# Patient Record
Sex: Female | Born: 1945 | Race: White | Hispanic: No | Smoking: Never smoker
Health system: Southern US, Community
[De-identification: ages and names within clinical notes are randomized; demographics above are authoritative.]

## PROBLEM LIST (undated history)

## (undated) DIAGNOSIS — E785 Hyperlipidemia, unspecified: Secondary | ICD-10-CM

## (undated) DIAGNOSIS — K219 Gastro-esophageal reflux disease without esophagitis: Secondary | ICD-10-CM

## (undated) DIAGNOSIS — I10 Essential (primary) hypertension: Secondary | ICD-10-CM

## (undated) DIAGNOSIS — M810 Age-related osteoporosis without current pathological fracture: Secondary | ICD-10-CM

## (undated) HISTORY — DX: Hyperlipidemia, unspecified: E78.5

## (undated) HISTORY — DX: Gastro-esophageal reflux disease without esophagitis: K21.9

## (undated) HISTORY — DX: Age-related osteoporosis without current pathological fracture: M81.0

---

## 2008-08-15 ENCOUNTER — Encounter: Admission: RE | Admit: 2008-08-15 | Discharge: 2008-10-18 | Payer: Self-pay | Admitting: Orthopedic Surgery

## 2011-09-02 ENCOUNTER — Encounter (HOSPITAL_COMMUNITY): Payer: Self-pay

## 2011-09-02 ENCOUNTER — Emergency Department (HOSPITAL_COMMUNITY): Payer: Medicare Other

## 2011-09-02 ENCOUNTER — Inpatient Hospital Stay (HOSPITAL_COMMUNITY)
Admission: EM | Admit: 2011-09-02 | Discharge: 2011-09-05 | DRG: 470 | Disposition: A | Discharge status: Home Health | Payer: Medicare Other | Attending: Orthopedic Surgery | Admitting: Orthopedic Surgery

## 2011-09-02 ENCOUNTER — Other Ambulatory Visit: Payer: Self-pay

## 2011-09-02 DIAGNOSIS — Y9367 Activity, basketball: Secondary | ICD-10-CM

## 2011-09-02 DIAGNOSIS — I10 Essential (primary) hypertension: Secondary | ICD-10-CM | POA: Diagnosis present

## 2011-09-02 DIAGNOSIS — S72002A Fracture of unspecified part of neck of left femur, initial encounter for closed fracture: Secondary | ICD-10-CM

## 2011-09-02 DIAGNOSIS — Z96649 Presence of unspecified artificial hip joint: Secondary | ICD-10-CM

## 2011-09-02 DIAGNOSIS — W010XXA Fall on same level from slipping, tripping and stumbling without subsequent striking against object, initial encounter: Secondary | ICD-10-CM | POA: Diagnosis present

## 2011-09-02 DIAGNOSIS — D62 Acute posthemorrhagic anemia: Secondary | ICD-10-CM | POA: Diagnosis not present

## 2011-09-02 DIAGNOSIS — E119 Type 2 diabetes mellitus without complications: Secondary | ICD-10-CM | POA: Diagnosis present

## 2011-09-02 DIAGNOSIS — S72009A Fracture of unspecified part of neck of unspecified femur, initial encounter for closed fracture: Principal | ICD-10-CM | POA: Diagnosis present

## 2011-09-02 HISTORY — DX: Essential (primary) hypertension: I10

## 2011-09-02 LAB — BASIC METABOLIC PANEL
BUN: 10 mg/dL (ref 6–23)
CO2: 29 mEq/L (ref 19–32)
Calcium: 9.5 mg/dL (ref 8.4–10.5)
Chloride: 104 mEq/L (ref 96–112)
Creatinine, Ser: 0.58 mg/dL (ref 0.50–1.10)
GFR calc Af Amer: >90 mL/min (ref >90)
GFR calc non Af Amer: >90 mL/min (ref >90)
Glucose, Bld: 161 mg/dL — ABNORMAL HIGH (ref 70–99)
Potassium: 3.2 mEq/L — ABNORMAL LOW (ref 3.5–5.1)
Sodium: 142 mEq/L (ref 135–145)

## 2011-09-02 LAB — CBC
HCT: 36.3 % (ref 36.0–46.0)
Hemoglobin: 12.6 g/dL (ref 12.0–15.0)
MCH: 32.1 pg (ref 26.0–34.0)
MCHC: 34.7 g/dL (ref 30.0–36.0)
MCV: 92.6 fL (ref 78.0–100.0)
Platelets: 147 10*3/uL — ABNORMAL LOW (ref 150–400)
RBC: 3.92 MIL/uL (ref 3.87–5.11)
RDW: 13 % (ref 11.5–15.5)
WBC: 11.9 10*3/uL — ABNORMAL HIGH (ref 4.0–10.5)

## 2011-09-02 MED ORDER — MORPHINE SULFATE 4 MG/ML IJ SOLN
4.0000 mg | Freq: Once | INTRAMUSCULAR | Status: AC
  Administered 2011-09-02: 4 mg via INTRAVENOUS
  Filled 2011-09-02: qty 1

## 2011-09-02 MED ORDER — ONDANSETRON HCL 4 MG/2ML IJ SOLN
4.0000 mg | Freq: Once | INTRAMUSCULAR | Status: AC
  Administered 2011-09-02: 4 mg via INTRAVENOUS
  Filled 2011-09-02: qty 2

## 2011-09-02 NOTE — ED Notes (Signed)
States she was playing basketball with her grandson

## 2011-09-03 ENCOUNTER — Inpatient Hospital Stay (HOSPITAL_COMMUNITY): Payer: Medicare Other

## 2011-09-03 ENCOUNTER — Encounter (HOSPITAL_COMMUNITY): Payer: Self-pay | Admitting: Anesthesiology

## 2011-09-03 ENCOUNTER — Other Ambulatory Visit: Payer: Self-pay

## 2011-09-03 ENCOUNTER — Encounter (HOSPITAL_COMMUNITY)
Admission: EM | Disposition: A | Discharge status: Home Health | Payer: Self-pay | Source: Home / Self Care | Attending: Orthopedic Surgery

## 2011-09-03 ENCOUNTER — Inpatient Hospital Stay (HOSPITAL_COMMUNITY): Payer: Medicare Other | Admitting: Anesthesiology

## 2011-09-03 ENCOUNTER — Encounter (HOSPITAL_COMMUNITY): Payer: Self-pay | Admitting: *Deleted

## 2011-09-03 DIAGNOSIS — Z96649 Presence of unspecified artificial hip joint: Secondary | ICD-10-CM

## 2011-09-03 HISTORY — PX: TOTAL HIP ARTHROPLASTY: SHX124

## 2011-09-03 LAB — GLUCOSE, CAPILLARY
Glucose-Capillary: 215 mg/dL — ABNORMAL HIGH (ref 70–99)
Glucose-Capillary: 256 mg/dL — ABNORMAL HIGH (ref 70–99)
Glucose-Capillary: 412 mg/dL — ABNORMAL HIGH (ref 70–99)

## 2011-09-03 LAB — URINALYSIS, ROUTINE W REFLEX MICROSCOPIC
Glucose, UA: >1000 mg/dL — AB
Leukocytes, UA: NEGATIVE
Protein, ur: NEGATIVE mg/dL
Specific Gravity, Urine: 1.036 — ABNORMAL HIGH (ref 1.005–1.030)
Urobilinogen, UA: 0.2 mg/dL (ref 0.0–1.0)

## 2011-09-03 LAB — COMPREHENSIVE METABOLIC PANEL
ALT: 22 U/L (ref 0–35)
Albumin: 3.6 g/dL (ref 3.5–5.2)
Calcium: 9.5 mg/dL (ref 8.4–10.5)
GFR calc Af Amer: >90 mL/min (ref >90)
Glucose, Bld: 259 mg/dL — ABNORMAL HIGH (ref 70–99)
Potassium: 4.4 mEq/L (ref 3.5–5.1)
Sodium: 140 mEq/L (ref 135–145)
Total Protein: 6.4 g/dL (ref 6.0–8.3)

## 2011-09-03 LAB — SURGICAL PCR SCREEN: MRSA, PCR: NEGATIVE

## 2011-09-03 LAB — ABO/RH
ABO/RH(D): O POS
ABO/RH(D): O POS

## 2011-09-03 LAB — TYPE AND SCREEN
ABO/RH(D): O POS
Antibody Screen: NEGATIVE

## 2011-09-03 LAB — CBC
Hemoglobin: 12.6 g/dL (ref 12.0–15.0)
MCH: 33.2 pg (ref 26.0–34.0)
MCHC: 35.4 g/dL (ref 30.0–36.0)
Platelets: 137 10*3/uL — ABNORMAL LOW (ref 150–400)
RDW: 13 % (ref 11.5–15.5)

## 2011-09-03 SURGERY — ARTHROPLASTY, HIP, TOTAL,POSTERIOR APPROACH
Anesthesia: General | Site: Hip | Laterality: Left | Wound class: Clean

## 2011-09-03 MED ORDER — ACETAMINOPHEN 650 MG RE SUPP: 650.0000 mg | Freq: Four times a day (QID) | RECTAL | Status: DC | PRN

## 2011-09-03 MED ORDER — INSULIN ASPART 100 UNIT/ML ~~LOC~~ SOLN
10.0000 [IU] | Freq: Once | SUBCUTANEOUS | Status: AC
  Administered 2011-09-03: 5 [IU] via SUBCUTANEOUS
  Filled 2011-09-03: qty 3

## 2011-09-03 MED ORDER — METOCLOPRAMIDE HCL 10 MG PO TABS: 5.0000 mg | ORAL_TABLET | Freq: Three times a day (TID) | ORAL | Status: DC | PRN

## 2011-09-03 MED ORDER — METHOCARBAMOL 500 MG PO TABS
500.0000 mg | ORAL_TABLET | Freq: Four times a day (QID) | ORAL | Status: DC | PRN
  Administered 2011-09-04: 500 mg via ORAL
  Filled 2011-09-03: qty 1

## 2011-09-03 MED ORDER — MIDAZOLAM HCL 5 MG/5ML IJ SOLN
INTRAMUSCULAR | Status: DC | PRN
  Administered 2011-09-03: 2 mg via INTRAVENOUS

## 2011-09-03 MED ORDER — FENTANYL CITRATE 0.05 MG/ML IJ SOLN
INTRAMUSCULAR | Status: DC | PRN
  Administered 2011-09-03 (×5): 50 ug via INTRAVENOUS

## 2011-09-03 MED ORDER — METHOCARBAMOL 100 MG/ML IJ SOLN
500.0000 mg | Freq: Four times a day (QID) | INTRAVENOUS | Status: DC | PRN
  Filled 2011-09-03: qty 5

## 2011-09-03 MED ORDER — ACETAMINOPHEN 325 MG PO TABS
650.0000 mg | ORAL_TABLET | Freq: Four times a day (QID) | ORAL | Status: DC | PRN
  Filled 2011-09-03: qty 2

## 2011-09-03 MED ORDER — HETASTARCH-ELECTROLYTES 6 % IV SOLN
INTRAVENOUS | Status: DC | PRN
  Administered 2011-09-03: 15:00:00 via INTRAVENOUS

## 2011-09-03 MED ORDER — SODIUM CHLORIDE 0.9 % IV SOLN
100.0000 mL/h | INTRAVENOUS | Status: DC
  Administered 2011-09-03 – 2011-09-04 (×2): 100 mL/h via INTRAVENOUS
  Filled 2011-09-03 (×9): qty 1000

## 2011-09-03 MED ORDER — LORAZEPAM 0.5 MG PO TABS
0.5000 mg | ORAL_TABLET | Freq: Two times a day (BID) | ORAL | Status: DC
Start: 2011-09-03 — End: 2011-09-05
  Administered 2011-09-03 – 2011-09-04 (×2): 0.5 mg via ORAL
  Filled 2011-09-03 (×2): qty 1

## 2011-09-03 MED ORDER — MENTHOL 3 MG MT LOZG
1.0000 | LOZENGE | OROMUCOSAL | Status: DC | PRN
  Filled 2011-09-03: qty 9

## 2011-09-03 MED ORDER — METOPROLOL TARTRATE 50 MG PO TABS
50.0000 mg | ORAL_TABLET | Freq: Every morning | ORAL | Status: DC
  Administered 2011-09-04 – 2011-09-05 (×2): 50 mg via ORAL
  Filled 2011-09-03 (×3): qty 1

## 2011-09-03 MED ORDER — HYDROMORPHONE HCL PF 1 MG/ML IJ SOLN
INTRAMUSCULAR | Status: AC
  Filled 2011-09-03: qty 1

## 2011-09-03 MED ORDER — CEFAZOLIN SODIUM 1-5 GM-% IV SOLN
1.0000 g | INTRAVENOUS | Status: AC
  Administered 2011-09-03: 1 g via INTRAVENOUS
  Filled 2011-09-03: qty 50

## 2011-09-03 MED ORDER — INSULIN ASPART 100 UNIT/ML ~~LOC~~ SOLN
0.0000 [IU] | Freq: Three times a day (TID) | SUBCUTANEOUS | Status: DC
  Administered 2011-09-03: 9 [IU] via SUBCUTANEOUS
  Administered 2011-09-03: 5 [IU] via SUBCUTANEOUS
  Filled 2011-09-03: qty 3

## 2011-09-03 MED ORDER — FLEET ENEMA 7-19 GM/118ML RE ENEM: 1.0000 | ENEMA | Freq: Once | RECTAL | Status: DC | PRN

## 2011-09-03 MED ORDER — POLYETHYLENE GLYCOL 3350 17 G PO PACK
17.0000 g | PACK | Freq: Two times a day (BID) | ORAL | Status: DC
  Filled 2011-09-03 (×2): qty 1

## 2011-09-03 MED ORDER — HYDROCODONE-ACETAMINOPHEN 5-325 MG PO TABS
1.0000 | ORAL_TABLET | ORAL | Status: DC | PRN
  Administered 2011-09-03: 2 via ORAL
  Filled 2011-09-03: qty 2

## 2011-09-03 MED ORDER — BISACODYL 5 MG PO TBEC: 5.0000 mg | DELAYED_RELEASE_TABLET | Freq: Every day | ORAL | Status: DC | PRN

## 2011-09-03 MED ORDER — ONDANSETRON HCL 4 MG PO TABS: 4.0000 mg | ORAL_TABLET | Freq: Four times a day (QID) | ORAL | Status: DC | PRN

## 2011-09-03 MED ORDER — METHOCARBAMOL 100 MG/ML IJ SOLN: 500.0000 mg | Freq: Four times a day (QID) | INTRAVENOUS | Status: DC | PRN

## 2011-09-03 MED ORDER — SODIUM CHLORIDE 0.9 % IV SOLN
INTRAVENOUS | Status: DC
  Administered 2011-09-03: 08:00:00 via INTRAVENOUS
  Filled 2011-09-03 (×3): qty 1000

## 2011-09-03 MED ORDER — POLYETHYLENE GLYCOL 3350 17 G PO PACK
17.0000 g | PACK | Freq: Two times a day (BID) | ORAL | Status: DC
  Administered 2011-09-03 – 2011-09-05 (×3): 17 g via ORAL
  Filled 2011-09-03 (×7): qty 1

## 2011-09-03 MED ORDER — BENAZEPRIL HCL 20 MG PO TABS
20.0000 mg | ORAL_TABLET | Freq: Every day | ORAL | Status: DC
  Administered 2011-09-03 – 2011-09-05 (×3): 20 mg via ORAL
  Filled 2011-09-03 (×4): qty 1

## 2011-09-03 MED ORDER — HYDROMORPHONE HCL PF 1 MG/ML IJ SOLN: 0.2500 mg | INTRAMUSCULAR | Status: DC | PRN

## 2011-09-03 MED ORDER — MUPIROCIN 2 % EX OINT
TOPICAL_OINTMENT | Freq: Two times a day (BID) | CUTANEOUS | Status: DC
  Administered 2011-09-03 – 2011-09-04 (×3): via NASAL
  Filled 2011-09-03: qty 22

## 2011-09-03 MED ORDER — ZOLPIDEM TARTRATE 5 MG PO TABS: 5.0000 mg | ORAL_TABLET | Freq: Every evening | ORAL | Status: DC | PRN

## 2011-09-03 MED ORDER — ACETAMINOPHEN 10 MG/ML IV SOLN
INTRAVENOUS | Status: DC | PRN
  Administered 2011-09-03: 1000 mg via INTRAVENOUS

## 2011-09-03 MED ORDER — GLYCOPYRROLATE 0.2 MG/ML IJ SOLN
INTRAMUSCULAR | Status: DC | PRN
  Administered 2011-09-03: .5 mg via INTRAVENOUS

## 2011-09-03 MED ORDER — NEOSTIGMINE METHYLSULFATE 1 MG/ML IJ SOLN
INTRAMUSCULAR | Status: DC | PRN
  Administered 2011-09-03: 4 mg via INTRAVENOUS

## 2011-09-03 MED ORDER — HYDROCODONE-ACETAMINOPHEN 5-325 MG PO TABS
1.0000 | ORAL_TABLET | ORAL | Status: DC
  Administered 2011-09-03: 1 via ORAL
  Administered 2011-09-04: 2 via ORAL
  Administered 2011-09-04 (×3): 1 via ORAL
  Administered 2011-09-05 (×3): 2 via ORAL
  Filled 2011-09-03: qty 2
  Filled 2011-09-03 (×2): qty 1
  Filled 2011-09-03 (×3): qty 2
  Filled 2011-09-03 (×2): qty 1

## 2011-09-03 MED ORDER — ROCURONIUM BROMIDE 100 MG/10ML IV SOLN
INTRAVENOUS | Status: DC | PRN
  Administered 2011-09-03: 40 mg via INTRAVENOUS

## 2011-09-03 MED ORDER — ONDANSETRON HCL 4 MG/2ML IJ SOLN
INTRAMUSCULAR | Status: DC | PRN
  Administered 2011-09-03: 4 mg via INTRAVENOUS

## 2011-09-03 MED ORDER — HYDROCHLOROTHIAZIDE 25 MG PO TABS
25.0000 mg | ORAL_TABLET | Freq: Every day | ORAL | Status: DC
  Administered 2011-09-03 – 2011-09-05 (×3): 25 mg via ORAL
  Filled 2011-09-03 (×4): qty 1

## 2011-09-03 MED ORDER — INSULIN ASPART 100 UNIT/ML ~~LOC~~ SOLN: 0.0000 [IU] | Freq: Three times a day (TID) | SUBCUTANEOUS | Status: DC

## 2011-09-03 MED ORDER — LACTATED RINGERS IV SOLN
INTRAVENOUS | Status: DC | PRN
  Administered 2011-09-03 (×2): via INTRAVENOUS

## 2011-09-03 MED ORDER — DOCUSATE SODIUM 100 MG PO CAPS
100.0000 mg | ORAL_CAPSULE | Freq: Two times a day (BID) | ORAL | Status: DC
  Administered 2011-09-03 – 2011-09-05 (×4): 100 mg via ORAL
  Filled 2011-09-03 (×7): qty 1

## 2011-09-03 MED ORDER — DOCUSATE SODIUM 100 MG PO CAPS
100.0000 mg | ORAL_CAPSULE | Freq: Two times a day (BID) | ORAL | Status: DC
  Filled 2011-09-03 (×2): qty 1

## 2011-09-03 MED ORDER — PHENOL 1.4 % MT LIQD
1.0000 | OROMUCOSAL | Status: DC | PRN
  Filled 2011-09-03: qty 177

## 2011-09-03 MED ORDER — HYDROMORPHONE HCL PF 1 MG/ML IJ SOLN: 0.5000 mg | INTRAMUSCULAR | Status: DC | PRN

## 2011-09-03 MED ORDER — FERROUS SULFATE 325 (65 FE) MG PO TABS
325.0000 mg | ORAL_TABLET | Freq: Three times a day (TID) | ORAL | Status: DC
  Administered 2011-09-03 – 2011-09-05 (×6): 325 mg via ORAL
  Filled 2011-09-03 (×10): qty 1

## 2011-09-03 MED ORDER — INSULIN ASPART 100 UNIT/ML ~~LOC~~ SOLN
SUBCUTANEOUS | Status: AC
  Administered 2011-09-03: 5 [IU] via SUBCUTANEOUS
  Filled 2011-09-03: qty 1

## 2011-09-03 MED ORDER — LABETALOL HCL 5 MG/ML IV SOLN
INTRAVENOUS | Status: DC | PRN
  Administered 2011-09-03: 5 mg via INTRAVENOUS

## 2011-09-03 MED ORDER — INSULIN NPH (HUMAN) (ISOPHANE) 100 UNIT/ML ~~LOC~~ SUSP
20.0000 [IU] | Freq: Three times a day (TID) | SUBCUTANEOUS | Status: DC
  Administered 2011-09-04 – 2011-09-05 (×5): 20 [IU] via SUBCUTANEOUS
  Filled 2011-09-03: qty 10

## 2011-09-03 MED ORDER — HYDROCODONE-ACETAMINOPHEN 5-325 MG PO TABS
1.0000 | ORAL_TABLET | ORAL | Status: DC
  Filled 2011-09-03: qty 1

## 2011-09-03 MED ORDER — HYDROMORPHONE HCL 1 MG/ML IJ SOLN: 0.5000 mg | INTRAMUSCULAR | Status: DC | PRN

## 2011-09-03 MED ORDER — CEFAZOLIN SODIUM 1-5 GM-% IV SOLN
1.0000 g | Freq: Four times a day (QID) | INTRAVENOUS | Status: AC
  Administered 2011-09-03 – 2011-09-04 (×3): 1 g via INTRAVENOUS
  Filled 2011-09-03 (×3): qty 50

## 2011-09-03 MED ORDER — PANTOPRAZOLE SODIUM 40 MG PO TBEC
80.0000 mg | DELAYED_RELEASE_TABLET | Freq: Every day | ORAL | Status: DC
  Administered 2011-09-03 – 2011-09-05 (×3): 80 mg via ORAL
  Filled 2011-09-03 (×4): qty 2

## 2011-09-03 MED ORDER — WHITE PETROLATUM GEL
Status: AC
  Filled 2011-09-03: qty 5

## 2011-09-03 MED ORDER — METHOCARBAMOL 500 MG PO TABS: 500.0000 mg | ORAL_TABLET | Freq: Four times a day (QID) | ORAL | Status: DC | PRN

## 2011-09-03 MED ORDER — LIDOCAINE HCL (CARDIAC) 20 MG/ML IV SOLN
INTRAVENOUS | Status: DC | PRN
  Administered 2011-09-03: 100 mg via INTRAVENOUS

## 2011-09-03 MED ORDER — ALUM & MAG HYDROXIDE-SIMETH 200-200-20 MG/5ML PO SUSP: 30.0000 mL | ORAL | Status: DC | PRN

## 2011-09-03 MED ORDER — PROMETHAZINE HCL 25 MG/ML IJ SOLN
12.5000 mg | Freq: Four times a day (QID) | INTRAMUSCULAR | Status: DC | PRN
  Administered 2011-09-03: 12.5 mg via INTRAVENOUS
  Filled 2011-09-03 (×3): qty 1

## 2011-09-03 MED ORDER — FLEET ENEMA 7-19 GM/118ML RE ENEM: 1.0000 | ENEMA | Freq: Once | RECTAL | Status: AC | PRN

## 2011-09-03 MED ORDER — PROPOFOL 10 MG/ML IV EMUL
INTRAVENOUS | Status: DC | PRN
  Administered 2011-09-03: 150 mg via INTRAVENOUS

## 2011-09-03 MED ORDER — ENOXAPARIN SODIUM 40 MG/0.4ML ~~LOC~~ SOLN
40.0000 mg | SUBCUTANEOUS | Status: DC
  Administered 2011-09-04 – 2011-09-05 (×2): 40 mg via SUBCUTANEOUS
  Filled 2011-09-03 (×3): qty 0.4

## 2011-09-03 MED ORDER — METOCLOPRAMIDE HCL 5 MG/ML IJ SOLN: 5.0000 mg | Freq: Three times a day (TID) | INTRAMUSCULAR | Status: DC | PRN

## 2011-09-03 MED ORDER — ONDANSETRON HCL 4 MG/2ML IJ SOLN: 4.0000 mg | Freq: Four times a day (QID) | INTRAMUSCULAR | Status: DC | PRN

## 2011-09-03 MED ORDER — DIPHENHYDRAMINE HCL 25 MG PO CAPS: 25.0000 mg | ORAL_CAPSULE | Freq: Four times a day (QID) | ORAL | Status: DC | PRN

## 2011-09-03 SURGICAL SUPPLY — 44 items
ADH SKN CLS APL DERMABOND .7 (GAUZE/BANDAGES/DRESSINGS) ×1
BAG SPEC THK2 15X12 ZIP CLS (MISCELLANEOUS) ×1
BAG ZIPLOCK 12X15 (MISCELLANEOUS) ×2 IMPLANT
BLADE SAW SGTL 18X1.27X75 (BLADE) ×2 IMPLANT
CLOTH BEACON ORANGE TIMEOUT ST (SAFETY) ×2 IMPLANT
DERMABOND ADVANCED (GAUZE/BANDAGES/DRESSINGS) ×1
DERMABOND ADVANCED .7 DNX12 (GAUZE/BANDAGES/DRESSINGS) ×1 IMPLANT
DRAPE INCISE IOBAN 85X60 (DRAPES) ×2 IMPLANT
DRAPE ORTHO SPLIT 77X108 STRL (DRAPES) ×4
DRAPE POUCH INSTRU U-SHP 10X18 (DRAPES) ×2 IMPLANT
DRAPE SURG 17X11 SM STRL (DRAPES) ×2 IMPLANT
DRAPE SURG ORHT 6 SPLT 77X108 (DRAPES) ×2 IMPLANT
DRAPE U-SHAPE 47X51 STRL (DRAPES) ×2 IMPLANT
DRSG AQUACEL AG ADV 3.5X10 (GAUZE/BANDAGES/DRESSINGS) ×2 IMPLANT
DRSG TEGADERM 4X4.75 (GAUZE/BANDAGES/DRESSINGS) ×2 IMPLANT
DURAPREP 26ML APPLICATOR (WOUND CARE) ×2 IMPLANT
ELECT BLADE TIP CTD 4 INCH (ELECTRODE) ×2 IMPLANT
ELECT REM PT RETURN 9FT ADLT (ELECTROSURGICAL) ×2
ELECTRODE REM PT RTRN 9FT ADLT (ELECTROSURGICAL) ×1 IMPLANT
EVACUATOR 1/8 PVC DRAIN (DRAIN) ×2 IMPLANT
FACESHIELD LNG OPTICON STERILE (SAFETY) ×8 IMPLANT
GAUZE SPONGE 2X2 8PLY STRL LF (GAUZE/BANDAGES/DRESSINGS) ×1 IMPLANT
GLOVE BIOGEL PI IND STRL 7.5 (GLOVE) ×1 IMPLANT
GLOVE BIOGEL PI IND STRL 8 (GLOVE) ×1 IMPLANT
GLOVE BIOGEL PI INDICATOR 7.5 (GLOVE) ×1
GLOVE BIOGEL PI INDICATOR 8 (GLOVE) ×1
GLOVE ECLIPSE 8.0 STRL XLNG CF (GLOVE) ×2 IMPLANT
GLOVE ORTHO TXT STRL SZ7.5 (GLOVE) ×4 IMPLANT
GOWN BRE IMP PREV XXLGXLNG (GOWN DISPOSABLE) ×2 IMPLANT
GOWN STRL NON-REIN LRG LVL3 (GOWN DISPOSABLE) ×2 IMPLANT
KIT BASIN OR (CUSTOM PROCEDURE TRAY) ×2 IMPLANT
MANIFOLD NEPTUNE II (INSTRUMENTS) ×2 IMPLANT
NS IRRIG 1000ML POUR BTL (IV SOLUTION) ×2 IMPLANT
PACK TOTAL JOINT (CUSTOM PROCEDURE TRAY) ×2 IMPLANT
POSITIONER SURGICAL ARM (MISCELLANEOUS) ×2 IMPLANT
SPONGE GAUZE 2X2 STER 10/PKG (GAUZE/BANDAGES/DRESSINGS) ×1
SUCTION FRAZIER TIP 10 FR DISP (SUCTIONS) ×2 IMPLANT
SUT MNCRL AB 4-0 PS2 18 (SUTURE) ×2 IMPLANT
SUT VIC AB 1 CT1 36 (SUTURE) ×6 IMPLANT
SUT VIC AB 2-0 CT1 27 (SUTURE) ×4
SUT VIC AB 2-0 CT1 TAPERPNT 27 (SUTURE) ×2 IMPLANT
TOWEL OR 17X26 10 PK STRL BLUE (TOWEL DISPOSABLE) ×4 IMPLANT
TRAY FOLEY CATH 14FRSI W/METER (CATHETERS) ×2 IMPLANT
WATER STERILE IRR 1500ML POUR (IV SOLUTION) ×2 IMPLANT

## 2011-09-03 NOTE — Progress Notes (Signed)
Inpatient Diabetes Program Recommendations  AACE/ADA: New Consensus Statement on Inpatient Glycemic Control (2009)  Target Ranges:  Prepandial:   less than 140 mg/dL      Peak postprandial:   less than 180 mg/dL (1-2 hours)      Critically ill patients:  140 - 180 mg/dL  Results for Susan Petty, Susan Petty (MRN [id number]) as of 09/03/2011 11:21  Ref. Range 09/03/2011 00:50 09/03/2011 08:58  Glucose-Capillary Latest Range: 70-99 mg/dL 846 (H) 962 (H)    Inpatient Diabetes Program Recommendations Insulin - Basal: Lantus 15 units  Correction (SSI): change to q 4 while NPO  Note: Patient takes intermediate acting NPH TID at home.  Needs basal insulin.  Will continue to follow during this admission. Thank you  Piedad Climes RN,BSN,CDE Inpatient Diabetes Coordinator

## 2011-09-03 NOTE — Anesthesia Preprocedure Evaluation (Signed)
Anesthesia Evaluation  Patient identified by MRN, date of birth, ID band Patient awake    Reviewed: Allergy & Precautions, H&P , NPO status , Patient's Chart, lab work & pertinent test results  Airway Mallampati: II TM Distance: >3 FB Neck ROM: Full    Dental No notable dental hx.    Pulmonary neg pulmonary ROS,  clear to auscultation  Pulmonary exam normal       Cardiovascular hypertension, Pt. on home beta blockers and Pt. on medications Regular Normal    Neuro/Psych Negative Neurological ROS  Negative Psych ROS   GI/Hepatic negative GI ROS, Neg liver ROS,   Endo/Other  Negative Endocrine ROSDiabetes mellitus-, Type 1, Insulin Dependent  Renal/GU negative Renal ROS  Genitourinary negative   Musculoskeletal negative musculoskeletal ROS (+)   Abdominal   Peds negative pediatric ROS (+)  Hematology negative hematology ROS (+)   Anesthesia Other Findings   Reproductive/Obstetrics negative OB ROS                           Anesthesia Physical Anesthesia Plan  ASA: III  Anesthesia Plan: General   Post-op Pain Management:    Induction: Intravenous  Airway Management Planned: Oral ETT  Additional Equipment:   Intra-op Plan:   Post-operative Plan: Extubation in OR  Informed Consent: I have reviewed the patients History and Physical, chart, labs and discussed the procedure including the risks, benefits and alternatives for the proposed anesthesia with the patient or authorized representative who has indicated his/her understanding and acceptance.   Dental advisory given  Plan Discussed with: CRNA  Anesthesia Plan Comments:         Anesthesia Quick Evaluation

## 2011-09-03 NOTE — Progress Notes (Signed)
Dr Charlann Boxer called about bl sugar of 407 stated to cover pt c highest dose on sliding scale no state lab needed.

## 2011-09-03 NOTE — Progress Notes (Signed)
Subjective:  Status unchanged, pt still complaining of left hip pain. Pt scheduled for left THA later today. Pt aware of risks, benefits and expectations of the procedure and wishes to proceed.   Objective:  VITALS:   Filed Vitals:   09/03/11 1202  BP: 134/42  Pulse: 96  Temp: 99.1 F (37.3 C)  Resp: 16   LABS  Basename 09/03/11 0150 09/02/11 2205  HGB 12.6 12.6  HCT 35.6* 36.3  WBC 11.6* 11.9*  PLT 137* 147*     Basename 09/03/11 0150 09/02/11 2205  NA 140 142  K 4.4 3.2*  BUN 11 10  CREATININE 0.51 0.58  GLUCOSE 259* 161*     Assessment/Plan: Day of Surgery Procedure(s) (LRB): TOTAL HIP ARTHROPLASTY (Left)  Continue plan with left total hip arthroplasty, posterior approach later today, 09/03/2011 per Dr. Charlann Boxer at [facility].  Anastasio Auerbach. Jahid Weida   PAC  09/03/2011, 1:06 PM

## 2011-09-03 NOTE — Op Note (Signed)
NAME:Susan Petty  MEDICAL RECORD JW.:[id number]   LOCATION: 1602 FACILITY: [facility]   DATE OF BIRTH: March 03, 1946   PHYSICIAN: Madlyn Frankel. Charlann Boxer, M.D.    DATE OF PROCEDURE: 09/03/2011     OPERATIVE REPORT:   PREOPERATIVE DIAGNOSIS: Left hip displaced femoral neck fracture   POSTOPERATIVE DIAGNOSIS:  Left hip displaced femoral neck fracture.   PROCEDURE: Left total hip replacement utilizing DePuy components size  52mm Pinnacle cup, 36+4 neutral Altrx liner, a size 4 standard Tri-Lock stem  with a 36+1.5 delta ceramic ball.   SURGEON: Madlyn Frankel. Charlann Boxer, M.D.   ASSISTANT: Lanney Gins , PA-C.  Please note that physician assistant, Lanney Gins, was present for the entire case from  preoperative positioning to perioperative retractive management, general  facilitation and management of the operative extremity during the case  as well as general facilitation of the case and primary wound closure.   ANESTHESIA: General   SPECIMENS: None.   COMPLICATIONS: None.   ESTIMATED BLOOD LOSS: 700cc.   DRAINS: One Hemovac.   INDICATION FOR PROCEDURE: The patient is a 66 y.o. @gender @ patient of mine  with history of displaced left femoral neck fracture.  After reviewing the  risks infection, DVT, dislocation, and need for revision surgeries, consent was  obtained for benefit of pain relief comparing the pros and cons of hip hemiarthroplasty versus total hip arthroplasty.   PROCEDURE IN DETAIL: The patient was brought to the operative theater.  Once adequate anesthesia, preop antibiotics, 2gm Ancef administered, the  patient was positioned in the right lateral decubitus position with the  left side up using the peg board positioner.  The left lower extremity was pre-scrubbed and then prepped and draped in  sterile fashion.  Time-out was performed identifying the patient,  planned procedure, and extremity.   A lateral incision was made based off the proximal trochanter. Sharp  dissection was carried down to the level of the iliotibial band and gluteal fascia.  The iliotibial band and gluteal fascia were then opened through a  posterior approach in the hip.  The posterior aspect of the hip was exposed. The short external  rotators were taken down separate from the posterior capsule which was  preserved to help protect the sciatic nerve from retractors, and also to be potentially repaired at the end of the procedure. Following this, an L capsulotomy was made referenced off the superior femoral neck.  The hip was then dislocated. A femoral neck osteotomy was made based on anatomic landmarks and pre-operative templating.  The femoral head was removed and advanced degenerative changes were noted on both the femoral head and acetabulum.   Femoral preparation was begun, retractors were placed and the proximal  femur was opened with the starting drill, I then hand reamed once and irrigated to  try to prevent fat emboli.  I began broaching with 0 broach and broached up initially to a size 4  broach. I packed off the femur with a sponge and then directed my attention to the acetabulum.  Acetabular retractors were placed. The labrum and foveal tissue was debrided. In  addition, there was a portion of the superior capsule removed to allow  for exposure of the cup.  I then began reaming with a 45mm reamer and reamed up  to a 51 reamer with excellent bony bed preparation.  A 52 Pinnacle cup was  chosen. The cup was subsequently impacted with good initial  scratch fit fixation.  1 screw was placed.  The hole eliminator  was placed and the final 36+4 neutral liner was impacted  with good visualized rim fit.   Please note that I did assess the cup position using the anatomic  landmarks present including acetabular rim as well as my hip guide  indicating position about 20 degrees of forward flexion and 35-40  degrees of abduction.   At this point, I turned to the femur. The size 4  broach was placed in the femur.  Based on the pre-operative radiographs and templating, I chose a standard  offset neck. A trial reduction was now carried out with a standard offset  neck and 36+1.5 head. On inspection of range of motion, there was no  evidence of impingement or subluxation. Leg lengths appeared to be  equal to the contralateral leg as I compare them to the preoperative  state. Given all these findings, the trial components were removed.  The final 4 standard Tri-Lock stem was chosen. After irrigating the femoral  canal, the final stem was impacted and sat at the level of the final broach. Based on this and the trial reduction, a 36+1.5 delta  ceramic ball was chosen, impacted onto a clean dry trunnion.   At this point, the hip was reduced. The hip had been irrigated  throughout the case and again at this point. We reapproximated the capsule to itself using a #1 vicryl.  I placed a medium Hemovac drain deep. The iliotibial band and gluteal  fascia were then reapproximated using #1 Vicryl. The remaining wound  was closed with 2-0 Vicryl and running 4-0 Monocryl.   The hip was cleaned, dried, and dressed sterilely using Dermabond and Aquacel  dressing. The patient's drain site dressed separately. The patient was  brought to the recovery room in stable condition, tolerating the  procedure well.     Madlyn Frankel Charlann Boxer, M.D.

## 2011-09-03 NOTE — Preoperative (Addendum)
Beta Blockers   Reason not to administer Beta Blockers:Hold beta blocker due to other Pt took Metoprolol 09-02-11 in AM-to hold Beta Blocker for now (possible hemodynamic instability), will dose intra-op as needed

## 2011-09-03 NOTE — Transfer of Care (Signed)
Immediate Anesthesia Transfer of Care Note  Patient: Susan Petty  Procedure(s) Performed: Procedure(s) (LRB): TOTAL HIP ARTHROPLASTY (Left)  Patient Location: PACU  Anesthesia Type: General  Level of Consciousness: awake, alert  and patient cooperative  Airway & Oxygen Therapy: Patient Spontanous Breathing and Patient connected to face mask oxygen  Post-op Assessment: Report given to PACU RN and Post -op Vital signs reviewed and stable  Post vital signs: Reviewed and stable  Complications: No apparent anesthesia complications

## 2011-09-03 NOTE — Progress Notes (Signed)
Patient ID: Susan Petty, female   DOB: Dec 21, 1945, 66 y.o.   MRN: [id number] Susan Petty is an 66 y.o. female.    Chief Complaint:  Left hip pain   HPI: Pt is a 66 y.o. female complaining of left hip pain.  Ms Crock was playing basketball with her grandson this evening when she tripped and fell.  Immediate onset left hip pain.  Brought to [facility]  ER where initial work up including Xrays revealed left femoral neck fracture.  Orthopaedics was consulted for admission and definitive management.  No other complaints.  No black out, no chest pain.     PCP:  Sheila Oats, MD, MD  D/C Plans: To be determined following appropriate treatment plan  PMH: Past Medical History  Diagnosis Date  . Hypertension   . Diabetes mellitus     PSH: History reviewed. No pertinent past surgical history.  Social History:  reports that she has never smoked. She has never used smokeless tobacco. She reports that she drinks alcohol. She reports that she does not use illicit drugs.  Allergies:  Allergies  Allergen Reactions  . Sulfa Drugs Cross Reactors Rash    Medications: Medications Prior to Admission  Medication Dose Route Frequency Provider Last Rate Last Dose  . morphine 4 MG/ML injection 4 mg  4 mg Intravenous Once [name], MD   4 mg at 09/02/11 2314  . ondansetron (ZOFRAN) injection 4 mg  4 mg Intravenous Once [name], PA   4 mg at 09/02/11 2259   No current outpatient prescriptions on file as of 09/02/2011.    Results for orders placed during the hospital encounter of 09/02/11 (from the past 48 hour(s))  CBC     Status: Abnormal   Collection Time   09/02/11 10:05 PM      Component Value Range Comment   WBC 11.9 (*) 4.0 - 10.5 (K/uL)    RBC 3.92  3.87 - 5.11 (MIL/uL)    Hemoglobin 12.6  12.0 - 15.0 (g/dL)    HCT 40.9  81.1 - 91.4 (%)    MCV 92.6  78.0 - 100.0 (fL)    MCH 32.1  26.0 - 34.0 (pg)    MCHC 34.7  30.0 - 36.0 (g/dL)    RDW 78.2  95.6 - 21.3 (%)    Platelets 147 (*) 150 - 400 (K/uL)   BASIC METABOLIC PANEL     Status: Abnormal   Collection Time   09/02/11 10:05 PM      Component Value Range Comment   Sodium 142  135 - 145 (mEq/L)    Potassium 3.2 (*) 3.5 - 5.1 (mEq/L)    Chloride 104  96 - 112 (mEq/L)    CO2 29  19 - 32 (mEq/L)    Glucose, Bld 161 (*) 70 - 99 (mg/dL)    BUN 10  6 - 23 (mg/dL)    Creatinine, Ser 0.86  0.50 - 1.10 (mg/dL)    Calcium 9.5  8.4 - 10.5 (mg/dL)    GFR calc non Af Amer >90  >90 (mL/min)    GFR calc Af Amer >90  >90 (mL/min)    Dg Chest 2 View  09/02/2011  *RADIOLOGY REPORT*  Clinical Data: Fell, femur fracture  CHEST - 2 VIEW  Comparison: None.  Findings: Mild cardiomegaly.  Lungs clear.  No effusion.  Regional bones unremarkable.  IMPRESSION:  1.  Mild cardiomegaly.  Original Report Authenticated By: Osa Craver, M.D.   Dg Hip  Complete Left  09/02/2011  *RADIOLOGY REPORT*  Clinical Data: Pain post fall.  LEFT HIP - COMPLETE 2+ VIEW  Comparison: None.  Findings: There is a transverse mid-cervical fracture, impacted with a mild varus deformity and some foreshortening.  No significant osseous degenerative change.  IMPRESSION:  1.  Left femoral neck fracture as above.  Per CMS PQRS reporting requirements (PQRS Measure 24): Given the patient's age of greater than 50 and the fracture site (hip, distal radius, or spine), the patient should be tested for osteoporosis using DXA, and the appropriate treatment considered based on the DXA results.  Original Report Authenticated By: Osa Craver, M.D.    ROS: Review of Systems - General ROS: negative for - chills, fatigue, fever, malaise, night sweats, sleep disturbance or weight loss  Physican Exam: Blood pressure 174/57, pulse 88, temperature 98.1 F (36.7 C), temperature source Oral, resp. rate 18, height 5\' 5"  (1.651 m), weight 53.524 kg (118 lb), SpO2 90.00%. .physicalexam  Assessment/Plan Assessment:  Left displaced femoral neck fracture    Plan: Patient will undergo a left total hip replacement for management of her hip fracture based on her age. Risks benefits and expectation were discussed with the patient. Patient understand risks, benefits and expectation and wishes to proceed.  She may snack for the time being but then will be NPO for surgery tomorrow.    I will be transferring her to [facility] to perform the surgery after/during my elective schedule.  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 Charlann Boxer, MD  09/03/2011, 12:10 AM

## 2011-09-03 NOTE — ED Provider Notes (Signed)
History    66 year old female transferred from an outside facility. Transfer was based on patient request. Patient was diagnosed with a left hip fracture. Surgical neck of her left femur. This happened while she was playing basketball for grandchildren. Patient denies numbness or tingling. Denies any other associated injuries. Patient has a history of hypertension and diabetes. She's not on any blood thinning medication. Patient has not had prior orthopedic surgery  CSN: [id number]  Arrival date & time 09/02/11  2115   First MD Initiated Contact with Patient 09/02/11 2118      Chief Complaint  Patient presents with  . Hip Injury    (Consider location/radiation/quality/duration/timing/severity/associated sxs/prior treatment) HPI  Past Medical History  Diagnosis Date  . Hypertension   . Diabetes mellitus     History reviewed. No pertinent past surgical history.  Family History  Problem Relation Age of Onset  . Hypertension Mother   . Hypertension Father   . Hypertension Sister   . Diabetes Brother     History  Substance Use Topics  . Smoking status: Never Smoker   . Smokeless tobacco: Never Used  . Alcohol Use: Yes    OB History    Grav Para Term Preterm Abortions TAB SAB Ect Mult Living                  Review of Systems   Review of symptoms negative unless otherwise noted in HPI.   Allergies  Sulfa drugs cross reactors  Home Medications   Current Outpatient Rx  Name Route Sig Dispense Refill  . BENAZEPRIL HCL 20 MG PO TABS Oral Take 20 mg by mouth daily.    Marland Kitchen HYDROCHLOROTHIAZIDE 25 MG PO TABS Oral Take 25 mg by mouth daily.    . INSULIN ISOPHANE HUMAN 100 UNIT/ML [place] SUSP Subcutaneous Inject 20 Units into the skin 3 (three) times daily with meals.    Marland Kitchen LORAZEPAM 0.5 MG PO TABS Oral Take 0.5 mg by mouth 2 (two) times daily.    Marland Kitchen METOPROLOL TARTRATE 50 MG PO TABS Oral Take 50 mg by mouth every morning.    Marland Kitchen OMEPRAZOLE 40 MG PO CPDR Oral Take 40 mg by mouth  daily.      BP 159/52  Pulse 78  Temp(Src) 98.1 F (36.7 C) (Oral)  Resp 14  Ht 5\' 5"  (1.651 m)  Wt 118 lb (53.524 kg)  BMI 19.64 kg/m2  SpO2 89%  Physical Exam  Nursing note and vitals reviewed. Constitutional: She appears well-developed and well-nourished. No distress.  HENT:  Head: Normocephalic and atraumatic.  Eyes: Conjunctivae are normal. Right eye exhibits no discharge. Left eye exhibits no discharge.  Neck: Neck supple.  Cardiovascular: Normal rate, regular rhythm and normal heart sounds.  Exam reveals no gallop and no friction rub.   No murmur heard. Pulmonary/Chest: Effort normal and breath sounds normal. No respiratory distress.  Abdominal: Soft. She exhibits no distension. There is no tenderness.  Musculoskeletal: She exhibits no edema and no tenderness.       Left leg  minimally shortened. Patient cannot range hip secondary to pain. Pelvis is stable. There is tenderness diffusely in the left hip area. Overlying skin is grossly normal. She is neurovascular intact distally.  Neurological: She is alert.  Skin: Skin is warm and dry.  Psychiatric: She has a normal mood and affect. Her behavior is normal. Thought content normal.    ED Course  Procedures (including critical care time)  Labs Reviewed  CBC - Abnormal; Notable  for the following:    WBC 11.9 (*)    Platelets 147 (*)    All other components within normal limits  BASIC METABOLIC PANEL - Abnormal; Notable for the following:    Potassium 3.2 (*)    Glucose, Bld 161 (*)    All other components within normal limits   Dg Chest 2 View  09/02/2011  *RADIOLOGY REPORT*  Clinical Data: Larey Seat, femur fracture  CHEST - 2 VIEW  Comparison: None.  Findings: Mild cardiomegaly.  Lungs clear.  No effusion.  Regional bones unremarkable.  IMPRESSION:  1.  Mild cardiomegaly.  Original Report Authenticated By: Osa Craver, M.D.   Dg Hip Complete Left  09/02/2011  *RADIOLOGY REPORT*  Clinical Data: Pain post fall.   LEFT HIP - COMPLETE 2+ VIEW  Comparison: None.  Findings: There is a transverse mid-cervical fracture, impacted with a mild varus deformity and some foreshortening.  No significant osseous degenerative change.  IMPRESSION:  1.  Left femoral neck fracture as above.  Per CMS PQRS reporting requirements (PQRS Measure 24): Given the patient's age of greater than 50 and the fracture site (hip, distal radius, or spine), the patient should be tested for osteoporosis using DXA, and the appropriate treatment considered based on the DXA results.  Original Report Authenticated By: [name], M.D.   EKG:  Rhythm: Normal sinus rhythm Rate: 24 Axis: Normal axis Intervals: Normal ST segments: Nonspecific ST changes. There is mild T-wave flattening anteriorly. Comparison: None   1. Displaced fracture of left femoral neck       MDM  66 year old female with hip fracture. Surgical neck of the left femur. This is a closed injury. There are no other associated injuries. Case was discussed with Dr. Charlann Boxer, orthopedic surgery.  Admission.        [name], MD 09/03/11 [phone]

## 2011-09-03 NOTE — Anesthesia Postprocedure Evaluation (Signed)
  Anesthesia Post-op Note  Patient: Susan Petty  Procedure(s) Performed: Procedure(s) (LRB): TOTAL HIP ARTHROPLASTY (Left)  Patient Location: PACU  Anesthesia Type: General  Level of Consciousness: awake and alert   Airway and Oxygen Therapy: Patient Spontanous Breathing  Post-op Pain: mild  Post-op Assessment: Post-op Vital signs reviewed, Patient's Cardiovascular Status Stable, Respiratory Function Stable, Patent Airway and No signs of Nausea or vomiting  Post-op Vital Signs: stable  Complications: No apparent anesthesia complications

## 2011-09-04 LAB — HEMOGLOBIN A1C
Hgb A1c MFr Bld: 8.8 % — ABNORMAL HIGH (ref <5.7)
Mean Plasma Glucose: 206 mg/dL — ABNORMAL HIGH (ref <117)

## 2011-09-04 LAB — BASIC METABOLIC PANEL
BUN: 27 mg/dL — ABNORMAL HIGH (ref 6–23)
CO2: 24 mEq/L (ref 19–32)
Chloride: 104 mEq/L (ref 96–112)
Creatinine, Ser: 0.92 mg/dL (ref 0.50–1.10)
Potassium: 4.1 mEq/L (ref 3.5–5.1)

## 2011-09-04 LAB — CBC
HCT: 22.5 % — ABNORMAL LOW (ref 36.0–46.0)
MCV: 95.3 fL (ref 78.0–100.0)
Platelets: 114 10*3/uL — ABNORMAL LOW (ref 150–400)
RBC: 2.36 MIL/uL — ABNORMAL LOW (ref 3.87–5.11)
WBC: 8.8 10*3/uL (ref 4.0–10.5)

## 2011-09-04 LAB — GLUCOSE, RANDOM
Glucose, Bld: 506 mg/dL — ABNORMAL HIGH (ref 70–99)
Glucose, Bld: 548 mg/dL — ABNORMAL HIGH (ref 70–99)

## 2011-09-04 LAB — GLUCOSE, CAPILLARY: Glucose-Capillary: 459 mg/dL — ABNORMAL HIGH (ref 70–99)

## 2011-09-04 LAB — PREPARE RBC (CROSSMATCH)

## 2011-09-04 MED ORDER — DEXTROSE 50 % IV SOLN
INTRAVENOUS | Status: AC
  Administered 2011-09-04: 25 mL
  Filled 2011-09-04: qty 50

## 2011-09-04 MED ORDER — FUROSEMIDE 10 MG/ML IJ SOLN
10.0000 mg | Freq: Once | INTRAMUSCULAR | Status: AC
  Administered 2011-09-04: 14:00:00 via INTRAVENOUS
  Filled 2011-09-04: qty 1

## 2011-09-04 MED ORDER — INSULIN ASPART 100 UNIT/ML ~~LOC~~ SOLN
0.0000 [IU] | Freq: Three times a day (TID) | SUBCUTANEOUS | Status: DC
  Administered 2011-09-04: 20 [IU] via SUBCUTANEOUS
  Administered 2011-09-04: 7 [IU] via SUBCUTANEOUS
  Administered 2011-09-04: 15 [IU] via SUBCUTANEOUS
  Administered 2011-09-05: 4 [IU] via SUBCUTANEOUS
  Filled 2011-09-04: qty 3

## 2011-09-04 MED ORDER — FUROSEMIDE 10 MG/ML IJ SOLN
10.0000 mg | Freq: Once | INTRAMUSCULAR | Status: AC
  Administered 2011-09-04: 10 mg via INTRAVENOUS
  Filled 2011-09-04: qty 1

## 2011-09-04 NOTE — Progress Notes (Signed)
CSW consulted for SNF placement. CSW met with pt this am to assist with d/c planning. Pt plans to go home following hospitalization. RNCM is assisting with [facility] services.

## 2011-09-04 NOTE — Progress Notes (Signed)
Subjective: 1 Day Post-Op Procedure(s) (LRB): TOTAL HIP ARTHROPLASTY (Left)   Patient reports pain as mild. Pt blood sugar elevate and H&H low. Otherwise no events.  Objective:   VITALS:   Filed Vitals:   09/04/11 0555  BP: 110/64  Pulse: 84  Temp: 98.4 F (36.9 C)  Resp: 16    Neurovascular intact Dorsiflexion/Plantar flexion intact Incision: dressing C/D/I No cellulitis present Compartment soft  LABS  Basename 09/04/11 0400 09/03/11 0150 09/02/11 2205  HGB 7.7* 12.6 12.6  HCT 22.5* 35.6* 36.3  WBC 8.8 11.6* 11.9*  PLT 114* 137* 147*     Basename 09/04/11 0400 09/04/11 0005 09/03/11 0150 09/02/11 2205  NA 137 -- 140 142  K 4.1 -- 4.4 3.2*  BUN 27* -- 11 10  CREATININE 0.92 -- 0.51 0.58  GLUCOSE 386* 506* 259* --     Assessment/Plan: 1 Day Post-Op Procedure(s) (LRB): TOTAL HIP ARTHROPLASTY (Left)   Foley cath d/c'ed HV drain d/c'ed Advance diet Up with therapy D/C IV fluids Discharge home with home health upon discharge H&H low - transfuse 2 units of blood Hyperglycemic - change to higher sliding scale of insulin   Susan Petty. Susan Petty   PAC  09/04/2011, 8:22 AM

## 2011-09-04 NOTE — Progress Notes (Signed)
Physical Therapy Treatment Patient Details Name: Susan Petty MRN: [id number] DOB: [birth year] Today's Date: 09/04/2011  PT Assessment/Plan  PT - Assessment/Plan Comments on Treatment Session: pt continues to appear doppy, requires vc for safety and precautions for hip. PT Frequency: 7X/week Recommendations for Other Services: OT consult Follow Up Recommendations: Home health PT Equipment Recommended: Rolling walker with 5" wheels PT Goals  Acute Rehab PT Goals PT Goal Formulation: With patient/family Time For Goal Achievement: 7 days Pt will go Supine/Side to Sit: with supervision;with HOB 0 degrees PT Goal: Supine/Side to Sit - Progress: Goal set today Pt will go Sit to Supine/Side: with supervision;with HOB 0 degrees PT Goal: Sit to Supine/Side - Progress: Progressing toward goal Pt will go Sit to Stand: with supervision;with upper extremity assist PT Goal: Sit to Stand - Progress: Progressing toward goal Pt will go Stand to Sit: with supervision PT Goal: Stand to Sit - Progress: Progressing toward goal Pt will Ambulate: 51 - 150 feet;with supervision;with rolling walker PT Goal: Ambulate - Progress: Goal set today Pt will Go Up / Down Stairs: 3-5 stairs;with min assist;with rolling walker PT Goal: Up/Down Stairs - Progress: Goal set today Additional Goals Additional Goal #1: demo/state posterior hip precasutions PT Goal: Additional Goal #1 - Progress: Goal set today  PT Treatment Precautions/Restrictions  Precautions Precautions: Posterior Hip Precaution Comments: reviewed w/ pt and daughter Required Braces or Orthoses: No Restrictions Weight Bearing Restrictions: No Other Position/Activity Restrictions: wbat Mobility (including Balance) Bed Mobility Bed Mobility: Yes Supine to Sit: 4: Min assist;HOB elevated (Comment degrees);With rails Supine to Sit Details (indicate cue type and reason): cues for hand placement. Sitting - Scoot to Edge of Bed: 4: Min assist Sit  to Supine: 4: Min assist;HOB flat Sit to Supine - Details (indicate cue type and reason): vc for posterior precautions, Transfers Sit to Stand: 4: Min assist;From chair/3-in-1;With upper extremity assist;With armrests Sit to Stand Details (indicate cue type and reason): cues for hand placement, to push off Stand to Sit: 4: Min assist;With upper extremity assist;To chair/3-in-1;With armrests Stand to Sit Details: cues for hand placement Stand Pivot Transfers: 4: Min assist Stand Pivot Transfer Details (indicate cue type and reason): vc to stay within RW, use UEs, pt tends to step out of RW    Exercise    End of Session PT - End of Session Equipment Utilized During Treatment: Gait belt Activity Tolerance: Patient limited by fatigue Patient left: with family/visitor present;in bed Nurse Communication: Mobility status for transfers General Behavior During Session: [facility] for tasks performed Cognition: [facility] for tasks performed  Rada Hay 09/04/2011, 4:38 PM

## 2011-09-04 NOTE — Evaluation (Signed)
Physical Therapy Evaluation Patient Details Name: Susan Petty MRN: [id number] DOB: 12/29/1945 Today's Date: 09/04/2011  Problem List:  Patient Active Problem List  Diagnoses  . S/P left THA, s/p hip fx    Past Medical History:  Past Medical History  Diagnosis Date  . Hypertension   . Diabetes mellitus    Past Surgical History: History reviewed. No pertinent past surgical history.  PT Assessment/Plan/Recommendation PT Assessment Clinical Impression Statement: pt tolerated well considering no activity in 2 days.  Pt will benefit from PT  for ROM, strength, mobility to return to home with family support. PT Recommendation/Assessment: Patient will need skilled PT in the acute care venue PT Problem List: Decreased strength;Decreased range of motion;Decreased activity tolerance;Decreased knowledge of use of DME;Decreased safety awareness;Decreased knowledge of precautions;Pain PT Therapy Diagnosis : Difficulty walking;Acute pain PT Plan PT Frequency: 7X/week PT Treatment/Interventions: DME instruction;Gait training;Stair training;Functional mobility training;Therapeutic activities;Therapeutic exercise;Patient/family education PT Recommendation Recommendations for Other Services: OT consult Follow Up Recommendations: Home health PT Equipment Recommended: Rolling walker with 5" wheels PT Goals  Acute Rehab PT Goals PT Goal Formulation: With patient/family Time For Goal Achievement: 7 days Pt will go Supine/Side to Sit: with supervision;with HOB 0 degrees PT Goal: Supine/Side to Sit - Progress: Goal set today Pt will go Sit to Supine/Side: with supervision;with HOB 0 degrees PT Goal: Sit to Supine/Side - Progress: Goal set today Pt will go Sit to Stand: with supervision;with upper extremity assist PT Goal: Sit to Stand - Progress: Goal set today Pt will go Stand to Sit: with supervision PT Goal: Stand to Sit - Progress: Goal set today Pt will Ambulate: 51 - 150 feet;with  supervision;with rolling walker PT Goal: Ambulate - Progress: Goal set today Pt will Go Up / Down Stairs: 3-5 stairs;with min assist;with rolling walker PT Goal: Up/Down Stairs - Progress: Goal set today  PT Evaluation Precautions/Restrictions  Precautions Precautions: Posterior Hip Precaution Comments: reviewed w/ pt and daughter Required Braces or Orthoses: No Restrictions Weight Bearing Restrictions: No Other Position/Activity Restrictions: wbat Prior Functioning  Home Living Lives With: Alone Type of Home: House Home Layout: One level Home Access: Stairs to enter Entrance Stairs-Rails: None Entrance Stairs-Number of Steps: 3 Bathroom Shower/Tub: Engineer, manufacturing systems: Standard Home Adaptive Equipment: None Prior Function Level of Independence: Independent with basic ADLs;Independent with transfers;Independent with homemaking with ambulation;Independent with gait Able to Take Stairs?: Yes Driving: Yes Vocation: Full time employment Cognition Cognition Arousal/Alertness: Awake/alert Overall Cognitive Status: Appears within functional limits for tasks assessed Orientation Level: Oriented X4 Sensation/Coordination   Extremity Assessment RUE Assessment RUE Assessment: Within Functional Limits LUE Assessment LUE Assessment: Within Functional Limits RLE Assessment RLE Assessment: Within Functional Limits LLE Assessment LLE Assessment: Exceptions to [facility] LLE Strength LLE Overall Strength Comments: pt ablr to slide LLE to edge, weight bear. Mobility (including Balance) Bed Mobility Bed Mobility: Yes Supine to Sit: 4: Min assist;HOB elevated (Comment degrees);With rails Supine to Sit Details (indicate cue type and reason): cues for hand placement. Sitting - Scoot to Edge of Bed: 4: Min assist Transfers Sit to Stand: 4: Min assist;From bed;With upper extremity assist Sit to Stand Details (indicate cue type and reason): cues for hand placement Stand to Sit: 4:  Min assist;With upper extremity assist;To chair/3-in-1;With armrests Stand to Sit Details: cues for hand placement    Exercise    End of Session PT - End of Session Equipment Utilized During Treatment: Gait belt Activity Tolerance: Patient limited by fatigue (pt also wobbly with standing)  Patient left: in chair;with family/visitor present Nurse Communication: Mobility status for transfers General Behavior During Session: [facility] for tasks performed Cognition: [facility] for tasks performed  Rada Hay 09/04/2011, 4:19 PM

## 2011-09-04 NOTE — Evaluation (Signed)
Occupational Therapy Evaluation Patient Details Name: Susan Petty MRN: [id number] DOB: 06/25/1946 Today's Date: 09/04/2011  Problem List:  Patient Active Problem List  Diagnoses  . S/P left THA, s/p hip fx    Past Medical History:  Past Medical History  Diagnosis Date  . Hypertension   . Diabetes mellitus    Past Surgical History: History reviewed. No pertinent past surgical history.  OT Assessment/Plan/Recommendation OT Assessment Clinical Impression Statement: Pt presents with a decline in BADL performance s/p fall THR. Skilled OT recommended to maximize I w/BADLs in prep for d/c home with HHOT and prn A from family. OT Recommendation/Assessment: Patient will need skilled OT in the acute care venue OT Problem List: Decreased activity tolerance;Decreased safety awareness;Decreased knowledge of use of DME or AE OT Therapy Diagnosis : Generalized weakness OT Plan OT Frequency: Min 2X/week OT Treatment/Interventions: Self-care/ADL training;Therapeutic activities;DME and/or AE instruction;Patient/family education OT Recommendation Follow Up Recommendations: Home health OT and 3:1 BSC OT Goals Acute Rehab OT Goals OT Goal Formulation: With patient/family Time For Goal Achievement: 2 weeks ADL Goals Pt Will Perform Grooming: with supervision;Standing at sink (X 3 tasks to improve standing activity tolerance.) ADL Goal: Grooming - Progress: Goal set today Pt Will Perform Lower Body Bathing: with supervision;Sit to stand from chair;Sit to stand from bed;with adaptive equipment ADL Goal: Lower Body Bathing - Progress: Goal set today Pt Will Perform Lower Body Dressing: with supervision;Sit to stand from chair;Sit to stand from bed;with adaptive equipment ADL Goal: Lower Body Dressing - Progress: Goal set today Pt Will Transfer to Toilet: with supervision;3-in-1;Ambulation;Stand pivot transfer;Maintaining hip precautions ADL Goal: Toilet Transfer - Progress: Goal set today Pt Will  Perform Toileting - Clothing Manipulation: with supervision;Standing ADL Goal: Toileting - Clothing Manipulation - Progress: Goal set today Pt Will Perform Toileting - Hygiene: with supervision;Sit to stand from 3-in-1/toilet ADL Goal: Toileting - Hygiene - Progress: Goal set today  OT Evaluation Precautions/Restrictions  Precautions Precautions: Posterior Hip Required Braces or Orthoses: No Restrictions Weight Bearing Restrictions: No Other Position/Activity Restrictions: wbat Prior Functioning Home Living Lives With: Alone. Pt will have 24/7 A at home upon d/c. Type of Home: House Home Layout: One level Home Access: Stairs to enter Entrance Stairs-Rails: None Entrance Stairs-Number of Steps: 3 Bathroom Shower/Tub: Electrical engineer Equipment: None Prior Function Level of Independence: Independent with basic ADLs;Independent with transfers;Independent with homemaking with ambulation;Independent with gait ADL ADL Grooming: Simulated;Set up Where Assessed - Grooming: Sitting, bed;Unsupported Upper Body Bathing: Simulated;Set up Where Assessed - Upper Body Bathing: Sitting, bed;Unsupported Lower Body Bathing: Simulated;Maximal assistance Where Assessed - Lower Body Bathing: Sit to stand from bed Upper Body Dressing: Simulated;Set up Where Assessed - Upper Body Dressing: Sitting, bed;Unsupported Lower Body Dressing: Simulated;Maximal assistance Where Assessed - Lower Body Dressing: Sit to stand from bed Toilet Transfer: Performed;Minimal assistance Toilet Transfer Details (indicate cue type and reason): cues for hand placement, technique when turning. Toilet Transfer Method: Surveyor, minerals: Set designer - Clothing Manipulation: Performed;Minimal assistance Where Assessed - Glass blower/designer Manipulation: Sit to stand from 3-in-1 or toilet Toileting - Hygiene: Performed;Minimal assistance Where  Assessed - Toileting Hygiene: Sit to stand from 3-in-1 or toilet Tub/Shower Transfer: Not assessed Tub/Shower Transfer Method: Not assessed Equipment Used: Rolling walker;Other (comment) (3:1) ADL Comments: Eval limited due to low BP and hgb. Pt had just received 1 unit of blood. Vision/Perception    Cognition Cognition Arousal/Alertness: Awake/alert Overall Cognitive Status: Appears within functional limits for tasks  assessed Orientation Level: Oriented X4 Sensation/Coordination   Extremity Assessment RUE Assessment RUE Assessment: Within Functional Limits LUE Assessment LUE Assessment: Within Functional Limits Mobility  Bed Mobility Bed Mobility: Yes Supine to Sit: 4: Min assist;HOB elevated (Comment degrees);With rails Supine to Sit Details (indicate cue type and reason): cues for hand placement. Sitting - Scoot to Edge of Bed: 4: Min assist Transfers Transfers: Yes Sit to Stand: 4: Min assist;From bed;With upper extremity assist Sit to Stand Details (indicate cue type and reason): cues for hand placement Stand to Sit: 4: Min assist;With upper extremity assist;To chair/3-in-1;With armrests Stand to Sit Details: cues for hand placement Exercises   End of Session OT - End of Session Equipment Utilized During Treatment: Gait belt Activity Tolerance: Patient limited by fatigue Patient left: in chair;with call bell in reach General Behavior During Session: [facility] for tasks performed Cognition: [facility] for tasks performed   Susan Petty A 09/04/2011, 2:57 PM

## 2011-09-04 NOTE — Progress Notes (Signed)
Comments:  09/04/2011 Susan Petty does not service area wher patient lives; [facility] does not services area, [facility] will call CM back regarding if they service area.

## 2011-09-04 NOTE — Progress Notes (Signed)
CARE MANAGEMENT NOTE 09/04/2011  Patient:  TANEAL, SONNTAG   Account Number:  [id number]  Date Initiated:  09/04/2011  Documentation initiated by:  Colleen Can  Subjective/Objective Assessment:   dx left hip fracture ; left total hip replacemnt 09/03/2011     Action/Plan:   Cm spoke with patient. Current plans are for patient to return to her home in [facility]) where her daughter will be caregiver. 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   (If response is "NO", the following Medicare IM given date fields will be blank) Date Medicare IM given:   Date Additional Medicare IM given:    Discharge Disposition:    Per UR Regulation:    Comments:  09/04/2011 Pt will need RW, 3N1; Pt wants HH agency that is in [employer].  Cm searching for agency that is in network and that goes to [place], [place].

## 2011-09-05 ENCOUNTER — Encounter (HOSPITAL_COMMUNITY): Payer: Self-pay | Admitting: Orthopedic Surgery

## 2011-09-05 DIAGNOSIS — E876 Hypokalemia: Secondary | ICD-10-CM | POA: Insufficient documentation

## 2011-09-05 DIAGNOSIS — D62 Acute posthemorrhagic anemia: Secondary | ICD-10-CM | POA: Insufficient documentation

## 2011-09-05 LAB — TYPE AND SCREEN

## 2011-09-05 LAB — BASIC METABOLIC PANEL
BUN: 24 mg/dL — ABNORMAL HIGH (ref 6–23)
Calcium: 8.1 mg/dL — ABNORMAL LOW (ref 8.4–10.5)
Chloride: 103 mEq/L (ref 96–112)
Creatinine, Ser: 0.77 mg/dL (ref 0.50–1.10)
GFR calc Af Amer: >90 mL/min (ref >90)

## 2011-09-05 LAB — CBC
HCT: 31.9 % — ABNORMAL LOW (ref 36.0–46.0)
MCH: 31 pg (ref 26.0–34.0)
MCV: 90.2 fL (ref 78.0–100.0)
RDW: 15.4 % (ref 11.5–15.5)
WBC: 9.1 10*3/uL (ref 4.0–10.5)

## 2011-09-05 LAB — GLUCOSE, CAPILLARY: Glucose-Capillary: 161 mg/dL — ABNORMAL HIGH (ref 70–99)

## 2011-09-05 MED ORDER — DSS 100 MG PO CAPS: 100.0000 mg | ORAL_CAPSULE | Freq: Two times a day (BID) | ORAL | Status: AC

## 2011-09-05 MED ORDER — ENOXAPARIN SODIUM 40 MG/0.4ML ~~LOC~~ SOLN: 40.0000 mg | SUBCUTANEOUS | Status: DC

## 2011-09-05 MED ORDER — FERROUS SULFATE 325 (65 FE) MG PO TABS: 325.0000 mg | ORAL_TABLET | Freq: Three times a day (TID) | ORAL | Status: DC

## 2011-09-05 MED ORDER — DIPHENHYDRAMINE HCL 25 MG PO CAPS: 25.0000 mg | ORAL_CAPSULE | Freq: Four times a day (QID) | ORAL | Status: DC | PRN

## 2011-09-05 MED ORDER — ASPIRIN EC 325 MG PO TBEC: 325.0000 mg | DELAYED_RELEASE_TABLET | Freq: Every day | ORAL | Status: AC

## 2011-09-05 MED ORDER — POTASSIUM CHLORIDE CRYS ER 20 MEQ PO TBCR: 40.0000 meq | EXTENDED_RELEASE_TABLET | Freq: Every day | ORAL | Status: DC

## 2011-09-05 MED ORDER — METHOCARBAMOL 500 MG PO TABS: 500.0000 mg | ORAL_TABLET | Freq: Four times a day (QID) | ORAL | Status: AC | PRN

## 2011-09-05 MED ORDER — HYDROCODONE-ACETAMINOPHEN 5-325 MG PO TABS: 1.0000 | ORAL_TABLET | ORAL | Status: AC

## 2011-09-05 MED ORDER — POTASSIUM CHLORIDE CRYS ER 20 MEQ PO TBCR
20.0000 meq | EXTENDED_RELEASE_TABLET | Freq: Once | ORAL | Status: AC
  Administered 2011-09-05: 20 meq via ORAL
  Filled 2011-09-05: qty 1

## 2011-09-05 MED ORDER — DEXTROSE 50 % IV SOLN
INTRAVENOUS | Status: AC
  Administered 2011-09-05: 50 mL
  Filled 2011-09-05: qty 50

## 2011-09-05 MED ORDER — INSULIN ASPART 100 UNIT/ML ~~LOC~~ SOLN
0.0000 [IU] | Freq: Three times a day (TID) | SUBCUTANEOUS | Status: DC
  Filled 2011-09-05: qty 3

## 2011-09-05 MED ORDER — POLYETHYLENE GLYCOL 3350 17 G PO PACK: 17.0000 g | PACK | Freq: Two times a day (BID) | ORAL | Status: AC

## 2011-09-05 MED FILL — Ketorolac Tromethamine Inj 30 MG/ML: INTRAMUSCULAR | Qty: 1 | Status: AC

## 2011-09-05 MED FILL — Bupivacaine Inj 0.25% w/ Epinephrine 1:200000 (PF): INTRAMUSCULAR | Qty: 30 | Status: AC

## 2011-09-05 MED FILL — Scopolamine TD Patch 72HR 1 MG/3DAYS: TRANSDERMAL | Qty: 1 | Status: AC

## 2011-09-05 NOTE — Clinical Documentation Improvement (Signed)
Anemia Blood Loss Clarification  THIS DOCUMENT IS NOT A PERMANENT PART OF THE MEDICAL RECORD  RESPOND TO THE THIS QUERY, FOLLOW THE INSTRUCTIONS BELOW:  1. If needed, update documentation for the patient's encounter via the notes activity.  2. Access this query again and click edit on the In [employer].  3. After updating, or not, click F2 to complete all highlighted (required) fields concerning your review. Select "additional documentation in the medical record" OR "no additional documentation provided".  4. Click Sign note button.  5. The deficiency will fall out of your In Basket *Please let us know if you are not able to complete this workflow by phone or e-mail (listed below).        09/05/11  Dear Susan Halls, PA Marton Redwood  In an effort to better capture your patient's severity of illness, reflect appropriate length of stay and utilization of resources, a review of the patient medical record has revealed the following indicators.    Based on your clinical judgment, please clarify and document in a progress note and/or discharge summary the clinical condition associated with the following supporting information:  In responding to this query please exercise your independent judgment.  The fact that a query is asked, does not imply that any particular answer is desired or expected.  Noted patient's H/H 2/25 12.6/36.3// 2/26 12.6/35.6 and on 2/27 7.7/22.5 with EBL 700 in surgery. Transfused 2 units of PRBC.  Please clarify appropriate secondary diagnosis if known.  Thank you   Possible Clinical Conditions?   " Acute Blood Loss Anemia   Risk Factors: s/p Lt tot hip replacement  Supporting Information:  Hgb  from 12.6 down to 7.7 and Hct 36.3 down to 22.5  Signs and Symptoms: none  Diagnostics: H&H on admit: 2/25  12.6/36.3  Post OP H&H: 2/27 7.7/22.5  H&H after TX: 2/28 11.0/31.9  Transfusion: 2/27 trfs 2 units PRBC  IV fluids / plasma expanders: 500 ml  of Hextend in OR/ 2400 ml LR in OR, then NS w 20 kcl  100 ml/hr on 2/26  H&H monitoring CBC done daily since 2/25  Medications (Fe, Procrit): Ferrous Sulfate 325 mg po daily x 3  Reviewed: additional documentation in the medical record  Thank You,  Leonette Most Addison  Clinical Documentation Specialist RN, BSN:  Pager [phone] HIM off [phone]  Health Information Management [place]

## 2011-09-05 NOTE — Progress Notes (Signed)
Subjective: 2 Days Post-Op Procedure(s) (LRB): TOTAL HIP ARTHROPLASTY (Left)   Patient reports pain as mild, c/o some soreness to the hip. She realizes that her blood sugar is not well managed and realizes that she needs to follow up with her PCP to obtain better management. She does state that she feels ready to be discharged home.  Objective:   VITALS:   Filed Vitals:   09/05/11 0605  BP: 119/68  Pulse: 81  Temp: 97.9 F (36.6 C)  Resp: 16    Neurovascular intact Dorsiflexion/Plantar flexion intact Incision: dressing C/D/I No cellulitis present Compartment soft  LABS  Basename 09/05/11 0433 09/04/11 0400 09/03/11 0150  HGB 11.0* 7.7* 12.6  HCT 31.9* 22.5* 35.6*  WBC 9.1 8.8 11.6*  PLT 106* 114* 137*     Basename 09/05/11 0433 09/04/11 0805 09/04/11 0400 09/03/11 0150  NA 138 -- 137 140  K 2.8* -- 4.1 4.4  BUN 24* -- 27* 11  CREATININE 0.77 -- 0.92 0.51  GLUCOSE 182* 548* 386* --     Assessment/Plan: 2 Days Post-Op Procedure(s) (LRB): TOTAL HIP ARTHROPLASTY (Left)  Hypokalemia, she will be given 40 meq today and then daily until she sees her PCP for follow up Glycemic control, will place back on medium scale of insulin control. She knows how to monitor and adjust, also to f/u with PCP for better control. Up with therapy Discharge home with home health Follow up in 2 weeks at [facility].  Follow-up Information    Follow up with OLIN,Walta [place] D in 2 weeks.   Contact information:   [facility] [address], [address] [place] [address] [phone]          Anastasio Auerbach. Walton Digilio   PAC  09/05/2011, 8:50 AM

## 2011-09-05 NOTE — Progress Notes (Signed)
md notified of glucose values overnight, informed to pass information on to attending, also made aware of potassium of 2.8, orders received and carried out will cont. To monitor

## 2011-09-05 NOTE — Progress Notes (Signed)
Pt's blood glucose 69 at 2112, pt ate snack, stated that she ate very little of her dinner and not much lunch, glucose at 2151 40 1 amp d50 given, pt alert, responsive, denies s/s of hypoglycemia, at 2234 glucose 74, at 0106 glucose 53 1 amp d50 given at 0300 glucose 167. Pt alert responsive with no complaints, md notified

## 2011-09-05 NOTE — Progress Notes (Signed)
Order for kdur received from on call md DR. Hewitt, Md made aware of glucose values overnight. Will cont. To monitor

## 2011-09-05 NOTE — Progress Notes (Signed)
Physical Therapy Treatment Patient Details Name: Susan Petty MRN: [id number] DOB: 1946-06-06 Today's Date: 09/05/2011  PT Assessment/Plan  PT - Assessment/Plan Comments on Treatment Session: pt is less dopey than yesterday and report by OT during theirsession this  am. pt expresses concern that family does not have all DC plans lined up. Family will be taking turns to care for pt. encouraged pt. use BSC at night for safety. reviewed post.hip  precautions with pt.. pt could benefit from stay until 3/1 but family is making arrangements, pt is more alert. RN states meds have been withheld to increase pts arousal.. pt has DME will practice steps w/ family when she returns. PT Plan: Discharge plan remains appropriate PT Frequency: 7X/week Equipment Recommended: Rolling walker with 5" wheels;3 in 1 bedside comode PT Goals  Acute Rehab PT Goals Pt will go Sit to Supine/Side: with supervision;with HOB 0 degrees PT Goal: Sit to Supine/Side - Progress: Progressing toward goal Pt will go Sit to Stand: with supervision;with upper extremity assist PT Goal: Sit to Stand - Progress: Progressing toward goal Pt will go Stand to Sit: with supervision PT Goal: Stand to Sit - Progress: Progressing toward goal Pt will Ambulate: 51 - 150 feet;with supervision;with rolling walker PT Goal: Ambulate - Progress: Progressing toward goal Additional Goals Additional Goal #1: demo/state posterior hip precasutions PT Goal: Additional Goal #1 - Progress: Progressing toward goal  PT Treatment Precautions/Restrictions  Precautions Precautions: Posterior Hip Precaution Booklet Issued: Yes (comment) Precaution Comments: reviewed w/ pt. pt had legs crossed when PT entere room. Required Braces or Orthoses: No Restrictions Weight Bearing Restrictions: No Other Position/Activity Restrictions: wbat Mobility (including Balance) Bed Mobility Bed Mobility: Yes Sit to Supine: 4: Min assist;HOB flat Sit to Supine -  Details (indicate cue type and reason): assist for LLE onto bed. Transfers Transfers: Yes Sit to Stand: 4: Min assist;From chair/3-in-1;With armrests;With upper extremity assist Sit to Stand Details (indicate cue type and reason): vc to push from surface and hav LLE out in front. Stand to Sit: 5: Supervision;With armrests;To bed;To chair/3-in-1;With upper extremity assist Stand to Sit Details: vc to place LLe forward and to reach back, Ambulation/Gait Ambulation/Gait: Yes Ambulation/Gait Assistance: 4: Min assist (min guard) Ambulation/Gait Assistance Details (indicate cue type and reason):    vc for sequence and turns away from L side. Ambulation Distance (Feet): 50 Feet Assistive device: Rolling walker Gait Pattern: Step-to pattern    Exercise    End of Session PT - End of Session Activity Tolerance: Patient tolerated treatment well Patient left: in bed;with call bell in reach Nurse Communication: Mobility status for transfers (concern for DC as pt. ) General Behavior During Session: [facility] for tasks performed Cognition: [facility] for tasks performed  Rada Hay 09/05/2011, 2:51 PM

## 2011-09-05 NOTE — Progress Notes (Signed)
CARE MANAGEMENT NOTE 09/05/2011  Patient:  Susan Petty, Susan Petty   Account Number:  [id number]  Date Initiated:  09/04/2011  Documentation initiated by:  Colleen Can  Subjective/Objective Assessment:   dx left hip fracture ; left total hip replacemnt 09/03/2011     Action/Plan:   Cm spoke with patient. Current plans are for patient to return to her home in [facility]) where her daughter will be caregiver. 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     HH arranged  HH-2 PT  HH-3 OT      [facility] agency  [facility]   Status of service:  Completed, signed off Medicare Important Message given?  NA - LOS <3 / Initial given by admissions (If response is "NO", the following Medicare IM given date fields will be blank) Date Medicare IM given:   Date Additional Medicare IM given:    Discharge Disposition:  HOME W HOME HEALTH SERVICES  Per UR Regulation:    Comments:  09/05/2011 Raynelle Bring bsn ccm [facility] does service area in [place]. Cm spoke with patient and daughter. Plans are that patient will not return to her home in [facility] but will go stay with daughter in [place], [place] where she will be cared for around the clock. [facility] Care contacted and advised that they donot cover that are in [facility]. [facility] does not service [place]. [facility] does not service are. [facility] care does not have availability of physical therapy for 2 weeks. [facility] can service area with start date of tomorrow 09/06/2011. [facility] has delivered RW to  patient' room. The family plans to purchase 3N1 at retail store. Pt to be talk self administration of lovenox by staff nurse. Daughter will check with her mother's pharmacy regarding availability of injectable prescription. Pt does have prescription insurance plan.

## 2011-09-05 NOTE — Progress Notes (Signed)
Occupational Therapy Treatment Patient Details Name: Susan Petty MRN: [id number] DOB: 06-23-1946 Today's Date: 09/05/2011  OT Assessment/Plan OT Assessment/Plan Comments on Treatment Session: Pt needed constant cuing for safety with THPs and sequence with RW use. She seemed groggy and slow to process information. Nsg made aware. Do not feel patient can safely discharge home today.  OT Plan: Discharge plan remains appropriate OT Frequency: Min 2X/week Follow Up Recommendations: Home health OT Equipment Recommended: Rolling walker with 5" wheels;3 in 1 bedside comode OT Goals ADL Goals ADL Goal: Lower Body Dressing - Progress: Progressing toward goals (though slow to process information. nsg aware. ) ADL Goal: Toilet Transfer - Progress: Not progressing ADL Goal: Toileting - Clothing Manipulation - Progress: Not progressing ADL Goal: Toileting - Hygiene - Progress: Not progressing *patient at same level as eval for toilet transfer and hygiene/clothing. Not progressing this visit with those tasks as she is slow to process information/follow precautions.   OT Treatment Precautions/Restrictions  Precautions Precautions: Posterior Hip Precaution Booklet Issued: Yes (comment) Precaution Comments: reviewed with patient and daughter Restrictions Weight Bearing Restrictions: No   ADL ADL Lower Body Dressing: Moderate assistance Lower Body Dressing Details (indicate cue type and reason): max verbal cues. Pt sleepy/groggy and having difficulty following commands. Slow to process information. Daughter present and states this is how patient was yesterday as well with cognition. Nsg made aware. Pt was trying to  lean forward to place sock aid on floor after instructed to not bend forward. Mod Reinforcement to follow THPs.  Where Assessed - Lower Body Dressing: Unsupported;Sitting, bed Toilet Transfer: Performed;Minimal assistance Toilet Transfer Details (indicate cue type and reason): max  verbal cues for safety and THPs  Toilet Transfer Method: Stand pivot Toilet Transfer Equipment: Bedside commode Toileting - Clothing Manipulation: Simulated;Minimal assistance Where Assessed - Glass blower/designer Manipulation: Standing Toileting - Hygiene: Minimal assistance;Performed Where Assessed - Toileting Hygiene: Standing Equipment Used: Long-handled shoe horn;Long-handled sponge;Reacher;Rolling walker;Sock aid ADL Comments: Assisted patient to [facility] but since patient with difficulty following commands, requested assist from rehab tech for safety and manage IV. Pt and daughter educated on tub bench versus seat options but patient states she will sponge initially. reviewed all precautions with patient. After patient transferred to the recliner, she looks down at her legs and says "my legs are so white." She then realizes that she is wearing white stockings. Nsg made aware of all of above.  Mobility    Exercises    End of Session OT - End of Session Equipment Utilized During Treatment: Other (comment) (RW) Activity Tolerance: Other (comment) (decreased safety and processing. ? meds) Patient left: in chair;with call bell in reach;with family/visitor present Nurse Communication: Mobility status for transfers General Behavior During Session: [facility] for tasks performed Cognition: Impaired (slow to process info, constant reinforcement for THPs,)  Lennox Laity  [phone] 09/05/2011, 12:23 PM

## 2011-09-05 NOTE — Progress Notes (Signed)
Physical Therapy Treatment Patient Details Name: Susan Petty MRN: [id number] DOB: 15-Aug-1945 Today's Date: 09/05/2011  PT Assessment/Plan  PT - Assessment/Plan Comments on Treatment Session: Pt progressing better, more alert.  Daughter present during session.  Handout given on THP and educated.  Handout given on HEP and educated.  Instructed on use of ICE to L hip.  Instructed on proper car transfer.  Instructed on safety with RW and est length of use.  Instructed on proper positioning L LE using pillows.  Family plans to take turns and provide 27/7 care/assist/supervision.  Pt going to a home that has a ramp, so did not practice steps.  Advised on proper level of assist when pt does asend ramp. PT Plan: Discharge plan remains appropriate PT Frequency: 7X/week Follow Up Recommendations: Home health PT Equipment Recommended: Other (comment) (RW and 3:1 delivered and in room) PT Goals  Acute Rehab PT Goals PT Goal Formulation: With patient Pt will go Supine/Side to Sit: with supervision PT Goal: Supine/Side to Sit - Progress: Met Pt will go Sit to Supine/Side: with supervision PT Goal: Sit to Supine/Side - Progress: Met Pt will go Sit to Stand: with supervision PT Goal: Sit to Stand - Progress: Partly met Pt will go Stand to Sit: with supervision PT Goal: Stand to Sit - Progress: Partly met Pt will Ambulate: 51 - 150 feet;with supervision;with rolling walker PT Goal: Ambulate - Progress: Partly met Pt will Go Up / Down Stairs: 3-5 stairs;with min assist;with rolling walker PT Goal: Up/Down Stairs - Progress: Other (comment) (Pt going to a house with a ramp) Additional Goals Additional Goal #1: demon/state posterior THP PT Goal: Additional Goal #1 - Progress: Progressing toward goal  PT Treatment Precautions/Restrictions  Precautions Precautions: Posterior Hip Precaution Booklet Issued: Yes (comment) Precaution Comments: Pt/dght given handout and instructed on THP Required Braces  or Orthoses: No Restrictions Weight Bearing Restrictions: No Other Position/Activity Restrictions: Pt/dghtr instructed on WBAT Mobility (including Balance) Bed Mobility Bed Mobility: Yes Supine to Sit: Other (comment) (MinGuard Assist) Supine to Sit Details (indicate cue type and reason): increased time and one VC to avoid L hip IR Sitting - Scoot to Edge of Bed: Other (comment) (MinGuard Assist) Sit to Supine: 4: Min assist;HOB flat Sit to Supine - Details (indicate cue type and reason): assist for LLE onto bed. Transfers Transfers: Yes Sit to Stand: Other (comment);From bed;From toilet (MinGuard Assist) Sit to Stand Details (indicate cue type and reason): 75% VC's to avoid excessive hip flex > 90' and hand placemnt for proper ptech Stand to Sit: Other (comment);To toilet;To chair/3-in-1 (MinGuard Assistr) Stand to Sit Details: 75% VC's to advance L LE and avoid hip flex > 90' Ambulation/Gait Ambulation/Gait: Yes Ambulation/Gait Assistance: Other (comment) (MinGuard Assist) Ambulation/Gait Assistance Details (indicate cue type and reason): still slightly unsteady, drunken gait.  Dght advised to assist pt for safety everytime pt gets up until pt demon better stability.  Amb pt in hallway, 25% VC's on safety with turns and backward gait. Ambulation Distance (Feet): 115 Feet Assistive device: Rolling walker Gait Pattern: Step-to pattern;Decreased stance time - left Gait velocity: Pt c/o "tightness" L hip, no pain.. ICE pack applied and rest. Stairs: No (Dghtr states pt is going to a home that has a ramp) Naval architect Mobility: No    Exercise  Pt given handout on THR HEP Total Joint Exercises Ankle Circles/Pumps: AROM;Both;10 reps;Seated Quad Sets: AROM;Both;10 reps;Seated Gluteal Sets: AROM;Both;10 reps;Seated Short Arc Quad: AROM;Left;10 reps;Supine Heel Slides: AAROM;Left;10 reps;Supine Hip ABduction/ADduction:  AROM;Left;10 reps;Supine Long Arc Quad:  AROM;Left;10 reps;Seated End of Session PT - End of Session Equipment Utilized During Treatment: Gait belt Activity Tolerance: Patient tolerated treatment well Patient left: in chair;with call bell in reach;with family/visitor present Nurse Communication: Other (comment) (Pt ready for D/C to home with 24/7 supervision) General Behavior During Session: [facility] for tasks performed Cognition: [facility] for tasks performed  Felecia Shelling  PTA [facility]  Acute  Rehab Pager     [phone]

## 2011-09-06 NOTE — Discharge Summary (Signed)
Physician Discharge Summary  Patient ID: Susan Petty MRN: [id number] DOB/AGE: Mar 01, 1946 66 y.o.  Admit date: 09/02/2011 Discharge date: 09/05/2011  Procedures:  Procedure(s) (LRB): TOTAL HIP ARTHROPLASTY (Left)  Attending Physician: Dr. Durene Romans   Admission Diagnoses: Left hip fracture  Discharge Diagnoses:  Principal Problem:  *S/P left THA, s/p hip fx HTN DM  HPI: Pt is a 66 y.o. female complaining of left hip pain. Susan Petty was playing basketball with her grandson this evening when she tripped and fell. Immediate onset left hip pain. Brought to [facility] ER where initial work up including Xrays revealed left femoral neck fracture. Orthopaedics was consulted for admission and definitive management. No other complaints. No black out, no chest pain.   PCP: Susan Oats, MD, MD   Discharged Condition: good  Hospital Course:  Patient underwent the above stated procedure on 09/03/2011. Patient tolerated the procedure well and brought to the recovery room in good condition and subsequently to the floor.  POD #1 BP: 110/64 ; Pulse: 84 ; Temp: 98.4 F (36.9 C) ; Resp: 16 Pt's foley was removed, as well as the hemovac drain removed. IV was changed to a saline lock. Patient reports pain as mild. Pt blood sugar elevate and H&H low. Pt was transfused with 2 units of blood.  Otherwise no events.  Neurovascular intact, dorsiflexion/plantar flexion intact, incision: dressing C/Petty/I, no cellulitis present and compartment soft.  LABS  Basename  09/04/11 0400   HGB  7.7  HCT  22.5    POD #2  BP: 119/68 ; Pulse: 81 ; Temp: 97.9 F (36.6 C) ; Resp: 16 Patient reports pain as mild, c/o some soreness to the hip. She realizes that her blood sugar is not well managed and realizes that she needs to follow up with her PCP to obtain better management. She does state that she feels ready to be discharged home.   LABS  Basename  09/05/11 0433  HGB  11.0  HCT  31.9    Discharge  Exam: General appearance: alert, cooperative and no distress Extremities: Homans sign is negative, no sign of DVT, no edema, redness or tenderness in the calves or thighs and no ulcers, gangrene or trophic changes  Disposition: Home-Health Care Svc with follow up in 2 weeks  Follow-up Information    Follow up with Susan Petty in 2 weeks.   Contact information:   [facility] [address], [address] [place] [address] [phone]          Discharge Orders    Future Orders Please Complete By Expires   Diet - low sodium heart healthy      Call MD / Call 911      Comments:   If you experience chest pain or shortness of breath, CALL 911 and be transported to the hospital emergency room.  If you develope a fever above 101 F, pus (white drainage) or increased drainage or redness at the wound, or calf pain, call your surgeon's office.   Discharge instructions      Comments:   Maintain surgical dressing for 8 days, then replace with gauze and tape. Keep the area dry and clean until follow up. Follow up in 2 weeks at [facility]. Call with any questions or concerns.     Constipation Prevention      Comments:   Drink plenty of fluids.  Prune juice may be helpful.  You may use a stool softener, such as Colace (over the counter) 100 mg  twice a day.  Use MiraLax (over the counter) for constipation as needed.   Increase activity slowly as tolerated      Weight Bearing as taught in Physical Therapy      Comments:   Use a walker or crutches as instructed.   Driving restrictions      Comments:   No driving for 4 weeks   Change dressing      Comments:   Maintain surgical dressing for 8 days, then replace with 4x4 guaze and tape. Keep the area dry and clean.   TED hose      Comments:   Use stockings (TED hose) for 2 weeks on both leg(s).  You may remove them at night for sleeping.      Discharge Medication List as of 09/05/2011 12:11 PM     START taking these medications   Details  aspirin EC 325 MG tablet Take 1 tablet (325 mg total) by mouth daily. X 4 weeks, Starting 09/05/2011, Until Sun 09/15/11, No Print    diphenhydrAMINE (BENADRYL) 25 mg capsule Take 1 capsule (25 mg total) by mouth every 6 (six) hours as needed for itching, allergies or sleep., Starting 09/05/2011, Until Sun 09/15/11, No Print    docusate sodium 100 MG CAPS Take 100 mg by mouth 2 (two) times daily., Starting 09/05/2011, Until Sun 09/15/11, No Print    enoxaparin (LOVENOX) 40 MG/0.4ML SOLN Inject 0.4 mLs (40 mg total) into the skin daily., Starting 09/05/2011, Until Thu 09/19/11, Print    ferrous sulfate 325 (65 FE) MG tablet Take 1 tablet (325 mg total) by mouth 3 (three) times daily after meals., Starting 09/05/2011, Until Fri 09/04/12, No Print    HYDROcodone-acetaminophen (NORCO) 5-325 MG per tablet Take 1-2 tablets by mouth every 4 (four) hours., Starting 09/05/2011, Until Sun 09/15/11, Print    methocarbamol (ROBAXIN) 500 MG tablet Take 1 tablet (500 mg total) by mouth every 6 (six) hours as needed., Starting 09/05/2011, Until Sun 09/15/11, Print    polyethylene glycol (MIRALAX / GLYCOLAX) packet Take 17 g by mouth 2 (two) times daily., Starting 09/05/2011, Until Sun 09/08/11, No Print    potassium chloride SA (K-DUR,KLOR-CON) 20 MEQ tablet Take 2 tablets (40 mEq total) by mouth daily., Starting 09/05/2011, Until Fri 09/04/12, Print      CONTINUE these medications which have NOT CHANGED   Details  benazepril (LOTENSIN) 20 MG tablet Take 20 mg by mouth daily., Until Discontinued, Historical Med    hydrochlorothiazide (HYDRODIURIL) 25 MG tablet Take 25 mg by mouth daily., Until Discontinued, Historical Med    insulin NPH (HUMULIN N,NOVOLIN N) 100 UNIT/ML injection Inject 20 Units into the skin 3 (three) times daily with meals., Until Discontinued, Historical Med    LORazepam (ATIVAN) 0.5 MG tablet Take 0.5 mg by mouth 2 (two) times daily., Until Discontinued,  Historical Med    metoprolol (LOPRESSOR) 50 MG tablet Take 50 mg by mouth every morning., Until Discontinued, Historical Med    omeprazole (PRILOSEC) 40 MG capsule Take 40 mg by mouth daily., Until Discontinued, Historical Med        Signed: Anastasio Auerbach. Errica Dutil   PAC  09/06/2011, 5:09 PM

## 2011-09-10 NOTE — H&P (Signed)
Patient ID: Susan Petty, female DOB: June 13, 1946, 66 y.o. MRN: [id number]   Susan Petty is an 66 y.o. female.   Chief Complaint: Left hip pain   HPI: Pt is a 66 y.o. female complaining of left hip pain. [name] was playing basketball with her grandson this evening when she tripped and fell. Immediate onset left hip pain. Brought to [facility] ER where initial work up including Xrays revealed left femoral neck fracture. Orthopaedics was consulted for admission and definitive management. No other complaints. No black out, no chest pain.   PCP: Sheila Oats, MD, MD   D/C Plans: To be determined following appropriate treatment plan   PMH:  Past Medical History   Diagnosis  Date   .  Hypertension    .  Diabetes mellitus     PSH:  History reviewed. No pertinent past surgical history.   Social History: reports that she has never smoked. She has never used smokeless tobacco. She reports that she drinks alcohol. She reports that she does not use illicit drugs.   Allergies:  Allergies   Allergen  Reactions   .  Sulfa Drugs Cross Reactors  Rash    Medications:  Medications Prior to Admission   Medication  Dose  Route  Frequency  Provider  Last Rate  Last Dose   .  morphine 4 MG/ML injection 4 mg  4 mg  Intravenous  Once  [name], MD   4 mg at 09/02/11 2314   .  ondansetron (ZOFRAN) injection 4 mg  4 mg  Intravenous  Once  [name], PA   4 mg at 09/02/11 2259    No current outpatient prescriptions on file as of 09/02/2011.    Results for orders placed during the hospital encounter of 09/02/11 (from the past 48 hour(s))   CBC Status: Abnormal    Collection Time    09/02/11 10:05 PM   Component  Value  Range  Comment    WBC  11.9 (*)  4.0 - 10.5 (K/uL)     RBC  3.92  3.87 - 5.11 (MIL/uL)     Hemoglobin  12.6  12.0 - 15.0 (g/dL)     HCT  13.2  44.0 - 46.0 (%)     MCV  92.6  78.0 - 100.0 (fL)     MCH  32.1  26.0 - 34.0 (pg)     MCHC  34.7  30.0 - 36.0 (g/dL)       RDW  10.2  72.5 - 15.5 (%)     Platelets  147 (*)  150 - 400 (K/uL)    BASIC METABOLIC PANEL Status: Abnormal    Collection Time    09/02/11 10:05 PM   Component  Value  Range  Comment    Sodium  142  135 - 145 (mEq/L)     Potassium  3.2 (*)  3.5 - 5.1 (mEq/L)     Chloride  104  96 - 112 (mEq/L)     CO2  29  19 - 32 (mEq/L)     Glucose, Bld  161 (*)  70 - 99 (mg/dL)     BUN  10  6 - 23 (mg/dL)     Creatinine, Ser  3.66  0.50 - 1.10 (mg/dL)     Calcium  9.5  8.4 - 10.5 (mg/dL)     GFR calc non Af Amer  >90  >90 (mL/min)     GFR calc Af Amer  >90  >  90 (mL/min)     Dg Chest 2 View  09/02/2011 *RADIOLOGY REPORT* Clinical Data: Larey Seat, femur fracture CHEST - 2 VIEW Comparison: None. Findings: Mild cardiomegaly. Lungs clear. No effusion. Regional bones unremarkable. IMPRESSION: 1. Mild cardiomegaly. Original Report Authenticated By: Osa Craver, M.D.   Dg Hip Complete Left  09/02/2011 *RADIOLOGY REPORT* Clinical Data: Pain post fall. LEFT HIP - COMPLETE 2+ VIEW Comparison: None. Findings: There is a transverse mid-cervical fracture, impacted with a mild varus deformity and some foreshortening. No significant osseous degenerative change. IMPRESSION: 1. Left femoral neck fracture as above. Per CMS PQRS reporting requirements (PQRS Measure 24): Given the patient's age of greater than 50 and the fracture site (hip, distal radius, or spine), the patient should be tested for osteoporosis using DXA, and the appropriate treatment considered based on the DXA results. Original Report Authenticated By: [name], M.D.   ROS:  General ROS: negative for - chills, fatigue, fever, malaise, night sweats, sleep disturbance or weight loss  Left hip pain, otherwise benign.   Physican Exam:  Blood pressure 174/57, pulse 88, temperature 98.1 F (36.7 C), temperature source Oral, resp. rate 18, height 5\' 5"  (1.651 m), weight 53.524 kg (118 lb), SpO2 90.00%.  HEENT: PERRLA Neck: Supple, no  JVD, no lymphadenopathy Cardio: Benign Neuro: Benign Ortho: DNVI, good sensation to light touch, +2 DP pulse, TTP of the left hip, pain with any ROM of the left hip / femur.    Assessment/Plan   Assessment: Left displaced femoral neck fracture   Plan:  Patient will undergo a left total hip replacement for management of her hip fracture based on her age. Risks benefits and expectation were discussed with the patient. Patient understand risks, benefits and expectation and wishes to proceed. She may snack for the time being but then will be NPO for surgery tomorrow.   I will be transferring her to [facility] to perform the surgery after/during my elective schedule.   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 Luismiguel Lamere   PAC  09/10/2011, 2:11 PM

## 2012-05-13 LAB — TSH: TSH: 3.02 u[IU]/mL (ref <=5.90)

## 2012-05-13 LAB — LIPID PANEL: LDL Cholesterol: 111 mg/dL

## 2012-06-20 ENCOUNTER — Encounter: Payer: Self-pay | Admitting: Physician Assistant

## 2012-10-15 ENCOUNTER — Other Ambulatory Visit: Payer: Self-pay | Admitting: Physician Assistant

## 2012-10-16 ENCOUNTER — Other Ambulatory Visit: Payer: Self-pay | Admitting: *Deleted

## 2012-10-16 MED ORDER — INSULIN NPH (HUMAN) (ISOPHANE) 100 UNIT/ML ~~LOC~~ SUSP: 20.0000 [IU] | Freq: Three times a day (TID) | SUBCUTANEOUS | Status: DC

## 2012-11-04 ENCOUNTER — Telehealth: Payer: Self-pay | Admitting: Nurse Practitioner

## 2012-11-05 NOTE — Telephone Encounter (Signed)
lmtcb- at 1043 on 4/30- still no response from pt. Pt can call back if appt still needed.

## 2012-11-06 ENCOUNTER — Other Ambulatory Visit: Payer: Self-pay | Admitting: *Deleted

## 2012-11-06 MED ORDER — HYDROCHLOROTHIAZIDE 25 MG PO TABS: 25.0000 mg | ORAL_TABLET | Freq: Every day | ORAL | Status: DC

## 2012-11-06 MED ORDER — LORAZEPAM 0.5 MG PO TABS: ORAL_TABLET | ORAL | Status: DC

## 2012-11-06 MED ORDER — OMEPRAZOLE 40 MG PO CPDR: 40.0000 mg | DELAYED_RELEASE_CAPSULE | Freq: Every day | ORAL | Status: DC

## 2012-11-06 NOTE — Telephone Encounter (Signed)
ATIVAN LAST RF 10/15/12. CALL IN [facility]

## 2012-11-10 NOTE — Telephone Encounter (Signed)
Called to [facility] in [place]

## 2012-11-10 NOTE — Telephone Encounter (Signed)
PLEASE RECALL ATIVAN. STILL RECEIVING REQUESTS FROM [facility]. THANKS.

## 2012-12-13 ENCOUNTER — Other Ambulatory Visit: Payer: Self-pay | Admitting: Family Medicine

## 2012-12-14 ENCOUNTER — Encounter: Payer: Self-pay | Admitting: Nurse Practitioner

## 2012-12-14 ENCOUNTER — Telehealth: Payer: Self-pay | Admitting: Family Medicine

## 2012-12-14 ENCOUNTER — Ambulatory Visit (INDEPENDENT_AMBULATORY_CARE_PROVIDER_SITE_OTHER): Payer: Medicare Other | Admitting: Nurse Practitioner

## 2012-12-14 VITALS — BP 143/71 | HR 81 | Temp 98.1°F | Ht 64.0 in | Wt 118.0 lb

## 2012-12-14 DIAGNOSIS — F411 Generalized anxiety disorder: Secondary | ICD-10-CM

## 2012-12-14 DIAGNOSIS — K219 Gastro-esophageal reflux disease without esophagitis: Secondary | ICD-10-CM | POA: Insufficient documentation

## 2012-12-14 DIAGNOSIS — E1159 Type 2 diabetes mellitus with other circulatory complications: Secondary | ICD-10-CM | POA: Insufficient documentation

## 2012-12-14 DIAGNOSIS — E785 Hyperlipidemia, unspecified: Secondary | ICD-10-CM

## 2012-12-14 DIAGNOSIS — E1169 Type 2 diabetes mellitus with other specified complication: Secondary | ICD-10-CM | POA: Insufficient documentation

## 2012-12-14 DIAGNOSIS — I10 Essential (primary) hypertension: Secondary | ICD-10-CM

## 2012-12-14 DIAGNOSIS — E119 Type 2 diabetes mellitus without complications: Secondary | ICD-10-CM | POA: Insufficient documentation

## 2012-12-14 MED ORDER — OMEPRAZOLE 40 MG PO CPDR: 40.0000 mg | DELAYED_RELEASE_CAPSULE | Freq: Every day | ORAL | Status: DC

## 2012-12-14 MED ORDER — ALPRAZOLAM 0.5 MG PO TABS: 0.5000 mg | ORAL_TABLET | Freq: Every evening | ORAL | Status: DC | PRN

## 2012-12-14 MED ORDER — METOPROLOL TARTRATE 50 MG PO TABS: 50.0000 mg | ORAL_TABLET | Freq: Two times a day (BID) | ORAL | Status: DC

## 2012-12-14 MED ORDER — INSULIN NPH (HUMAN) (ISOPHANE) 100 UNIT/ML ~~LOC~~ SUSP: 20.0000 [IU] | Freq: Three times a day (TID) | SUBCUTANEOUS | Status: DC

## 2012-12-14 MED ORDER — HYDROCHLOROTHIAZIDE 25 MG PO TABS: 25.0000 mg | ORAL_TABLET | Freq: Every day | ORAL | Status: DC

## 2012-12-14 MED ORDER — BENAZEPRIL HCL 40 MG PO TABS: 40.0000 mg | ORAL_TABLET | Freq: Every day | ORAL | Status: DC

## 2012-12-14 NOTE — Patient Instructions (Signed)

## 2012-12-14 NOTE — Progress Notes (Signed)
Subjective:    Patient ID: Susan Petty, female    DOB: [birth year], 67 y.o.   MRN: [id number]  Hypertension This is a chronic problem. The current episode started more than 1 year ago. The problem has been waxing and waning since onset. The problem is uncontrolled. Pertinent negatives include no chest pain, headaches, neck pain, palpitations, peripheral edema, shortness of breath or sweats. There are no associated agents to hypertension. Risk factors for coronary artery disease include diabetes mellitus, dyslipidemia and post-menopausal state. Past treatments include ACE inhibitors, diuretics and beta blockers. The current treatment provides moderate improvement.  Hyperlipidemia This is a chronic problem. The current episode started more than 1 year ago. The problem is uncontrolled. Recent lipid tests were reviewed and are high (LDL). Factors aggravating her hyperlipidemia include thiazides. Pertinent negatives include no chest pain or shortness of breath. She is currently on no antihyperlipidemic treatment. There are no compliance problems.  Risk factors for coronary artery disease include diabetes mellitus and hypertension.  Diabetes She presents for her follow-up diabetic visit. She has type 2 diabetes mellitus. No MedicAlert identification noted. The initial diagnosis of diabetes was made 20 years ago. Her disease course has been stable. There are no hypoglycemic associated symptoms. Pertinent negatives for hypoglycemia include no headaches or sweats. Pertinent negatives for diabetes include no chest pain. There are no hypoglycemic complications. Symptoms are stable. There are no diabetic complications. Risk factors for coronary artery disease include dyslipidemia, hypertension and post-menopausal. Current diabetic treatment includes insulin injections (hunulin N-20u an d10u Qhs and hunulinr- sliding scale just at bedtime.). She is compliant with treatment all of the time. She is currently taking  insulin pre-breakfast and at bedtime. Rotation sites for injection include the abdominal wall. Her weight is stable. She is following a generally healthy diet. When asked about meal planning, she reported none. She has not had a previous visit with a dietician. She participates in exercise weekly. Her breakfast blood glucose is taken between 8-9 am. Her breakfast blood glucose range is generally 140-180 mg/dl. Her highest blood glucose is 140-180 mg/dl. Her overall blood glucose range is 140-180 mg/dl. An ACE inhibitor/angiotensin II receptor blocker is being taken. She does not see a podiatrist.Eye exam is current (4 months ago).  GAD Lorazepam not helping with her anxiety anymore- Only takes a couple times a week and not helping. GERD Prilosec OTC- works well to keep symptoms under control  Review of Systems  HENT: Negative for neck pain.   Respiratory: Negative for shortness of breath.   Cardiovascular: Negative for chest pain and palpitations.  Neurological: Negative for headaches.  All other systems reviewed and are negative.       Objective:   Physical Exam  Constitutional: She is oriented to person, place, and time. She appears well-developed and well-nourished.  HENT:  Nose: Nose normal.  Mouth/Throat: Oropharynx is clear and moist.  Eyes: EOM are normal.  Neck: Trachea normal, normal range of motion and full passive range of motion without pain. Neck supple. No JVD present. Carotid bruit is not present. No thyromegaly present.  Cardiovascular: Normal rate, regular rhythm, normal heart sounds and intact distal pulses.  Exam reveals no gallop and no friction rub.   No murmur heard. Pulmonary/Chest: Effort normal and breath sounds normal.  Abdominal: Soft. Bowel sounds are normal. She exhibits no distension and no mass. There is no tenderness.  Musculoskeletal: Normal range of motion.  Lymphadenopathy:    She has no cervical adenopathy.  Neurological: She is  alert and oriented to  person, place, and time. She has normal reflexes.  Skin: Skin is warm and dry.  Psychiatric: She has a normal mood and affect. Her behavior is normal. Judgment and thought content normal.    BP 143/71  Pulse 81  Temp(Src) 98.1 F (36.7 C) (Oral)  Ht 5\' 4"  (1.626 m)  Wt 118 lb (53.524 kg)  BMI 20.24 kg/m2  Results for orders placed in visit on 12/14/12  POCT GLYCOSYLATED HEMOGLOBIN (HGB A1C)      Result Value Range   Hemoglobin A1C 8.9%          Assessment & Plan:   1. HTN (hypertension)   2. Diabetes   3. GERD (gastroesophageal reflux disease)   4. Hyperlipidemia   5. GAD (generalized anxiety disorder)    Orders Placed This Encounter  Procedures  . COMPLETE METABOLIC PANEL WITH GFR  . NMR Lipoprofile with Lipids  . POCT glycosylated hemoglobin (Hb A1C)   Meds ordered this encounter  Medications  . DISCONTD: NOVOLIN R RELION 100 UNIT/ML injection    Sig:   . DISCONTD: benazepril (LOTENSIN) 40 MG tablet    Sig: Take 40 mg by mouth daily.  . insulin regular (NOVOLIN R,HUMULIN R) 100 units/mL injection    Sig: Inject into the skin 3 (three) times daily before meals.  Marland Kitchen glucose blood test strip    Sig: 1 each by Other route as needed for other. Use as instructed  . DISCONTD: insulin NPH (HUMULIN N,NOVOLIN N) 100 UNIT/ML injection    Sig: Inject 20 Units into the skin 3 (three) times daily with meals. 25 units every am and 10units qhs  . benazepril (LOTENSIN) 40 MG tablet    Sig: Take 1 tablet (40 mg total) by mouth daily.    Dispense:  30 tablet    Refill:  5    Order Specific Question:  Supervising Provider    Answer:  Ernestina Penna [[id number]]  . hydrochlorothiazide (HYDRODIURIL) 25 MG tablet    Sig: Take 1 tablet (25 mg total) by mouth daily.    Dispense:  30 tablet    Refill:  5    Order Specific Question:  Supervising Provider    Answer:  Ernestina Penna [[id number]]  . insulin NPH (HUMULIN N,NOVOLIN N) 100 UNIT/ML injection    Sig: Inject 20 Units into the skin 3  (three) times daily with meals. 25 units every am and 10units qhs    Dispense:  2 vial    Refill:  11    Order Specific Question:  Supervising Provider    Answer:  Ernestina Penna [[id number]]  . omeprazole (PRILOSEC) 40 MG capsule    Sig: Take 1 capsule (40 mg total) by mouth daily.    Dispense:  30 capsule    Refill:  2    Order Specific Question:  Supervising Provider    Answer:  Ernestina Penna [[id number]]  . ALPRAZolam (XANAX) 0.5 MG tablet    Sig: Take 1 tablet (0.5 mg total) by mouth at bedtime as needed for sleep.    Dispense:  60 tablet    Refill:  1    Order Specific Question:  Supervising Provider    Answer:  Ernestina Penna [[id number]]  . metoprolol (LOPRESSOR) 50 MG tablet    Sig: Take 1 tablet (50 mg total) by mouth 2 (two) times daily.    Dispense:  60 tablet    Refill:  5    Order Specific  Question:  Supervising Provider    Answer:  Ernestina Penna [[id number]]   Continue all meds as rx Diet and exercise encouraged Labs pending Follow-up in 3 months Appointment with clinical pharmacist to discuss diabetes  Mary-Margaret Daphine Deutscher, FNP

## 2012-12-15 LAB — COMPLETE METABOLIC PANEL WITH GFR
AST: 26 U/L (ref 0–37)
Albumin: 4.1 g/dL (ref 3.5–5.2)
Alkaline Phosphatase: 94 U/L (ref 39–117)
BUN: 11 mg/dL (ref 6–23)
Potassium: 5.1 mEq/L (ref 3.5–5.3)
Sodium: 146 mEq/L — ABNORMAL HIGH (ref 135–145)
Total Bilirubin: 0.5 mg/dL (ref 0.3–1.2)
Total Protein: 6.7 g/dL (ref 6.0–8.3)

## 2012-12-15 LAB — NMR LIPOPROFILE WITH LIPIDS
Cholesterol, Total: 180 mg/dL (ref <200)
HDL-C: 72 mg/dL (ref >=40)
LDL Particle Number: 1013 nmol/L — ABNORMAL HIGH (ref <1000)
Large VLDL-P: 1.8 nmol/L (ref <=2.7)
Triglycerides: 85 mg/dL (ref <150)
VLDL Size: 43.1 nm (ref <=46.6)

## 2012-12-16 ENCOUNTER — Telehealth: Payer: Self-pay | Admitting: Nurse Practitioner

## 2012-12-16 NOTE — Telephone Encounter (Signed)
Lab results given to patient.

## 2012-12-16 NOTE — Telephone Encounter (Signed)
What does patient need 

## 2012-12-17 ENCOUNTER — Telehealth: Payer: Self-pay | Admitting: *Deleted

## 2012-12-17 DIAGNOSIS — E119 Type 2 diabetes mellitus without complications: Secondary | ICD-10-CM

## 2012-12-17 MED ORDER — INSULIN NPH (HUMAN) (ISOPHANE) 100 UNIT/ML ~~LOC~~ SUSP: SUBCUTANEOUS | Status: DC

## 2012-12-17 NOTE — Telephone Encounter (Signed)
[facility] pharmacy called stating that they need clarification on insulin directions. Please advise and resend to pharmacy

## 2012-12-17 NOTE — Telephone Encounter (Signed)
Set rx to pharmacy 

## 2012-12-23 NOTE — Telephone Encounter (Signed)
Lm on 6/10- today 6/18- still no response from pt- she can call back if appt is still needed

## 2013-01-05 ENCOUNTER — Ambulatory Visit (INDEPENDENT_AMBULATORY_CARE_PROVIDER_SITE_OTHER): Payer: Medicare Other | Admitting: Physician Assistant

## 2013-01-05 ENCOUNTER — Encounter: Payer: Self-pay | Admitting: Physician Assistant

## 2013-01-05 VITALS — BP 158/67 | HR 62 | Temp 99.2°F | Wt 121.0 lb

## 2013-01-05 DIAGNOSIS — I10 Essential (primary) hypertension: Secondary | ICD-10-CM

## 2013-01-05 DIAGNOSIS — H6120 Impacted cerumen, unspecified ear: Secondary | ICD-10-CM

## 2013-01-05 DIAGNOSIS — H6122 Impacted cerumen, left ear: Secondary | ICD-10-CM

## 2013-01-05 MED ORDER — HYDROCHLOROTHIAZIDE 25 MG PO TABS: 25.0000 mg | ORAL_TABLET | Freq: Every day | ORAL | Status: DC

## 2013-01-05 NOTE — Progress Notes (Signed)
Subjective:     Patient ID: Susan Petty, female   DOB: 07/10/1945, 67 y.o.   MRN: [id number]  HPI Pt with a feeling something in the L ear Denies any pain or drainage from the ear Hx of impacted cerumen to the same ear   Review of Systems  All other systems reviewed and are negative.       Objective:   Physical Exam  Nursing note and vitals reviewed. R ear canal and TM nl L ext nl, canal impacted with cerumen L ear irrigated with removal of plug Following canal/TM nl     Assessment:     Cerumen impaction L ear    Plan:     No Q-tip use Wax softener monthly F/U prn

## 2013-01-05 NOTE — Progress Notes (Signed)
Left ear irrigated until clear.  Curette used to remove debris.  Patient tolerated well.

## 2013-01-05 NOTE — Patient Instructions (Signed)
Cerumen Impaction A cerumen impaction is when the wax in your ear forms a plug. This plug usually causes reduced hearing. Sometimes it also causes an earache or dizziness. Removing a cerumen impaction can be difficult and painful. The wax sticks to the ear canal. The canal is sensitive and bleeds easily. If you try to remove a heavy wax buildup with a cotton tipped swab, you may push it in further. Irrigation with water, suction, and small ear curettes may be used to clear out the wax. If the impaction is fixed to the skin in the ear canal, ear drops may be needed for a few days to loosen the wax. People who build up a lot of wax frequently can use ear wax removal products available in your local drugstore. SEEK MEDICAL CARE IF:  You develop an earache, increased hearing loss, or marked dizziness. Document Released: 08/01/2004 Document Revised: 09/16/2011 Document Reviewed: 09/21/2009 ExitCare Patient Information 2014 [employer].  

## 2013-02-16 ENCOUNTER — Ambulatory Visit (INDEPENDENT_AMBULATORY_CARE_PROVIDER_SITE_OTHER): Payer: Medicare Other | Admitting: General Practice

## 2013-02-16 ENCOUNTER — Encounter: Payer: Self-pay | Admitting: General Practice

## 2013-02-16 VITALS — BP 140/64 | HR 67 | Temp 97.3°F | Ht 64.0 in | Wt 120.5 lb

## 2013-02-16 DIAGNOSIS — J322 Chronic ethmoidal sinusitis: Secondary | ICD-10-CM

## 2013-02-16 MED ORDER — AMOXICILLIN 500 MG PO CAPS: 500.0000 mg | ORAL_CAPSULE | Freq: Two times a day (BID) | ORAL | Status: DC

## 2013-02-16 NOTE — Progress Notes (Signed)
  Subjective:    Patient ID: Susan Petty, female    DOB: October 30, 1945, 67 y.o.   MRN: [id number]  Sinus Problem This is a new problem. The current episode started 1 to 4 weeks ago. The problem has been gradually worsening since onset. There has been no fever. Associated symptoms include congestion, headaches, sinus pressure and sneezing. Pertinent negatives include no chills, coughing, ear pain, hoarse voice, shortness of breath or sore throat. Past treatments include nothing.  Reports zpac hasn't been effective in the past.     Review of Systems  Constitutional: Negative for fever and chills.  HENT: Positive for congestion, sneezing and sinus pressure. Negative for ear pain, sore throat and hoarse voice.   Respiratory: Negative for cough, chest tightness and shortness of breath.   Neurological: Positive for headaches. Negative for dizziness and weakness.       Objective:   Physical Exam  Constitutional: She appears well-developed and well-nourished.  HENT:  Head: Normocephalic and atraumatic.  Nose: Right sinus exhibits maxillary sinus tenderness and frontal sinus tenderness. Left sinus exhibits maxillary sinus tenderness and frontal sinus tenderness.  Mouth/Throat: Posterior oropharyngeal erythema present.  Cardiovascular: Normal rate, regular rhythm and normal heart sounds.   No murmur heard. Pulmonary/Chest: Effort normal and breath sounds normal. No respiratory distress. She exhibits no tenderness.  Neurological: She is alert.  Skin: Skin is warm and dry. No rash noted.  Psychiatric: She has a normal mood and affect.          Assessment & Plan:  1. Ethmoid sinusitis - amoxicillin (AMOXIL) 500 MG capsule; Take 1 capsule (500 mg total) by mouth 2 (two) times daily.  Dispense: 20 capsule; Refill: 0 -increase fluids -RTO if symptoms worsen or unresolved -Patient verbalized understanding -Coralie Keens, FNP-C

## 2013-02-16 NOTE — Patient Instructions (Addendum)

## 2013-04-20 ENCOUNTER — Telehealth: Payer: Self-pay | Admitting: General Practice

## 2013-04-20 ENCOUNTER — Ambulatory Visit (INDEPENDENT_AMBULATORY_CARE_PROVIDER_SITE_OTHER): Payer: Medicare Other | Admitting: General Practice

## 2013-04-20 ENCOUNTER — Encounter: Payer: Self-pay | Admitting: General Practice

## 2013-04-20 VITALS — BP 159/63 | HR 69 | Temp 98.0°F | Ht 64.0 in | Wt 123.0 lb

## 2013-04-20 DIAGNOSIS — I10 Essential (primary) hypertension: Secondary | ICD-10-CM

## 2013-04-20 DIAGNOSIS — K219 Gastro-esophageal reflux disease without esophagitis: Secondary | ICD-10-CM

## 2013-04-20 DIAGNOSIS — E119 Type 2 diabetes mellitus without complications: Secondary | ICD-10-CM

## 2013-04-20 DIAGNOSIS — F411 Generalized anxiety disorder: Secondary | ICD-10-CM

## 2013-04-20 MED ORDER — ALPRAZOLAM 0.5 MG PO TABS: 0.5000 mg | ORAL_TABLET | Freq: Every evening | ORAL | Status: DC | PRN

## 2013-04-20 NOTE — Patient Instructions (Signed)

## 2013-04-20 NOTE — Progress Notes (Signed)
  Subjective:    Patient ID: Susan Petty, female    DOB: July 19, 1945, 67 y.o.   MRN: [id number]  HPI Patient presents today for 3 month follow up. She has a history of hypertension, diabetes, anxiety, and gerd. She reports taking medications as prescribed. Reports checking blood pressure at home 2-3 times weekly and ranges 140-160/60-71. Reports checking blood sugars four times daily and ranges low 100's to 200. She reports anxiety is well controlled with xanax. Reports walking 1 mile daily and try to follow a diabetic diet.     Review of Systems  Constitutional: Negative for fever and chills.  Respiratory: Negative for chest tightness and shortness of breath.   Cardiovascular: Negative for chest pain.  Gastrointestinal: Negative for nausea, vomiting, abdominal pain, diarrhea, constipation and blood in stool.  Genitourinary: Negative for dysuria, hematuria and difficulty urinating.  Musculoskeletal: Negative for back pain and myalgias.  Neurological: Negative for dizziness, weakness and headaches.       Objective:   Physical Exam  Constitutional: She is oriented to person, place, and time. She appears well-developed and well-nourished.  HENT:  Head: Normocephalic and atraumatic.  Right Ear: External ear normal.  Left Ear: External ear normal.  Nose: Nose normal.  Mouth/Throat: Oropharynx is clear and moist.  Eyes: EOM are normal. Pupils are equal, round, and reactive to light.  Neck: Normal range of motion. Neck supple. No thyromegaly present.  Cardiovascular: Normal rate, regular rhythm and normal heart sounds.   Pulmonary/Chest: Effort normal and breath sounds normal. No respiratory distress. She exhibits no tenderness.  Abdominal: Soft. Bowel sounds are normal. She exhibits no distension. There is no tenderness.  Musculoskeletal: She exhibits no edema and no tenderness.  Lymphadenopathy:    She has no cervical adenopathy.  Neurological: She is alert and oriented to person,  place, and time.  Skin: Skin is warm and dry.  Psychiatric: She has a normal mood and affect.          Assessment & Plan:  1. GAD (generalized anxiety disorder)  - ALPRAZolam (XANAX) 0.5 MG tablet; Take 1 tablet (0.5 mg total) by mouth at bedtime as needed for sleep.  Dispense: 30 tablet; Refill: 0  2. HTN (hypertension)  - benazepril (LOTENSIN) 40 MG tablet; Take 1 tablet (40 mg total) by mouth daily.  Dispense: 30 tablet; Refill: 3 - metoprolol (LOPRESSOR) 50 MG tablet; Take 1 tablet (50 mg total) by mouth 2 (two) times daily.  Dispense: 60 tablet; Refill: 3 - hydrochlorothiazide (HYDRODIURIL) 25 MG tablet; Take 1 tablet (25 mg total) by mouth daily.  Dispense: 30 tablet; Refill: 0  3. GERD (gastroesophageal reflux disease)  - omeprazole (PRILOSEC) 40 MG capsule; Take 1 capsule (40 mg total) by mouth daily.  Dispense: 30 capsule; Refill: 3  4. Diabetes  - glucose blood test strip; Use as instructed  Dispense: 100 each; Refill: 6 - insulin NPH (HUMULIN N,NOVOLIN N) 100 UNIT/ML injection; 25 u AM and 10 U pm  Dispense: 2 vial; Refill: 5 - insulin regular (NOVOLIN R,HUMULIN R) 100 units/mL injection; Inject 0.1 mLs (10 Units total) into the skin 3 (three) times daily before meals.  Dispense: 10 mL; Refill: 5 -Continue all current medications Labs pending F/u in 3 months Discussed exercise and diet  Patient verbalized understanding Coralie Keens, FNP-C

## 2013-04-20 NOTE — Telephone Encounter (Signed)
Takes 10 units 3times a day but really only takes when sugar is high said she would call back and let you know this

## 2013-04-21 MED ORDER — BENAZEPRIL HCL 40 MG PO TABS: 40.0000 mg | ORAL_TABLET | Freq: Every day | ORAL | Status: DC

## 2013-04-21 MED ORDER — HYDROCHLOROTHIAZIDE 25 MG PO TABS: 25.0000 mg | ORAL_TABLET | Freq: Every day | ORAL | Status: DC

## 2013-04-21 MED ORDER — OMEPRAZOLE 40 MG PO CPDR: 40.0000 mg | DELAYED_RELEASE_CAPSULE | Freq: Every day | ORAL | Status: DC

## 2013-04-21 MED ORDER — INSULIN REGULAR HUMAN 100 UNIT/ML IJ SOLN: 10.0000 [IU] | Freq: Three times a day (TID) | INTRAMUSCULAR | Status: DC

## 2013-04-21 MED ORDER — METOPROLOL TARTRATE 50 MG PO TABS: 50.0000 mg | ORAL_TABLET | Freq: Two times a day (BID) | ORAL | Status: DC

## 2013-04-21 MED ORDER — INSULIN NPH (HUMAN) (ISOPHANE) 100 UNIT/ML ~~LOC~~ SUSP: SUBCUTANEOUS | Status: DC

## 2013-04-21 MED ORDER — GLUCOSE BLOOD VI STRP: ORAL_STRIP | Status: DC

## 2013-04-23 NOTE — Telephone Encounter (Signed)
Med ordered

## 2013-05-05 ENCOUNTER — Other Ambulatory Visit (INDEPENDENT_AMBULATORY_CARE_PROVIDER_SITE_OTHER): Payer: Medicare Other

## 2013-05-05 DIAGNOSIS — E785 Hyperlipidemia, unspecified: Secondary | ICD-10-CM

## 2013-05-05 DIAGNOSIS — E119 Type 2 diabetes mellitus without complications: Secondary | ICD-10-CM

## 2013-05-05 DIAGNOSIS — E1059 Type 1 diabetes mellitus with other circulatory complications: Secondary | ICD-10-CM

## 2013-05-05 DIAGNOSIS — I10 Essential (primary) hypertension: Secondary | ICD-10-CM

## 2013-05-05 LAB — POCT GLYCOSYLATED HEMOGLOBIN (HGB A1C): Hemoglobin A1C: 8

## 2013-05-05 NOTE — Progress Notes (Signed)
Pt came in for labs only 

## 2013-05-07 LAB — CMP14+EGFR
ALT: 17 IU/L (ref 0–32)
Albumin: 3.9 g/dL (ref 3.6–4.8)
BUN: 15 mg/dL (ref 8–27)
CO2: 29 mmol/L (ref 18–29)
Calcium: 9.4 mg/dL (ref 8.6–10.2)
Chloride: 100 mmol/L (ref 97–108)
GFR calc Af Amer: 94 mL/min/{1.73_m2} (ref >59)
Glucose: 276 mg/dL — ABNORMAL HIGH (ref 65–99)
Potassium: 3.8 mmol/L (ref 3.5–5.2)
Total Bilirubin: 0.4 mg/dL (ref 0.0–1.2)
Total Protein: 5.9 g/dL — ABNORMAL LOW (ref 6.0–8.5)

## 2013-05-07 LAB — NMR, LIPOPROFILE
Cholesterol: 182 mg/dL (ref <200)
HDL Cholesterol by NMR: 67 mg/dL (ref >=40)
LDL Particle Number: 1075 nmol/L — ABNORMAL HIGH (ref <1000)
LDLC SERPL CALC-MCNC: 96 mg/dL (ref <100)
Triglycerides by NMR: 94 mg/dL (ref <150)

## 2013-05-11 ENCOUNTER — Telehealth: Payer: Self-pay | Admitting: General Practice

## 2013-05-31 ENCOUNTER — Other Ambulatory Visit: Payer: Self-pay | Admitting: Nurse Practitioner

## 2013-06-01 ENCOUNTER — Other Ambulatory Visit: Payer: Self-pay | Admitting: General Practice

## 2013-06-01 NOTE — Telephone Encounter (Signed)
Please call into pharmacy. thx 

## 2013-06-01 NOTE — Telephone Encounter (Signed)
Last seen and filled 10/14, call into [facility]

## 2013-06-02 NOTE — Telephone Encounter (Signed)
Done

## 2013-06-15 ENCOUNTER — Encounter: Payer: Self-pay | Admitting: General Practice

## 2013-06-15 ENCOUNTER — Ambulatory Visit (INDEPENDENT_AMBULATORY_CARE_PROVIDER_SITE_OTHER): Payer: Medicare Other | Admitting: General Practice

## 2013-06-15 VITALS — BP 156/64 | HR 61 | Temp 98.1°F | Ht 64.0 in | Wt 122.0 lb

## 2013-06-15 DIAGNOSIS — J0110 Acute frontal sinusitis, unspecified: Secondary | ICD-10-CM

## 2013-06-15 MED ORDER — AZITHROMYCIN 250 MG PO TABS: ORAL_TABLET | ORAL | Status: DC

## 2013-06-15 NOTE — Progress Notes (Signed)
   Subjective:    Patient ID: Susan Petty, female    DOB: 1946/01/14, 67 y.o.   MRN: [id number]  Sinusitis This is a new problem. The current episode started in the past 7 days. The problem has been gradually worsening since onset. There has been no fever. Her pain is at a severity of 0/10. Pertinent negatives include no chills, congestion, shortness of breath or sneezing. Past treatments include nothing.      Review of Systems  Constitutional: Negative for chills.  HENT: Negative for congestion and sneezing.   Respiratory: Negative for chest tightness and shortness of breath.   Cardiovascular: Negative for chest pain and palpitations.  Genitourinary: Negative for difficulty urinating.       Objective:   Physical Exam  Constitutional: She is oriented to person, place, and time. She appears well-developed and well-nourished.  HENT:  Head: Normocephalic and atraumatic.  Right Ear: External ear normal.  Left Ear: External ear normal.  Nose: Right sinus exhibits maxillary sinus tenderness and frontal sinus tenderness. Left sinus exhibits maxillary sinus tenderness and frontal sinus tenderness.  Mouth/Throat: Posterior oropharyngeal erythema present.  Cardiovascular: Normal rate, regular rhythm and normal heart sounds.   Pulmonary/Chest: Effort normal and breath sounds normal. No respiratory distress. She exhibits no tenderness.  Neurological: She is alert and oriented to person, place, and time.  Skin: Skin is warm and dry.  Psychiatric: She has a normal mood and affect.          Assessment & Plan:  1. Sinusitis, acute frontal - azithromycin (ZITHROMAX) 250 MG tablet; Take as directed  Dispense: 6 tablet; Refill: 0 -adequate fluids -RTO if symptoms worsen or unresolved -Patient verbalized understanding Coralie Keens, FNP-C

## 2013-06-15 NOTE — Patient Instructions (Signed)

## 2013-06-18 ENCOUNTER — Telehealth: Payer: Self-pay | Admitting: General Practice

## 2013-06-18 ENCOUNTER — Telehealth: Payer: Self-pay | Admitting: *Deleted

## 2013-06-18 ENCOUNTER — Other Ambulatory Visit: Payer: Self-pay | Admitting: General Practice

## 2013-06-18 DIAGNOSIS — J329 Chronic sinusitis, unspecified: Secondary | ICD-10-CM

## 2013-06-18 MED ORDER — AMOXICILLIN-POT CLAVULANATE 875-125 MG PO TABS: 1.0000 | ORAL_TABLET | Freq: Two times a day (BID) | ORAL | Status: DC

## 2013-06-18 NOTE — Telephone Encounter (Signed)
Script sent to pharmacy.

## 2013-06-18 NOTE — Telephone Encounter (Signed)
Message copied by Tamera Punt on Fri Jun 18, 2013 11:46 AM ------      Message from: Philomena Doheny E      Created: Mon May 10, 2013  7:57 AM       Please inform that most labs are wnl. HgbA1C is elevated at 8.0%, which is lower than 4 months ago. Please schedule an appointment with Tammy to discuss diabetic management. ------

## 2013-06-18 NOTE — Telephone Encounter (Signed)
Patient aware of labs and will make an appt with Tammy

## 2013-06-18 NOTE — Telephone Encounter (Signed)
Patient aware that rx was sent to pharmacy.

## 2013-08-25 ENCOUNTER — Telehealth: Payer: Self-pay | Admitting: General Practice

## 2013-08-25 NOTE — Telephone Encounter (Signed)
appt made

## 2013-08-31 ENCOUNTER — Ambulatory Visit: Payer: Medicare Other | Admitting: General Practice

## 2013-08-31 ENCOUNTER — Telehealth: Payer: Self-pay | Admitting: General Practice

## 2013-08-31 ENCOUNTER — Other Ambulatory Visit: Payer: Self-pay | Admitting: General Practice

## 2013-08-31 DIAGNOSIS — I10 Essential (primary) hypertension: Secondary | ICD-10-CM

## 2013-08-31 DIAGNOSIS — K219 Gastro-esophageal reflux disease without esophagitis: Secondary | ICD-10-CM

## 2013-08-31 DIAGNOSIS — E119 Type 2 diabetes mellitus without complications: Secondary | ICD-10-CM

## 2013-08-31 MED ORDER — GLUCOSE BLOOD VI STRP: ORAL_STRIP | Status: DC

## 2013-08-31 MED ORDER — BENAZEPRIL HCL 40 MG PO TABS: 40.0000 mg | ORAL_TABLET | Freq: Every day | ORAL | Status: DC

## 2013-08-31 MED ORDER — OMEPRAZOLE 40 MG PO CPDR: 40.0000 mg | DELAYED_RELEASE_CAPSULE | Freq: Every day | ORAL | Status: DC

## 2013-08-31 MED ORDER — METOPROLOL TARTRATE 50 MG PO TABS: 50.0000 mg | ORAL_TABLET | Freq: Two times a day (BID) | ORAL | Status: DC

## 2013-08-31 MED ORDER — HYDROCHLOROTHIAZIDE 25 MG PO TABS: 25.0000 mg | ORAL_TABLET | Freq: Every day | ORAL | Status: DC

## 2013-08-31 NOTE — Telephone Encounter (Signed)
Please inform scripts sent to pharmacy

## 2013-09-13 ENCOUNTER — Ambulatory Visit: Payer: Medicare Other | Admitting: General Practice

## 2013-10-05 ENCOUNTER — Encounter: Payer: Self-pay | Admitting: General Practice

## 2013-10-05 ENCOUNTER — Ambulatory Visit (INDEPENDENT_AMBULATORY_CARE_PROVIDER_SITE_OTHER): Payer: Medicare Other | Admitting: General Practice

## 2013-10-05 ENCOUNTER — Encounter (INDEPENDENT_AMBULATORY_CARE_PROVIDER_SITE_OTHER): Payer: Self-pay

## 2013-10-05 VITALS — BP 168/62 | HR 63 | Temp 97.2°F | Ht 66.0 in | Wt 125.6 lb

## 2013-10-05 DIAGNOSIS — I10 Essential (primary) hypertension: Secondary | ICD-10-CM

## 2013-10-05 DIAGNOSIS — E109 Type 1 diabetes mellitus without complications: Secondary | ICD-10-CM

## 2013-10-05 DIAGNOSIS — K219 Gastro-esophageal reflux disease without esophagitis: Secondary | ICD-10-CM

## 2013-10-05 DIAGNOSIS — E119 Type 2 diabetes mellitus without complications: Secondary | ICD-10-CM

## 2013-10-05 DIAGNOSIS — F411 Generalized anxiety disorder: Secondary | ICD-10-CM

## 2013-10-05 LAB — POCT GLYCOSYLATED HEMOGLOBIN (HGB A1C): HEMOGLOBIN A1C: 8.4

## 2013-10-05 MED ORDER — METOPROLOL TARTRATE 50 MG PO TABS: 50.0000 mg | ORAL_TABLET | Freq: Two times a day (BID) | ORAL | Status: DC

## 2013-10-05 MED ORDER — INSULIN REGULAR HUMAN 100 UNIT/ML IJ SOLN: 10.0000 [IU] | Freq: Three times a day (TID) | INTRAMUSCULAR | Status: DC

## 2013-10-05 MED ORDER — GLUCOSE BLOOD VI STRP: ORAL_STRIP | Status: DC

## 2013-10-05 MED ORDER — OMEPRAZOLE 40 MG PO CPDR: 40.0000 mg | DELAYED_RELEASE_CAPSULE | Freq: Every day | ORAL | Status: DC

## 2013-10-05 MED ORDER — INSULIN NPH (HUMAN) (ISOPHANE) 100 UNIT/ML ~~LOC~~ SUSP: SUBCUTANEOUS | Status: DC

## 2013-10-05 MED ORDER — HYDROCHLOROTHIAZIDE 25 MG PO TABS: 25.0000 mg | ORAL_TABLET | Freq: Every day | ORAL | Status: DC

## 2013-10-05 MED ORDER — ALPRAZOLAM 0.5 MG PO TABS: ORAL_TABLET | ORAL | Status: DC

## 2013-10-05 MED ORDER — BENAZEPRIL HCL 40 MG PO TABS: 40.0000 mg | ORAL_TABLET | Freq: Every day | ORAL | Status: DC

## 2013-10-05 NOTE — Patient Instructions (Signed)
Exercise to Stay Healthy Exercise helps you become and stay healthy. EXERCISE IDEAS AND TIPS Choose exercises that:  You enjoy.  Fit into your day. You do not need to exercise really hard to be healthy. You can do exercises at a slow or medium level and stay healthy. You can:  Stretch before and after working out.  Try yoga, Pilates, or tai chi.  Lift weights.  Walk fast, swim, jog, run, climb stairs, bicycle, dance, or rollerskate.  Take aerobic classes. Exercises that burn about 150 calories:  Running 1  miles in 15 minutes.  Playing volleyball for 45 to 60 minutes.  Washing and waxing a car for 45 to 60 minutes.  Playing touch football for 45 minutes.  Walking 1  miles in 35 minutes.  Pushing a stroller 1  miles in 30 minutes.  Playing basketball for 30 minutes.  Raking leaves for 30 minutes.  Bicycling 5 miles in 30 minutes.  Walking 2 miles in 30 minutes.  Dancing for 30 minutes.  Shoveling snow for 15 minutes.  Swimming laps for 20 minutes.  Walking up stairs for 15 minutes.  Bicycling 4 miles in 15 minutes.  Gardening for 30 to 45 minutes.  Jumping rope for 15 minutes.  Washing windows or floors for 45 to 60 minutes. Document Released: 07/27/2010 Document Revised: 09/16/2011 Document Reviewed: 07/27/2010 [employer] Patient Information 2014 [employer].  

## 2013-10-05 NOTE — Progress Notes (Signed)
   Subjective:    Patient ID: Susan Petty, female    DOB: 1945/12/28, 68 y.o.   MRN: [id number]  HPI Patient presents today for 3 month follow up. History of hypertension, diabetes type 1, anxiety, and gerd. She reports taking medications as prescribed. Reports checking blood pressure at home 3-4  times weekly and ranges 140's/60's. Blood sugars checked 4 times daily and ranges low 100's to 200. Anxiety is well controlled with xanax. Following diabetic diet and denies walking her 1 mild daily since colder weather. Plans resume to resume walking since season changing.      Review of Systems  Constitutional: Negative for fever and chills.  Respiratory: Negative for chest tightness and shortness of breath.   Cardiovascular: Negative for chest pain.  Gastrointestinal: Negative for nausea, vomiting, abdominal pain, diarrhea, constipation and blood in stool.  Genitourinary: Negative for dysuria, hematuria and difficulty urinating.  Musculoskeletal: Negative for back pain and myalgias.  Neurological: Negative for dizziness, weakness and headaches.       Objective:   Physical Exam  Constitutional: She is oriented to person, place, and time. She appears well-developed and well-nourished.  HENT:  Head: Normocephalic and atraumatic.  Right Ear: External ear normal.  Left Ear: External ear normal.  Nose: Nose normal.  Mouth/Throat: Oropharynx is clear and moist.  Eyes: EOM are normal. Pupils are equal, round, and reactive to light.  Neck: Normal range of motion. Neck supple. No thyromegaly present.  Cardiovascular: Normal rate, regular rhythm and normal heart sounds.   Pulmonary/Chest: Effort normal and breath sounds normal. No respiratory distress. She exhibits no tenderness.  Abdominal: Soft. Bowel sounds are normal. She exhibits no distension. There is no tenderness.  Musculoskeletal: She exhibits no edema and no tenderness.  Lymphadenopathy:    She has no cervical adenopathy.    Neurological: She is alert and oriented to person, place, and time.  Skin: Skin is warm and dry.  Psychiatric: She has a normal mood and affect.          Assessment & Plan:  1. HTN (hypertension)  - benazepril (LOTENSIN) 40 MG tablet; Take 1 tablet (40 mg total) by mouth daily.  Dispense: 30 tablet; Refill: 4 - hydrochlorothiazide (HYDRODIURIL) 25 MG tablet; Take 1 tablet (25 mg total) by mouth daily.  Dispense: 30 tablet; Refill: 4 - metoprolol (LOPRESSOR) 50 MG tablet; Take 1 tablet (50 mg total) by mouth 2 (two) times daily.  Dispense: 60 tablet; Refill: 4 - CMP14+EGFR - Lipid panel  2. GERD (gastroesophageal reflux disease)  - omeprazole (PRILOSEC) 40 MG capsule; Take 1 capsule (40 mg total) by mouth daily.  Dispense: 30 capsule; Refill: 4  3. DM (diabetes mellitus), type 1  - glucose blood test strip; Use as instructed  Dispense: 100 each; Refill: 12 - POCT glycosylated hemoglobin (Hb A1C)  4. GAD (generalized anxiety disorder)  - ALPRAZolam (XANAX) 0.5 MG tablet; TAKE ONE TABLET BY MOUTH AT BEDTIME AS NEEDED FOR SLEEP  Dispense: 30 tablet; Refill: 2 - insulin regular (NOVOLIN R,HUMULIN R) 100 units/mL injection; Inject 0.1 mLs (10 Units total) into the skin 3 (three) times daily before meals.  Dispense: 10 mL; Refill: 5 - insulin NPH Human (HUMULIN N,NOVOLIN N) 100 UNIT/ML injection; 25 u AM and 10 U pm  Dispense: 2 vial; Refill: 5 Continue all current medications Labs pending F/u in 3 months Discussed benefits of regular exercise and healthy eating Patient verbalized understanding Erby Pian, FNP-C

## 2013-10-06 LAB — CMP14+EGFR
ALT: 18 IU/L (ref 0–32)
AST: 16 IU/L (ref 0–40)
Albumin/Globulin Ratio: 2 (ref 1.1–2.5)
Albumin: 3.9 g/dL (ref 3.6–4.8)
Alkaline Phosphatase: 95 IU/L (ref 39–117)
BUN/Creatinine Ratio: 19 (ref 11–26)
BUN: 13 mg/dL (ref 8–27)
CALCIUM: 9.4 mg/dL (ref 8.7–10.3)
CO2: 29 mmol/L (ref 18–29)
CREATININE: 0.7 mg/dL (ref 0.57–1.00)
Chloride: 101 mmol/L (ref 97–108)
GFR calc Af Amer: 102 mL/min/{1.73_m2} (ref >59)
GFR calc non Af Amer: 89 mL/min/{1.73_m2} (ref >59)
Globulin, Total: 2 g/dL (ref 1.5–4.5)
Glucose: 245 mg/dL — ABNORMAL HIGH (ref 65–99)
Potassium: 4.6 mmol/L (ref 3.5–5.2)
Sodium: 145 mmol/L — ABNORMAL HIGH (ref 134–144)
Total Bilirubin: 0.4 mg/dL (ref 0.0–1.2)
Total Protein: 5.9 g/dL — ABNORMAL LOW (ref 6.0–8.5)

## 2013-10-06 LAB — LIPID PANEL
Chol/HDL Ratio: 2.5 ratio units (ref 0.0–4.4)
Cholesterol, Total: 173 mg/dL (ref 100–199)
HDL: 69 mg/dL (ref >39)
LDL CALC: 88 mg/dL (ref 0–99)
Triglycerides: 80 mg/dL (ref 0–149)
VLDL CHOLESTEROL CAL: 16 mg/dL (ref 5–40)

## 2013-11-17 ENCOUNTER — Telehealth: Payer: Self-pay | Admitting: Nurse Practitioner

## 2013-11-18 MED ORDER — FLUCONAZOLE 150 MG PO TABS: 150.0000 mg | ORAL_TABLET | Freq: Once | ORAL | Status: DC

## 2013-11-18 NOTE — Telephone Encounter (Signed)
Diflucan rx sent to pharmacy.

## 2014-01-11 ENCOUNTER — Ambulatory Visit (INDEPENDENT_AMBULATORY_CARE_PROVIDER_SITE_OTHER): Payer: Medicare Other | Admitting: Nurse Practitioner

## 2014-01-11 ENCOUNTER — Encounter: Payer: Self-pay | Admitting: Nurse Practitioner

## 2014-01-11 ENCOUNTER — Encounter (INDEPENDENT_AMBULATORY_CARE_PROVIDER_SITE_OTHER): Payer: Self-pay

## 2014-01-11 VITALS — BP 162/92 | HR 61 | Temp 98.8°F | Ht 66.0 in | Wt 126.0 lb

## 2014-01-11 DIAGNOSIS — E119 Type 2 diabetes mellitus without complications: Secondary | ICD-10-CM

## 2014-01-11 DIAGNOSIS — F411 Generalized anxiety disorder: Secondary | ICD-10-CM

## 2014-01-11 DIAGNOSIS — Z13820 Encounter for screening for osteoporosis: Secondary | ICD-10-CM

## 2014-01-11 DIAGNOSIS — Z0000 Encounter for general adult medical examination without abnormal findings: Secondary | ICD-10-CM

## 2014-01-11 DIAGNOSIS — I10 Essential (primary) hypertension: Secondary | ICD-10-CM

## 2014-01-11 DIAGNOSIS — Z01419 Encounter for gynecological examination (general) (routine) without abnormal findings: Secondary | ICD-10-CM

## 2014-01-11 DIAGNOSIS — E785 Hyperlipidemia, unspecified: Secondary | ICD-10-CM

## 2014-01-11 LAB — POCT URINALYSIS DIPSTICK
Bilirubin, UA: NEGATIVE
Blood, UA: NEGATIVE
Glucose, UA: NEGATIVE
Ketones, UA: NEGATIVE
LEUKOCYTES UA: NEGATIVE
Nitrite, UA: NEGATIVE
PROTEIN UA: NEGATIVE
Spec Grav, UA: 1.015
Urobilinogen, UA: NEGATIVE
pH, UA: 6.5

## 2014-01-11 LAB — POCT GLYCOSYLATED HEMOGLOBIN (HGB A1C): Hemoglobin A1C: 8.3

## 2014-01-11 MED ORDER — ALPRAZOLAM 0.5 MG PO TABS: ORAL_TABLET | ORAL | Status: DC

## 2014-01-11 MED ORDER — HYDROCHLOROTHIAZIDE 25 MG PO TABS: 25.0000 mg | ORAL_TABLET | Freq: Every day | ORAL | Status: DC

## 2014-01-11 NOTE — Progress Notes (Signed)
Subjective:    Patient ID: Susan Petty, female    DOB: 26-Jan-1946, 68 y.o.   MRN: [id number]  Patient is here today for annual physical with Pap ande follow up of chronic medical problems. No complaint.   Hypertension This is a chronic problem. The current episode started more than 1 year ago. The problem has been gradually improving since onset. The problem is controlled. Pertinent negatives include no blurred vision, chest pain, palpitations, peripheral edema or shortness of breath. Risk factors for coronary artery disease include diabetes mellitus and post-menopausal state. Past treatments include diuretics and beta blockers. The current treatment provides moderate improvement. There are no compliance problems.  There is no history of kidney disease, CVA or heart failure.  Hyperlipidemia This is a chronic problem. The current episode started more than 1 year ago. The problem is controlled. Exacerbating diseases include diabetes. Pertinent negatives include no chest pain, focal sensory loss or shortness of breath. The current treatment provides moderate improvement of lipids. There are no compliance problems.  Risk factors for coronary artery disease include post-menopausal, diabetes mellitus and dyslipidemia.  Diabetes She presents for her follow-up diabetic visit. No MedicAlert identification noted. Her disease course has been stable. Pertinent negatives for diabetes include no blurred vision, no chest pain, no fatigue and no weight loss. There are no hypoglycemic complications. Symptoms are stable. Pertinent negatives for diabetic complications include no CVA. Risk factors for coronary artery disease include hypertension, post-menopausal and dyslipidemia. Current diabetic treatment includes insulin injections. She is compliant with treatment all of the time. Her weight is stable. She is following a diabetic diet. She participates in exercise three times a week. There is no change in her home  blood glucose trend. Her breakfast blood glucose is taken between 7-8 am. Her breakfast blood glucose range is generally 180-200 mg/dl. Her lunch blood glucose range is generally 130-140 mg/dl. She does not see a podiatrist.Eye exam is current (Saw her eye doctoer in february).      Review of Systems  Constitutional: Negative.  Negative for weight loss and fatigue.  HENT: Negative.   Eyes: Negative.  Negative for blurred vision.  Respiratory: Negative.  Negative for shortness of breath.   Cardiovascular: Negative for chest pain and palpitations.  Endocrine: Negative.   Genitourinary: Negative.   Musculoskeletal: Negative.   Neurological: Negative.   Hematological: Negative.   Psychiatric/Behavioral: Negative.        Objective:   Physical Exam  Constitutional: She is oriented to person, place, and time. She appears well-developed and well-nourished.  HENT:  Head: Normocephalic.  Right Ear: Hearing, tympanic membrane, external ear and ear canal normal.  Left Ear: Hearing, tympanic membrane, external ear and ear canal normal.  Nose: Nose normal.  Mouth/Throat: Uvula is midline and oropharynx is clear and moist.  Eyes: Conjunctivae and EOM are normal. Pupils are equal, round, and reactive to light.  Neck: Normal range of motion and full passive range of motion without pain. Neck supple. No JVD present. Carotid bruit is not present. No mass and no thyromegaly present.  Cardiovascular: Normal rate, regular rhythm, normal heart sounds and intact distal pulses.   No murmur heard. Pulmonary/Chest: Effort normal and breath sounds normal. Right breast exhibits no inverted nipple, no mass, no nipple discharge, no skin change and no tenderness. Left breast exhibits no inverted nipple, no mass, no nipple discharge, no skin change and no tenderness.  Abdominal: Soft. Bowel sounds are normal. She exhibits no mass. There is no tenderness.  Genitourinary: Vagina normal and uterus normal. No breast  swelling, tenderness, discharge or bleeding.  bimanual exam-No adnexal masses or tenderness. Cervix parous and pink - no discharge  Musculoskeletal: Normal range of motion.  Lymphadenopathy:    She has no cervical adenopathy.  Neurological: She is alert and oriented to person, place, and time.  Skin: Skin is warm and dry.  Psychiatric: She has a normal mood and affect. Her behavior is normal. Judgment and thought content normal.   BP 162/92  Pulse 61  Temp(Src) 98.8 F (37.1 C) (Oral)  Ht '5\' 6"'  (1.676 m)  Wt 126 lb (57.153 kg)  BMI 20.35 kg/m2  Results for orders placed in visit on 01/11/14  POCT URINALYSIS DIPSTICK      Result Value Ref Range   Color, UA yellow     Clarity, UA clear     Glucose, UA neg     Bilirubin, UA neg     Ketones, UA neg     Spec Grav, UA 1.015     Blood, UA neg     pH, UA 6.5     Protein, UA neg     Urobilinogen, UA negative     Nitrite, UA neg     Leukocytes, UA Negative    POCT GLYCOSYLATED HEMOGLOBIN (HGB A1C)      Result Value Ref Range   Hemoglobin A1C 8.3%           Assessment & Plan:   1. Medicare annual wellness visit, subsequent   2. Essential hypertension   3. Hyperlipidemia   4. Encounter for routine gynecological examination   5. Type II or unspecified type diabetes mellitus without mention of complication, not stated as uncontrolled   6. GAD (generalized anxiety disorder)   7. Screening for osteoporosis    Orders Placed This Encounter  Procedures  . DG Bone Density    Standing Status: Future     Number of Occurrences:      Standing Expiration Date: 03/13/2015    Order Specific Question:  Reason for Exam (SYMPTOM  OR DIAGNOSIS REQUIRED)    Answer:  screening    Order Specific Question:  Preferred imaging location?    Answer:  Internal  . CMP14+EGFR  . NMR, lipoprofile  . POCT urinalysis dipstick  . POCT glycosylated hemoglobin (Hb A1C)   Meds ordered this encounter  Medications  . hydrochlorothiazide (HYDRODIURIL)  25 MG tablet    Sig: Take 1 tablet (25 mg total) by mouth daily.    Dispense:  30 tablet    Refill:  5    Order Specific Question:  Supervising Provider    Answer:  Chipper Herb [[id number]]  . ALPRAZolam (XANAX) 0.5 MG tablet    Sig: TAKE ONE TABLET BY MOUTH AT BEDTIME AS NEEDED FOR SLEEP    Dispense:  30 tablet    Refill:  2    Order Specific Question:  Supervising Provider    Answer:  Chipper Herb [[id number]]  will schedule dexa scan Refuses pneumo and zostavax vaccine Labs pending Changed sliding scale - to be used with each meal- recheck Hgba1c in 1  month Health maintenance reviewed Diet and exercise encouraged Continue all meds Follow up  In 1 months   [place], FNP

## 2014-01-11 NOTE — Patient Instructions (Addendum)
Breakfast , lunch and supper -  BG less than 80: none,  BG 80-120: 2 untis,  BG 121-170: 4 units,  BG 171-220: 6 units,  BG 221-270: 8 units,  BG 271 - 300: 10 units,  BG 301 or greater: 12 units

## 2014-01-12 LAB — CMP14+EGFR
ALT: 26 IU/L (ref 0–32)
AST: 26 IU/L (ref 0–40)
Albumin/Globulin Ratio: 2.1 (ref 1.1–2.5)
Albumin: 4 g/dL (ref 3.5–4.8)
Alkaline Phosphatase: 113 IU/L (ref 39–117)
BUN/Creatinine Ratio: 14 (ref 11–26)
BUN: 11 mg/dL (ref 8–27)
CALCIUM: 9.2 mg/dL (ref 8.7–10.3)
CO2: 30 mmol/L — ABNORMAL HIGH (ref 18–29)
Chloride: 98 mmol/L (ref 97–108)
Creatinine, Ser: 0.77 mg/dL (ref 0.57–1.00)
GFR calc Af Amer: 90 mL/min/{1.73_m2} (ref >59)
GFR calc non Af Amer: 78 mL/min/{1.73_m2} (ref >59)
Globulin, Total: 1.9 g/dL (ref 1.5–4.5)
Glucose: 290 mg/dL — ABNORMAL HIGH (ref 65–99)
POTASSIUM: 4.3 mmol/L (ref 3.5–5.2)
Sodium: 142 mmol/L (ref 134–144)
Total Bilirubin: 0.2 mg/dL (ref 0.0–1.2)
Total Protein: 5.9 g/dL — ABNORMAL LOW (ref 6.0–8.5)

## 2014-01-12 LAB — NMR, LIPOPROFILE
Cholesterol: 193 mg/dL (ref 100–199)
HDL Cholesterol by NMR: 76 mg/dL (ref >39)
HDL Particle Number: 39.9 umol/L (ref >=30.5)
LDL PARTICLE NUMBER: 1092 nmol/L — AB (ref <1000)
LDL SIZE: 21.4 nm (ref >20.5)
LDLC SERPL CALC-MCNC: 101 mg/dL — ABNORMAL HIGH (ref 0–99)
LP-IR Score: <25 (ref <=45)
Small LDL Particle Number: <90 nmol/L (ref <=527)
Triglycerides by NMR: 82 mg/dL (ref 0–149)

## 2014-01-13 LAB — PAP IG (IMAGE GUIDED): PAP Smear Comment: 0

## 2014-02-04 ENCOUNTER — Ambulatory Visit: Payer: Medicare Other | Admitting: Nurse Practitioner

## 2014-04-13 ENCOUNTER — Ambulatory Visit (INDEPENDENT_AMBULATORY_CARE_PROVIDER_SITE_OTHER): Payer: Medicare Other | Admitting: Pharmacist

## 2014-04-13 ENCOUNTER — Ambulatory Visit (INDEPENDENT_AMBULATORY_CARE_PROVIDER_SITE_OTHER): Payer: Medicare Other

## 2014-04-13 ENCOUNTER — Encounter: Payer: Self-pay | Admitting: Pharmacist

## 2014-04-13 VITALS — Ht 64.0 in | Wt 127.0 lb

## 2014-04-13 DIAGNOSIS — Z23 Encounter for immunization: Secondary | ICD-10-CM

## 2014-04-13 DIAGNOSIS — E119 Type 2 diabetes mellitus without complications: Secondary | ICD-10-CM

## 2014-04-13 DIAGNOSIS — M8000XA Age-related osteoporosis with current pathological fracture, unspecified site, initial encounter for fracture: Secondary | ICD-10-CM | POA: Insufficient documentation

## 2014-04-13 DIAGNOSIS — Z13820 Encounter for screening for osteoporosis: Secondary | ICD-10-CM

## 2014-04-13 DIAGNOSIS — M8000XD Age-related osteoporosis with current pathological fracture, unspecified site, subsequent encounter for fracture with routine healing: Secondary | ICD-10-CM

## 2014-04-13 DIAGNOSIS — Z794 Long term (current) use of insulin: Secondary | ICD-10-CM

## 2014-04-13 LAB — HM DEXA SCAN

## 2014-04-13 MED ORDER — ALENDRONATE SODIUM 70 MG PO TABS: 70.0000 mg | ORAL_TABLET | ORAL | Status: DC

## 2014-04-13 NOTE — Progress Notes (Signed)
Patient ID: GAL SMOLINSKI, female   DOB: March 15, 1946, 68 y.o.   MRN: [id number]  [facility] Current Height: Height: 5\' 4"  (162.6 cm)      Max Lifetime Height:  5\' 6"  Current Weight: Weight: 127 lb (57.607 kg)       Ethnicity:Caucasian    HPI: Does pt already have a diagnosis of:  Osteopenia?  No Osteoporosis?  No  Back Pain?  Yes       Kyphosis?  No Prior fracture?  Yes - hip 4 years ago - happened while playing backetball Med(s) for Osteoporosis/Osteopenia:  none Med(s) previously tried for Osteoporosis/Osteopenia:  None  I also discussed type 2 DM which patient is currently treating with NPH insulin 25 units qam and 10 units qpm  And Regular insulin 10 units tid prior to meals  Last A1c was 8.3%.  Patient reports variable BG readings.  She is most concerned about hypoglycemic events that occur most often either around 1-2pm in afternoon or 2-3 am during sleep.                                                             PMH: Age at menopause:  71's Hysterectomy?  No Oophorectomy?  no HRT? Yes - Former.  Type/duration: unsure Steroid Use?  No Thyroid med?  No History of cancer?  Yes - skin cancer History of digestive disorders (ie Crohn's)?  Yes - on chronic PPI Current or previous eating disorders?  No Last Vitamin D Result:  37 (05/2012) Last GFR Result:  78 (01/11/2014)   FH/SH: Family history of osteoporosis?  No Parent with history of hip fracture?  No Family history of breast cancer?  No Exercise?  Yes - walks Smoking?  No Alcohol?  No    Calcium Assessment Calcium Intake  # of servings/day  Calcium mg  Milk (8 oz) 0  x  300  = 0  Yogurt (4 oz) 1 x  200 = 200mg   Cheese (1 oz) 1 x  200 = 200mg   Other Calcium sources   250mg   Ca supplement 0 = 0   Estimated calcium intake per day 650mg     DEXA Results Date of Test T-Score for AP Spine L1-L4 T-Score for Total Left Hip T-Score for Total Right Hip  04/13/2014 -2.5 -- -2.7                     Assessment: Osteoporosis Type 2 DM requiring insulin - not adequately controlled with hypoglycemic events.  Recommendations: 1.  Start  alendronate (FOSAMAX) 70mg  1 tablet weekly 2.  recommend calcium 1200mg  daily through supplementation or diet.  3.  recommend weight bearing exercise - 30 minutes at least 4 days per week.   4.  Counseled and educated about fall risk and prevention. 5.  Discussed pros and cons of NPH vs. Once daily insulin such as Levemir, Lantus or Toujeo.  Patient has agreed to try if we have samples.  I currently do not have samples of long acting insulin but will contact patient once we get some.  I think this switch might improve BG control. 6.   Influenza vaccine given in office today   Recheck DEXA:  2 years  Time spent counseling patient:  40 minutes  Cherre Robins, PharmD, CPP,  CDE

## 2014-04-13 NOTE — Patient Instructions (Addendum)
Diabetes and Standards of Medical Care  Diabetes is complicated. You may find that your diabetes team includes a dietitian, nurse, diabetes educator, eye doctor, and more. To help everyone know what is going on and to help you get the care you deserve, the following schedule of care was developed to help keep you on track. Below are the tests, exams, vaccines, medicines, education, and plans you will need.  Blood Glucose Goals Prior to meals = 80 - 130 Within 2 hours of the start of a meal = less than 180  HbA1c test (goal is less than 7.0% - your last value was 8.3%) This test shows how well you have controlled your glucose over the past 2 3 months. It is used to see if your diabetes management plan needs to be adjusted.   It is performed at least 2 times a year if you are meeting treatment goals.  It is performed 4 times a year if therapy has changed or if you are not meeting treatment goals.   Blood pressure test  This test is performed at every routine medical visit. The goal is less than 140/90 mmHg for most people, but 130/80 mmHg in some cases. Ask your health care provider about your goal. Dental exam  Follow up with the dentist regularly. Eye exam  If you are diagnosed with type 1 diabetes as a child, get an exam upon reaching the age of 41 years or older and have had diabetes for 3 5 years. Yearly eye exams are recommended after that initial eye exam.  If you are diagnosed with type 1 diabetes as an adult, get an exam within 5 years of diagnosis and then yearly.  If you are diagnosed with type 2 diabetes, get an exam as soon as possible after the diagnosis and then yearly. Foot care exam  Visual foot exams are performed at every routine medical visit. The exams check for cuts, injuries, or other problems with the feet.  A comprehensive foot exam should be done yearly. This includes visual inspection as well as assessing foot pulses and testing for loss of sensation.  Check  your feet nightly for cuts, injuries, or other problems with your feet. Tell your health care provider if anything is not healing. Kidney function test (urine microalbumin)  This test is performed once a year.  Type 1 diabetes: The first test is performed 5 years after diagnosis.  Type 2 diabetes: The first test is performed at the time of diagnosis.  A serum creatinine and estimated glomerular filtration rate (eGFR) test is done once a year to assess the level of chronic kidney disease (CKD), if present. Lipid profile (cholesterol, HDL, LDL, triglycerides)  Performed every 5 years for most people.  The goal for LDL is less than 100 mg/dL. If you are at high risk, the goal is less than 70 mg/dL.  The goal for HDL is 40 mg/dL 50 mg/dL for men and 50 mg/dL 60 mg/dL for women. An HDL cholesterol of 60 mg/dL or higher gives some protection against heart disease.  The goal for triglycerides is less than 150 mg/dL. Influenza vaccine, pneumococcal vaccine, and hepatitis B vaccine  The influenza vaccine is recommended yearly.  The pneumococcal vaccine is generally given once in a lifetime. However, there are some instances when another vaccination is recommended. Check with your health care provider.  The hepatitis B vaccine is also recommended for adults with diabetes. Diabetes self-management education  Education is recommended at diagnosis and ongoing  as needed. Treatment plan  Your treatment plan is reviewed at every medical visit. Document Released: 04/21/2009 Document Revised: 02/24/2013 Document Reviewed: 11/24/2012 [facility] Patient Information 2014 [place].    Fall Prevention and Home Safety Falls cause injuries and can affect all age groups. It is possible to use preventive measures to significantly decrease the likelihood of falls. There are many simple measures which can make your home safer and prevent falls. OUTDOORS  Repair cracks and edges of walkways and  driveways.  Remove high doorway thresholds.  Trim shrubbery on the main path into your home.  Have good outside lighting.  Clear walkways of tools, rocks, debris, and clutter.  Check that handrails are not broken and are securely fastened. Both sides of steps should have handrails.  Have leaves, snow, and ice cleared regularly.  Use sand or salt on walkways during winter months.  In the garage, clean up grease or oil spills. BATHROOM  Install night lights.  Install grab bars by the toilet and in the tub and shower.  Use non-skid mats or decals in the tub or shower.  Place a plastic non-slip stool in the shower to sit on, if needed.  Keep floors dry and clean up all water on the floor immediately.  Remove soap buildup in the tub or shower on a regular basis.  Secure bath mats with non-slip, double-sided rug tape.  Remove throw rugs and tripping hazards from the floors. BEDROOMS  Install night lights.  Make sure a bedside light is easy to reach.  Do not use oversized bedding.  Keep a telephone by your bedside.  Have a firm chair with side arms to use for getting dressed.  Remove throw rugs and tripping hazards from the floor. KITCHEN  Keep handles on pots and pans turned toward the center of the stove. Use back burners when possible.  Clean up spills quickly and allow time for drying.  Avoid walking on wet floors.  Avoid hot utensils and knives.  Position shelves so they are not too high or low.  Place commonly used objects within easy reach.  If necessary, use a sturdy step stool with a grab bar when reaching.  Keep electrical cables out of the way.  Do not use floor polish or wax that makes floors slippery. If you must use wax, use non-skid floor wax.  Remove throw rugs and tripping hazards from the floor. STAIRWAYS  Never leave objects on stairs.  Place handrails on both sides of stairways and use them. Fix any loose handrails. Make sure  handrails on both sides of the stairways are as long as the stairs.  Check carpeting to make sure it is firmly attached along stairs. Make repairs to worn or loose carpet promptly.  Avoid placing throw rugs at the top or bottom of stairways, or properly secure the rug with carpet tape to prevent slippage. Get rid of throw rugs, if possible.  Have an electrician put in a light switch at the top and bottom of the stairs. OTHER FALL PREVENTION TIPS  Wear low-heel or rubber-soled shoes that are supportive and fit well. Wear closed toe shoes.  When using a stepladder, make sure it is fully opened and both spreaders are firmly locked. Do not climb a closed stepladder.  Add color or contrast paint or tape to grab bars and handrails in your home. Place contrasting color strips on first and last steps.  Learn and use mobility aids as needed. Install an electrical emergency response system.  Turn on lights to avoid dark areas. Replace light bulbs that burn out immediately. Get light switches that glow.  Arrange furniture to create clear pathways. Keep furniture in the same place.  Firmly attach carpet with non-skid or double-sided tape.  Eliminate uneven floor surfaces.  Select a carpet pattern that does not visually hide the edge of steps.  Be aware of all pets. OTHER HOME SAFETY TIPS  Set the water temperature for 120 F (48.8 C).  Keep emergency numbers on or near the telephone.  Keep smoke detectors on every level of the home and near sleeping areas. Document Released: 06/14/2002 Document Revised: 12/24/2011 Document Reviewed: 09/13/2011 [facility] Patient Information 2015 [place], [place]. This information is not intended to replace advice given to you by your health care provider. Make sure you discuss any questions you have with your health care provider.               Exercise for Strong Bones  Exercise is important to build and maintain strong bones / bone density.  There are 2  types of exercises that are important to building and maintaining strong bones:  Weight- bearing and muscle-stregthening.  Weight-bearing Exercises  These exercises include activities that make you move against gravity while staying upright. Weight-bearing exercises can be high-impact or low-impact.  High-impact weight-bearing exercises help build bones and keep them strong. If you have broken a bone due to osteoporosis or are at risk of breaking a bone, you may need to avoid high-impact exercises. If you're not sure, you should check with your healthcare provider.  Examples of high-impact weight-bearing exercises are: Dancing  Doing high-impact aerobics  Hiking  Jogging/running  Jumping Rope  Stair climbing  Tennis  Low-impact weight-bearing exercises can also help keep bones strong and are a safe alternative if you cannot do high-impact exercises.   Examples of low-impact weight-bearing exercises are: Using elliptical training machines  Doing low-impact aerobics  Using stair-step machines  Fast walking on a treadmill or outside   Muscle-Strengthening Exercises These exercises include activities where you move your body, a weight or some other resistance against gravity. They are also known as resistance exercises and include: Lifting weights  Using elastic exercise bands  Using weight machines  Lifting your own body weight  Functional movements, such as standing and rising up on your toes  Yoga and Pilates can also improve strength, balance and flexibility. However, certain positions may not be safe for people with osteoporosis or those at increased risk of broken bones. For example, exercises that have you bend forward may increase the chance of breaking a bone in the spine.   Non-Impact Exercises There are other types of exercises that can help prevent falls.  Non-impact exercises can help you to improve balance, posture and how well you move in everyday activities. Some of  these exercises include: Balance exercises that strengthen your legs and test your balance, such as Tai Chi, can decrease your risk of falls.  Posture exercises that improve your posture and reduce rounded or "sloping" shoulders can help you decrease the chance of breaking a bone, especially in the spine.  Functional exercises that improve how well you move can help you with everyday activities and decrease your chance of falling and breaking a bone. For example, if you have trouble getting up from a chair or climbing stairs, you should do these activities as exercises.   **A physical therapist can teach you balance, posture and functional exercises. He/she can also help  you learn which exercises are safe and appropriate for you.  [facility] has a physical therapy office in [place] in front of our office and referrals can be made for assessments and treatment as needed and strength and balance training.  If you would like to have an assessment with [facility] and our physical therapy team please let a nurse or provider know.

## 2014-04-13 NOTE — Addendum Note (Signed)
Addended by: Cherre Robins on: 04/13/2014 11:10 AM   Modules accepted: Level of Service

## 2014-06-06 ENCOUNTER — Other Ambulatory Visit: Payer: Self-pay | Admitting: General Practice

## 2014-06-06 NOTE — Telephone Encounter (Signed)
Refilled per protocol, last visit in July.

## 2014-06-14 ENCOUNTER — Other Ambulatory Visit: Payer: Self-pay | Admitting: *Deleted

## 2014-06-14 MED ORDER — OMEPRAZOLE 40 MG PO CPDR: 40.0000 mg | DELAYED_RELEASE_CAPSULE | Freq: Every day | ORAL | Status: DC

## 2014-07-13 DIAGNOSIS — Z85828 Personal history of other malignant neoplasm of skin: Secondary | ICD-10-CM | POA: Diagnosis not present

## 2014-07-13 DIAGNOSIS — D0462 Carcinoma in situ of skin of left upper limb, including shoulder: Secondary | ICD-10-CM | POA: Diagnosis not present

## 2014-07-13 DIAGNOSIS — Z08 Encounter for follow-up examination after completed treatment for malignant neoplasm: Secondary | ICD-10-CM | POA: Diagnosis not present

## 2014-07-13 DIAGNOSIS — X32XXXD Exposure to sunlight, subsequent encounter: Secondary | ICD-10-CM | POA: Diagnosis not present

## 2014-07-13 DIAGNOSIS — L570 Actinic keratosis: Secondary | ICD-10-CM | POA: Diagnosis not present

## 2014-07-21 DIAGNOSIS — I10 Essential (primary) hypertension: Secondary | ICD-10-CM | POA: Diagnosis not present

## 2014-07-21 DIAGNOSIS — Z96649 Presence of unspecified artificial hip joint: Secondary | ICD-10-CM | POA: Diagnosis not present

## 2014-07-21 DIAGNOSIS — E119 Type 2 diabetes mellitus without complications: Secondary | ICD-10-CM | POA: Diagnosis not present

## 2014-07-28 ENCOUNTER — Telehealth: Payer: Self-pay | Admitting: Pharmacist

## 2014-07-28 DIAGNOSIS — F411 Generalized anxiety disorder: Secondary | ICD-10-CM

## 2014-07-28 NOTE — Telephone Encounter (Signed)
Called  Patient to follow up DM readings and adjust insulin.  She is currently taking NPH Insulin 25 units qam and 10 units qpm.  She is also taking Regular Insulin 10 units prior to meal TID.  She reports that she is having lows and is eating a lot to correct.  She is concerned about her weight.  Changed insulin - NPH 25 units qam and 8 units qpm Regular insulin change to 8 units prior to each meal.  I also instructed patient to check BG prior to each meal and if BGis 80 or less to hold that meal's insulin.

## 2014-08-08 ENCOUNTER — Other Ambulatory Visit: Payer: Self-pay | Admitting: Nurse Practitioner

## 2014-08-09 NOTE — Telephone Encounter (Signed)
Please call in xanax with 0 refills no more refills without being seen  

## 2014-08-09 NOTE — Telephone Encounter (Signed)
Last seen 01/11/14 MMM   Has upcoming appt 08/29/14  If approved route to nurse to call into [facility]

## 2014-08-10 ENCOUNTER — Other Ambulatory Visit: Payer: Self-pay | Admitting: Nurse Practitioner

## 2014-08-10 DIAGNOSIS — H40033 Anatomical narrow angle, bilateral: Secondary | ICD-10-CM | POA: Diagnosis not present

## 2014-08-10 DIAGNOSIS — E119 Type 2 diabetes mellitus without complications: Secondary | ICD-10-CM | POA: Diagnosis not present

## 2014-08-10 NOTE — Telephone Encounter (Signed)
Called into pharmacy

## 2014-08-11 NOTE — Telephone Encounter (Signed)
Please call in xanax with 0 refills 

## 2014-08-11 NOTE — Telephone Encounter (Signed)
Called into pharmacy

## 2014-08-11 NOTE — Telephone Encounter (Signed)
Last filled 06/06/14, last seen 01/11/14. Pt uses [facility]

## 2014-08-29 ENCOUNTER — Ambulatory Visit (INDEPENDENT_AMBULATORY_CARE_PROVIDER_SITE_OTHER): Payer: Medicare Other | Admitting: Nurse Practitioner

## 2014-08-29 ENCOUNTER — Encounter: Payer: Self-pay | Admitting: Nurse Practitioner

## 2014-08-29 VITALS — BP 136/82 | HR 52 | Temp 97.9°F | Ht 64.0 in | Wt 129.0 lb

## 2014-08-29 DIAGNOSIS — E785 Hyperlipidemia, unspecified: Secondary | ICD-10-CM

## 2014-08-29 DIAGNOSIS — K219 Gastro-esophageal reflux disease without esophagitis: Secondary | ICD-10-CM

## 2014-08-29 DIAGNOSIS — F411 Generalized anxiety disorder: Secondary | ICD-10-CM

## 2014-08-29 DIAGNOSIS — I10 Essential (primary) hypertension: Secondary | ICD-10-CM | POA: Diagnosis not present

## 2014-08-29 DIAGNOSIS — E119 Type 2 diabetes mellitus without complications: Secondary | ICD-10-CM | POA: Diagnosis not present

## 2014-08-29 DIAGNOSIS — E876 Hypokalemia: Secondary | ICD-10-CM

## 2014-08-29 DIAGNOSIS — Z794 Long term (current) use of insulin: Principal | ICD-10-CM

## 2014-08-29 DIAGNOSIS — M8000XD Age-related osteoporosis with current pathological fracture, unspecified site, subsequent encounter for fracture with routine healing: Secondary | ICD-10-CM | POA: Diagnosis not present

## 2014-08-29 LAB — POCT GLYCOSYLATED HEMOGLOBIN (HGB A1C): Hemoglobin A1C: 8.2

## 2014-08-29 LAB — POCT UA - MICROALBUMIN: MICROALBUMIN (UR) POC: NEGATIVE mg/L

## 2014-08-29 MED ORDER — ALENDRONATE SODIUM 70 MG PO TABS: 70.0000 mg | ORAL_TABLET | ORAL | Status: DC

## 2014-08-29 MED ORDER — BENAZEPRIL HCL 40 MG PO TABS: 40.0000 mg | ORAL_TABLET | Freq: Every day | ORAL | Status: DC

## 2014-08-29 MED ORDER — METOPROLOL TARTRATE 50 MG PO TABS: 50.0000 mg | ORAL_TABLET | Freq: Two times a day (BID) | ORAL | Status: DC

## 2014-08-29 MED ORDER — HYDROCHLOROTHIAZIDE 25 MG PO TABS: 25.0000 mg | ORAL_TABLET | Freq: Every day | ORAL | Status: DC

## 2014-08-29 MED ORDER — OMEPRAZOLE 40 MG PO CPDR: 40.0000 mg | DELAYED_RELEASE_CAPSULE | Freq: Every day | ORAL | Status: DC

## 2014-08-29 NOTE — Patient Instructions (Signed)

## 2014-08-29 NOTE — Progress Notes (Signed)
Subjective:    Patient ID: Susan Petty, female    DOB: [birth year], 69 y.o.   MRN: [id number]  Patient here today for follow up of chronic medical problems.\  Hypertension This is a chronic problem. The current episode started more than 1 year ago. The problem is unchanged. The problem is controlled. Pertinent negatives include no chest pain, palpitations or shortness of breath. Risk factors for coronary artery disease include diabetes mellitus, dyslipidemia, post-menopausal state and sedentary lifestyle. Past treatments include beta blockers, ACE inhibitors and diuretics. The current treatment provides moderate improvement. Compliance problems include diet and exercise.  Hypertensive end-organ damage includes CAD/MI.  Hyperlipidemia This is a chronic problem. The current episode started more than 1 year ago. Recent lipid tests were reviewed and are variable. Pertinent negatives include no chest pain or shortness of breath. She is currently on no antihyperlipidemic treatment. The current treatment provides mild improvement of lipids. Compliance problems include adherence to diet and adherence to exercise.  Risk factors for coronary artery disease include diabetes mellitus, dyslipidemia, hypertension, post-menopausal and a sedentary lifestyle.  Diabetes She presents for her follow-up diabetic visit. She has gestational diabetes mellitus. No MedicAlert identification noted. Her disease course has been fluctuating. There are no hypoglycemic associated symptoms. Pertinent negatives for diabetes include no chest pain and no fatigue. There are no hypoglycemic complications. Symptoms are stable. There are no diabetic complications. Risk factors for coronary artery disease include diabetes mellitus, dyslipidemia, hypertension, post-menopausal and sedentary lifestyle. Current diabetic treatment includes insulin injections. Her weight is stable. When asked about meal planning, she reported none. She has not had  a previous visit with a dietitian. She rarely participates in exercise. There is no change in her home blood glucose trend. Her breakfast blood glucose is taken between 8-9 am. Her breakfast blood glucose range is generally 130-140 mg/dl. Her overall blood glucose range is 130-140 mg/dl. An ACE inhibitor/angiotensin II receptor blocker is being taken. She does not see a podiatrist.Eye exam is not current.  GERD Taking omeprazole OTC daily- keeps symptoms under control Osteoporosis Fosamax weekly- no side effects- no c/o of back pain.  Review of Systems  Constitutional: Negative.  Negative for fatigue.  HENT: Negative.   Eyes: Negative.   Respiratory: Negative.  Negative for shortness of breath.   Cardiovascular: Negative for chest pain and palpitations.  Endocrine: Negative.   Genitourinary: Negative.   Musculoskeletal: Negative.   Neurological: Negative.   Hematological: Negative.   Psychiatric/Behavioral: Negative.        Objective:   Physical Exam  Constitutional: She is oriented to person, place, and time. She appears well-developed and well-nourished.  HENT:  Head: Normocephalic.  Right Ear: Hearing, tympanic membrane, external ear and ear canal normal.  Left Ear: Hearing, tympanic membrane, external ear and ear canal normal.  Nose: Nose normal.  Mouth/Throat: Uvula is midline and oropharynx is clear and moist.  Eyes: Conjunctivae and EOM are normal. Pupils are equal, round, and reactive to light.  Neck: Normal range of motion and full passive range of motion without pain. Neck supple. No JVD present. Carotid bruit is not present. No thyroid mass and no thyromegaly present.  Cardiovascular: Normal rate, regular rhythm, normal heart sounds and intact distal pulses.   No murmur heard. Pulmonary/Chest: Effort normal and breath sounds normal. Right breast exhibits no inverted nipple, no mass, no nipple discharge, no skin change and no tenderness. Left breast exhibits no inverted  nipple, no mass, no nipple discharge, no skin change and  no tenderness.  Abdominal: Soft. Bowel sounds are normal. She exhibits no mass. There is no tenderness.  Genitourinary: Vagina normal and uterus normal. No breast swelling, tenderness, discharge or bleeding.  bimanual exam-No adnexal masses or tenderness. Cervix parous and pink - no discharge  Musculoskeletal: Normal range of motion.  Lymphadenopathy:    She has no cervical adenopathy.  Neurological: She is alert and oriented to person, place, and time.  Skin: Skin is warm and dry.  Psychiatric: She has a normal mood and affect. Her behavior is normal. Judgment and thought content normal.   BP 136/82 mmHg  Pulse 52  Temp(Src) 97.9 F (36.6 C) (Oral)  Ht '5\' 4"'  (1.626 m)  Wt 129 lb (58.514 kg)  BMI 22.13 kg/m2  Results for orders placed or performed in visit on 08/29/14  POCT glycosylated hemoglobin (Hb A1C)  Result Value Ref Range   Hemoglobin A1C 8.2%   POCT UA - Microalbumin  Result Value Ref Range   Microalbumin Ur, POC negative mg/L           Assessment & Plan:  1. Type 2 diabetes mellitus with insulin therapy Difficult to adjust humulin n due to different times of hypoglycemia I think patient will do better on lantus- will make appointment wit T. Eckard to discuss Strict carb counting - POCT glycosylated hemoglobin (Hb A1C) - POCT UA - Microalbumin  2. Essential hypertension Do not add slat to diet - CMP14+EGFR - hydrochlorothiazide (HYDRODIURIL) 25 MG tablet; Take 1 tablet (25 mg total) by mouth daily.  Dispense: 30 tablet; Refill: 5 - benazepril (LOTENSIN) 40 MG tablet; Take 1 tablet (40 mg total) by mouth daily.  Dispense: 30 tablet; Refill: 5 - metoprolol (LOPRESSOR) 50 MG tablet; Take 1 tablet (50 mg total) by mouth 2 (two) times daily.  Dispense: 60 tablet; Refill: 5  3. Hyperlipidemia Low fat diet - NMR, lipoprofile  4. Gastroesophageal reflux disease without esophagitis Avoid spicy foods Do not  2 hours prior to bedtime - omeprazole (PRILOSEC) 40 MG capsule; Take 1 capsule (40 mg total) by mouth daily.  Dispense: 30 capsule; Refill: 5  5. Osteoporosis with pathological fracture, with routine healing, subsequent encounter Weight bearing exercises - alendronate (FOSAMAX) 70 MG tablet; Take 1 tablet (70 mg total) by mouth once a week. Take with a full glass of water on an empty stomach.  Dispense: 4 tablet; Refill: 2  6. Hypokalemia  7. GAD (generalized anxiety disorder) Stress management    Labs pending Health maintenance reviewed Diet and exercise encouraged Continue all meds Follow up  In 3 months   [place], FNP

## 2014-08-30 ENCOUNTER — Telehealth: Payer: Self-pay | Admitting: Nurse Practitioner

## 2014-08-30 LAB — NMR, LIPOPROFILE
Cholesterol: 199 mg/dL (ref 100–199)
HDL CHOLESTEROL BY NMR: 77 mg/dL (ref >39)
HDL PARTICLE NUMBER: 37.8 umol/L (ref >=30.5)
LDL Particle Number: 1026 nmol/L — ABNORMAL HIGH (ref <1000)
LDL Size: 21.6 nm (ref >20.5)
LDL-C: 102 mg/dL — ABNORMAL HIGH (ref 0–99)
LP-IR Score: <25 (ref <=45)
TRIGLYCERIDES BY NMR: 102 mg/dL (ref 0–149)

## 2014-08-30 LAB — CMP14+EGFR
ALT: 18 IU/L (ref 0–32)
AST: 25 IU/L (ref 0–40)
Albumin/Globulin Ratio: 2 (ref 1.1–2.5)
Albumin: 4.2 g/dL (ref 3.5–4.8)
Alkaline Phosphatase: 93 IU/L (ref 39–117)
BUN/Creatinine Ratio: 17 (ref 11–26)
BUN: 12 mg/dL (ref 8–27)
Bilirubin Total: 0.4 mg/dL (ref 0.0–1.2)
CALCIUM: 9.6 mg/dL (ref 8.7–10.3)
CHLORIDE: 100 mmol/L (ref 97–108)
CO2: 31 mmol/L — AB (ref 18–29)
Creatinine, Ser: 0.71 mg/dL (ref 0.57–1.00)
GFR calc Af Amer: 100 mL/min/{1.73_m2} (ref >59)
GFR calc non Af Amer: 87 mL/min/{1.73_m2} (ref >59)
GLUCOSE: 91 mg/dL (ref 65–99)
Globulin, Total: 2.1 g/dL (ref 1.5–4.5)
POTASSIUM: 4.9 mmol/L (ref 3.5–5.2)
Sodium: 144 mmol/L (ref 134–144)
TOTAL PROTEIN: 6.3 g/dL (ref 6.0–8.5)

## 2014-08-30 NOTE — Telephone Encounter (Signed)
-----   Message from [facility], [place] sent at 08/30/2014  9:45 AM EST ----- Hgba1c discussed at appointment- has appointment with clinical pharmacist to discuss changing to lantus microalbumin negative Kidney and liver function stable Cholesterol looks ok Continue current meds- low fat diet and exercise and recheck in 3 months

## 2014-08-31 ENCOUNTER — Encounter: Payer: Self-pay | Admitting: Nurse Practitioner

## 2014-08-31 ENCOUNTER — Ambulatory Visit: Payer: Medicare Other

## 2014-08-31 NOTE — Telephone Encounter (Signed)
Patient aware.

## 2014-09-01 ENCOUNTER — Ambulatory Visit (INDEPENDENT_AMBULATORY_CARE_PROVIDER_SITE_OTHER): Payer: Medicare Other | Admitting: Family Medicine

## 2014-09-01 ENCOUNTER — Encounter: Payer: Self-pay | Admitting: Family Medicine

## 2014-09-01 VITALS — BP 162/69 | HR 55 | Temp 97.2°F | Ht 64.0 in | Wt 129.0 lb

## 2014-09-01 DIAGNOSIS — R05 Cough: Secondary | ICD-10-CM | POA: Diagnosis not present

## 2014-09-01 DIAGNOSIS — R059 Cough, unspecified: Secondary | ICD-10-CM

## 2014-09-01 DIAGNOSIS — J029 Acute pharyngitis, unspecified: Secondary | ICD-10-CM

## 2014-09-01 DIAGNOSIS — J329 Chronic sinusitis, unspecified: Secondary | ICD-10-CM | POA: Diagnosis not present

## 2014-09-01 LAB — POCT INFLUENZA A/B
Influenza A, POC: NEGATIVE
Influenza B, POC: NEGATIVE

## 2014-09-01 LAB — POCT RAPID STREP A (OFFICE): RAPID STREP A SCREEN: NEGATIVE

## 2014-09-01 MED ORDER — AMOXICILLIN 500 MG PO CAPS: 500.0000 mg | ORAL_CAPSULE | Freq: Three times a day (TID) | ORAL | Status: DC

## 2014-09-01 NOTE — Patient Instructions (Signed)
Drink plenty of fluids Continue to use Mucinex maximum strength, blue and white in color, 1 twice daily for cough and congestion with a large glass of water Keep house as cool as possible Use a cool mist humidifier Take Tylenol for aches pains and fever Take antibiotic as directed and use nasal saline for head congestion frequently during the day and before going to bed at night

## 2014-09-01 NOTE — Progress Notes (Signed)
   Subjective:    Patient ID: Susan Petty, female    DOB: May 21, 1946, 69 y.o.   MRN: [id number]  HPI  Patient is here for sinus pressure, sore throat, headache, drainage and bilateral ear pain that started on Monday.  The patient is an insulin-dependent diabetic. She also has a history of hypertension and hyperlipidemia.           Review of Systems  Constitutional: Negative for fever.  HENT: Positive for ear pain (bilateral), postnasal drip and sore throat.   Respiratory: Positive for cough.   Neurological: Positive for headaches.       Objective:   Physical Exam  Constitutional: She is oriented to person, place, and time. No distress.  Patient is tiny framed but alert  HENT:  Head: Normocephalic and atraumatic.  Right Ear: External ear normal.  Left Ear: External ear normal.  Minimal nasal congestion bilaterally and throat was slightly red posteriorly.  Eyes: Conjunctivae and EOM are normal. Pupils are equal, round, and reactive to light. Right eye exhibits no discharge. Left eye exhibits no discharge. No scleral icterus.  Neck: Normal range of motion. Neck supple. No JVD present. No thyromegaly present.  No anterior cervical adenopathy  Cardiovascular: Normal rate, regular rhythm and normal heart sounds.   No murmur heard. Pulmonary/Chest: Effort normal and breath sounds normal. She has no wheezes. She has no rales.  Dry cough and chest was clear anteriorly and posteriorly  Abdominal: Bowel sounds are normal. She exhibits no mass.  Musculoskeletal: Normal range of motion.  Lymphadenopathy:    She has no cervical adenopathy.  Neurological: She is alert and oriented to person, place, and time.  Skin: Skin is warm and dry. No rash noted.  Psychiatric: She has a normal mood and affect. Her behavior is normal. Judgment and thought content normal.  Nursing note and vitals reviewed.  BP 162/69 mmHg  Pulse 55  Temp(Src) 97.2 F (36.2 C) (Oral)  Ht 5\' 4"  (1.626 m)  Wt  129 lb (58.514 kg)  BMI 22.13 kg/m2  Results for orders placed or performed in visit on 09/01/14  POCT Influenza A/B  Result Value Ref Range   Influenza A, POC Negative    Influenza B, POC Negative   POCT rapid strep A  Result Value Ref Range   Rapid Strep A Screen Negative Negative         Assessment & Plan:  1. Sore throat -Gargle with warm salty water and drink plenty of fluids - POCT Influenza A/B - POCT rapid strep A  2. Cough -Take Mucinex as directed - POCT Influenza A/B - POCT rapid strep A  3. Rhinosinusitis -Use nasal saline frequently through the day in each nostril - amoxicillin (AMOXIL) 500 MG capsule; Take 1 capsule (500 mg total) by mouth 3 (three) times daily.  Dispense: 30 capsule; Refill: 0  Patient Instructions  Drink plenty of fluids Continue to use Mucinex maximum strength, blue and white in color, 1 twice daily for cough and congestion with a large glass of water Keep house as cool as possible Use a cool mist humidifier Take Tylenol for aches pains and fever Take antibiotic as directed and use nasal saline for head congestion frequently during the day and before going to bed at night    Arrie Senate MD

## 2014-09-07 ENCOUNTER — Encounter: Payer: Self-pay | Admitting: Pharmacist

## 2014-09-07 ENCOUNTER — Ambulatory Visit (INDEPENDENT_AMBULATORY_CARE_PROVIDER_SITE_OTHER): Payer: Medicare Other | Admitting: Pharmacist

## 2014-09-07 VITALS — BP 138/52 | HR 66 | Ht 64.0 in | Wt 129.0 lb

## 2014-09-07 DIAGNOSIS — Z794 Long term (current) use of insulin: Secondary | ICD-10-CM

## 2014-09-07 DIAGNOSIS — R5383 Other fatigue: Secondary | ICD-10-CM

## 2014-09-07 DIAGNOSIS — E119 Type 2 diabetes mellitus without complications: Secondary | ICD-10-CM

## 2014-09-07 DIAGNOSIS — I10 Essential (primary) hypertension: Secondary | ICD-10-CM

## 2014-09-07 LAB — POCT CBC
Granulocyte percent: 49.4 %G (ref 37–80)
HCT, POC: 40.1 % (ref 37.7–47.9)
HEMOGLOBIN: 12.2 g/dL (ref 12.2–16.2)
Lymph, poc: 2.8 (ref 0.6–3.4)
MCH, POC: 29 pg (ref 27–31.2)
MCHC: 30.4 g/dL — AB (ref 31.8–35.4)
MCV: 95.4 fL (ref 80–97)
MPV: 9.6 fL (ref 0–99.8)
POC GRANULOCYTE: 3.1 (ref 2–6.9)
POC LYMPH PERCENT: 45.3 %L (ref 10–50)
Platelet Count, POC: 156 10*3/uL (ref 142–424)
RBC: 4.21 M/uL (ref 4.04–5.48)
RDW, POC: 12.8 %
WBC: 6.2 10*3/uL (ref 4.6–10.2)

## 2014-09-07 MED ORDER — INSULIN GLARGINE 300 UNIT/ML ~~LOC~~ SOPN: 25.0000 [IU] | PEN_INJECTOR | SUBCUTANEOUS | Status: DC

## 2014-09-07 NOTE — Patient Instructions (Signed)
Diabetes and Standards of Medical Care    Diabetes is complicated. You may find that your diabetes team includes a dietitian, nurse, diabetes educator, eye doctor, and more. To help everyone know what is going on and to help you get the care you deserve, the following schedule of care was developed to help keep you on track. Below are the tests, exams, vaccines, medicines, education, and plans you will need.  Blood Glucose Goals Prior to meals = 80 - 130 Within 2 hours of the start of a meal = less than 180  **Medication Changes** Stop Novolin N - start Toujeo in place - inject 25 units each morning (none at night) Change Novolin R to 5 units prior to each meal or if blood glucose is greater than 250.   HbA1c test (goal is less than 7.0% - your last value was 8.2%) This test shows how well you have controlled your glucose over the past 2 to 3 months. It is used to see if your diabetes management plan needs to be adjusted.   It is performed at least 2 times a year if you are meeting treatment goals.  It is performed 4 times a year if therapy has changed or if you are not meeting treatment goals.  Blood pressure test  This test is performed at every routine medical visit. The goal is less than 140/90 mmHg for most people, but 130/80 mmHg in some cases. Ask your health care provider about your goal.  Dental exam  Follow up with the dentist regularly.  Eye exam  If you are diagnosed with type 1 diabetes as a child, get an exam upon reaching the age of 63 years or older and have had diabetes for 3 to 5 years. Yearly eye exams are recommended after that initial eye exam.  If you are diagnosed with type 1 diabetes as an adult, get an exam within 5 years of diagnosis and then yearly.  If you are diagnosed with type 2 diabetes, get an exam as soon as possible after the diagnosis and then yearly.  Foot care exam  Visual foot exams are performed at every routine medical visit. The exams  check for cuts, injuries, or other problems with the feet.  A comprehensive foot exam should be done yearly. This includes visual inspection as well as assessing foot pulses and testing for loss of sensation.  Check your feet nightly for cuts, injuries, or other problems with your feet. Tell your health care provider if anything is not healing.  Kidney function test (urine microalbumin)  This test is performed once a year.  Type 1 diabetes: The first test is performed 5 years after diagnosis.  Type 2 diabetes: The first test is performed at the time of diagnosis.  A serum creatinine and estimated glomerular filtration rate (eGFR) test is done once a year to assess the level of chronic kidney disease (CKD), if present.  Lipid profile (cholesterol, HDL, LDL, triglycerides)  Performed every 5 years for most people.  The goal for LDL is less than 100 mg/dL. If you are at high risk, the goal is less than 70 mg/dL.  The goal for HDL is 40 mg/dL to 50 mg/dL for men and 50 mg/dL to 60 mg/dL for women. An HDL cholesterol of 60 mg/dL or higher gives some protection against heart disease.  The goal for triglycerides is less than 150 mg/dL.  Influenza vaccine, pneumococcal vaccine, and hepatitis B vaccine  The influenza vaccine is recommended yearly.  The pneumococcal vaccine is generally given once in a lifetime. However, there are some instances when another vaccination is recommended. Check with your health care provider.  The hepatitis B vaccine is also recommended for adults with diabetes.  Diabetes self-management education  Education is recommended at diagnosis and ongoing as needed.  Treatment plan  Your treatment plan is reviewed at every medical visit.  Document Released: 04/21/2009 Document Revised: 02/24/2013 Document Reviewed: 11/24/2012 [facility] Patient Information 2014 [place].

## 2014-09-07 NOTE — Progress Notes (Signed)
Subjective:    Susan Petty is a 69 y.o. female who presents for an evaluation of Type 2 diabetes mellitus and diabetes education. Current symptoms/problems include hyperglycemia and hypoglycemia  and have been unchanged. Patient reports hypoglycemia 1-2 times per week - occurring during sleep or early am.  The patient was initially diagnosed with Type 2 diabetes about 25 years ago.   Known diabetic complications: none Cardiovascular risk factors: advanced age (older than 26 for men, 67 for women), diabetes mellitus, dyslipidemia and hypertension Current diabetic medications include novolin N - 25 units each moning and 10 until each evening;  novolin R - 10 units prior to each meal (patient admits to not taking before evry meal due to low BG).   Eye exam current (within one year): yes Weight trend: stable Prior visit with dietician: no Current diet: not counting CHOs Current exercise: none  Current monitoring regimen: home blood tests - 2-3 times daily Home blood sugar records: 36 to 190's  Is She on ACE inhibitor or angiotensin II receptor blocker?  Yes  benazepril (Lotensin)    The following portions of the patient's history were reviewed and updated as appropriate: allergies, current medications, past family history, past medical history, past social history, past surgical history and problem list.   Objective:    BP 138/52 mmHg  Pulse 66  Ht 5\' 4"  (1.626 m)  Wt 129 lb (58.514 kg)  BMI 22.13 kg/m2   Lab Review GLUCOSE (mg/dL)  Date Value  08/29/2014 91  01/11/2014 290*  10/05/2013 245*   GLUCOSE, BLD (mg/dL)  Date Value  12/14/2012 121*  09/05/2011 182*  09/04/2011 548*   CO2 (mmol/L)  Date Value  08/29/2014 31*  01/11/2014 30*  10/05/2013 29   BUN (mg/dL)  Date Value  08/29/2014 12  01/11/2014 11  10/05/2013 13  12/14/2012 11  09/05/2011 24*  09/04/2011 27*   CREAT (mg/dL)  Date Value  12/14/2012 0.71   CREATININE, SER (mg/dL)  Date Value   08/29/2014 0.71  01/11/2014 0.77  10/05/2013 0.70   A1c = 8.2%  Assessment:    Diabetes Mellitus type II, under inadequate control - patient is c/o of eating when she isn't hungry to prevent hypoglycemia.Marland Kitchen   HTN - BP was elevated at previous visit and home BP reading have been elevated.  HR was low today and DBP was low.  Due to patient's history of hip fractures concerned about BP getting low as well.   Plan:    1.  Rx changes: discontinue novolin N - start Toujeo 25 units once daily in am   Decrease Novolin R to 5 units prior to each meal 2.  Education: Reviewed 'ABCs' of diabetes management (respective goals in parentheses):  A1C (<7), blood pressure (<130/80), and cholesterol (LDL <100). 3. Discussed CHO counting principles.  Reviewed serving sizes and recommended CHO per meal - 45 grams and 15 grams per snack. 4.  Continue to monitor BP and HR at home.  Discussed goals (less than 140/90 and HR between 60 to 90) 5. Consider checking C Peptide in future.    Follow up: 3 weeks    Cherre Robins, PharmD, CPP

## 2014-09-17 ENCOUNTER — Ambulatory Visit (INDEPENDENT_AMBULATORY_CARE_PROVIDER_SITE_OTHER): Payer: Medicare Other | Admitting: Nurse Practitioner

## 2014-09-17 VITALS — BP 138/82 | HR 54 | Temp 98.5°F | Ht 64.0 in | Wt 128.2 lb

## 2014-09-17 DIAGNOSIS — T148 Other injury of unspecified body region: Secondary | ICD-10-CM | POA: Diagnosis not present

## 2014-09-17 DIAGNOSIS — W548XXA Other contact with dog, initial encounter: Secondary | ICD-10-CM | POA: Diagnosis not present

## 2014-09-17 MED ORDER — CEPHALEXIN 500 MG PO CAPS: 500.0000 mg | ORAL_CAPSULE | Freq: Four times a day (QID) | ORAL | Status: DC

## 2014-09-17 NOTE — Progress Notes (Signed)
   Subjective:    Patient ID: Susan Petty, female    DOB: 1945/11/19, 69 y.o.   MRN: [id number]  HPI Patient here today with dog scratch- Happened Wednesday morning- SHe was playing with dog and it scratched here left hand- getting red and sore to touch.    Review of Systems  Constitutional: Negative.   HENT: Negative.   Respiratory: Negative.   Cardiovascular: Negative.   Genitourinary: Negative.   Neurological: Negative.   Psychiatric/Behavioral: Negative.   All other systems reviewed and are negative.      Objective:   Physical Exam  Constitutional: She is oriented to person, place, and time. She appears well-developed and well-nourished. No distress.  Cardiovascular: Normal rate, regular rhythm and normal heart sounds.   Pulmonary/Chest: Effort normal and breath sounds normal.  Neurological: She is alert and oriented to person, place, and time.  Skin: Skin is warm.  6cm superficial wound left hand- surrounding erythema- no drainage  Psychiatric: She has a normal mood and affect. Her behavior is normal. Judgment and thought content normal.    BP 138/82 mmHg  Pulse 54  Temp(Src) 98.5 F (36.9 C) (Oral)  Ht 5\' 4"  (1.626 m)  Wt 128 lb 3.2 oz (58.151 kg)  BMI 21.99 kg/m2        Assessment & Plan:   1. Dog scratch    Meds ordered this encounter  Medications  . cephALEXin (KEFLEX) 500 MG capsule    Sig: Take 1 capsule (500 mg total) by mouth 4 (four) times daily.    Dispense:  30 capsule    Refill:  0    Order Specific Question:  Supervising Provider    Answer:  Joycelyn Man   Clean with antibacterial soap daily Antibiotic ointment rto prn  Mary-Margaret Hassell Done, FNP

## 2014-09-17 NOTE — Patient Instructions (Signed)
Animal Bite °Animal bite wounds can get infected. It is important to get proper medical treatment. Ask your doctor if you need a rabies shot. °HOME CARE  °· Follow your doctor's instructions for taking care of your wound. °· Only take medicine as told by your doctor. °· Take your medicine (antibiotics) as told. Finish them even if you start to feel better. °· Keep all doctor visits as told. °You may need a tetanus shot if:  °· You cannot remember when you had your last tetanus shot. °· You have never had a tetanus shot. °· The injury broke your skin. °If you need a tetanus shot and you choose not to have one, you may get tetanus. Sickness from tetanus can be serious. °GET HELP RIGHT AWAY IF:  °· Your wound is warm, red, sore, or puffy (swollen). °· You notice yellowish-white fluid (pus) or a bad smell coming from the wound. °· You see a red line on the skin coming from the wound. °· You have a fever, chills, or you feel sick. °· You feel sick to your stomach (nauseous), or you throw up (vomit). °· Your pain does not go away, or it gets worse. °· You have trouble moving the injured part. °· You have questions or concerns. °MAKE SURE YOU:  °· Understand these instructions. °· Will watch your condition. °· Will get help right away if you are not doing well or get worse. °Document Released: 06/24/2005 Document Revised: 09/16/2011 Document Reviewed: 02/13/2011 °[employer]® Patient Information ©2015 [employer]. This information is not intended to replace advice given to you by your health care provider. Make sure you discuss any questions you have with your health care provider. ° ° ° °

## 2014-09-20 DIAGNOSIS — H25813 Combined forms of age-related cataract, bilateral: Secondary | ICD-10-CM | POA: Diagnosis not present

## 2014-09-26 ENCOUNTER — Telehealth: Payer: Self-pay | Admitting: Nurse Practitioner

## 2014-09-26 MED ORDER — FLUCONAZOLE 150 MG PO TABS
150.0000 mg | ORAL_TABLET | Freq: Once | ORAL | Status: DC
Start: 2014-09-26 — End: 2015-03-14

## 2014-09-26 NOTE — Telephone Encounter (Signed)
diflucan rx sent to pharmacy 

## 2014-09-26 NOTE — Telephone Encounter (Signed)
lmovm that Rx was sent to pharmacy

## 2014-09-29 ENCOUNTER — Ambulatory Visit: Payer: Medicare Other

## 2014-10-17 ENCOUNTER — Other Ambulatory Visit: Payer: Self-pay | Admitting: Family Medicine

## 2014-10-17 ENCOUNTER — Ambulatory Visit (INDEPENDENT_AMBULATORY_CARE_PROVIDER_SITE_OTHER): Payer: Medicare Other | Admitting: Family

## 2014-10-17 ENCOUNTER — Encounter: Payer: Self-pay | Admitting: Family

## 2014-10-17 VITALS — BP 143/59 | HR 60 | Temp 98.2°F | Ht 64.0 in | Wt 129.4 lb

## 2014-10-17 DIAGNOSIS — J0191 Acute recurrent sinusitis, unspecified: Secondary | ICD-10-CM

## 2014-10-17 MED ORDER — AMOXICILLIN-POT CLAVULANATE 875-125 MG PO TABS: 1.0000 | ORAL_TABLET | Freq: Two times a day (BID) | ORAL | Status: DC

## 2014-10-17 NOTE — Progress Notes (Signed)
   Subjective:    Patient ID: Susan Petty, female    DOB: [birth year], 69 y.o.   MRN: [id number]  Sinusitis This is a recurrent problem. The current episode started yesterday. The problem is unchanged. There has been no fever. Her pain is at a severity of 5/10. The pain is mild. Associated symptoms include congestion, headaches, neck pain, sinus pressure and sneezing. Pertinent negatives include no chills, coughing, ear pain, hoarse voice, shortness of breath or sore throat. Past treatments include oral decongestants (Mucinex). The treatment provided mild relief.      Review of Systems  Constitutional: Negative.  Negative for chills.  HENT: Positive for congestion, sinus pressure and sneezing. Negative for ear pain, hoarse voice and sore throat.   Eyes: Negative.   Respiratory: Negative.  Negative for cough and shortness of breath.   Cardiovascular: Negative.  Negative for palpitations.  Gastrointestinal: Negative.   Endocrine: Negative.   Genitourinary: Negative.   Musculoskeletal: Positive for neck pain.  Neurological: Positive for headaches.  Hematological: Negative.   Psychiatric/Behavioral: Negative.   All other systems reviewed and are negative.      Objective:   Physical Exam  Constitutional: She is oriented to person, place, and time. She appears well-developed and well-nourished. No distress.  HENT:  Head: Normocephalic and atraumatic.  Right Ear: External ear normal.  Left Ear: External ear normal.  Nasal passage erythemas with mild swelling  Oropharynx erythemas    Eyes: Pupils are equal, round, and reactive to light.  Neck: Normal range of motion. Neck supple. No thyromegaly present.  Cardiovascular: Normal rate, regular rhythm, normal heart sounds and intact distal pulses.   No murmur heard. Pulmonary/Chest: Effort normal and breath sounds normal. No respiratory distress. She has no wheezes.  Abdominal: Soft. Bowel sounds are normal. She exhibits no  distension. There is no tenderness.  Musculoskeletal: Normal range of motion. She exhibits no edema or tenderness.  Neurological: She is alert and oriented to person, place, and time. She has normal reflexes. No cranial nerve deficit.  Skin: Skin is warm and dry.  Psychiatric: She has a normal mood and affect. Her behavior is normal. Judgment and thought content normal.  Vitals reviewed.     BP 143/59 mmHg  Pulse 60  Temp(Src) 98.2 F (36.8 C) (Oral)  Ht 5\' 4"  (1.626 m)  Wt 129 lb 6.4 oz (58.695 kg)  BMI 22.20 kg/m2     Assessment & Plan:  1. Acute recurrent sinusitis, unspecified location -- Take meds as prescribed - Use a cool mist humidifier  -Use saline nose sprays frequently -Saline irrigations of the nose can be very helpful if done frequently.  * 4X daily for 1 week*  * Use of a nettie pot can be helpful with this. Follow directions with this* -Force fluids -For any cough or congestion  Use plain Mucinex- regular strength or max strength is fine   * Children- consult with Pharmacist for dosing -For fever or aces or pains- take tylenol or ibuprofen appropriate for age and weight.  * for fevers greater than 101 orally you may alternate ibuprofen and tylenol every  3 hours. -Throat lozenges if help - amoxicillin-clavulanate (AUGMENTIN) 875-125 MG per tablet; Take 1 tablet by mouth 2 (two) times daily.  Dispense: 14 tablet; Refill: 0  Evelina Dun, FNP

## 2014-10-17 NOTE — Telephone Encounter (Signed)
lmovm needing more information on why refill of abx is being requested

## 2014-10-17 NOTE — Patient Instructions (Signed)
Sinusitis Sinusitis is redness, soreness, and inflammation of the paranasal sinuses. Paranasal sinuses are air pockets within the bones of your face (beneath the eyes, the middle of the forehead, or above the eyes). In healthy paranasal sinuses, mucus is able to drain out, and air is able to circulate through them by way of your nose. However, when your paranasal sinuses are inflamed, mucus and air can become trapped. This can allow bacteria and other germs to grow and cause infection. Sinusitis can develop quickly and last only a short time (acute) or continue over a long period (chronic). Sinusitis that lasts for more than 12 weeks is considered chronic.  CAUSES  Causes of sinusitis include:  Allergies.  Structural abnormalities, such as displacement of the cartilage that separates your nostrils (deviated septum), which can decrease the air flow through your nose and sinuses and affect sinus drainage.  Functional abnormalities, such as when the small hairs (cilia) that line your sinuses and help remove mucus do not work properly or are not present. SIGNS AND SYMPTOMS  Symptoms of acute and chronic sinusitis are the same. The primary symptoms are pain and pressure around the affected sinuses. Other symptoms include:  Upper toothache.  Earache.  Headache.  Bad breath.  Decreased sense of smell and taste.  A cough, which worsens when you are lying flat.  Fatigue.  Fever.  Thick drainage from your nose, which often is green and may contain pus (purulent).  Swelling and warmth over the affected sinuses. DIAGNOSIS  Your health care provider will perform a physical exam. During the exam, your health care provider may:  Look in your nose for signs of abnormal growths in your nostrils (nasal polyps).  Tap over the affected sinus to check for signs of infection.  View the inside of your sinuses (endoscopy) using an imaging device that has a light attached (endoscope). If your health  care provider suspects that you have chronic sinusitis, one or more of the following tests may be recommended:  Allergy tests.  Nasal culture. A sample of mucus is taken from your nose, sent to a lab, and screened for bacteria.  Nasal cytology. A sample of mucus is taken from your nose and examined by your health care provider to determine if your sinusitis is related to an allergy. TREATMENT  Most cases of acute sinusitis are related to a viral infection and will resolve on their own within 10 days. Sometimes medicines are prescribed to help relieve symptoms (pain medicine, decongestants, nasal steroid sprays, or saline sprays).  However, for sinusitis related to a bacterial infection, your health care provider will prescribe antibiotic medicines. These are medicines that will help kill the bacteria causing the infection.  Rarely, sinusitis is caused by a fungal infection. In theses cases, your health care provider will prescribe antifungal medicine. For some cases of chronic sinusitis, surgery is needed. Generally, these are cases in which sinusitis recurs more than 3 times per year, despite other treatments. HOME CARE INSTRUCTIONS   Drink plenty of water. Water helps thin the mucus so your sinuses can drain more easily.  Use a humidifier.  Inhale steam 3 to 4 times a day (for example, sit in the bathroom with the shower running).  Apply a warm, moist washcloth to your face 3 to 4 times a day, or as directed by your health care provider.  Use saline nasal sprays to help moisten and clean your sinuses.  Take medicines only as directed by your health care provider.    If you were prescribed either an antibiotic or antifungal medicine, finish it all even if you start to feel better. SEEK IMMEDIATE MEDICAL CARE IF:  You have increasing pain or severe headaches.  You have nausea, vomiting, or drowsiness.  You have swelling around your face.  You have vision problems.  You have a stiff  neck.  You have difficulty breathing. MAKE SURE YOU:   Understand these instructions.  Will watch your condition.  Will get help right away if you are not doing well or get worse. Document Released: 06/24/2005 Document Revised: 11/08/2013 Document Reviewed: 07/09/2011 ExitCare Patient Information 2015 [employer]. This information is not intended to replace advice given to you by your health care provider. Make sure you discuss any questions you have with your health care provider.  - Take meds as prescribed - Use a cool mist humidifier  -Use saline nose sprays frequently -Saline irrigations of the nose can be very helpful if done frequently.  * 4X daily for 1 week*  * Use of a nettie pot can be helpful with this. Follow directions with this* -Force fluids -For any cough or congestion  Use plain Mucinex- regular strength or max strength is fine   * Children- consult with Pharmacist for dosing -For fever or aces or pains- take tylenol or ibuprofen appropriate for age and weight.  * for fevers greater than 101 orally you may alternate ibuprofen and tylenol every  3 hours. -Throat lozenges if help   Neeya Prigmore, FNP  

## 2014-10-18 NOTE — Telephone Encounter (Signed)
Last seen 10/17/14 [facility]

## 2014-10-24 DIAGNOSIS — H2512 Age-related nuclear cataract, left eye: Secondary | ICD-10-CM | POA: Diagnosis not present

## 2014-10-24 DIAGNOSIS — Z961 Presence of intraocular lens: Secondary | ICD-10-CM | POA: Diagnosis not present

## 2014-10-24 DIAGNOSIS — H25812 Combined forms of age-related cataract, left eye: Secondary | ICD-10-CM | POA: Diagnosis not present

## 2014-10-28 ENCOUNTER — Other Ambulatory Visit: Payer: Self-pay | Admitting: Nurse Practitioner

## 2014-10-29 ENCOUNTER — Telehealth: Payer: Self-pay | Admitting: Nurse Practitioner

## 2014-10-29 DIAGNOSIS — E109 Type 1 diabetes mellitus without complications: Secondary | ICD-10-CM

## 2014-10-29 MED ORDER — GLUCOSE BLOOD VI STRP: ORAL_STRIP | Status: DC

## 2014-10-29 NOTE — Telephone Encounter (Signed)
Test strip rx sent to pharmacy- patient aware

## 2014-10-31 ENCOUNTER — Other Ambulatory Visit: Payer: Self-pay

## 2014-10-31 DIAGNOSIS — E109 Type 1 diabetes mellitus without complications: Secondary | ICD-10-CM

## 2014-10-31 MED ORDER — GLUCOSE BLOOD VI STRP: ORAL_STRIP | Status: DC

## 2014-10-31 NOTE — Telephone Encounter (Signed)
done

## 2014-11-14 ENCOUNTER — Other Ambulatory Visit: Payer: Self-pay | Admitting: General Practice

## 2014-11-21 DIAGNOSIS — Z961 Presence of intraocular lens: Secondary | ICD-10-CM | POA: Diagnosis not present

## 2014-11-21 DIAGNOSIS — H2511 Age-related nuclear cataract, right eye: Secondary | ICD-10-CM | POA: Diagnosis not present

## 2014-11-21 DIAGNOSIS — H25811 Combined forms of age-related cataract, right eye: Secondary | ICD-10-CM | POA: Diagnosis not present

## 2014-11-30 DIAGNOSIS — Z1231 Encounter for screening mammogram for malignant neoplasm of breast: Secondary | ICD-10-CM | POA: Diagnosis not present

## 2014-12-16 DIAGNOSIS — H40033 Anatomical narrow angle, bilateral: Secondary | ICD-10-CM | POA: Diagnosis not present

## 2014-12-16 DIAGNOSIS — H04123 Dry eye syndrome of bilateral lacrimal glands: Secondary | ICD-10-CM | POA: Diagnosis not present

## 2014-12-20 ENCOUNTER — Encounter: Payer: Self-pay | Admitting: Family Medicine

## 2015-01-31 ENCOUNTER — Other Ambulatory Visit: Payer: Self-pay | Admitting: General Practice

## 2015-01-31 NOTE — Telephone Encounter (Signed)
Patient NTBS for follow up and lab work  

## 2015-02-01 NOTE — Telephone Encounter (Signed)
Detailed message left for patient that she needs to be seen for a follow up and that her rx was sent in one time

## 2015-02-06 ENCOUNTER — Other Ambulatory Visit: Payer: Self-pay | Admitting: Nurse Practitioner

## 2015-02-07 DIAGNOSIS — H6123 Impacted cerumen, bilateral: Secondary | ICD-10-CM | POA: Diagnosis not present

## 2015-02-28 DIAGNOSIS — L570 Actinic keratosis: Secondary | ICD-10-CM | POA: Diagnosis not present

## 2015-02-28 DIAGNOSIS — Z1283 Encounter for screening for malignant neoplasm of skin: Secondary | ICD-10-CM | POA: Diagnosis not present

## 2015-02-28 DIAGNOSIS — X32XXXD Exposure to sunlight, subsequent encounter: Secondary | ICD-10-CM | POA: Diagnosis not present

## 2015-03-14 ENCOUNTER — Encounter: Payer: Self-pay | Admitting: Nurse Practitioner

## 2015-03-14 ENCOUNTER — Ambulatory Visit (INDEPENDENT_AMBULATORY_CARE_PROVIDER_SITE_OTHER): Payer: Medicare Other | Admitting: Nurse Practitioner

## 2015-03-14 VITALS — BP 156/58 | HR 57 | Temp 98.1°F | Ht 64.0 in | Wt 130.0 lb

## 2015-03-14 DIAGNOSIS — Z23 Encounter for immunization: Secondary | ICD-10-CM

## 2015-03-14 DIAGNOSIS — I10 Essential (primary) hypertension: Secondary | ICD-10-CM

## 2015-03-14 DIAGNOSIS — Z794 Long term (current) use of insulin: Secondary | ICD-10-CM

## 2015-03-14 DIAGNOSIS — K219 Gastro-esophageal reflux disease without esophagitis: Secondary | ICD-10-CM | POA: Diagnosis not present

## 2015-03-14 DIAGNOSIS — E785 Hyperlipidemia, unspecified: Secondary | ICD-10-CM

## 2015-03-14 DIAGNOSIS — F411 Generalized anxiety disorder: Secondary | ICD-10-CM | POA: Diagnosis not present

## 2015-03-14 DIAGNOSIS — E119 Type 2 diabetes mellitus without complications: Secondary | ICD-10-CM

## 2015-03-14 DIAGNOSIS — B373 Candidiasis of vulva and vagina: Secondary | ICD-10-CM | POA: Diagnosis not present

## 2015-03-14 DIAGNOSIS — M8000XD Age-related osteoporosis with current pathological fracture, unspecified site, subsequent encounter for fracture with routine healing: Secondary | ICD-10-CM

## 2015-03-14 DIAGNOSIS — B3731 Acute candidiasis of vulva and vagina: Secondary | ICD-10-CM

## 2015-03-14 LAB — POCT GLYCOSYLATED HEMOGLOBIN (HGB A1C): HEMOGLOBIN A1C: 8.1

## 2015-03-14 MED ORDER — HYDROCHLOROTHIAZIDE 25 MG PO TABS: 25.0000 mg | ORAL_TABLET | Freq: Every day | ORAL | Status: DC

## 2015-03-14 MED ORDER — AMLODIPINE BESYLATE 5 MG PO TABS: 5.0000 mg | ORAL_TABLET | Freq: Every day | ORAL | Status: DC

## 2015-03-14 MED ORDER — BENAZEPRIL HCL 40 MG PO TABS: 40.0000 mg | ORAL_TABLET | Freq: Every day | ORAL | Status: DC

## 2015-03-14 MED ORDER — METOPROLOL TARTRATE 50 MG PO TABS: 50.0000 mg | ORAL_TABLET | Freq: Two times a day (BID) | ORAL | Status: DC

## 2015-03-14 MED ORDER — OMEPRAZOLE 40 MG PO CPDR: 40.0000 mg | DELAYED_RELEASE_CAPSULE | Freq: Every day | ORAL | Status: DC

## 2015-03-14 MED ORDER — ALPRAZOLAM 0.5 MG PO TABS: ORAL_TABLET | ORAL | Status: DC

## 2015-03-14 MED ORDER — FLUCONAZOLE 150 MG PO TABS: ORAL_TABLET | ORAL | Status: DC

## 2015-03-14 NOTE — Addendum Note (Signed)
Addended by: Jamelle Haring on: 03/14/2015 01:19 PM   Modules accepted: Orders

## 2015-03-14 NOTE — Progress Notes (Signed)
Subjective:    Patient ID: Susan Petty, female    DOB: 04-07-1946, 69 y.o.   MRN: [id number]  Patient here today for follow up of chronic medical problems. No complaints  Hypertension This is a chronic problem. The current episode started more than 1 year ago. The problem is unchanged. The problem is controlled. Pertinent negatives include no chest pain, palpitations or shortness of breath. Risk factors for coronary artery disease include diabetes mellitus, dyslipidemia, post-menopausal state and sedentary lifestyle. Past treatments include beta blockers, ACE inhibitors and diuretics. The current treatment provides moderate improvement. Compliance problems include diet and exercise.  Hypertensive end-organ damage includes CAD/MI.  Hyperlipidemia This is a chronic problem. The current episode started more than 1 year ago. Recent lipid tests were reviewed and are variable. Pertinent negatives include no chest pain or shortness of breath. She is currently on no antihyperlipidemic treatment. The current treatment provides mild improvement of lipids. Compliance problems include adherence to diet and adherence to exercise.  Risk factors for coronary artery disease include diabetes mellitus, dyslipidemia, hypertension, post-menopausal and a sedentary lifestyle.  Diabetes She presents for her follow-up diabetic visit. She has gestational diabetes mellitus. No MedicAlert identification noted. Her disease course has been fluctuating. There are no hypoglycemic associated symptoms. Pertinent negatives for diabetes include no chest pain and no fatigue. There are no hypoglycemic complications. Symptoms are stable. There are no diabetic complications. Risk factors for coronary artery disease include diabetes mellitus, dyslipidemia, hypertension, post-menopausal and sedentary lifestyle. Current diabetic treatment includes insulin injections. Her weight is stable. When asked about meal planning, she reported none.  She has not had a previous visit with a dietitian. She rarely participates in exercise. There is no change in her home blood glucose trend. Her breakfast blood glucose is taken between 8-9 am. Her breakfast blood glucose range is generally 180-200 mg/dl. Her lunch blood glucose is taken between 11-12 pm. Her lunch blood glucose range is generally 130-140 mg/dl. Her dinner blood glucose is taken between 4-5 pm. Her overall blood glucose range is 130-140 mg/dl. An ACE inhibitor/angiotensin II receptor blocker is being taken. She does not see a podiatrist.Eye exam is current (July 2016).  GERD Taking omeprazole OTC daily- keeps symptoms under control. No complaints at this time Osteoporosis Fosamax weekly- no side effects- no c/o of back pain. No complaints related to the medication  Review of Systems  Constitutional: Negative.  Negative for fatigue.  HENT: Negative.   Eyes: Negative.   Respiratory: Negative.  Negative for shortness of breath.   Cardiovascular: Negative for chest pain and palpitations.  Endocrine: Negative.   Genitourinary: Negative.   Musculoskeletal: Negative.   Neurological: Negative.   Hematological: Negative.   Psychiatric/Behavioral: Negative.   All other systems reviewed and are negative.      Objective:   Physical Exam  Constitutional: She is oriented to person, place, and time. She appears well-developed and well-nourished.  HENT:  Head: Normocephalic.  Right Ear: Hearing, tympanic membrane, external ear and ear canal normal.  Left Ear: Hearing, tympanic membrane, external ear and ear canal normal.  Nose: Nose normal.  Mouth/Throat: Uvula is midline and oropharynx is clear and moist.  Eyes: Conjunctivae and EOM are normal. Pupils are equal, round, and reactive to light.  Neck: Normal range of motion and full passive range of motion without pain. Neck supple. No JVD present. Carotid bruit is not present. No thyroid mass and no thyromegaly present.    Cardiovascular: Normal rate, regular rhythm, normal heart  sounds and intact distal pulses.   No murmur heard. Pulmonary/Chest: Effort normal and breath sounds normal. Right breast exhibits no inverted nipple, no mass, no nipple discharge, no skin change and no tenderness. Left breast exhibits no inverted nipple, no mass, no nipple discharge, no skin change and no tenderness.  Abdominal: Soft. Bowel sounds are normal. She exhibits no mass. There is no tenderness.  Genitourinary: No breast swelling, tenderness, discharge or bleeding.  Musculoskeletal: Normal range of motion.  Lymphadenopathy:    She has no cervical adenopathy.  Neurological: She is alert and oriented to person, place, and time.  Skin: Skin is warm and dry.  Psychiatric: She has a normal mood and affect. Her behavior is normal. Judgment and thought content normal.   BP 156/58 mmHg  Pulse 57  Temp(Src) 98.1 F (36.7 C) (Oral)  Ht _0  (1.626 m)  Wt 130 lb (58.968 kg)  BMI 22.30 kg/m2  Results for orders placed or performed in visit on 03/14/15  POCT glycosylated hemoglobin (Hb A1C)  Result Value Ref Range   Hemoglobin A1C 8.1          Assessment & Plan:  1. Insulin use (long-term) in type 2 diabetes - POCT glycosylated hemoglobin (Hb A1C)  2. Hyperlipidemia Low fat diet - Lipid panel  3. Essential hypertension Do not add salt to diet Added norvasc to meds - CMP14+EGFR - benazepril (LOTENSIN) 40 MG tablet; Take 1 tablet (40 mg total) by mouth daily.  Dispense: 30 tablet; Refill: 5 - hydrochlorothiazide (HYDRODIURIL) 25 MG tablet; Take 1 tablet (25 mg total) by mouth daily.  Dispense: 30 tablet; Refill: 5 - metoprolol (LOPRESSOR) 50 MG tablet; Take 1 tablet (50 mg total) by mouth 2 (two) times daily.  Dispense: 60 tablet; Refill: 5 - amLODipine (NORVASC) 5 MG tablet; Take 1 tablet (5 mg total) by mouth daily.  Dispense: 90 tablet; Refill: 1  4. Gastroesophageal reflux disease without esophagitis Avoid  spicy foods Do not eat 2 hours prior to bedtime  - omeprazole (PRILOSEC) 40 MG capsule; Take 1 capsule (40 mg total) by mouth daily.  Dispense: 30 capsule; Refill: 5  5. Type 2 diabetes mellitus with insulin therapy Patient wants to continue current dose of insulin because she is having frequent lows- patient told that if hgba1c is still high at next visit we will need to make some changes- would prefer for her to be on lantus.  6. Osteoporosis with pathological fracture, with routine healing, subsequent encounter Weight bearing exercises  7. GAD (generalized anxiety disorder) Stress management - ALPRAZolam (XANAX) 0.5 MG tablet; TAKE ONE TABLET BY MOUTH ONCE DAILY AT BEDTIME AS NEEDED FOR  SLEEP  Dispense: 30 tablet; Refill: 0  8. Vaginal candidiasis - fluconazole (DIFLUCAN) 150 MG tablet; 1 po q week x 4 weeks  Dispense: 4 tablet; Refill: 0   Health maintance reviewed Continue current meds Recheck in 3 months  [place], FNP

## 2015-03-14 NOTE — Patient Instructions (Signed)
Insulin Glargine injection °What is this medicine? °INSULIN GLARGINE (IN su lin GLAR geen) is a human-made form of insulin. This drug lowers the amount of sugar in your blood. It is a long-acting insulin that is usually given once a day. °This medicine may be used for other purposes; ask your health care provider or pharmacist if you have questions. °COMMON BRAND NAME(S): Lantus, Lantus SoloStar, Toujeo SoloStar °What should I tell my health care provider before I take this medicine? °They need to know if you have any of these conditions: °-episodes of hypoglycemia °-kidney disease °-liver disease °-an unusual or allergic reaction to insulin, metacresol, other medicines, foods, dyes, or preservatives °-pregnant or trying to get pregnant °-breast-feeding °How should I use this medicine? °This medicine is for injection under the skin. Use this medicine at the same time each day. Use exactly as directed. This insulin should never be mixed in the same syringe with other insulins before injection. Do not vigorously shake before use. You will be taught how to use this medicine and how to adjust doses for activities and illness. Do not use more insulin than prescribed. °Always check the appearance of your insulin before using it. This medicine should be clear and colorless like water. Do not use it if it is cloudy, thickened, colored, or has solid particles in it. °It is important that you put your used needles and syringes in a special sharps container. Do not put them in a trash can. If you do not have a sharps container, call your pharmacist or healthcare provider to get one. °Talk to your pediatrician regarding the use of this medicine in children. Special care may be needed. °Overdosage: If you think you have taken too much of this medicine contact a poison control center or emergency room at once. °NOTE: This medicine is only for you. Do not share this medicine with others. °What if I miss a dose? °It is important  not to miss a dose. Your health care professional or doctor should discuss a plan for missed doses with you. If you do miss a dose, follow their plan. Do not take double doses. °What may interact with this medicine? °-other medicines for diabetes °Many medications may cause an increase or decrease in blood sugar, these include: °-alcohol containing beverages °-aspirin and aspirin-like drugs °-chloramphenicol °-chromium °-diuretics °-female hormones, like estrogens or progestins and birth control pills °-heart medicines °-isoniazid °-female hormones or anabolic steroids °-medicines for weight loss °-medicines for allergies, asthma, cold, or cough °-medicines for mental problems °-medicines called MAO Inhibitors like Nardil, Parnate, Marplan, Eldepryl °-niacin °-NSAIDs, medicines for pain and inflammation, like ibuprofen or naproxen °-pentamidine °-phenytoin °-probenecid °-quinolone antibiotics like ciprofloxacin, levofloxacin, ofloxacin °-some herbal dietary supplements °-steroid medicines like prednisone or cortisone °-thyroid medicine °Some medications can hide the warning symptoms of low blood sugar. You may need to monitor your blood sugar more closely if you are taking one of these medications. These include: °-beta-blockers such as atenolol, metoprolol, propranolol °-clonidine °-guanethidine °-reserpine °This list may not describe all possible interactions. Give your health care provider a list of all the medicines, herbs, non-prescription drugs, or dietary supplements you use. Also tell them if you smoke, drink alcohol, or use illegal drugs. Some items may interact with your medicine. °What should I watch for while using this medicine? °Visit your health care professional or doctor for regular checks on your progress. °A test called the HbA1C (A1C) will be monitored. This is a simple blood test. It measures your   blood sugar control over the last 2 to 3 months. You will receive this test every 3 to 6  months. °Learn how to check your blood sugar. Learn the symptoms of low and high blood sugar and how to manage them. °Always carry a quick-source of sugar with you in case you have symptoms of low blood sugar. Examples include hard sugar candy or glucose tablets. Make sure others know that you can choke if you eat or drink when you develop serious symptoms of low blood sugar, such as seizures or unconsciousness. They must get medical help at once. °Tell your doctor or health care professional if you have high blood sugar. You might need to change the dose of your medicine. If you are sick or exercising more than usual, you might need to change the dose of your medicine. °Do not skip meals. Ask your doctor or health care professional if you should avoid alcohol. Many nonprescription cough and cold products contain sugar or alcohol. These can affect blood sugar. °Make sure that you have the right kind of syringe for the type of insulin you use. Try not to change the brand and type of insulin or syringe unless your health care professional or doctor tells you to. Switching insulin brand or type can cause dangerously high or low blood sugar. Always keep an extra supply of insulin, syringes, and needles on hand. Use a syringe one time only. Throw away syringe and needle in a closed container to prevent accidental needle sticks. °Insulin pens and cartridges should never be shared. Even if the needle is changed, sharing may result in passing of viruses like hepatitis or HIV. °Wear a medical ID bracelet or chain, and carry a card that describes your disease and details of your medicine and dosage times. °What side effects may I notice from receiving this medicine? °Side effects that you should report to your health care professional or doctor as soon as possible: °-allergic reactions like skin rash, itching or hives, swelling of the face, lips, or tongue °-breathing problems °-signs and symptoms of high blood sugar such as  dizziness, dry mouth, dry skin, fruity breath, nausea, stomach pain, increased hunger or thirst, increased urination °-signs and symptoms of low blood sugar such as feeling anxious, confusion, dizziness, increased hunger, unusually weak or tired, sweating, shakiness, cold, irritable, headache, blurred vision, fast heartbeat, loss of consciousness °Side effects that usually do not require medical attention (report to your health care professional or doctor if they continue or are bothersome): °-increase or decrease in fatty tissue under the skin due to overuse of a particular injection site °-itching, burning, swelling, or rash at site where injected °This list may not describe all possible side effects. Call your doctor for medical advice about side effects. You may report side effects to FDA at 1-800-FDA-1088. °Where should I keep my medicine? °Keep out of the reach of children. °Store unopened vials in a refrigerator between 2 and 8 degrees C (36 and 46 degrees F). Do not freeze or use if the insulin has been frozen. Opened vials (vials currently in use) may be stored in the refrigerator or at room temperature, at approximately 25 degrees C (77 degrees F) or cooler. Keeping your insulin at room temperature decreases the amount of pain during injection. Once opened, your insulin can be used for 28 days. After 28 days, the vial should be thrown away. °Store unopened pen-injector cartridges in a refrigerator between 2 and 8 degrees C (36 and 46 degrees F.)   Do not freeze or use if the insulin has been frozen. Insulin cartridges inserted into the [employer] system should be kept at room temperature, approximately 25 degrees C (77 degrees F) or cooler. Do not store in the refrigerator. Once inserted into the [employer] system, the insulin can be used for 28 days. After 28 days, the cartridge should be thrown away. °Protect from light and excessive heat. Throw away any unused medicine after the expiration date or after the  specified time for room temperature storage has passed. °NOTE: This sheet is a summary. It may not cover all possible information. If you have questions about this medicine, talk to your doctor, pharmacist, or health care provider. °© 2015, [employer]. (2013-09-02 10:13:56) ° °

## 2015-03-15 LAB — CMP14+EGFR
ALK PHOS: 73 IU/L (ref 39–117)
ALT: 15 IU/L (ref 0–32)
AST: 18 IU/L (ref 0–40)
Albumin/Globulin Ratio: 1.8 (ref 1.1–2.5)
Albumin: 4.3 g/dL (ref 3.5–4.8)
BILIRUBIN TOTAL: 0.4 mg/dL (ref 0.0–1.2)
BUN/Creatinine Ratio: 19 (ref 11–26)
BUN: 16 mg/dL (ref 8–27)
CHLORIDE: 97 mmol/L (ref 97–108)
CO2: 29 mmol/L (ref 18–29)
CREATININE: 0.85 mg/dL (ref 0.57–1.00)
Calcium: 9.6 mg/dL (ref 8.7–10.3)
GFR calc Af Amer: 80 mL/min/{1.73_m2} (ref >59)
GFR calc non Af Amer: 69 mL/min/{1.73_m2} (ref >59)
GLUCOSE: 358 mg/dL — AB (ref 65–99)
Globulin, Total: 2.4 g/dL (ref 1.5–4.5)
Potassium: 5.2 mmol/L (ref 3.5–5.2)
Sodium: 140 mmol/L (ref 134–144)
Total Protein: 6.7 g/dL (ref 6.0–8.5)

## 2015-03-15 LAB — LIPID PANEL
CHOLESTEROL TOTAL: 200 mg/dL — AB (ref 100–199)
Chol/HDL Ratio: 2.8 ratio units (ref 0.0–4.4)
HDL: 71 mg/dL (ref >39)
LDL CALC: 108 mg/dL — AB (ref 0–99)
TRIGLYCERIDES: 104 mg/dL (ref 0–149)
VLDL CHOLESTEROL CAL: 21 mg/dL (ref 5–40)

## 2015-04-26 DIAGNOSIS — H578 Other specified disorders of eye and adnexa: Secondary | ICD-10-CM | POA: Diagnosis not present

## 2015-04-27 ENCOUNTER — Other Ambulatory Visit: Payer: Self-pay | Admitting: Nurse Practitioner

## 2015-05-08 ENCOUNTER — Telehealth: Payer: Self-pay | Admitting: Nurse Practitioner

## 2015-05-08 ENCOUNTER — Other Ambulatory Visit: Payer: Self-pay | Admitting: Pharmacist

## 2015-05-08 MED ORDER — INSULIN NPH (HUMAN) (ISOPHANE) 100 UNIT/ML ~~LOC~~ SUSP: SUBCUTANEOUS | Status: DC

## 2015-05-19 ENCOUNTER — Telehealth: Payer: Self-pay | Admitting: Nurse Practitioner

## 2015-05-19 NOTE — Telephone Encounter (Signed)
Called pt, she found pills

## 2015-06-13 ENCOUNTER — Encounter: Payer: Self-pay | Admitting: Nurse Practitioner

## 2015-06-13 ENCOUNTER — Telehealth: Payer: Self-pay | Admitting: Nurse Practitioner

## 2015-06-13 ENCOUNTER — Ambulatory Visit (INDEPENDENT_AMBULATORY_CARE_PROVIDER_SITE_OTHER): Payer: Medicare Other | Admitting: Nurse Practitioner

## 2015-06-13 VITALS — BP 150/63 | HR 52 | Temp 97.8°F | Ht 65.0 in | Wt 136.2 lb

## 2015-06-13 DIAGNOSIS — J0101 Acute recurrent maxillary sinusitis: Secondary | ICD-10-CM | POA: Diagnosis not present

## 2015-06-13 MED ORDER — AZITHROMYCIN 250 MG PO TABS
ORAL_TABLET | ORAL | Status: DC
Start: 2015-06-13 — End: 2015-07-20

## 2015-06-13 NOTE — Progress Notes (Signed)
  Subjective:     Susan Petty is a 69 y.o. female who presents for evaluation of sinus pain. Symptoms include: congestion, facial pain, headaches, nasal congestion and sinus pressure. Onset of symptoms was 2 weeks ago. Symptoms have been gradually worsening since that time. Past history is significant for no history of pneumonia or bronchitis. Patient is a non-smoker.  The following portions of the patient's history were reviewed and updated as appropriate: allergies, current medications, past family history, past medical history, past social history, past surgical history and problem list.  Review of Systems Pertinent items are noted in HPI.   Objective:    BP 150/63 mmHg  Pulse 52  Temp(Src) 97.8 F (36.6 C) (Oral)  Ht 5\' 5"  (1.651 m)  Wt 136 lb 3.2 oz (61.78 kg)  BMI 22.66 kg/m2 General appearance: alert and cooperative Eyes: conjunctivae/corneas clear. PERRL, EOM's intact. Fundi benign. Ears: normal TM's and external ear canals both ears Nose: Nares normal. Septum midline. Mucosa normal. No drainage or sinus tenderness., clear discharge, moderate congestion, turbinates red, sinus tenderness bilateral Throat: lips, mucosa, and tongue normal; teeth and gums normal Lungs: clear to auscultation bilaterally Heart: regular rate and rhythm, S1, S2 normal, no murmur, click, rub or gallop    Assessment:    Acute bacterial sinusitis.    Plan:  1. Take meds as prescribed 2. Use a cool mist humidifier especially during the winter months and when heat has been humid. 3. Use saline nose sprays frequently 4. Saline irrigations of the nose can be very helpful if done frequently.  * 4X daily for 1 week*  * Use of a nettie pot can be helpful with this. Follow directions with this* 5. Drink plenty of fluids 6. Keep thermostat turn down low 7.For any cough or congestion  Use plain Mucinex- regular strength or max strength is fine   * Children- consult with Pharmacist for dosing 8. For  fever or aces or pains- take tylenol or ibuprofen appropriate for age and weight.  * for fevers greater than 101 orally you may alternate ibuprofen and tylenol every  3 hours.   Meds ordered this encounter  Medications  . azithromycin (ZITHROMAX) 250 MG tablet    Sig: Two tablets day one, then one tablet daily next 4 days.    Dispense:  6 tablet    Refill:  0    Order Specific Question:  Supervising Provider    Answer:  Chipper Herb [id number]   Mary-Margaret Hassell Done, FNP

## 2015-06-13 NOTE — Telephone Encounter (Signed)
Pt has appt

## 2015-06-13 NOTE — Telephone Encounter (Signed)
Will need to be seen

## 2015-06-13 NOTE — Patient Instructions (Signed)

## 2015-06-17 ENCOUNTER — Other Ambulatory Visit: Payer: Self-pay | Admitting: Nurse Practitioner

## 2015-06-19 ENCOUNTER — Other Ambulatory Visit: Payer: Self-pay | Admitting: Nurse Practitioner

## 2015-06-19 NOTE — Telephone Encounter (Signed)
Called to the voicemail at [facility]

## 2015-06-19 NOTE — Telephone Encounter (Signed)
Sorry but we do not do refills on antibiotics- what does she need it for

## 2015-06-19 NOTE — Telephone Encounter (Signed)
Last seen and filled 03/15/15. Call in at [facility]

## 2015-06-19 NOTE — Telephone Encounter (Signed)
Please call in xanax with 0 refills 

## 2015-06-20 NOTE — Telephone Encounter (Signed)
Patient aware that she will need to be seen for a refill on a antibiotic

## 2015-06-29 ENCOUNTER — Encounter: Payer: Self-pay | Admitting: Nurse Practitioner

## 2015-06-29 ENCOUNTER — Ambulatory Visit (INDEPENDENT_AMBULATORY_CARE_PROVIDER_SITE_OTHER): Payer: Medicare Other | Admitting: Nurse Practitioner

## 2015-06-29 VITALS — BP 184/67 | HR 50 | Temp 97.2°F | Ht 65.0 in | Wt 137.0 lb

## 2015-06-29 DIAGNOSIS — E785 Hyperlipidemia, unspecified: Secondary | ICD-10-CM

## 2015-06-29 DIAGNOSIS — Z794 Long term (current) use of insulin: Secondary | ICD-10-CM | POA: Diagnosis not present

## 2015-06-29 DIAGNOSIS — E119 Type 2 diabetes mellitus without complications: Secondary | ICD-10-CM | POA: Diagnosis not present

## 2015-06-29 DIAGNOSIS — F411 Generalized anxiety disorder: Secondary | ICD-10-CM

## 2015-06-29 DIAGNOSIS — I10 Essential (primary) hypertension: Secondary | ICD-10-CM

## 2015-06-29 DIAGNOSIS — Z1159 Encounter for screening for other viral diseases: Secondary | ICD-10-CM | POA: Diagnosis not present

## 2015-06-29 LAB — POCT GLYCOSYLATED HEMOGLOBIN (HGB A1C): HEMOGLOBIN A1C: 7.1

## 2015-06-29 MED ORDER — ALPRAZOLAM 0.5 MG PO TABS: 0.5000 mg | ORAL_TABLET | Freq: Every evening | ORAL | Status: DC | PRN

## 2015-06-29 MED ORDER — INSULIN NPH (HUMAN) (ISOPHANE) 100 UNIT/ML ~~LOC~~ SUSP: SUBCUTANEOUS | Status: DC

## 2015-06-29 MED ORDER — INSULIN REGULAR HUMAN 100 UNIT/ML IJ SOLN: 5.0000 [IU] | Freq: Three times a day (TID) | INTRAMUSCULAR | Status: DC

## 2015-06-29 NOTE — Progress Notes (Signed)
Subjective:    Patient ID: Susan Petty, female    DOB: 09-Jun-1946, 69 y.o.   MRN: [id number]  Patient here today for follow up of chronic medical problems. No complaints  Hypertension This is a chronic problem. The current episode started more than 1 year ago. The problem is unchanged. The problem is controlled (Patient says that blood pressure at home is 710 systolic consistently.). Pertinent negatives include no chest pain, palpitations or shortness of breath. Risk factors for coronary artery disease include diabetes mellitus, dyslipidemia, post-menopausal state and sedentary lifestyle. Past treatments include beta blockers, ACE inhibitors and diuretics. The current treatment provides moderate improvement. Compliance problems include diet and exercise.  Hypertensive end-organ damage includes CAD/MI.  Hyperlipidemia This is a chronic problem. The current episode started more than 1 year ago. Recent lipid tests were reviewed and are variable. Pertinent negatives include no chest pain or shortness of breath. She is currently on no antihyperlipidemic treatment. The current treatment provides mild improvement of lipids. Compliance problems include adherence to diet and adherence to exercise.  Risk factors for coronary artery disease include diabetes mellitus, dyslipidemia, hypertension, post-menopausal and a sedentary lifestyle.  Diabetes She presents for her follow-up diabetic visit. She has gestational diabetes mellitus. No MedicAlert identification noted. Her disease course has been fluctuating. There are no hypoglycemic associated symptoms. Pertinent negatives for diabetes include no chest pain and no fatigue. There are no hypoglycemic complications. Symptoms are stable. There are no diabetic complications. Risk factors for coronary artery disease include diabetes mellitus, dyslipidemia, hypertension, post-menopausal and sedentary lifestyle. Current diabetic treatment includes insulin injections.  Her weight is stable. When asked about meal planning, she reported none. She has not had a previous visit with a dietitian. She rarely participates in exercise. There is no change in her home blood glucose trend. Her breakfast blood glucose is taken between 8-9 am. Her breakfast blood glucose range is generally 180-200 mg/dl. Her lunch blood glucose is taken between 11-12 pm. Her lunch blood glucose range is generally 130-140 mg/dl. Her dinner blood glucose is taken between 4-5 pm. Her overall blood glucose range is 130-140 mg/dl. An ACE inhibitor/angiotensin II receptor blocker is being taken. She does not see a podiatrist.Eye exam is current (July 2016).  GERD Taking omeprazole OTC daily- keeps symptoms under control. No complaints at this time Osteoporosis Fosamax weekly- no side effects- no c/o of back pain. No complaints related to the medication  Review of Systems  Constitutional: Negative.  Negative for fatigue.  HENT: Negative.   Eyes: Negative.   Respiratory: Negative.  Negative for shortness of breath.   Cardiovascular: Negative for chest pain and palpitations.  Endocrine: Negative.   Genitourinary: Negative.   Musculoskeletal: Negative.   Neurological: Negative.   Hematological: Negative.   Psychiatric/Behavioral: Negative.   All other systems reviewed and are negative.      Objective:   Physical Exam  Constitutional: She is oriented to person, place, and time. She appears well-developed and well-nourished.  HENT:  Head: Normocephalic.  Right Ear: Hearing, tympanic membrane, external ear and ear canal normal.  Left Ear: Hearing, tympanic membrane, external ear and ear canal normal.  Nose: Nose normal.  Mouth/Throat: Uvula is midline and oropharynx is clear and moist.  Eyes: Conjunctivae and EOM are normal. Pupils are equal, round, and reactive to light.  Neck: Normal range of motion and full passive range of motion without pain. Neck supple. No JVD present. Carotid bruit is  not present. No thyroid mass and no  thyromegaly present.  Cardiovascular: Normal rate, regular rhythm, normal heart sounds and intact distal pulses.   No murmur heard. Pulmonary/Chest: Effort normal and breath sounds normal. Right breast exhibits no inverted nipple, no mass, no nipple discharge, no skin change and no tenderness. Left breast exhibits no inverted nipple, no mass, no nipple discharge, no skin change and no tenderness.  Abdominal: Soft. Bowel sounds are normal. She exhibits no mass. There is no tenderness.  Genitourinary: No breast swelling, tenderness, discharge or bleeding.  Musculoskeletal: Normal range of motion.  Lymphadenopathy:    She has no cervical adenopathy.  Neurological: She is alert and oriented to person, place, and time.  Skin: Skin is warm and dry.  Psychiatric: She has a normal mood and affect. Her behavior is normal. Judgment and thought content normal.   BP 184/67 mmHg  Pulse 50  Temp(Src) 97.2 F (36.2 C) (Oral)  Ht '5\' 5"'  (1.651 m)  Wt 137 lb (62.143 kg)  BMI 22.80 kg/m2  Results for orders placed or performed in visit on 06/29/15  POCT glycosylated hemoglobin (Hb A1C)  Result Value Ref Range   Hemoglobin A1C 7.1       Assessment & Plan:  1. Hyperlipidemia Low fat diet - Lipid panel  2. Essential hypertension Do not add salt to diet - CMP14+EGFR  3. Type 2 diabetes mellitus with insulin therapy ([place]) Continue  To watch carbs in diet May change to lantus later in year - POCT glycosylated hemoglobin (Hb A1C) - Microalbumin / creatinine urine ratio - insulin regular (NOVOLIN R,HUMULIN R) 100 units/mL injection; Inject 0.05 mLs (5 Units total) into the skin 3 (three) times daily before meals.  Dispense: 10 mL; Refill: 5 - insulin NPH Human (HUMULIN N) 100 UNIT/ML injection; Inject 25 units each morning and 10 unit each evening  Dispense: 30 mL; Refill: 1  4. GAD (generalized anxiety disorder) Stress management - ALPRAZolam (XANAX) 0.5 MG  tablet; Take 1 tablet (0.5 mg total) by mouth at bedtime as needed. for sleep  Dispense: 30 tablet; Refill: 2  5. Need for hepatitis C screening test - HCV Antibody RFX to Quant PCR    Labs pending Health maintenance reviewed Diet and exercise encouraged Continue all meds Follow up  In 3 months   [place], FNP

## 2015-07-01 LAB — CMP14+EGFR
A/G RATIO: 2 (ref 1.1–2.5)
ALBUMIN: 4.3 g/dL (ref 3.5–4.8)
ALT: 18 IU/L (ref 0–32)
AST: 20 IU/L (ref 0–40)
Alkaline Phosphatase: 81 IU/L (ref 39–117)
BUN / CREAT RATIO: 23 (ref 11–26)
BUN: 18 mg/dL (ref 8–27)
Bilirubin Total: 0.4 mg/dL (ref 0.0–1.2)
CALCIUM: 9.6 mg/dL (ref 8.7–10.3)
CO2: 32 mmol/L — ABNORMAL HIGH (ref 18–29)
Chloride: 100 mmol/L (ref 96–106)
Creatinine, Ser: 0.78 mg/dL (ref 0.57–1.00)
GFR, EST AFRICAN AMERICAN: 88 mL/min/{1.73_m2} (ref >59)
GFR, EST NON AFRICAN AMERICAN: 77 mL/min/{1.73_m2} (ref >59)
Globulin, Total: 2.2 g/dL (ref 1.5–4.5)
Glucose: 107 mg/dL — ABNORMAL HIGH (ref 65–99)
POTASSIUM: 4.4 mmol/L (ref 3.5–5.2)
Sodium: 143 mmol/L (ref 134–144)
TOTAL PROTEIN: 6.5 g/dL (ref 6.0–8.5)

## 2015-07-01 LAB — MICROALBUMIN / CREATININE URINE RATIO: CREATININE, UR: 23.6 mg/dL

## 2015-07-01 LAB — LIPID PANEL
CHOL/HDL RATIO: 2.4 ratio (ref 0.0–4.4)
Cholesterol, Total: 204 mg/dL — ABNORMAL HIGH (ref 100–199)
HDL: 85 mg/dL (ref >39)
LDL Calculated: 106 mg/dL — ABNORMAL HIGH (ref 0–99)
Triglycerides: 64 mg/dL (ref 0–149)
VLDL CHOLESTEROL CAL: 13 mg/dL (ref 5–40)

## 2015-07-01 LAB — HCV AB W REFLEX TO QUANT PCR: HCV Ab: <0.1 s/co ratio (ref 0.0–0.9)

## 2015-07-17 ENCOUNTER — Other Ambulatory Visit: Payer: Self-pay | Admitting: Nurse Practitioner

## 2015-07-20 ENCOUNTER — Encounter: Payer: Self-pay | Admitting: Pediatrics

## 2015-07-20 ENCOUNTER — Ambulatory Visit (INDEPENDENT_AMBULATORY_CARE_PROVIDER_SITE_OTHER): Payer: Medicare Other | Admitting: Pediatrics

## 2015-07-20 VITALS — BP 146/59 | HR 57 | Temp 97.7°F | Ht 65.0 in | Wt 141.0 lb

## 2015-07-20 DIAGNOSIS — J019 Acute sinusitis, unspecified: Secondary | ICD-10-CM

## 2015-07-20 DIAGNOSIS — Z6823 Body mass index (BMI) 23.0-23.9, adult: Secondary | ICD-10-CM | POA: Insufficient documentation

## 2015-07-20 MED ORDER — AMOXICILLIN-POT CLAVULANATE 875-125 MG PO TABS: 1.0000 | ORAL_TABLET | Freq: Two times a day (BID) | ORAL | Status: DC

## 2015-07-20 NOTE — Progress Notes (Signed)
Subjective:    Patient ID: Susan Petty, female    DOB: [birth year], 70 y.o.   MRN: [id number]  CC: Nausea; Nasal Congestion; and Headache   HPI: Susan Petty is a 70 y.o. female presenting for Nausea; Nasal Congestion; and Headache  Here with headaches, congestion for past three weeks Low grade fever, feeling bad. Had a flu shot Taking mucinex Appetite has been ok No SOB or CP No swelling in LE   DM2: diagnosed at 70yo, started on PO meds first then switched to insulin BGLs  Takes 20u NPH in the morning and 8u at night. Takes regular insulin when she needs.   Sitting with elderly people now  Depression screen [facility] 2/9 07/20/2015 03/14/2015 08/29/2014 01/11/2014 01/11/2014  Decreased Interest 0 0 0 0 0  Down, Depressed, Hopeless 0 2 2 1  0  PHQ - 2 Score 0 2 2 1  0  Altered sleeping - 0 0 - -  Tired, decreased energy - 1 1 - -  Change in appetite - 1 0 - -  Feeling bad or failure about yourself  - 0 0 - -  Trouble concentrating - 0 0 - -  Moving slowly or fidgety/restless - 0 0 - -  Suicidal thoughts - 0 0 - -  PHQ-9 Score - 4 3 - -     Relevant past medical, surgical, family and social history reviewed and updated as indicated. Interim medical history since our last visit reviewed. Allergies and medications reviewed and updated.    ROS: Per HPI unless specifically indicated above  History  Smoking status  . Never Smoker   Smokeless tobacco  . Never Used    Past Medical History Patient Active Problem List   Diagnosis Date Noted  . Body mass index (BMI) of 23.0-23.9 in adult 07/20/2015  . GAD (generalized anxiety disorder) 03/14/2015  . Osteoporosis with pathological fracture 04/13/2014  . GERD (gastroesophageal reflux disease) 12/14/2012  . Type 2 diabetes mellitus with insulin therapy ([place]) 12/14/2012  . Hyperlipidemia 12/14/2012  . Hypertension 12/14/2012  . Acute blood loss anemia 09/05/2011  . Hypokalemia 09/05/2011  . S/P left THA, s/p hip fx  09/03/2011    Current Outpatient Prescriptions  Medication Sig Dispense Refill  . alendronate (FOSAMAX) 70 MG tablet TAKE ONE TABLET BY MOUTH ONCE A WEEK TAKE  WITH  A  FULL  GLASS  OF  WATER  ON  AN  EMPTY  STOMACH 4 tablet 2  . ALPRAZolam (XANAX) 0.5 MG tablet Take 1 tablet (0.5 mg total) by mouth at bedtime as needed. for sleep 30 tablet 2  . amLODipine (NORVASC) 5 MG tablet TAKE ONE TABLET BY MOUTH ONCE DAILY 90 tablet 1  . benazepril (LOTENSIN) 40 MG tablet Take 1 tablet (40 mg total) by mouth daily. 30 tablet 5  . Cholecalciferol (VITAMIN D-3 PO) Take 2,000 Units by mouth daily.    Marland Kitchen glucose blood test strip Use as instructed 100 each 2  . hydrochlorothiazide (HYDRODIURIL) 25 MG tablet Take 1 tablet (25 mg total) by mouth daily. 30 tablet 5  . insulin NPH Human (HUMULIN N) 100 UNIT/ML injection Inject 25 units each morning and 10 unit each evening 30 mL 1  . insulin regular (NOVOLIN R,HUMULIN R) 100 units/mL injection Inject 0.05 mLs (5 Units total) into the skin 3 (three) times daily before meals. 10 mL 5  . metoprolol (LOPRESSOR) 50 MG tablet Take 1 tablet (50 mg total) by mouth 2 (two) times daily.  60 tablet 5  . omeprazole (PRILOSEC) 40 MG capsule Take 1 capsule (40 mg total) by mouth daily. 30 capsule 5  . amoxicillin-clavulanate (AUGMENTIN) 875-125 MG tablet Take 1 tablet by mouth 2 (two) times daily. 20 tablet 0   No current facility-administered medications for this visit.       Objective:    BP 146/59 mmHg  Pulse 57  Temp(Src) 97.7 F (36.5 C) (Oral)  Ht 5\' 5"  (1.651 m)  Wt 141 lb (63.957 kg)  BMI 23.46 kg/m2  Wt Readings from Last 3 Encounters:  07/20/15 141 lb (63.957 kg)  06/29/15 137 lb (62.143 kg)  06/13/15 136 lb 3.2 oz (61.78 kg)     Gen: NAD, alert, cooperative with exam, NCAT EYES: EOMI, no scleral injection or icterus ENT:  L TM slightly erythematous, no fluid behind membrane, R TM slightly dull, no fluid or erythema, OP without erythema, mild  tenderness over max sinuses b/l LYMPH: no cervical LAD CV: NRRR, normal S1/S2, no murmur, distal pulses 2+ b/l Resp: CTABL, no wheezes, normal WOB Abd: +BS, soft, NTND. no guarding or organomegaly Ext: No edema, warm Neuro: Alert and oriented, strength equal b/l UE and LE, coordination grossly normal MSK: normal muscle bulk     Assessment & Plan:    Susan Petty was seen today for nausea, nasal congestion and headache, ongoing for 3 weeks, not feeling any better, continues to have subjective fevers at home. Will treat with augmentin for acute sinusitis. Discussed symptomatic care, sinus rinses, return precautions.   Diagnoses and all orders for this visit:  Acute sinusitis, recurrence not specified, unspecified location -     amoxicillin-clavulanate (AUGMENTIN) 875-125 MG tablet; Take 1 tablet by mouth 2 (two) times daily.  Body mass index (BMI) of 23.0-23.9 in adult Pt concerned about her weight gain, has not been walking any and usually does. Up apprx 20 lbs over two years, on insulin for DM2, no swelling in LE. CTM.   Follow up plan: Return if symptoms worsen or fail to improve.  Assunta Found, MD [facility] 07/20/2015, 2:41 PM

## 2015-07-20 NOTE — Patient Instructions (Signed)
Use sinus rinses or salt water spray to help help with sinus drainage

## 2015-07-21 ENCOUNTER — Telehealth: Payer: Self-pay | Admitting: Nurse Practitioner

## 2015-07-27 ENCOUNTER — Ambulatory Visit: Payer: Medicare Other | Admitting: Pediatrics

## 2015-08-09 ENCOUNTER — Encounter: Payer: Self-pay | Admitting: Pediatrics

## 2015-08-09 ENCOUNTER — Ambulatory Visit (INDEPENDENT_AMBULATORY_CARE_PROVIDER_SITE_OTHER): Payer: Medicare Other | Admitting: Pediatrics

## 2015-08-09 VITALS — BP 140/57 | HR 60 | Temp 98.0°F | Ht 65.0 in | Wt 140.6 lb

## 2015-08-09 DIAGNOSIS — J309 Allergic rhinitis, unspecified: Secondary | ICD-10-CM

## 2015-08-09 DIAGNOSIS — R635 Abnormal weight gain: Secondary | ICD-10-CM

## 2015-08-09 DIAGNOSIS — Z6823 Body mass index (BMI) 23.0-23.9, adult: Secondary | ICD-10-CM

## 2015-08-09 DIAGNOSIS — J329 Chronic sinusitis, unspecified: Secondary | ICD-10-CM

## 2015-08-09 DIAGNOSIS — I10 Essential (primary) hypertension: Secondary | ICD-10-CM | POA: Diagnosis not present

## 2015-08-09 MED ORDER — FLUTICASONE PROPIONATE 50 MCG/ACT NA SUSP: 2.0000 | Freq: Every day | NASAL | Status: DC

## 2015-08-09 MED ORDER — CETIRIZINE HCL 10 MG PO TABS: 10.0000 mg | ORAL_TABLET | Freq: Every day | ORAL | Status: DC

## 2015-08-09 MED ORDER — LEVOFLOXACIN 500 MG PO TABS: 500.0000 mg | ORAL_TABLET | Freq: Every day | ORAL | Status: DC

## 2015-08-09 NOTE — Progress Notes (Signed)
Subjective:    Patient ID: Susan Petty, female    DOB: 22-Dec-1945, 70 y.o.   MRN: [id number]  CC: Sinus Pressure and Nasal Congestion   HPI: Susan Petty is a 70 y.o. female presenting for Sinus Pressure and Nasal Congestion  Took 2 weeks of augmentin for acute sinusitis, stopped over a week ago. Congestion returned as soon as she started Has constant sinus drainage Low grade fevers returned Headaches returned, not present all the time but there between eyes  Feels like she is having weight gain, harder to lose weight than in prior years   Depression screen [facility] 2/9 08/09/2015 07/20/2015 03/14/2015 08/29/2014 01/11/2014  Decreased Interest 0 0 0 0 0  Down, Depressed, Hopeless 0 0 2 2 1   PHQ - 2 Score 0 0 2 2 1   Altered sleeping - - 0 0 -  Tired, decreased energy - - 1 1 -  Change in appetite - - 1 0 -  Feeling bad or failure about yourself  - - 0 0 -  Trouble concentrating - - 0 0 -  Moving slowly or fidgety/restless - - 0 0 -  Suicidal thoughts - - 0 0 -  PHQ-9 Score - - 4 3 -     Relevant past medical, surgical, family and social history reviewed and updated as indicated. Interim medical history since our last visit reviewed. Allergies and medications reviewed and updated.    ROS: Per HPI unless specifically indicated above  History  Smoking status  . Never Smoker   Smokeless tobacco  . Never Used    Past Medical History Patient Active Problem List   Diagnosis Date Noted  . Body mass index (BMI) of 23.0-23.9 in adult 07/20/2015  . GAD (generalized anxiety disorder) 03/14/2015  . Osteoporosis with pathological fracture 04/13/2014  . GERD (gastroesophageal reflux disease) 12/14/2012  . Type 2 diabetes mellitus with insulin therapy ([place]) 12/14/2012  . Hyperlipidemia 12/14/2012  . Hypertension 12/14/2012  . Acute blood loss anemia 09/05/2011  . Hypokalemia 09/05/2011  . S/P left THA, 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        Objective:    BP 140/57 mmHg  Pulse 60   Temp(Src) 98 F (36.7 C) (Oral)  Ht 5\' 5"  (1.651 m)  Wt 140 lb 9.6 oz (63.776 kg)  BMI 23.40 kg/m2  Wt Readings from Last 3 Encounters:  08/09/15 140 lb 9.6 oz (63.776 kg)  07/20/15 141 lb (63.957 kg)  06/29/15 137 lb (62.143 kg)     Gen: NAD, alert, cooperative with exam, NCAT EYES: EOMI, no scleral injection or icterus ENT:  TMs pearly gray b/l, OP without erythema, tender over b/l frontal sinuses, slightly tender of max sinuses b/l LYMPH: no cervical LAD CV: NRRR, normal S1/S2, no murmur, distal pulses 2+ b/l Resp: CTABL, no wheezes, normal WOB Abd: +BS, soft, NTND. no guarding or organomegaly Ext: No edema, warm Neuro: Alert and oriented MSK: normal muscle bulk     Assessment & Plan:    [name] was seen today for sinus pressure and nasal congestion.  Diagnoses and all orders for this visit:  Recurrent sinusitis -     levofloxacin (LEVAQUIN) 500 MG tablet; Take 1 tablet (500 mg total) by mouth daily.  Allergic rhinitis, unspecified allergic rhinitis type -     cetirizine (ZYRTEC) 10 MG tablet; Take 1 tablet (10 mg total) by mouth daily. -     fluticasone (FLONASE) 50 MCG/ACT nasal spray; Place 2 sprays into both nostrils  daily.  Weight gain -     TSH  Essential hypertension Adequate control today, higher per pt than normal. Watch BPs at home. Cont current meds.  Body mass index (BMI) of 23.0-23.9 in adult Continue regular exercise for healthy lifestyle and bone health. Goal to maintain weight.   Follow up plan: Return in about 4 weeks (around 09/06/2015). For DM2 visit, needs HgA1c  Assunta Found, MD [facility] 08/09/2015, 3:12 PM

## 2015-08-09 NOTE — Patient Instructions (Addendum)
Use salt sprays daily to help get mucus out of sinuses  Start levofloxacin antibiotic to treat recurrent infection  Start allergy medicines, nasal spray flonase you can wait until after you are feeling better from infection. OK to start the oral cetirizine.  Keep checking blood pressures at home. Let me know if regularly elevated, top number >150 or bottom number >90.

## 2015-08-10 LAB — TSH: TSH: 2.66 u[IU]/mL (ref 0.450–4.500)

## 2015-08-21 ENCOUNTER — Ambulatory Visit: Payer: Medicare Other | Admitting: Pharmacist

## 2015-09-04 ENCOUNTER — Other Ambulatory Visit: Payer: Self-pay | Admitting: Nurse Practitioner

## 2015-09-08 ENCOUNTER — Ambulatory Visit: Payer: Medicare Other | Admitting: Pharmacist

## 2015-09-12 DIAGNOSIS — G44201 Tension-type headache, unspecified, intractable: Secondary | ICD-10-CM | POA: Diagnosis not present

## 2015-09-12 DIAGNOSIS — J310 Chronic rhinitis: Secondary | ICD-10-CM | POA: Diagnosis not present

## 2015-11-01 ENCOUNTER — Other Ambulatory Visit: Payer: Self-pay | Admitting: Family Medicine

## 2015-11-20 ENCOUNTER — Other Ambulatory Visit: Payer: Self-pay | Admitting: Nurse Practitioner

## 2015-12-01 ENCOUNTER — Other Ambulatory Visit: Payer: Self-pay | Admitting: Nurse Practitioner

## 2015-12-06 ENCOUNTER — Telehealth: Payer: Self-pay | Admitting: Pediatrics

## 2016-01-05 ENCOUNTER — Ambulatory Visit (INDEPENDENT_AMBULATORY_CARE_PROVIDER_SITE_OTHER): Payer: Medicare Other | Admitting: Pediatrics

## 2016-01-05 ENCOUNTER — Encounter: Payer: Self-pay | Admitting: Pediatrics

## 2016-01-05 VITALS — BP 151/58 | HR 59 | Temp 97.8°F | Ht 65.0 in | Wt 145.6 lb

## 2016-01-05 DIAGNOSIS — I10 Essential (primary) hypertension: Secondary | ICD-10-CM

## 2016-01-05 DIAGNOSIS — M8000XD Age-related osteoporosis with current pathological fracture, unspecified site, subsequent encounter for fracture with routine healing: Secondary | ICD-10-CM

## 2016-01-05 DIAGNOSIS — J309 Allergic rhinitis, unspecified: Secondary | ICD-10-CM | POA: Diagnosis not present

## 2016-01-05 DIAGNOSIS — Z794 Long term (current) use of insulin: Secondary | ICD-10-CM

## 2016-01-05 DIAGNOSIS — K219 Gastro-esophageal reflux disease without esophagitis: Secondary | ICD-10-CM

## 2016-01-05 DIAGNOSIS — E119 Type 2 diabetes mellitus without complications: Secondary | ICD-10-CM | POA: Diagnosis not present

## 2016-01-05 DIAGNOSIS — E785 Hyperlipidemia, unspecified: Secondary | ICD-10-CM

## 2016-01-05 LAB — BAYER DCA HB A1C WAIVED: HB A1C: 8 % — AB (ref <7.0)

## 2016-01-05 MED ORDER — ALENDRONATE SODIUM 70 MG PO TABS: ORAL_TABLET | ORAL | Status: DC

## 2016-01-05 MED ORDER — OMEPRAZOLE 40 MG PO CPDR: 40.0000 mg | DELAYED_RELEASE_CAPSULE | Freq: Every day | ORAL | Status: DC

## 2016-01-05 MED ORDER — BENAZEPRIL HCL 40 MG PO TABS: 40.0000 mg | ORAL_TABLET | Freq: Every day | ORAL | Status: DC

## 2016-01-05 MED ORDER — INSULIN NPH (HUMAN) (ISOPHANE) 100 UNIT/ML ~~LOC~~ SUSP: SUBCUTANEOUS | Status: DC

## 2016-01-05 MED ORDER — HYDROCHLOROTHIAZIDE 25 MG PO TABS
25.0000 mg | ORAL_TABLET | Freq: Every day | ORAL | Status: DC
Start: 2016-01-05 — End: 2016-07-02

## 2016-01-05 MED ORDER — INSULIN REGULAR HUMAN 100 UNIT/ML IJ SOLN: 5.0000 [IU] | Freq: Three times a day (TID) | INTRAMUSCULAR | Status: DC

## 2016-01-05 MED ORDER — METFORMIN HCL 500 MG PO TABS: 500.0000 mg | ORAL_TABLET | Freq: Two times a day (BID) | ORAL | Status: DC

## 2016-01-05 MED ORDER — AMLODIPINE BESYLATE 5 MG PO TABS: 5.0000 mg | ORAL_TABLET | Freq: Every day | ORAL | Status: DC

## 2016-01-05 MED ORDER — METOPROLOL TARTRATE 25 MG PO TABS: 25.0000 mg | ORAL_TABLET | Freq: Two times a day (BID) | ORAL | Status: DC

## 2016-01-05 MED ORDER — FLUTICASONE PROPIONATE 50 MCG/ACT NA SUSP: 2.0000 | Freq: Every day | NASAL | Status: DC

## 2016-01-05 NOTE — Progress Notes (Signed)
Subjective:    Patient ID: Susan Petty, female    DOB: [birth year], 70 y.o.   MRN: [id number]  CC: Follow-up multiple med problems  HPI: Susan Petty is a 70 y.o. female presenting for Follow-up  Works sitting with elderly lady  Feels like she has had decreased energy for months. SOmetimes thinks she has a slow heart rate  DM2: checks sugars several times a day. Highest in the afternoon, is around 300. In the morning is 70-150. Takes 25 units NPH in the morning, 8u NPH at 5pm, 5units when high.  HTN: No dizziness, headaches. No swelling in LE.   Depression screen [facility] 2/9 01/05/2016 08/09/2015 07/20/2015 03/14/2015 08/29/2014  Decreased Interest 0 0 0 0 0  Down, Depressed, Hopeless 0 0 0 2 2  PHQ - 2 Score 0 0 0 2 2  Altered sleeping - - - 0 0  Tired, decreased energy - - - 1 1  Change in appetite - - - 1 0  Feeling bad or failure about yourself  - - - 0 0  Trouble concentrating - - - 0 0  Moving slowly or fidgety/restless - - - 0 0  Suicidal thoughts - - - 0 0  PHQ-9 Score - - - 4 3     Relevant past medical, surgical, family and social history reviewed and updated as indicated.  Interim medical history since our last visit reviewed. Allergies and medications reviewed and updated.  ROS: Per HPI unless specifically indicated above  History  Smoking status  . Never Smoker   Smokeless tobacco  . Never Used       Objective:    BP 151/58 mmHg  Pulse 59  Temp(Src) 97.8 F (36.6 C) (Oral)  Ht _0  (1.651 m)  Wt 145 lb 9.6 oz (66.044 kg)  BMI 24.23 kg/m2  Wt Readings from Last 3 Encounters:  01/05/16 145 lb 9.6 oz (66.044 kg)  08/09/15 140 lb 9.6 oz (63.776 kg)  07/20/15 141 lb (63.957 kg)     Gen: NAD, alert, cooperative with exam, NCAT EYES: EOMI, no scleral injection or icterus CV: WWP Resp: normal WOB Ext: No edema, warm Neuro: Alert and oriented, strength equal b/l UE and LE, coordination grossly normal MSK: normal muscle bulk     Assessment &  Plan:    Susan Petty was seen today for follow-up multiple med problems.  Diagnoses and all orders for this visit:  Type 2 diabetes mellitus with insulin therapy ([place]) -     Bayer DCA Hb A1c Waived -     BMP8+EGFR -     insulin NPH Human (HUMULIN N) 100 UNIT/ML injection; Inject 25 units each morning and 10 unit each evening -     insulin regular (NOVOLIN R,HUMULIN R) 100 units/mL injection; Inject 0.05 mLs (5 Units total) into the skin 3 (three) times daily before meals. -     metFORMIN (GLUCOPHAGE) 500 MG tablet; Take 1 tablet (500 mg total) by mouth 2 (two) times daily with a meal.  Allergic rhinitis, unspecified allergic rhinitis type -     fluticasone (FLONASE) 50 MCG/ACT nasal spray; Place 2 sprays into both nostrils daily.  Essential hypertension Will decrease metoprolol given low HR. Check BPs at home, bring back log. -     hydrochlorothiazide (HYDRODIURIL) 25 MG tablet; Take 1 tablet (25 mg total) by mouth daily. -     metoprolol (LOPRESSOR) 25 MG tablet; Take 1 tablet (25 mg total) by mouth 2 (two)  times daily. -     amLODipine (NORVASC) 5 MG tablet; Take 1 tablet (5 mg total) by mouth daily. -     benazepril (LOTENSIN) 40 MG tablet; Take 1 tablet (40 mg total) by mouth daily.  Gastroesophageal reflux disease without esophagitis -     omeprazole (PRILOSEC) 40 MG capsule; Take 1 capsule (40 mg total) by mouth daily.  Osteoporosis with pathological fracture, with routine healing, subsequent encounter -     alendronate (FOSAMAX) 70 MG tablet; TAKE ONE TABLET BY MOUTH ONCE A WEEK WITH A FULL GLASS OF WATER ON AN EMPTY STOMACH   Follow up plan: Return in about 3 weeks (around 01/26/2016).  Assunta Found, MD [facility] 01/05/2016, 9:22 AM

## 2016-01-06 LAB — BMP8+EGFR
BUN / CREAT RATIO: 16 (ref 12–28)
BUN: 11 mg/dL (ref 8–27)
CHLORIDE: 98 mmol/L (ref 96–106)
CO2: 28 mmol/L (ref 18–29)
Calcium: 9.5 mg/dL (ref 8.7–10.3)
Creatinine, Ser: 0.69 mg/dL (ref 0.57–1.00)
GFR calc non Af Amer: 87 mL/min/{1.73_m2} (ref >59)
GFR, EST AFRICAN AMERICAN: 101 mL/min/{1.73_m2} (ref >59)
Glucose: 227 mg/dL — ABNORMAL HIGH (ref 65–99)
POTASSIUM: 5.1 mmol/L (ref 3.5–5.2)
Sodium: 141 mmol/L (ref 134–144)

## 2016-01-22 ENCOUNTER — Other Ambulatory Visit: Payer: Self-pay | Admitting: Nurse Practitioner

## 2016-01-26 ENCOUNTER — Encounter: Payer: Self-pay | Admitting: Pediatrics

## 2016-01-26 ENCOUNTER — Ambulatory Visit (INDEPENDENT_AMBULATORY_CARE_PROVIDER_SITE_OTHER): Payer: Medicare Other | Admitting: Pediatrics

## 2016-01-26 VITALS — BP 139/75 | HR 68 | Temp 98.1°F | Ht 65.0 in | Wt 144.0 lb

## 2016-01-26 DIAGNOSIS — I10 Essential (primary) hypertension: Secondary | ICD-10-CM | POA: Diagnosis not present

## 2016-01-26 DIAGNOSIS — E119 Type 2 diabetes mellitus without complications: Secondary | ICD-10-CM

## 2016-01-26 DIAGNOSIS — K219 Gastro-esophageal reflux disease without esophagitis: Secondary | ICD-10-CM

## 2016-01-26 DIAGNOSIS — Z794 Long term (current) use of insulin: Secondary | ICD-10-CM | POA: Diagnosis not present

## 2016-01-26 MED ORDER — OMEPRAZOLE 40 MG PO CPDR: 40.0000 mg | DELAYED_RELEASE_CAPSULE | Freq: Two times a day (BID) | ORAL | Status: DC

## 2016-01-26 NOTE — Progress Notes (Signed)
    Subjective:    Patient ID: Susan Petty, female    DOB: 1946/04/11, 70 y.o.   MRN: [id number]  CC: Follow-up Dm2  HPI: Susan Petty is a 70 y.o. female presenting for Follow-up  DM2: regularly has lows in middle of day down to 56s, 50s-70s Taking NPH twice a day, regular insulin 5 units when she "thinks I need it"  Symptomatic, feels weak and shaky Eats two meals a day, supper is at 3-4 pm Morning BGLs 150s-180s Most recent HgA1c 7.2  GERD:  Still with some symptoms Eating lots of vegetables, especially likes tomatos and onions Taking omeprazole daily  HTN: no headaches, vision changes Taking meds regularly  Depression screen [facility] 2/9 01/26/2016 01/05/2016 08/09/2015 07/20/2015 03/14/2015  Decreased Interest 0 0 0 0 0  Down, Depressed, Hopeless 0 0 0 0 2  PHQ - 2 Score 0 0 0 0 2  Altered sleeping - - - - 0  Tired, decreased energy - - - - 1  Change in appetite - - - - 1  Feeling bad or failure about yourself  - - - - 0  Trouble concentrating - - - - 0  Moving slowly or fidgety/restless - - - - 0  Suicidal thoughts - - - - 0  PHQ-9 Score - - - - 4     Relevant past medical, surgical, family and social history reviewed and updated as indicated.  Interim medical history since our last visit reviewed. Allergies and medications reviewed and updated.  ROS: Per HPI unless specifically indicated above  History  Smoking status  . Never Smoker   Smokeless tobacco  . Never Used       Objective:    BP 139/75 mmHg  Pulse 68  Temp(Src) 98.1 F (36.7 C) (Oral)  Ht 5\' 5"  (1.651 m)  Wt 144 lb (65.318 kg)  BMI 23.96 kg/m2  Wt Readings from Last 3 Encounters:  01/26/16 144 lb (65.318 kg)  01/05/16 145 lb 9.6 oz (66.044 kg)  08/09/15 140 lb 9.6 oz (63.776 kg)     Gen: NAD, alert, cooperative with exam, NCAT EYES: EOMI, no scleral injection or icterus ENT:  TMs pearly gray b/l, OP without erythema LYMPH: no cervical LAD CV: NRRR, normal S1/S2, no murmur, distal  pulses 2+ b/l Resp: CTABL, no wheezes, normal WOB Ext: No edema, warm Neuro: Alert and oriented     Assessment & Plan:    Aabha was seen today for follow-up DM2.  Diagnoses and all orders for this visit:  Gastroesophageal reflux disease without esophagitis Discussed to avoid Diet modification Can try omeprazole BID If not improving let me know Appetite normal No early satiety -     omeprazole (PRILOSEC) 40 MG capsule; Take 1 capsule (40 mg total) by mouth 2 (two) times daily.  Type 2 diabetes mellitus with insulin therapy ([place]) Well controlled hgA1c but pt with lows NPH: decrease to 20 units in the morning (was at 25) Continue 8 units at night Take 4 units of regular insulin with meals.  Let me know if regularly over 200 or under 90  Essential hypertension Adequate control, continue meds   Follow up plan: Return in about 2 months (around 03/28/2016).  Assunta Found, MD [facility] 01/26/2016, 2:38 PM

## 2016-01-26 NOTE — Patient Instructions (Signed)
NPH: decrease to 20 units in the morning Continue 8 units at night  Take 4 units of regular insulin with meals.   If you have low blood sugar levels <90, let me know, because we should decrease your insulin more.

## 2016-01-31 ENCOUNTER — Other Ambulatory Visit: Payer: Self-pay | Admitting: Pediatrics

## 2016-02-01 NOTE — Telephone Encounter (Signed)
Please review dosage does not match up.

## 2016-02-05 ENCOUNTER — Encounter: Payer: Medicare Other | Admitting: *Deleted

## 2016-02-05 DIAGNOSIS — Z1231 Encounter for screening mammogram for malignant neoplasm of breast: Secondary | ICD-10-CM | POA: Diagnosis not present

## 2016-02-23 ENCOUNTER — Encounter: Payer: Self-pay | Admitting: Pediatrics

## 2016-02-23 ENCOUNTER — Ambulatory Visit (INDEPENDENT_AMBULATORY_CARE_PROVIDER_SITE_OTHER): Payer: Medicare Other | Admitting: Pediatrics

## 2016-02-23 VITALS — BP 139/54 | HR 62 | Temp 97.7°F | Ht 65.0 in | Wt 142.6 lb

## 2016-02-23 DIAGNOSIS — I10 Essential (primary) hypertension: Secondary | ICD-10-CM | POA: Diagnosis not present

## 2016-02-23 DIAGNOSIS — E119 Type 2 diabetes mellitus without complications: Secondary | ICD-10-CM | POA: Diagnosis not present

## 2016-02-23 DIAGNOSIS — R140 Abdominal distension (gaseous): Secondary | ICD-10-CM | POA: Diagnosis not present

## 2016-02-23 DIAGNOSIS — Z794 Long term (current) use of insulin: Secondary | ICD-10-CM | POA: Diagnosis not present

## 2016-02-23 DIAGNOSIS — K219 Gastro-esophageal reflux disease without esophagitis: Secondary | ICD-10-CM | POA: Diagnosis not present

## 2016-02-23 NOTE — Patient Instructions (Signed)
Decrease to 5u NPH at night Continue 20u in the morning  If you are regularly having numbers less than 100 let me know

## 2016-02-23 NOTE — Progress Notes (Signed)
  Subjective:   Patient ID: Susan Petty, female    DOB: [birth year], 70 y.o.   MRN: [id number] CC: Follow-up (3 week)  HPI: Susan Petty is a 70 y.o. female presenting for Follow-up (3 week)  GER: Does have symptoms most days Has been taking nexium in the morning  Has had reflux for years but started getting worse over the last few weeks Appetite down Doesn't get hungry regularly Eating 2 meals a day Eats supper with vegetables around 4pm  DM2: No more low numbers Taking NPH 20u in the morning, 8 u at night numbres less than 200  Relevant past medical, surgical, family and social history reviewed. Allergies and medications reviewed and updated. History  Smoking Status  . Never Smoker  Smokeless Tobacco  . Never Used   ROS: Per HPI   Objective:    BP (!) 139/54   Pulse 62   Temp 97.7 F (36.5 C) (Oral)   Ht 5\' 5"  (1.651 m)   Wt 142 lb 9.6 oz (64.7 kg)   BMI 23.73 kg/m   Wt Readings from Last 3 Encounters:  02/23/16 142 lb 9.6 oz (64.7 kg)  01/26/16 144 lb (65.3 kg)  01/05/16 145 lb 9.6 oz (66 kg)   Gen: NAD, alert, cooperative with exam, NCAT EYES: EOMI, no conjunctival injection, or no icterus CV: NRRR, normal S1/S2, no murmur Resp: CTABL, no wheezes, normal WOB Abd: +BS, soft, NTND. no guarding or organomegaly Neuro: Alert and oriented, strength equal b/l UE and LE, coordination grossly normal MSK: normal muscle bulk  Assessment & Plan:  Susan Petty was seen today for follow-up multiple med problems  Diagnoses and all orders for this visit:  Essential hypertension Adequate control Cont meds  Type 2 diabetes mellitus with insulin therapy ([place]) Decrease to 5u NPH at night, cont 20u in the day Novolog only with carb heavy meal  Gastroesophageal reflux disease, esophagitis presence not specified Ongoing symptoms, bloating despite taking BID PPI Appetite down Will refer to GI for EGD  Follow up plan: Return in about 10 weeks (around 05/03/2016) for  DM2 follow up. Susan Found, MD [place]

## 2016-02-26 ENCOUNTER — Encounter: Payer: Self-pay | Admitting: Gastroenterology

## 2016-03-05 DIAGNOSIS — D225 Melanocytic nevi of trunk: Secondary | ICD-10-CM | POA: Diagnosis not present

## 2016-03-05 DIAGNOSIS — Z1283 Encounter for screening for malignant neoplasm of skin: Secondary | ICD-10-CM | POA: Diagnosis not present

## 2016-03-05 DIAGNOSIS — X32XXXD Exposure to sunlight, subsequent encounter: Secondary | ICD-10-CM | POA: Diagnosis not present

## 2016-03-05 DIAGNOSIS — L570 Actinic keratosis: Secondary | ICD-10-CM | POA: Diagnosis not present

## 2016-03-13 ENCOUNTER — Telehealth: Payer: Self-pay | Admitting: Pediatrics

## 2016-03-14 NOTE — Telephone Encounter (Signed)
noted 

## 2016-03-20 ENCOUNTER — Encounter: Payer: Self-pay | Admitting: Nurse Practitioner

## 2016-03-20 ENCOUNTER — Other Ambulatory Visit: Payer: Self-pay

## 2016-03-20 ENCOUNTER — Ambulatory Visit (INDEPENDENT_AMBULATORY_CARE_PROVIDER_SITE_OTHER): Payer: Medicare Other | Admitting: Nurse Practitioner

## 2016-03-20 VITALS — BP 152/59 | HR 60 | Temp 98.0°F | Ht 65.5 in | Wt 142.6 lb

## 2016-03-20 DIAGNOSIS — K219 Gastro-esophageal reflux disease without esophagitis: Secondary | ICD-10-CM

## 2016-03-20 DIAGNOSIS — R194 Change in bowel habit: Secondary | ICD-10-CM

## 2016-03-20 DIAGNOSIS — R140 Abdominal distension (gaseous): Secondary | ICD-10-CM | POA: Insufficient documentation

## 2016-03-20 MED ORDER — NA SULFATE-K SULFATE-MG SULF 17.5-3.13-1.6 GM/177ML PO SOLN: 1.0000 | ORAL | 0 refills | Status: DC

## 2016-03-20 NOTE — Progress Notes (Signed)
REVIEWED-NO ADDITIONAL RECOMMENDATIONS.  Primary Care Physician:  Eustaquio Maize, MD Primary Gastroenterologist:  Dr. Oneida Alar  Chief Complaint  Patient presents with  . Gastroesophageal Reflux  . Bloated    HPI:   Susan Petty is a 70 y.o. female who presents on referral from PCP for reflux and bloating. PCP notes reviewed. Patient saw primary care 01/26/2016. At that time noted GERD still occasional symptoms, eats lots of vegetables especially likes tomatoes. Taking omeprazole daily. PCP discussed diet modification and trigger avoidance, changed omeprazole to twice daily. All of visit 02/23/2016 notes continued symptoms on most days, taking Nexium in the morning. Has had reflux for years been getting worse. She was referred to GI at that point.  Today she states her symptoms are intermittent but persistent. Has a hiatal hernia. Symptoms include burning, bitter taste, persistent cough. Not worse at night. Has symptoms multiple times a week. Is taking Omeprazole twice a day, which has helped some but not resolved her symptoms. Nexium didn't help. Denies hematochezia, melena, abdominal pain, N/V. Has a lot of fluctuance and constipation and needs to take laxatives to go, this has been the case for the past couple months; previously went regularly. Only new medication in the past couple months is Metformin. Denies dysphagia.Describes bloating and early satiety. Denies chest pain, dyspnea, dizziness, lightheadedness, syncope, near syncope. Denies any other upper or lower GI symptoms.  Has never had a colonoscopy.  Past Medical History:  Diagnosis Date  . Diabetes mellitus   . GERD (gastroesophageal reflux disease)   . Hypertension   . Osteoporosis     Past Surgical History:  Procedure Laterality Date  . 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 ACCU-CHEK AVIVA PLUS test strip USE TO CHECK GLUCOSE 4 TIMES DAILY 120 each 11  . alendronate (FOSAMAX) 70 MG tablet TAKE ONE TABLET BY MOUTH ONCE A WEEK WITH A FULL GLASS OF WATER ON AN EMPTY STOMACH 4 tablet 2  . amLODipine (NORVASC) 5 MG tablet Take 1 tablet (5 mg total) by mouth daily. 30 tablet 4  . aspirin EC 81 MG tablet Take 81 mg by mouth daily.    . benazepril (LOTENSIN) 40 MG tablet Take 1 tablet (40 mg total) by mouth daily. 30 tablet 4  . cetirizine (ZYRTEC) 10 MG tablet Take 1 tablet (10 mg total) by mouth daily. 30 tablet 11  . Cholecalciferol (VITAMIN D-3 PO) Take 2,000 Units by mouth daily.    . fluticasone (FLONASE) 50 MCG/ACT nasal spray Place 2 sprays into both nostrils daily. 16 g 6  . glucose blood test strip Use as instructed 100 each 2  . hydrochlorothiazide (HYDRODIURIL) 25 MG tablet Take 1 tablet (25 mg total) by mouth daily. 30 tablet 4  . insulin NPH Human (HUMULIN N) 100 UNIT/ML injection Inject 25 units each morning and 10 unit each evening 30 mL 1  . insulin regular (NOVOLIN R,HUMULIN R) 100 units/mL injection Inject 0.05 mLs (5 Units total) into the skin 3 (three) times daily before meals. (Patient taking differently: Inject 5 Units into the skin as needed. ) 10 mL 5  . metFORMIN (GLUCOPHAGE) 500 MG tablet Take 1 tablet (500 mg total) by mouth 2 (two) times daily with a meal. 60 tablet 3  . metoprolol (LOPRESSOR) 25 MG tablet Take  1 tablet (25 mg total) by mouth 2 (two) times daily. (Patient taking differently: Take 12.5 mg by mouth 2 (two) times daily. ) 60 tablet 3  . omeprazole (PRILOSEC) 40 MG capsule Take 1 capsule (40 mg total) by mouth 2 (two) times daily. 60 capsule 0   No current facility-administered medications for this visit.     Allergies as of 03/20/2016 - Review Complete 03/20/2016  Allergen Reaction Noted  . Sulfa antibiotics  06/20/2012  . Sulfa drugs cross reactors Rash 09/02/2011     Family History  Problem Relation Age of Onset  . Hypertension Mother   . Hypertension Father   . Diabetes Father   . Hypertension Sister   . Cancer Sister   . Diabetes Brother   . Diabetes Brother   . Colon cancer Neg Hx     Social History   Social History  . Marital status: Widowed    Spouse name: N/A  . Number of children: N/A  . Years of education: N/A   Occupational History  . Not on file.   Social History Main Topics  . Smoking status: Never Smoker  . Smokeless tobacco: Never Used  . Alcohol use Yes     Comment: occasional wine  . Drug use: No  . Sexual activity: Yes    Birth control/ protection: None   Other Topics Concern  . Not on file   Social History Narrative  . No narrative on file    Review of Systems: 10-point ROS negative except as per HPI.    Physical Exam: BP (!) 152/59   Pulse 60   Temp 98 F (36.7 C) (Oral)   Ht 5' 5.5" (1.664 m)   Wt 142 lb 9.6 oz (64.7 kg)   BMI 23.37 kg/m  General:   Alert and oriented. Pleasant and cooperative. Well-nourished and well-developed.  Head:  Normocephalic and atraumatic. Eyes:  Without icterus, sclera clear and conjunctiva pink.  Ears:  Normal auditory acuity. Cardiovascular:  S1, S2 present without murmurs appreciated. Extremities without clubbing or edema. Respiratory:  Clear to auscultation bilaterally. No wheezes, rales, or rhonchi. No distress.  Gastrointestinal:  +BS, soft, non-tender and non-distended. No HSM noted. No guarding or rebound. No masses appreciated.  Rectal:  Deferred  Musculoskalatal:  Symmetrical without gross deformities. Neurologic:  Alert and oriented x4;  grossly normal neurologically. Psych:  Alert and cooperative. Normal mood and affect. Heme/Lymph/Immune: No excessive bruising noted.    03/20/2016 11:06 AM   Disclaimer: This note was dictated with voice recognition software. Similar sounding words can inadvertently be transcribed and may not be corrected upon  review.

## 2016-03-20 NOTE — Patient Instructions (Signed)
1. Keep taking your acid blocker. 2. We will schedule your procedure for you. 3. Return for follow-up in 3 months.

## 2016-03-22 ENCOUNTER — Other Ambulatory Visit: Payer: Self-pay

## 2016-03-22 ENCOUNTER — Telehealth: Payer: Self-pay

## 2016-03-22 NOTE — Telephone Encounter (Signed)
Called pt and she is going to keep Suprep for now and she will call pharmacist to double check if it is ok for her to take it.

## 2016-03-22 NOTE — Telephone Encounter (Signed)
Pt called- she picked up her prep and she is allergic to sulfa and she thinks she may be allergic to the suprep. She doesn't want to risk taking it and is requesting another prep sent in to the pharmacy. Her procedure is next week. I advised the pt that the instructions would be different and she asked that the instructions be faxed to the pharmacy so she can look at them and will call next week if she has any questions.

## 2016-03-29 ENCOUNTER — Encounter (HOSPITAL_COMMUNITY)
Admission: RE | Disposition: A | Discharge status: Home / Self Care | Payer: Self-pay | Source: Ambulatory Visit | Attending: Gastroenterology

## 2016-03-29 ENCOUNTER — Ambulatory Visit (HOSPITAL_COMMUNITY)
Admission: RE | Admit: 2016-03-29 | Discharge: 2016-03-29 | Disposition: A | Discharge status: Home / Self Care | Payer: Medicare Other | Source: Ambulatory Visit | Attending: Gastroenterology | Admitting: Gastroenterology

## 2016-03-29 ENCOUNTER — Encounter (HOSPITAL_COMMUNITY): Payer: Self-pay | Admitting: *Deleted

## 2016-03-29 DIAGNOSIS — Z96642 Presence of left artificial hip joint: Secondary | ICD-10-CM | POA: Insufficient documentation

## 2016-03-29 DIAGNOSIS — K317 Polyp of stomach and duodenum: Secondary | ICD-10-CM | POA: Insufficient documentation

## 2016-03-29 DIAGNOSIS — K2970 Gastritis, unspecified, without bleeding: Secondary | ICD-10-CM | POA: Insufficient documentation

## 2016-03-29 DIAGNOSIS — E119 Type 2 diabetes mellitus without complications: Secondary | ICD-10-CM | POA: Diagnosis not present

## 2016-03-29 DIAGNOSIS — Z79899 Other long term (current) drug therapy: Secondary | ICD-10-CM | POA: Diagnosis not present

## 2016-03-29 DIAGNOSIS — K222 Esophageal obstruction: Secondary | ICD-10-CM | POA: Insufficient documentation

## 2016-03-29 DIAGNOSIS — Z7984 Long term (current) use of oral hypoglycemic drugs: Secondary | ICD-10-CM | POA: Insufficient documentation

## 2016-03-29 DIAGNOSIS — M810 Age-related osteoporosis without current pathological fracture: Secondary | ICD-10-CM | POA: Insufficient documentation

## 2016-03-29 DIAGNOSIS — Q438 Other specified congenital malformations of intestine: Secondary | ICD-10-CM | POA: Diagnosis not present

## 2016-03-29 DIAGNOSIS — Z7983 Long term (current) use of bisphosphonates: Secondary | ICD-10-CM | POA: Insufficient documentation

## 2016-03-29 DIAGNOSIS — K648 Other hemorrhoids: Secondary | ICD-10-CM | POA: Diagnosis not present

## 2016-03-29 DIAGNOSIS — K219 Gastro-esophageal reflux disease without esophagitis: Secondary | ICD-10-CM | POA: Insufficient documentation

## 2016-03-29 DIAGNOSIS — Z7982 Long term (current) use of aspirin: Secondary | ICD-10-CM | POA: Insufficient documentation

## 2016-03-29 DIAGNOSIS — Z794 Long term (current) use of insulin: Secondary | ICD-10-CM | POA: Diagnosis not present

## 2016-03-29 DIAGNOSIS — R12 Heartburn: Secondary | ICD-10-CM

## 2016-03-29 DIAGNOSIS — I10 Essential (primary) hypertension: Secondary | ICD-10-CM | POA: Diagnosis not present

## 2016-03-29 DIAGNOSIS — Z1211 Encounter for screening for malignant neoplasm of colon: Secondary | ICD-10-CM | POA: Diagnosis not present

## 2016-03-29 DIAGNOSIS — R1013 Epigastric pain: Secondary | ICD-10-CM | POA: Diagnosis not present

## 2016-03-29 DIAGNOSIS — R194 Change in bowel habit: Secondary | ICD-10-CM

## 2016-03-29 DIAGNOSIS — Z7951 Long term (current) use of inhaled steroids: Secondary | ICD-10-CM | POA: Diagnosis not present

## 2016-03-29 DIAGNOSIS — K449 Diaphragmatic hernia without obstruction or gangrene: Secondary | ICD-10-CM | POA: Insufficient documentation

## 2016-03-29 HISTORY — PX: COLONOSCOPY: SHX5424

## 2016-03-29 HISTORY — PX: ESOPHAGOGASTRODUODENOSCOPY: SHX5428

## 2016-03-29 LAB — GLUCOSE, CAPILLARY
GLUCOSE-CAPILLARY: 368 mg/dL — AB (ref 65–99)
GLUCOSE-CAPILLARY: 313 mg/dL — AB (ref 65–99)
GLUCOSE-CAPILLARY: 200 mg/dL — AB (ref 65–99)

## 2016-03-29 SURGERY — COLONOSCOPY
Anesthesia: Moderate Sedation

## 2016-03-29 MED ORDER — MIDAZOLAM HCL 5 MG/5ML IJ SOLN
INTRAMUSCULAR | Status: AC
  Filled 2016-03-29: qty 10

## 2016-03-29 MED ORDER — INSULIN ASPART 100 UNIT/ML ~~LOC~~ SOLN
3.0000 [IU] | Freq: Once | SUBCUTANEOUS | Status: AC
  Administered 2016-03-29: 3 [IU] via INTRAVENOUS
  Filled 2016-03-29: qty 0.03

## 2016-03-29 MED ORDER — MIDAZOLAM HCL 5 MG/5ML IJ SOLN
INTRAMUSCULAR | Status: DC | PRN
  Administered 2016-03-29 (×4): 1 mg via INTRAVENOUS
  Administered 2016-03-29: 2 mg via INTRAVENOUS

## 2016-03-29 MED ORDER — SODIUM CHLORIDE 0.9 % IV SOLN
INTRAVENOUS | Status: DC
  Administered 2016-03-29: 1000 mL via INTRAVENOUS

## 2016-03-29 MED ORDER — MEPERIDINE HCL 100 MG/ML IJ SOLN
INTRAMUSCULAR | Status: AC
  Filled 2016-03-29: qty 2

## 2016-03-29 MED ORDER — SIMETHICONE 40 MG/0.6ML PO SUSP
ORAL | Status: DC | PRN
  Administered 2016-03-29: 200 mL

## 2016-03-29 MED ORDER — LIDOCAINE VISCOUS 2 % MT SOLN
OROMUCOSAL | Status: AC
  Filled 2016-03-29: qty 15

## 2016-03-29 MED ORDER — LIDOCAINE VISCOUS 2 % MT SOLN
OROMUCOSAL | Status: DC | PRN
  Administered 2016-03-29: 4 mL via OROMUCOSAL

## 2016-03-29 MED ORDER — LINACLOTIDE 72 MCG PO CAPS: ORAL_CAPSULE | ORAL | 11 refills | Status: DC

## 2016-03-29 MED ORDER — OMEPRAZOLE 40 MG PO CPDR: DELAYED_RELEASE_CAPSULE | ORAL | 11 refills | Status: DC

## 2016-03-29 MED ORDER — MEPERIDINE HCL 100 MG/ML IJ SOLN
INTRAMUSCULAR | Status: DC | PRN
  Administered 2016-03-29: 25 mg via INTRAVENOUS
  Administered 2016-03-29: 50 mg via INTRAVENOUS

## 2016-03-29 NOTE — Progress Notes (Signed)
Post procedure blood sugar 200

## 2016-03-29 NOTE — Discharge Instructions (Addendum)
YOUR UNCONTROLLED REFLUX IS MOST LIKELY DUE TO EATING TOMATOES. I biopsied your stomach AND SMALL BOWEL.   DRINK WATER TO KEEP YOUR URINE LIGHT YELLOW.  AVOID TOMATOES AND OTHER FOOD AND DRINKS THAT TRIGER REFLUX. SEE INFO BELOW.  ADD LINZESS EVERY MORNING WITH BREAKFAST.  IT MAY CAUSE EXPLOSIVE DIARRHEA.   CONTINUE OMEPRAZOLE.  TAKE 30 MINUTES PRIOR TO YOUR MEALS TWICE DAILY.   STRICTLY FOLLOW A LOW FAT DIET.  MEATS SHOULD BE CHOPPED OR GROUND ONLY. Marland KitchenAVOID FRIED FOODS. SEE INFO BELOW.  YOUR BIOPSY RESULTS WILL BE AVAILABLE IN MY CHART AFTER SEP 26 AND MY OFFICE WILL CONTACT YOU IN 10-14 DAYS WITH YOUR RESULTS.   FOLLOW UP IN JAN 2018.   UPPER ENDOSCOPY AFTER CARE Read the instructions outlined below and refer to this sheet in the next week. These discharge instructions provide you with general information on caring for yourself after you leave the hospital. While your treatment has been planned according to the most current medical practices available, unavoidable complications occasionally occur. If you have any problems or questions after discharge, call DR. Jaydeen Darley, [phone].  After hours call the hospital at [phone] and ask for the GI doctor on call.  ACTIVITY  You may resume your regular activity, but move at a slower pace for the next 24 hours.   Take frequent rest periods for the next 24 hours.   Walking will help get rid of the air and reduce the bloated feeling in your belly (abdomen).   No driving for 24 hours (because of the medicine (anesthesia) used during the test).   You may shower.   Do not sign any important legal documents or operate any machinery for 24 hours (because of the anesthesia used during the test).    NUTRITION  Drink plenty of fluids.   You may resume your normal diet as instructed by your doctor.   Begin with a light meal and progress to your normal diet. Heavy or fried foods are harder to digest and may make you feel sick to your  stomach (nauseated).   Avoid alcoholic beverages for 24 hours or as instructed.    MEDICATIONS  You may resume your normal medications.   WHAT YOU CAN EXPECT TODAY  Some feelings of bloating in the abdomen.   Passage of more gas than usual.    IF YOU HAD A BIOPSY TAKEN DURING THE UPPER ENDOSCOPY:  Eat a soft diet IF YOU HAVE NAUSEA, BLOATING, ABDOMINAL PAIN, OR VOMITING.    FINDING OUT THE RESULTS OF YOUR TEST Not all test results are available during your visit. DR. Oneida Alar WILL CALL YOU WITHIN 14 DAYS OF YOUR PROCEDUE WITH YOUR RESULTS. Do not assume everything is normal if you have not heard from DR. Rayana Geurin, CALL HER OFFICE AT [phone].  SEEK IMMEDIATE MEDICAL ATTENTION AND CALL THE OFFICE: [phone] IF:  You have more than a spotting of blood in your stool.   Your belly is swollen (abdominal distention).   You are nauseated or vomiting.   You have a temperature over 101F.   You have abdominal pain or discomfort that is severe or gets worse throughout the day.  Colonoscopy, Care After Refer to this sheet in the next few weeks. These instructions provide you with information on caring for yourself after your procedure. Your health care provider may also give you more specific instructions. Your treatment has been planned according to current medical practices, but problems sometimes occur. Call your health care provider if you  have any problems or questions after your procedure. WHAT TO EXPECT AFTER THE PROCEDURE  After your procedure, it is typical to have the following:  A small amount of blood in your stool.  Moderate amounts of gas and mild abdominal cramping or bloating. HOME CARE INSTRUCTIONS  Do not drive, operate machinery, or sign important documents for 24 hours.  You may shower and resume your regular physical activities, but move at a slower pace for the first 24 hours.  Take frequent rest periods for the first 24 hours.  Walk around or put a  warm pack on your abdomen to help reduce abdominal cramping and bloating.  Drink enough fluids to keep your urine clear or pale yellow.  You may resume your normal diet as instructed by your health care provider. Avoid heavy or fried foods that are hard to digest.  Avoid drinking alcohol for 24 hours or as instructed by your health care provider.  Only take over-the-counter or prescription medicines as directed by your health care provider.  If a tissue sample (biopsy) was taken during your procedure:  Do not take aspirin or blood thinners for 7 days, or as instructed by your health care provider.  Do not drink alcohol for 7 days, or as instructed by your health care provider.  Eat soft foods for the first 24 hours. SEEK MEDICAL CARE IF: You have persistent spotting of blood in your stool 2-3 days after the procedure. SEEK IMMEDIATE MEDICAL CARE IF:  You have more than a small spotting of blood in your stool.  You pass large blood clots in your stool.  Your abdomen is swollen (distended).  You have nausea or vomiting.  You have a fever.  You have increasing abdominal pain that is not relieved with medicine.   This information is not intended to replace advice given to you by your health care provider. Make sure you discuss any questions you have with your health care provider.   Document Released: 02/06/2004 Document Revised: 04/14/2013 Document Reviewed: 03/01/2013 Elsevier Interactive Patient Education 2016 [place] and home remedies TO HELP CONTROL HEARTBURN/REFLUX.  You may eliminate or reduce the frequency of heartburn by making the following lifestyle changes:   Control your weight. Being overweight is a major risk factor for heartburn and GERD. Excess pounds put pressure on your abdomen, pushing up your stomach and causing acid to back up into your esophagus.    Eat smaller meals. 4 TO 6 MEALS A DAY. This reduces pressure on the lower  esophageal sphincter, helping to prevent the valve from opening and acid from washing back into your esophagus.    Loosen your belt. Clothes that fit tightly around your waist put pressure on your abdomen and the lower esophageal sphincter.     Eliminate heartburn triggers. Everyone has specific triggers. Common triggers such as fatty or fried foods, spicy food, tomato sauce, carbonated beverages, alcohol, chocolate, mint, garlic, onion, caffeine and nicotine may make heartburn worse.    Avoid stooping or bending. Tying your shoes is OK. Bending over for longer periods to weed your garden isn't, especially soon after eating.    Don't lie down after a meal. Wait at least three to four hours after eating before going to bed, and don't lie down right after eating.    PLACE THE HEAD OF YOUR BED ON 6 INCH BLOCKS.  Alternative medicine  Several home remedies exist for treating GERD, but they provide only temporary relief. They  include drinking baking soda (sodium bicarbonate) added to water or drinking other fluids such as baking soda mixed with cream of tartar and water.  Although these liquids create temporary relief by neutralizing, washing away or buffering acids, eventually they aggravate the situation by adding gas and fluid to your stomach, increasing pressure and causing more acid reflux. Further, adding more sodium to your diet may increase your blood pressure and add stress to your heart, and excessive bicarbonate ingestion can alter the acid-base balance in your body.   REFLUX   TREATMENT There are a number of medicines used to treat reflux including: Antacids.  ZANTAC Proton-pump inhibitors: PRILOSEC (OMEPRAZOLE)  HOME CARE INSTRUCTIONS Eat 2-3 hours before going to bed.  Try to reach and maintain a healthy weight. LOSE 10-20 LBS Do not eat just a few very large meals. Instead, eat 4 TO 6 smaller meals throughout the day.  Try to identify foods and beverages that make your  symptoms worse, and avoid these.  Avoid tight clothing.  Do not exercise right after eating.  High-Fiber Diet A high-fiber diet changes your normal diet to include more whole grains, legumes, fruits, and vegetables. Changes in the diet involve replacing refined carbohydrates with unrefined foods. The calorie level of the diet is essentially unchanged. The Dietary Reference Intake (recommended amount) for adult males is 38 grams per day. For adult females, it is 25 grams per day. Pregnant and lactating women should consume 28 grams of fiber per day. Fiber is the intact part of a plant that is not broken down during digestion. Functional fiber is fiber that has been isolated from the plant to provide a beneficial effect in the body. PURPOSE  Increase stool bulk.   Ease and regulate bowel movements.   Lower cholesterol.  INDICATIONS THAT YOU NEED MORE FIBER  Constipation and hemorrhoids.   Uncomplicated diverticulosis (intestine condition) and irritable bowel syndrome.   Weight management.   As a protective measure against hardening of the arteries (atherosclerosis), diabetes, and cancer.   GUIDELINES FOR INCREASING FIBER IN THE DIET  Start adding fiber to the diet slowly. A gradual increase of about 5 more grams (2 slices of whole-wheat bread, 2 servings of most fruits or vegetables, or 1 bowl of high-fiber cereal) per day is best. Too rapid an increase in fiber may result in constipation, flatulence, and bloating.   Drink enough water and fluids to keep your urine clear or pale yellow. Water, juice, or caffeine-free drinks are recommended. Not drinking enough fluid may cause constipation.   Eat a variety of high-fiber foods rather than one type of fiber.   Try to increase your intake of fiber through using high-fiber foods rather than fiber pills or supplements that contain small amounts of fiber.   The goal is to change the types of food eaten. Do not supplement your present diet  with high-fiber foods, but replace foods in your present diet.   INCLUDE A VARIETY OF FIBER SOURCES  Replace refined and processed grains with whole grains, canned fruits with fresh fruits, and incorporate other fiber sources. White rice, white breads, and most bakery goods contain little or no fiber.   Brown whole-grain rice, buckwheat oats, and many fruits and vegetables are all good sources of fiber. These include: broccoli, Brussels sprouts, cabbage, cauliflower, beets, sweet potatoes, white potatoes (skin on), carrots, tomatoes, eggplant, squash, berries, fresh fruits, and dried fruits.   Cereals appear to be the richest source of fiber. Cereal fiber is found in  whole grains and bran. Bran is the fiber-rich outer coat of cereal grain, which is largely removed in refining. In whole-grain cereals, the bran remains. In breakfast cereals, the largest amount of fiber is found in those with "bran" in their names. The fiber content is sometimes indicated on the label.   You may need to include additional fruits and vegetables each day.   In baking, for 1 cup white flour, you may use the following substitutions:   1 cup whole-wheat flour minus 2 tablespoons.   1/2 cup white flour plus 1/2 cup whole-wheat flour.     Low-Fat Diet BREADS, CEREALS, PASTA, RICE, DRIED PEAS, AND BEANS These products are high in carbohydrates and most are low in fat. Therefore, they can be increased in the diet as substitutes for fatty foods. They too, however, contain calories and should not be eaten in excess. Cereals can be eaten for snacks as well as for breakfast.  Include foods that contain fiber (fruits, vegetables, whole grains, and legumes). Research shows that fiber may lower blood cholesterol levels, especially the water-soluble fiber found in fruits, vegetables, oat products, and legumes. FRUITS AND VEGETABLES It is good to eat fruits and vegetables. Besides being sources of fiber, both are rich in  vitamins and some minerals. They help you get the daily allowances of these nutrients. Fruits and vegetables can be used for snacks and desserts. MEATS Limit lean meat, chicken, [place], and fish to no more than 6 ounces per day. Beef, Pork, and Lamb Use lean cuts of beef, pork, and lamb. Lean cuts include:  Extra-lean ground beef.  Arm roast.  Sirloin tip.  Center-cut ham.  Round steak.  Loin chops.  Rump roast.  Tenderloin.  Trim all fat off the outside of meats before cooking. It is not necessary to severely decrease the intake of red meat, but lean choices should be made. Lean meat is rich in protein and contains a highly absorbable form of iron. Premenopausal women, in particular, should avoid reducing lean red meat because this could increase the risk for low red blood cells (iron-deficiency anemia).  Chicken and [place] These are good sources of protein. The fat of poultry can be reduced by removing the skin and underlying fat layers before cooking. Chicken and [place] can be substituted for lean red meat in the diet. Poultry should not be fried or covered with high-fat sauces. Fish and Shellfish Fish is a good source of protein. Shellfish contain cholesterol, but they usually are low in saturated fatty acids. The preparation of fish is important. Like chicken and [place], they should not be fried or covered with high-fat sauces. EGGS Egg whites contain no fat or cholesterol. They can be eaten often. Try 1 to 2 egg whites instead of whole eggs in recipes or use egg substitutes that do not contain yolk.  MILK AND DAIRY PRODUCTS Use skim or 1% milk instead of 2% or whole milk. Decrease whole milk, natural, and processed cheeses. Use nonfat or low-fat (2%) cottage cheese or low-fat cheeses made from vegetable oils. Choose nonfat or low-fat (1 to 2%) yogurt. Experiment with evaporated skim milk in recipes that call for heavy cream. Substitute low-fat yogurt or low-fat cottage cheese for sour  cream in dips and salad dressings. Have at least 2 servings of low-fat dairy products, such as 2 glasses of skim (or 1%) milk each day to help get your daily calcium intake.  FATS AND OILS Butterfat, lard, and beef fats are high in saturated fat  and cholesterol. These should be avoided.Vegetable fats do not contain cholesterol. AVOID coconut oil, palm oil, and palm kernel oil, WHICH are very high in saturated fats. These should be limited. These fats are often used in bakery goods, processed foods, popcorn, oils, and nondairy creamers. Vegetable shortenings and some peanut butters contain hydrogenated oils, which are also saturated fats. Read the labels on these foods and check for saturated vegetable oils.  Desirable liquid vegetable oils are corn oil, cottonseed oil, olive oil, canola oil, safflower oil, soybean oil, and sunflower oil. Peanut oil is not as good, but small amounts are acceptable. Buy a heart-healthy tub margarine that has no partially hydrogenated oils in the ingredients. AVOID Mayonnaise and salad dressings often are made from unsaturated fats.  OTHER EATING TIPS Snacks  Most sweets should be limited as snacks. They tend to be rich in calories and fats, and their caloric content outweighs their nutritional value. Some good choices in snacks are graham crackers, melba toast, soda crackers, bagels (no egg), English muffins, fruits, and vegetables. These snacks are preferable to snack crackers, [place] fries, and chips. Popcorn should be air-popped or cooked in small amounts of liquid vegetable oil.  Desserts Eat fruit, low-fat yogurt, and fruit ices instead of pastries, cake, and cookies. Sherbet, angel food cake, gelatin dessert, frozen low-fat yogurt, or other frozen products that do not contain saturated fat (pure fruit juice bars, frozen ice pops) are also acceptable.   COOKING METHODS Choose those methods that use little or no fat. They include: Poaching.  Braising.  Steaming.    Grilling.  Baking.  Stir-frying.  Broiling.  Microwaving.  Foods can be cooked in a nonstick pan without added fat, or use a nonfat cooking spray in regular cookware. Limit fried foods and avoid frying in saturated fat. Add moisture to lean meats by using water, broth, cooking wines, and other nonfat or low-fat sauces along with the cooking methods mentioned above. Soups and stews should be chilled after cooking. The fat that forms on top after a few hours in the refrigerator should be skimmed off. When preparing meals, avoid using excess salt. Salt can contribute to raising blood pressure in some people.  EATING AWAY FROM HOME Order entres, potatoes, and vegetables without sauces or butter. When meat exceeds the size of a deck of cards (3 to 4 ounces), the rest can be taken home for another meal. Choose vegetable or fruit salads and ask for low-calorie salad dressings to be served on the side. Use dressings sparingly. Limit high-fat toppings, such as bacon, crumbled eggs, cheese, sunflower seeds, and olives. Ask for heart-healthy tub margarine instead of butter.

## 2016-03-29 NOTE — H&P (Signed)
Primary Care Physician:  Eustaquio Maize, MD Primary Gastroenterologist:  Dr. Oneida Alar  Pre-Procedure History & Physical: HPI:  Susan Petty is a 70 y.o. female here for DYSPEPSIA/COLON CANCER SCREENING.  Past Medical History:  Diagnosis Date  . Diabetes mellitus   . GERD (gastroesophageal reflux disease)   . Hypertension   . Osteoporosis     Past Surgical History:  Procedure Laterality Date  . TOTAL HIP ARTHROPLASTY  09/03/2011   Procedure: TOTAL HIP ARTHROPLASTY;  Surgeon: Mauri Pole, MD;  Location: [facility];  Service: Orthopedics;  Laterality: Left;    Prior to Admission medications   Medication Sig Start Date End Date Taking? Authorizing Provider  alendronate (FOSAMAX) 70 MG tablet TAKE ONE TABLET BY MOUTH ONCE A WEEK WITH A FULL GLASS OF WATER ON AN EMPTY STOMACH 01/05/16  Yes Eustaquio Maize, MD  amLODipine (NORVASC) 5 MG tablet Take 1 tablet (5 mg total) by mouth daily. 01/05/16  Yes Eustaquio Maize, MD  aspirin EC 81 MG tablet Take 81 mg by mouth daily.   Yes Historical Provider, MD  benazepril (LOTENSIN) 40 MG tablet Take 1 tablet (40 mg total) by mouth daily. 01/05/16  Yes Eustaquio Maize, MD  Cholecalciferol (VITAMIN D-3 PO) Take 2,000 Units by mouth daily.   Yes Historical Provider, MD  hydrochlorothiazide (HYDRODIURIL) 25 MG tablet Take 1 tablet (25 mg total) by mouth daily. 01/05/16  Yes Eustaquio Maize, MD  insulin NPH Human (HUMULIN N) 100 UNIT/ML injection Inject 25 units each morning and 10 unit each evening 01/05/16  Yes Eustaquio Maize, MD  insulin regular (NOVOLIN R,HUMULIN R) 100 units/mL injection Inject 0.05 mLs (5 Units total) into the skin 3 (three) times daily before meals. Patient taking differently: Inject 5 Units into the skin as needed.  01/05/16  Yes Eustaquio Maize, MD  metFORMIN (GLUCOPHAGE) 500 MG tablet Take 1 tablet (500 mg total) by mouth 2 (two) times daily with a meal. 01/05/16  Yes Eustaquio Maize, MD  metoprolol (LOPRESSOR) 25 MG tablet Take 1  tablet (25 mg total) by mouth 2 (two) times daily. Patient taking differently: Take 12.5 mg by mouth 2 (two) times daily.  01/05/16  Yes Eustaquio Maize, MD  Na Sulfate-K Sulfate-Mg Sulf (SUPREP BOWEL PREP KIT) 17.5-3.13-1.6 GM/180ML SOLN Take 1 kit by mouth as directed. 03/20/16  Yes Danie Binder, MD  omeprazole (PRILOSEC) 40 MG capsule Take 1 capsule (40 mg total) by mouth 2 (two) times daily. 01/26/16  Yes Eustaquio Maize, MD  ACCU-CHEK AVIVA PLUS test strip USE TO CHECK GLUCOSE 4 TIMES DAILY 01/23/16   Eustaquio Maize, MD  cetirizine (ZYRTEC) 10 MG tablet Take 1 tablet (10 mg total) by mouth daily. 08/09/15   Eustaquio Maize, MD  fluticasone (FLONASE) 50 MCG/ACT nasal spray Place 2 sprays into both nostrils daily. 01/05/16   Eustaquio Maize, MD  glucose blood test strip Use as instructed 10/31/14   Chipper Herb, MD    Allergies as of 03/20/2016 - Review Complete 03/20/2016  Allergen Reaction Noted  . Sulfa antibiotics  06/20/2012  . Sulfa drugs cross reactors Rash 09/02/2011    Family History  Problem Relation Age of Onset  . Hypertension Mother   . Hypertension Father   . Diabetes Father   . Hypertension Sister   . Cancer Sister   . Diabetes Brother   . Diabetes Brother   . Colon cancer Neg Hx     Social  History   Social History  . Marital status: Widowed    Spouse name: N/A  . Number of children: N/A  . Years of education: N/A   Occupational History  . Not on file.   Social History Main Topics  . Smoking status: Never Smoker  . Smokeless tobacco: Never Used  . Alcohol use Yes     Comment: occasional wine  . Drug use: No  . Sexual activity: Yes    Birth control/ protection: None   Other Topics Concern  . Not on file   Social History Narrative  . No narrative on file    Review of Systems: See HPI, otherwise negative ROS   Physical Exam: BP (!) 161/61   Pulse 68   Temp 97.9 F (36.6 C) (Oral)   Resp 14   Ht 5' 5.5" (1.664 m)   Wt 140 lb (63.5 kg)    SpO2 98%   BMI 22.94 kg/m  General:   Alert,  pleasant and cooperative in NAD Head:  Normocephalic and atraumatic. Neck:  Supple; Lungs:  Clear throughout to auscultation.    Heart:  Regular rate and rhythm. Abdomen:  Soft, nontender and nondistended. Normal bowel sounds, without guarding, and without rebound.   Neurologic:  Alert and  oriented x4;  grossly normal neurologically.  Impression/Plan:    DYSPEPSIA/screening  PLAN:  EGD/TCSTODAY

## 2016-03-29 NOTE — Assessment & Plan Note (Signed)
Patient with bloating and early satiety, worsening GERD. Possible gastric outlet obstruction versus gastroparesis. Also possibility of H. pylori infection or erosion versus ulceration. Recommend she continue PPI, will add endoscopy onto cardia need colonoscopy. If early satiety and bloating persists despite normal EGD can follow-up with a gastric emptying study at her next visit. Return for follow-up in 3 months.  Proceed with EGD with Dr. Oneida Alar in near future: the risks, benefits, and alternatives have been discussed with the patient in detail. The patient states understanding and desires to proceed.  The patient is not on any anticoagulants, anxiolytics, chronic pain medications, or antidepressants. Conscious sedation should be adequate for her procedure.

## 2016-03-29 NOTE — Assessment & Plan Note (Signed)
Patient with recent bowel habit changes as described in history of present illness. She is never had a colonoscopy before. At this point she is much overdue for colonoscopy and can likely benefit from endoscopic evaluation given her symptoms. We will proceed with colonoscopy at this time. Return for follow-up in 3 months.  Proceed with colonoscopy with Dr. Oneida Alar in the near future. The risks, benefits, and alternatives have been discussed in detail with the patient. They state understanding and desire to proceed.   The patient is not on any anticoagulants, anxiolytics, chronic pain medications, or antidepressants. Conscious sedation should be adequate for her procedure.

## 2016-03-29 NOTE — Op Note (Signed)
[facility] Patient Name: Susan Petty Procedure Date: 03/29/2016 10:17 AM MRN: [id number] Date of Birth: 1946-06-24 Attending MD: Barney Drain , MD CSN: [id number] Age: 70 Admit Type: Outpatient Procedure:                Colonoscopy, SCREENING Indications:              Screening for colorectal malignant neoplasm Providers:                Barney Drain, MD, Rosina Lowenstein, RN, Randa Spike,                            Technician Referring MD:             Assunta Found, MD Medicines:                Meperidine 75 mg IV, Midazolam 5 mg IV Complications:            No immediate complications. Estimated Blood Loss:     Estimated blood loss: none. Procedure:                Pre-Anesthesia Assessment:                           - Prior to the procedure, a History and Physical                            was performed, and patient medications and                            allergies were reviewed. The patient's tolerance of                            previous anesthesia was also reviewed. The risks                            and benefits of the procedure and the sedation                            options and risks were discussed with the patient.                            All questions were answered, and informed consent                            was obtained. Prior Anticoagulants: The patient has                            taken aspirin, last dose was day of procedure. ASA                            Grade Assessment: II - A patient with mild systemic                            disease. After reviewing the risks and benefits,  the patient was deemed in satisfactory condition to                            undergo the procedure. After obtaining informed                            consent, the colonoscope was passed under direct                            vision. Throughout the procedure, the patient's                            blood pressure, pulse, and oxygen  saturations were                            monitored continuously. The [id number]                            scope was introduced through the anus and advanced                            to the 2 cm into the ileum. The terminal ileum,                            ileocecal valve, appendiceal orifice, and rectum                            were photographed. The colonoscopy was somewhat                            difficult due to a tortuous colon. Successful                            completion of the procedure was aided by COLOWRAP.                            The patient tolerated the procedure well. The                            quality of the bowel preparation was excellent. Scope In: 10:50:36 AM Scope Out: 11:02:39 AM Scope Withdrawal Time: 0 hours 9 minutes 15 seconds  Total Procedure Duration: 0 hours 12 minutes 3 seconds  Findings:      The digital rectal exam was normal.      The recto-sigmoid colon and sigmoid colon were moderately redundant. AND       COLOWRAP      The exam was otherwise without abnormality.      Non-bleeding internal hemorrhoids were found. The hemorrhoids were small. Impression:               - Redundant LEFT colon.                           - The examination was otherwise normal.                           -  Non-bleeding internal hemorrhoids. Moderate Sedation:      Moderate (conscious) sedation was administered by the endoscopy nurse       and supervised by the endoscopist. The following parameters were       monitored: oxygen saturation, heart rate, blood pressure, and response       to care. Total physician intraservice time was 44 minutes. Recommendation:           - High fiber diet.                           - Continue present medications.                           - Repeat colonoscopy 10-15 YEARS IF THE BENEFITS                            OUTWEIGH THE RISKS for surveillance.                           - Use Linzess (linaclotide) 145 mcg PO  daily.                           - Patient has a contact number available for                            emergencies. The signs and symptoms of potential                            delayed complications were discussed with the                            patient. Return to normal activities tomorrow.                            Written discharge instructions were provided to the                            patient. Procedure Code(s):        --- Professional ---                           [id number], Colonoscopy, flexible; diagnostic, including                            collection of specimen(s) by brushing or washing,                            when performed (separate procedure)                           99152, Moderate sedation services provided by the                            same physician or other qualified health care  professional performing the diagnostic or                            therapeutic service that the sedation supports,                            requiring the presence of an independent trained                            observer to assist in the monitoring of the                            patient's level of consciousness and physiological                            status; initial 15 minutes of intraservice time,                            patient age 60 years or older                           [phone], Moderate sedation services; each additional                            15 minutes intraservice time                           99153, Moderate sedation services; each additional                            15 minutes intraservice time Diagnosis Code(s):        --- Professional ---                           Z12.11, Encounter for screening for malignant                            neoplasm of colon                           K64.8, Other hemorrhoids                           Q43.8, Other specified congenital malformations of                             intestine CPT copyright 2016 American Medical Association. All rights reserved. The codes documented in this report are preliminary and upon coder review may  be revised to meet current compliance requirements. Barney Drain, MD Barney Drain, MD 03/29/2016 11:39:55 AM This report has been signed electronically. Number of Addenda: 0

## 2016-03-29 NOTE — Assessment & Plan Note (Signed)
Significant and worsening GERD symptoms multiple times a week despite PPI therapy. Recommend she continue PPI, return for follow-up in 3 months.

## 2016-03-29 NOTE — Op Note (Signed)
[facility] Patient Name: Susan Petty Procedure Date: 03/29/2016 11:05 AM MRN: [id number] Date of Birth: [birth year] Attending MD: Barney Drain , MD CSN: [id number] Age: 70 Admit Type: Outpatient Procedure:                Upper GI endoscopy WITH COLD FORCEPS BIOPSY Indications:              Dyspepsia, Heartburn-NEXIUM DOESN'T WORK. SYMPTOMS                            NOT IDEALLY CONTROOLLED WITHOEMPRAZOLE 20 MG BID. Providers:                Barney Drain, MD, Rosina Lowenstein, RN, Randa Spike,                            Technician Referring MD:             Assunta Found, MD Medicines:                Midazolam 1 mg IV Complications:            No immediate complications. Estimated Blood Loss:     Estimated blood loss was minimal. Procedure:                Pre-Anesthesia Assessment:                           - Prior to the procedure, a History and Physical                            was performed, and patient medications and                            allergies were reviewed. The patient's tolerance of                            previous anesthesia was also reviewed. The risks                            and benefits of the procedure and the sedation                            options and risks were discussed with the patient.                            All questions were answered, and informed consent                            was obtained. Prior Anticoagulants: The patient has                            taken aspirin, last dose was day of procedure. ASA                            Grade Assessment: II - A patient with mild systemic  disease. After reviewing the risks and benefits,                            the patient was deemed in satisfactory condition to                            undergo the procedure. After obtaining informed                            consent, the endoscope was passed under direct                            vision. Throughout the  procedure, the patient's                            blood pressure, pulse, and oxygen saturations were                            monitored continuously. The [id number]                            scope was introduced through the mouth, and                            advanced to the second part of duodenum. The upper                            GI endoscopy was accomplished without difficulty.                            The patient tolerated the procedure well. Scope In: 11:08:11 AM Scope Out: 11:17:14 AM Total Procedure Duration: 0 hours 9 minutes 3 seconds  Findings:      A small hiatal hernia was present.      A few small sessile polyps were found in the gastric fundus. The polyp       was removed with a cold biopsy forceps. Resection and retrieval were       complete.      Mild inflammation characterized by congestion (edema) and erythema was       found in the gastric antrum. Biopsies were taken with a cold forceps for       Helicobacter pylori OR EOSINOPHILIC GASTRITIS.      The examined duodenum was normal. Biopsies for histology were taken with       a cold forceps for evaluation of celiac disease OR EOSINOPHILIC GE.      A non-obstructing Schatzki ring (acquired) was found at the       gastroesophageal junction. Impression:               - SCHATZKI'S RING, PATENT                           - Small hiatal hernia.                           - SMALL gastric polyps.                           -  MILD Gastritis.                           - DYSPEPSIA MOST LIKEL DUE TO NERD, GERD                            UNCOLTROLELD DUE TO DIETARY NONADHERENCE, AND LESS                            LIKLEY NON-ULCER DYSPEPSIA Moderate Sedation:      Moderate (conscious) sedation was administered by the endoscopy nurse       and supervised by the endoscopist. The following parameters were       monitored: oxygen saturation, heart rate, blood pressure, and response       to care. Total physician  intraservice time was 44 minutes. Recommendation:           - Low fat diet.                           - Await pathology results.                           - Return to my office in 4 months.                           - Use Prilosec (omeprazole) 20 mg PO BID.                           - Patient has a contact number available for                            emergencies. The signs and symptoms of potential                            delayed complications were discussed with the                            patient. Return to normal activities tomorrow.                            Written discharge instructions were provided to the                            patient.                           - Post procedure medication instructions were                            provided to the patient. Procedure Code(s):        --- Professional ---                           [phone], Esophagogastroduodenoscopy, flexible,  transoral; with biopsy, single or multiple                           99152, Moderate sedation services provided by the                            same physician or other qualified health care                            professional performing the diagnostic or                            therapeutic service that the sedation supports,                            requiring the presence of an independent trained                            observer to assist in the monitoring of the                            patient's level of consciousness and physiological                            status; initial 15 minutes of intraservice time,                            patient age 68 years or older                           [phone], Moderate sedation services; each additional                            15 minutes intraservice time                           99153, Moderate sedation services; each additional                            15 minutes intraservice time Diagnosis Code(s):         --- Professional ---                           K44.9, Diaphragmatic hernia without obstruction or                            gangrene                           K31.7, Polyp of stomach and duodenum                           K29.70, Gastritis, unspecified, without bleeding                           R10.13, Epigastric pain  R12, Heartburn CPT copyright 2016 American Medical Association. All rights reserved. The codes documented in this report are preliminary and upon coder review may  be revised to meet current compliance requirements. Barney Drain, MD Barney Drain, MD 03/29/2016 11:46:09 AM This report has been signed electronically. Number of Addenda: 0

## 2016-04-01 ENCOUNTER — Telehealth: Payer: Self-pay | Admitting: Gastroenterology

## 2016-04-01 NOTE — Telephone Encounter (Signed)
PLEASE CALL PT. SHE HAD BENIGN POLYPS REMOVED FORM HER STOMACH.  HER SMALL BOWEL BIOPSIES ARE NORMAL. YOUR UNCONTROLLED REFLUX IS MOST LIKELY DUE TO EATING TOMATOES.   DRINK WATER TO KEEP YOUR URINE LIGHT YELLOW.  AVOID TOMATOES AND OTHER FOOD AND DRINKS THAT TRIGER REFLUX.   ADD LINZESS EVERY MORNING WITH BREAKFAST.  IT MAY CAUSE EXPLOSIVE DIARRHEA.   CONTINUE OMEPRAZOLE.  TAKE 30 MINUTES PRIOR TO YOUR MEALS TWICE DAILY.  STRICTLY FOLLOW A LOW FAT DIET.  MEATS SHOULD BE CHOPPED OR GROUND ONLY. AVOID FRIED FOODS.   FOLLOW UP IN JAN 2018 E30 GERD/DYSPEPSIA.

## 2016-04-01 NOTE — Progress Notes (Signed)
CC'D TO PCP °

## 2016-04-02 NOTE — Telephone Encounter (Signed)
LMOM to call.

## 2016-04-02 NOTE — Telephone Encounter (Signed)
PT is aware and said the Linzess 72 mcg has not helped. She has not had a good BM since she had her prep for the procedure. Please advise!

## 2016-04-02 NOTE — Telephone Encounter (Signed)
PATIENT SCHEDULED FOR FOLLOW UP 

## 2016-04-02 NOTE — Telephone Encounter (Signed)
PLEASE CALL PT. SHE SHOULD STOP BY AND PICK UP LINZESS 145 MCG PILLS #8 AND TAKE WITH BREAKFAST.

## 2016-04-03 NOTE — Telephone Encounter (Signed)
LMOM that Dr. Oneida Alar has increased the Linzess to 145 mcg and to take one daily with breakfast. Samples at front for pick up.

## 2016-04-08 ENCOUNTER — Encounter (HOSPITAL_COMMUNITY): Payer: Self-pay | Admitting: Gastroenterology

## 2016-04-12 DIAGNOSIS — Z23 Encounter for immunization: Secondary | ICD-10-CM | POA: Diagnosis not present

## 2016-04-22 ENCOUNTER — Ambulatory Visit (INDEPENDENT_AMBULATORY_CARE_PROVIDER_SITE_OTHER): Payer: Medicare Other | Admitting: Pediatrics

## 2016-04-22 ENCOUNTER — Encounter: Payer: Self-pay | Admitting: Pediatrics

## 2016-04-22 VITALS — BP 147/71 | HR 57 | Temp 98.5°F | Ht 65.0 in | Wt 140.8 lb

## 2016-04-22 DIAGNOSIS — K219 Gastro-esophageal reflux disease without esophagitis: Secondary | ICD-10-CM

## 2016-04-22 DIAGNOSIS — I10 Essential (primary) hypertension: Secondary | ICD-10-CM | POA: Diagnosis not present

## 2016-04-22 DIAGNOSIS — Z6823 Body mass index (BMI) 23.0-23.9, adult: Secondary | ICD-10-CM | POA: Diagnosis not present

## 2016-04-22 DIAGNOSIS — E119 Type 2 diabetes mellitus without complications: Secondary | ICD-10-CM

## 2016-04-22 DIAGNOSIS — E785 Hyperlipidemia, unspecified: Secondary | ICD-10-CM | POA: Diagnosis not present

## 2016-04-22 DIAGNOSIS — Z794 Long term (current) use of insulin: Secondary | ICD-10-CM | POA: Diagnosis not present

## 2016-04-22 DIAGNOSIS — J309 Allergic rhinitis, unspecified: Secondary | ICD-10-CM | POA: Diagnosis not present

## 2016-04-22 LAB — BAYER DCA HB A1C WAIVED: HB A1C (BAYER DCA - WAIVED): 7.8 % — ABNORMAL HIGH (ref <7.0)

## 2016-04-22 MED ORDER — METFORMIN HCL 500 MG PO TABS: 1000.0000 mg | ORAL_TABLET | Freq: Two times a day (BID) | ORAL | 3 refills | Status: DC

## 2016-04-22 NOTE — Patient Instructions (Signed)
Take metformin 2 pills morning 2 pills night  Continue 20u of NPH insulin in the morning  If you are having numbers less than 100 let me know, we may need to decrease either your daytime or nighttime NPH insulin  Call to set up your eye doctor appointment  Avoid onions and tomatoes, caffeine for a week to see if helps with reflux.

## 2016-04-22 NOTE — Progress Notes (Signed)
  Subjective:   Patient ID: Susan Petty, female    DOB: 10-May-1946, 70 y.o.   MRN: [id number] CC: Follow-up multiple med problems  HPI: Susan Petty is a 70 y.o. female presenting for Follow-up  GER: ongoing symptoms Eats onions regularly tomatos regularly She thinks the tomatos make it worse Takes omeprazole regularly Recent EGD  DM2: Takes NPH insulin 20u in the morning, 5 at night Takes regular insulin when she needs it with meals Sometimes highs in 300s Not had eye exam yet takingg metformin BID, thinks it helps bring her BGls lower  HTN:  Checks every other day, 110s-120s/ at home Headaches at times, mostly from sinus pressure she thinks No SOB, no CP  Sinus congestion: Taking claritin No fevers No runny nose now No ear pain  Constipation: Improved with linzess  Relevant past medical, surgical, family and social history reviewed. Allergies and medications reviewed and updated. History  Smoking Status  . Never Smoker  Smokeless Tobacco  . Never Used   ROS: Per HPI   Objective:    BP (!) 172/65   Pulse (!) 59   Temp 98.5 F (36.9 C) (Oral)   Ht '5\' 5"'$  (1.651 m)   Wt 140 lb 12.8 oz (63.9 kg)   BMI 23.43 kg/m   Wt Readings from Last 3 Encounters:  04/22/16 140 lb 12.8 oz (63.9 kg)  03/29/16 140 lb (63.5 kg)  03/20/16 142 lb 9.6 oz (64.7 kg)    Gen: NAD, alert, cooperative with exam, NCAT EYES: EOMI, no conjunctival injection, or no icterus ENT:  TMs pearly gray b/l, OP without erythema, no tenderness over sinuses LYMPH: no cervical LAD CV: NRRR, normal S1/S2, no murmur, distal pulses 2+ b/l Resp: CTABL, no wheezes, normal WOB Ext: No edema, warm, normal foot exam Neuro: Alert and oriented MSK: normal muscle bulk  Assessment & Plan:  Susan Petty was seen today for follow-up.  Diagnoses and all orders for this visit:  Essential hypertension Elevated today in clinic, normal at home No symptoms Improved slightly at recheck -      BMP8+EGFR  Type 2 diabetes mellitus with insulin therapy (HCC) Increase metformin to '1000mg'$  BID Again discussed switching to long acting insulin as would have fewer highs and lows Pt does not want to switch right now Will let me know if numbers less than 100 Continue insulin -     Bayer DCA Hb A1c Waived -     Lipid panel -     metFORMIN (GLUCOPHAGE) 500 MG tablet; Take 2 tablets (1,000 mg total) by mouth 2 (two) times daily with a meal.  Gastroesophageal reflux disease, esophagitis presence not specified Discussed foods to avoid, pt has been hesitant to cut out certain foods because she likes them. Discussed trying it for a week to see if improves her symptoms  Body mass index (BMI) of 23.0-23.9 in adult Continue fruits and vegetables, regular walking  Hyperlipidemia, unspecified hyperlipidemia type Cholesterol levels today  Allergic rhinitis, unspecified chronicity, unspecified seasonality, unspecified trigger Cont anti-histamine and flonase  Follow up plan: Return in about 2 months (around 06/22/2016). Assunta Found, MD [place]

## 2016-04-23 LAB — BMP8+EGFR
BUN/Creatinine Ratio: 14 (ref 12–28)
BUN: 11 mg/dL (ref 8–27)
CALCIUM: 9.7 mg/dL (ref 8.7–10.3)
CHLORIDE: 99 mmol/L (ref 96–106)
CO2: 30 mmol/L — AB (ref 18–29)
Creatinine, Ser: 0.81 mg/dL (ref 0.57–1.00)
GFR calc Af Amer: 84 mL/min/{1.73_m2} (ref >59)
GFR, EST NON AFRICAN AMERICAN: 73 mL/min/{1.73_m2} (ref >59)
Glucose: 236 mg/dL — ABNORMAL HIGH (ref 65–99)
POTASSIUM: 4.4 mmol/L (ref 3.5–5.2)
Sodium: 142 mmol/L (ref 134–144)

## 2016-04-23 LAB — LIPID PANEL
CHOL/HDL RATIO: 2.7 ratio (ref 0.0–4.4)
Cholesterol, Total: 205 mg/dL — ABNORMAL HIGH (ref 100–199)
HDL: 75 mg/dL (ref >39)
LDL Calculated: 114 mg/dL — ABNORMAL HIGH (ref 0–99)
TRIGLYCERIDES: 78 mg/dL (ref 0–149)
VLDL CHOLESTEROL CAL: 16 mg/dL (ref 5–40)

## 2016-04-24 ENCOUNTER — Telehealth: Payer: Self-pay

## 2016-04-24 NOTE — Telephone Encounter (Signed)
Pt called and said she was given Linzess 72 mcg after having TCS. She has a rash and her stomach hurts. Stated the [place] did help and she hates to stop taking it but wants to know if something else can be sent to [facility] in [place].

## 2016-04-25 ENCOUNTER — Telehealth: Payer: Self-pay | Admitting: Pediatrics

## 2016-04-25 MED ORDER — PRAVASTATIN SODIUM 40 MG PO TABS: 40.0000 mg | ORAL_TABLET | Freq: Every day | ORAL | 1 refills | Status: DC

## 2016-04-25 NOTE — Telephone Encounter (Signed)
PLEASE CALL PT. She should stop the Linzess.SHE CAN TRY AMITIZA 8 MCG QHS FOR 7 DAYS THEN BID. IT MAY CAUSE NAUSEA. SHE SHOULD STOP BY [facility] TO PICK UP SAMPLES. AND IF IT WORKS I WILL SEND IN A RX.

## 2016-04-25 NOTE — Telephone Encounter (Signed)
Reviewed labs with pt and rx sent into pharmacy for Pravastatin.

## 2016-04-25 NOTE — Telephone Encounter (Signed)
Pt is aware and she will come next week to pick up samples

## 2016-04-30 ENCOUNTER — Encounter: Payer: Self-pay | Admitting: Pediatrics

## 2016-04-30 ENCOUNTER — Ambulatory Visit (INDEPENDENT_AMBULATORY_CARE_PROVIDER_SITE_OTHER): Payer: Medicare Other | Admitting: Pediatrics

## 2016-04-30 VITALS — BP 149/63 | HR 56 | Temp 98.4°F | Ht 65.0 in | Wt 140.0 lb

## 2016-04-30 DIAGNOSIS — L309 Dermatitis, unspecified: Secondary | ICD-10-CM | POA: Diagnosis not present

## 2016-04-30 DIAGNOSIS — H65192 Other acute nonsuppurative otitis media, left ear: Secondary | ICD-10-CM

## 2016-04-30 DIAGNOSIS — H65112 Acute and subacute allergic otitis media (mucoid) (sanguinous) (serous), left ear: Secondary | ICD-10-CM | POA: Diagnosis not present

## 2016-04-30 DIAGNOSIS — I10 Essential (primary) hypertension: Secondary | ICD-10-CM

## 2016-04-30 DIAGNOSIS — E119 Type 2 diabetes mellitus without complications: Secondary | ICD-10-CM

## 2016-04-30 DIAGNOSIS — Z794 Long term (current) use of insulin: Secondary | ICD-10-CM

## 2016-04-30 MED ORDER — AZITHROMYCIN 250 MG PO TABS: ORAL_TABLET | ORAL | 0 refills | Status: DC

## 2016-04-30 MED ORDER — TRIAMCINOLONE ACETONIDE 0.025 % EX OINT: 1.0000 "application " | TOPICAL_OINTMENT | Freq: Two times a day (BID) | CUTANEOUS | 0 refills | Status: DC

## 2016-04-30 NOTE — Progress Notes (Signed)
  Subjective:   Patient ID: Susan Petty, female    DOB: September 29, 1945, 70 y.o.   MRN: [id number] CC: Rash (Buttocks)  HPI: Susan Petty is a 70 y.o. female presenting for Rash (Buttocks)  Started having rash a couple weeks ago in center of back over spine Itches a lot Tried epsom salt on it, helped minimally Noticed a lot of dead skin cell sin area at one point Denies any regular irritation of skin on  Continues to have sinus symptoms No ear pain No headaches Has been doing flonase and antihistamine regularly Helps some No fevers  DM2: no lows Taking insulin daily  HTN: always elevated in clinic No chest pain  HLD: started on pravastatin after last visit, no symptoms, doing well on the medication  Relevant past medical, surgical, family and social history reviewed. Allergies and medications reviewed and updated. History  Smoking Status  . Never Smoker  Smokeless Tobacco  . Never Used   ROS: Per HPI   Objective:    BP (!) 149/63   Pulse (!) 56   Temp 98.4 F (36.9 C) (Oral)   Ht 5\' 5"  (1.651 m)   Wt 140 lb (63.5 kg)   BMI 23.30 kg/m   Wt Readings from Last 3 Encounters:  04/30/16 140 lb (63.5 kg)  04/22/16 140 lb 12.8 oz (63.9 kg)  03/29/16 140 lb (63.5 kg)    Gen: NAD, alert, cooperative with exam, NCAT EYES: EOMI, no conjunctival injection, or no icterus ENT:  R TM with white fluid behind it, L TM pearly gray,  OP without erythema CV: NRRR, normal S1/S2 Resp: CTABL, no wheezes, normal WOB Abd: +BS, soft, NTND. Ext: No edema, warm Neuro: Alert and oriented Skin: apprx 15cm by 5 cm of variably red slightly raised waxy feeling plaque over lower thoracic/upper lumbar spine over bony prominences. Some excoriations present  Assessment & Plan:  Dereona was seen today for rash.  Diagnoses and all orders for this visit:  Essential hypertension Improved from last visit Always elevated in doctors office Cont to check at home, let me know if >140, >90  regularly  Type 2 diabetes mellitus with insulin therapy ([place]) Cont insulin, repeat A1c due in Jan  Dermatitis Irritated skin over spiny processes lower back Discussed adequate moisturizing, use topical steroid while still itching Let me know if not improving Avoid clothes, activities that irritate area until improved -     triamcinolone (KENALOG) 0.025 % ointment; Apply 1 application topically 2 (two) times daily.  Acute mucoid otitis media of left ear Start abx if starts to have ear pain R side -     azithromycin (ZITHROMAX) 250 MG tablet; Take 2 the first day and then one each day after.   Follow up plan: Return in about 3 months (around 07/31/2016). Assunta Found, MD [place]

## 2016-04-30 NOTE — Patient Instructions (Addendum)
Use steroid cream on area on back that is itching twice a day Use gentle moisturizer without fragrance such as eucerin or cerave on area twice a day after the steroid  Bring blood pressure readings to next clinic visit Let me know if top number at home regularly over 140 or bottom number over 90  Start antibiotic if you start to have Right ear pain

## 2016-05-02 ENCOUNTER — Telehealth: Payer: Self-pay | Admitting: Pediatrics

## 2016-05-02 NOTE — Telephone Encounter (Signed)
Patient aware that rx was sent to pharmacy yesterday.

## 2016-05-15 ENCOUNTER — Telehealth: Payer: Self-pay | Admitting: Gastroenterology

## 2016-05-15 NOTE — Telephone Encounter (Signed)
Pt is out of Amitiza samples and needs a prescription called into [facility] in [place].

## 2016-05-16 MED ORDER — LUBIPROSTONE 8 MCG PO CAPS: 8.0000 ug | ORAL_CAPSULE | Freq: Two times a day (BID) | ORAL | 3 refills | Status: DC

## 2016-05-16 NOTE — Telephone Encounter (Signed)
Forwarding to refill box. ( See note 04/24/2016).

## 2016-05-16 NOTE — Telephone Encounter (Signed)
Done

## 2016-05-16 NOTE — Addendum Note (Signed)
Addended by: Annitta Needs on: 05/16/2016 11:43 AM   Modules accepted: Orders

## 2016-06-19 ENCOUNTER — Telehealth: Payer: Self-pay

## 2016-06-19 ENCOUNTER — Encounter: Payer: Self-pay | Admitting: Gastroenterology

## 2016-06-19 ENCOUNTER — Ambulatory Visit (INDEPENDENT_AMBULATORY_CARE_PROVIDER_SITE_OTHER): Payer: Medicare Other | Admitting: Nurse Practitioner

## 2016-06-19 ENCOUNTER — Encounter: Payer: Self-pay | Admitting: Nurse Practitioner

## 2016-06-19 VITALS — BP 147/59 | HR 61 | Temp 97.3°F | Ht 65.0 in | Wt 140.8 lb

## 2016-06-19 DIAGNOSIS — H40033 Anatomical narrow angle, bilateral: Secondary | ICD-10-CM | POA: Diagnosis not present

## 2016-06-19 DIAGNOSIS — R140 Abdominal distension (gaseous): Secondary | ICD-10-CM | POA: Diagnosis not present

## 2016-06-19 DIAGNOSIS — K219 Gastro-esophageal reflux disease without esophagitis: Secondary | ICD-10-CM

## 2016-06-19 DIAGNOSIS — K5900 Constipation, unspecified: Secondary | ICD-10-CM

## 2016-06-19 DIAGNOSIS — H04123 Dry eye syndrome of bilateral lacrimal glands: Secondary | ICD-10-CM | POA: Diagnosis not present

## 2016-06-19 NOTE — Progress Notes (Signed)
CC'ED TO PCP 

## 2016-06-19 NOTE — Patient Instructions (Signed)
1. We will help get your stomach emptying test scheduled. 2. Continue your current medications. 3. Continue to try to avoid foods that make your symptoms worse. 4. Try to eat several small meals a day rather than a large, full meal. 5. Return for follow-up in 3 months.

## 2016-06-19 NOTE — Progress Notes (Signed)
Referring Provider: Eustaquio Maize, MD Primary Care Physician:  Eustaquio Maize, MD Primary GI:  Dr. Oneida Alar  Chief Complaint  Patient presents with  . Gastroesophageal Reflux    HPI:   Susan Petty is a 70 y.o. female who presents for follow-up on GERD. The patient was last seen in our office on 03/20/2016. At that time noted history of hiatal hernia, breakthrough GERD symptoms despite omeprazole twice daily. A lot of constipation and flatulence, needs laxatives to go which is been the case for the previous couple months and noted as a change in bowel habits. Had never had a colonoscopy previously. Noted bloating as well. Recommended she continue PPI, she was arranged for upper endoscopy and colonoscopy. Recommend consider gastric emptying study at next visit if EGD normal and symptoms persist.  Colonoscopy and endoscopy were completed 03/29/2016. Colonoscopy found redundant left colon, nonbleeding internal hemorrhoids deemed small, otherwise normal. 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 Dyspepsia deemed most likely due to NERD, GERD uncontrolled due to dietary nonadherence. Surgical pathology as per below. Recommended continue Prilosec 20 mg twice daily, four-month follow-up visit, repeat colonoscopy in 10 years if the benefits outweigh the risks. Linzess was increased to 145 g daily with breakfast.  Today she states she's doing ok overall. Still with bloating, has a bowel movement daily but sensation of incomplete emptying. Is concerned about weight gain. When asked about dietary avoidance states "yes and no, as much as I can." She states she thinks her refills aren't giving her enough medicine. I counted the pills and she should have enough to last until refills  are due. GERD symptoms are improved. Denies abdominal pain, N/V. Does have early satiety associated with her bloating. Currently on omeprazole bid and Amitiza. Denies chest pain, dyspnea, dizziness, lightheadedness, syncope, near syncope. Denies any other upper or lower GI symptoms.     Past Medical History:  Diagnosis Date  . Diabetes mellitus   . GERD (gastroesophageal reflux disease)   . Hypertension   . Osteoporosis     Past Surgical History:  Procedure Laterality Date  . COLONOSCOPY N/A 03/29/2016   Procedure: COLONOSCOPY;  Surgeon: Danie Binder, MD;  Location: [facility];  Service: Endoscopy;  Laterality: N/A;  10:00 am  . ESOPHAGOGASTRODUODENOSCOPY N/A 03/29/2016   Procedure: ESOPHAGOGASTRODUODENOSCOPY (EGD);  Surgeon: Danie Binder, MD;  Location: [facility];  Service: Endoscopy;  Laterality: N/A;  . TOTAL HIP ARTHROPLASTY  09/03/2011   Procedure: TOTAL HIP ARTHROPLASTY;  Surgeon: Mauri Pole, MD;  Location: [facility];  Service: Orthopedics;  Laterality: Left;    Current Outpatient Prescriptions  Medication Sig Dispense Refill  . ACCU-CHEK AVIVA PLUS test strip USE TO CHECK GLUCOSE 4 TIMES DAILY 120 each 11  . alendronate (FOSAMAX) 70 MG tablet TAKE ONE TABLET BY MOUTH ONCE A WEEK WITH A FULL GLASS OF WATER ON AN EMPTY STOMACH 4 tablet 2  . amLODipine (NORVASC) 5 MG tablet Take 1 tablet (5 mg total) by mouth daily. 30 tablet 4  . benazepril (LOTENSIN) 40 MG tablet Take 1 tablet (40 mg total) by mouth daily. 30 tablet 4  . cetirizine (ZYRTEC) 10 MG tablet Take 1 tablet (10 mg total) by mouth daily. 30 tablet 11  . Cholecalciferol (VITAMIN D-3 PO) Take  2,000 Units by mouth daily.    . fluticasone (FLONASE) 50 MCG/ACT nasal spray Place 2 sprays into both nostrils daily. 16 g 6  . glucose blood test strip Use as instructed 100 each 2  . hydrochlorothiazide (HYDRODIURIL) 25 MG tablet Take 1 tablet (25 mg total) by mouth daily. 30 tablet 4  . insulin NPH Human (HUMULIN N)  100 UNIT/ML injection Inject 25 units each morning and 10 unit each evening 30 mL 1  . insulin regular (NOVOLIN R,HUMULIN R) 100 units/mL injection Inject 0.05 mLs (5 Units total) into the skin 3 (three) times daily before meals. (Patient taking differently: Inject 5 Units into the skin as needed. ) 10 mL 5  . lubiprostone (AMITIZA) 8 MCG capsule Take 1 capsule (8 mcg total) by mouth 2 (two) times daily with a meal. 60 capsule 3  . metFORMIN (GLUCOPHAGE) 500 MG tablet Take 2 tablets (1,000 mg total) by mouth 2 (two) times daily with a meal. 120 tablet 3  . metoprolol (LOPRESSOR) 25 MG tablet Take 1 tablet (25 mg total) by mouth 2 (two) times daily. (Patient taking differently: Take 12.5 mg by mouth 2 (two) times daily. ) 60 tablet 3  . omeprazole (PRILOSEC) 40 MG capsule 1 PO 30 MINS PRIOR TO BREAKFAST AND SUPPER 60 capsule 11  . pravastatin (PRAVACHOL) 40 MG tablet Take 1 tablet (40 mg total) by mouth daily. 90 tablet 1  . triamcinolone (KENALOG) 0.025 % ointment Apply 1 application topically 2 (two) times daily. 30 g 0  . aspirin EC 81 MG tablet Take 81 mg by mouth daily.     No current facility-administered medications for this visit.     Allergies as of 06/19/2016 - Review Complete 06/19/2016  Allergen Reaction Noted  . Sulfa antibiotics  06/20/2012  . Sulfa drugs cross reactors Rash 09/02/2011    Family History  Problem Relation Age of Onset  . Hypertension Mother   . Hypertension Father   . Diabetes Father   . Hypertension Sister   . Cancer Sister   . Diabetes Brother   . Diabetes Brother   . Colon cancer Neg Hx     Social History   Social History  . Marital status: Widowed    Spouse name: N/A  . Number of children: N/A  . Years of education: N/A   Social History Main Topics  . Smoking status: Never Smoker  . Smokeless tobacco: Never Used  . Alcohol use Yes     Comment: occasional wine  . Drug use: No  . Sexual activity: Yes    Birth control/ protection: None    Other Topics Concern  . None   Social History Narrative  . None    Review of Systems: Complete ROS negative except as per HPI.   Physical Exam: BP (!) 147/59   Pulse 61   Temp 97.3 F (36.3 C) (Oral)   Ht 5\' 5"  (1.651 m)   Wt 140 lb 12.8 oz (63.9 kg)   BMI 23.43 kg/m  General:   Alert and oriented. Pleasant and cooperative. Well-nourished and well-developed.  Ears:  Normal auditory acuity. Cardiovascular:  S1, S2 present without murmurs appreciated. Extremities without clubbing or edema. Respiratory:  Clear to auscultation bilaterally. No wheezes, rales, or rhonchi. No distress.  Gastrointestinal:  +BS, soft, non-tender and non-distended. No HSM noted. No guarding or rebound. No masses appreciated.  Rectal:  Deferred  Musculoskalatal:  Symmetrical without gross deformities. Neurologic:  Alert and oriented x4;  grossly normal  neurologically. Psych:  Alert and cooperative. Normal mood and affect. Heme/Lymph/Immune: No excessive bruising noted.    06/19/2016 9:21 AM   Disclaimer: This note was dictated with voice recognition software. Similar sounding words can inadvertently be transcribed and may not be corrected upon review.

## 2016-06-19 NOTE — Telephone Encounter (Signed)
Pt left before I could give her the AVS. I called and LMOM for her to call and that I am mailing the AVS but she needs to call to schedule the GES.  Per Tretha Sciara, pt is scheduled for the GES on Tues 06/25/2016 at 8:00 Am and will need to be at [facility] at 7:45 to register and no stomach meds for 24 hours prior to test.

## 2016-06-19 NOTE — Assessment & Plan Note (Signed)
The patient improved on Amitiza. Has regular bowel movements but still has bloating, isn't sure she is emptying completely. Recommend she continue Amitiza as it seems to work the best of all agents she has tried. If she continues with incomplete emptying and consider adding on fiber supplement or MiraLAX as needed after further evaluation of her bloating symptoms. Return for follow-up in 3 months.

## 2016-06-19 NOTE — Telephone Encounter (Signed)
LMOM and informed pt of Gastric Emptying Study scheduled for 06/25/16 at 8:00am, arrive at [facility] at 7:45am. No stomach medication 24 hours before test. Letter also mailed.

## 2016-06-19 NOTE — Assessment & Plan Note (Signed)
GERD symptoms improved. Continues to take omeprazole twice daily. Is avoiding trigger foods "as much as possible" but admittedly continues to eat tomatoes now and then. Reinforced trigger food avoidance, continue PPI. Return for follow-up in 3 months.

## 2016-06-19 NOTE — Assessment & Plan Note (Signed)
Continued bloating and early satiety. States "I can eat half a sandwich and it feels like IV in the full course meal." She has diabetic. At this point I'll proceed with a gastric emptying study to check for gastroparesis as an etiology of bloating. Return for follow-up in 3 months.

## 2016-06-24 ENCOUNTER — Telehealth: Payer: Self-pay

## 2016-06-24 NOTE — Telephone Encounter (Signed)
Pt called and left message that she was at the beach and she wants to wait until after Christmas to have any test done.

## 2016-06-25 ENCOUNTER — Encounter (HOSPITAL_COMMUNITY): Payer: Medicare Other

## 2016-06-25 NOTE — Telephone Encounter (Signed)
Noted  

## 2016-06-29 NOTE — Progress Notes (Signed)
REVIEWED-NO ADDITIONAL RECOMMENDATIONS. 

## 2016-07-02 ENCOUNTER — Other Ambulatory Visit: Payer: Self-pay | Admitting: Pediatrics

## 2016-07-02 DIAGNOSIS — I10 Essential (primary) hypertension: Secondary | ICD-10-CM

## 2016-07-02 DIAGNOSIS — Z794 Long term (current) use of insulin: Secondary | ICD-10-CM

## 2016-07-02 DIAGNOSIS — E119 Type 2 diabetes mellitus without complications: Secondary | ICD-10-CM

## 2016-07-16 ENCOUNTER — Encounter: Payer: Self-pay | Admitting: Physician Assistant

## 2016-07-16 ENCOUNTER — Ambulatory Visit (INDEPENDENT_AMBULATORY_CARE_PROVIDER_SITE_OTHER): Payer: Medicare Other | Admitting: Physician Assistant

## 2016-07-16 VITALS — BP 141/70 | HR 62 | Temp 98.1°F | Ht 65.0 in | Wt 141.0 lb

## 2016-07-16 DIAGNOSIS — J0100 Acute maxillary sinusitis, unspecified: Secondary | ICD-10-CM

## 2016-07-16 MED ORDER — AMOXICILLIN 500 MG PO CAPS: 1000.0000 mg | ORAL_CAPSULE | Freq: Two times a day (BID) | ORAL | 0 refills | Status: DC

## 2016-07-16 NOTE — Patient Instructions (Signed)

## 2016-07-16 NOTE — Progress Notes (Signed)
BP (!) 141/70   Pulse 62   Temp 98.1 F (36.7 C) (Oral)   Ht 5\' 5"  (1.651 m)   Wt 141 lb (64 kg)   BMI 23.46 kg/m    Subjective:    Patient ID: Susan Petty, female    DOB: 11-21-1945, 71 y.o.   MRN: [id number]  HPI: Susan Petty is a 71 y.o. female presenting on 07/16/2016 for Facial Pain; Sore Throat; and Ear Pain (right )  This patient has had many days of sinus headache and postnasal drainage. There is copious drainage at times. Denies any fever at this time. There has been a history of sinus infections in the past.  No history of sinus surgery. There is cough at night. It has become more prevalent in recent days.  Relevant past medical, surgical, family and social history reviewed and updated as indicated. Allergies and medications reviewed and updated.  Past Medical History:  Diagnosis Date  . Diabetes mellitus   . GERD (gastroesophageal reflux disease)   . Hypertension   . Osteoporosis     Past Surgical History:  Procedure Laterality Date  . COLONOSCOPY N/A 03/29/2016   Procedure: COLONOSCOPY;  Surgeon: Danie Binder, MD;  [facility];  Service: Endoscopy;  Laterality: N/A;  10:00 am  . ESOPHAGOGASTRODUODENOSCOPY N/A 03/29/2016   Procedure: ESOPHAGOGASTRODUODENOSCOPY (EGD);  Surgeon: Danie Binder, MD;  Location: [facility];  Service: Endoscopy;  Laterality: N/A;  . TOTAL HIP ARTHROPLASTY  09/03/2011   Procedure: TOTAL HIP ARTHROPLASTY;  Surgeon: Mauri Pole, MD;  Location: [facility];  Service: Orthopedics;  Laterality: Left;    Review of Systems  Constitutional: Positive for chills and fatigue. Negative for activity change and appetite change.  HENT: Positive for congestion, postnasal drip and sore throat.   Eyes: Negative.   Respiratory: Positive for cough and wheezing.   Cardiovascular: Negative.  Negative for chest pain, palpitations and leg swelling.  Gastrointestinal: Negative.   Genitourinary: Negative.   Musculoskeletal: Negative.     Skin: Negative.   Neurological: Positive for headaches.    Allergies as of 07/16/2016      Reactions   Sulfa Antibiotics    Sulfa Drugs Cross Reactors Rash      Medication List       Accurate as of 07/16/16 12:48 PM. Always use your most recent med list.          alendronate 70 MG tablet Commonly known as:  FOSAMAX TAKE ONE TABLET BY MOUTH ONCE A WEEK WITH A FULL GLASS OF WATER ON AN EMPTY STOMACH   amLODipine 5 MG tablet Commonly known as:  NORVASC Take 1 tablet (5 mg total) by mouth daily.   amoxicillin 500 MG capsule Commonly known as:  AMOXIL Take 2 capsules (1,000 mg total) by mouth 2 (two) times daily.   aspirin EC 81 MG tablet Take 81 mg by mouth daily.   benazepril 40 MG tablet Commonly known as:  LOTENSIN TAKE ONE TABLET BY MOUTH ONCE DAILY   cetirizine 10 MG tablet Commonly known as:  ZYRTEC Take 1 tablet (10 mg total) by mouth daily.   fluticasone 50 MCG/ACT nasal spray Commonly known as:  FLONASE Place 2 sprays into both nostrils daily.   glucose blood test strip Use as instructed   ACCU-CHEK AVIVA PLUS test strip Generic drug:  glucose blood USE TO CHECK GLUCOSE 4 TIMES DAILY   HUMULIN N 100 UNIT/ML injection Generic drug:  insulin NPH Human INJECT 25  UNITS EACH MORNING AND 10 UNITS EACH EVENING   hydrochlorothiazide 25 MG tablet Commonly known as:  HYDRODIURIL TAKE ONE TABLET BY MOUTH ONCE DAILY   insulin regular 250 units/2.81mL (100 units/mL) injection Commonly known as:  NOVOLIN R,HUMULIN R Inject 0.05 mLs (5 Units total) into the skin 3 (three) times daily before meals.   lubiprostone 8 MCG capsule Commonly known as:  AMITIZA Take 1 capsule (8 mcg total) by mouth 2 (two) times daily with a meal.   metFORMIN 500 MG tablet Commonly known as:  GLUCOPHAGE Take 2 tablets (1,000 mg total) by mouth 2 (two) times daily with a meal.   metoprolol tartrate 25 MG tablet Commonly known as:  LOPRESSOR Take 1 tablet (25 mg total) by mouth 2  (two) times daily.   omeprazole 40 MG capsule Commonly known as:  PRILOSEC 1 PO 30 MINS PRIOR TO BREAKFAST AND SUPPER   pravastatin 40 MG tablet Commonly known as:  PRAVACHOL Take 1 tablet (40 mg total) by mouth daily.   triamcinolone 0.025 % ointment Commonly known as:  KENALOG Apply 1 application topically 2 (two) times daily.   VITAMIN D-3 PO Take 2,000 Units by mouth daily.          Objective:    BP (!) 141/70   Pulse 62   Temp 98.1 F (36.7 C) (Oral)   Ht 5\' 5"  (1.651 m)   Wt 141 lb (64 kg)   BMI 23.46 kg/m   Allergies  Allergen Reactions  . Sulfa Antibiotics   . Sulfa Drugs Cross Reactors Rash    Physical Exam  Constitutional: She is oriented to person, place, and time. She appears well-developed and well-nourished.  HENT:  Head: Normocephalic and atraumatic.  Right Ear: Tympanic membrane and external ear normal. No middle ear effusion.  Left Ear: Tympanic membrane and external ear normal.  No middle ear effusion.  Nose: Mucosal edema and rhinorrhea present. Right sinus exhibits no maxillary sinus tenderness. Left sinus exhibits no maxillary sinus tenderness.  Mouth/Throat: Uvula is midline. Posterior oropharyngeal erythema present.  Eyes: Conjunctivae and EOM are normal. Pupils are equal, round, and reactive to light. Right eye exhibits no discharge. Left eye exhibits no discharge.  Neck: Normal range of motion.  Cardiovascular: Normal rate, regular rhythm and normal heart sounds.   Pulmonary/Chest: Effort normal and breath sounds normal. No respiratory distress. She has no wheezes.  Abdominal: Soft.  Lymphadenopathy:    She has no cervical adenopathy.  Neurological: She is alert and oriented to person, place, and time.  Skin: Skin is warm and dry.  Psychiatric: She has a normal mood and affect.        Assessment & Plan:   1. Acute non-recurrent maxillary sinusitis - amoxicillin (AMOXIL) 500 MG capsule; Take 2 capsules (1,000 mg total) by mouth 2  (two) times daily.  Dispense: 40 capsule; Refill: 0   Continue all other maintenance medications as listed above.  Follow up plan: No Follow-up on file.  No orders of the defined types were placed in this encounter.   Educational handout given for sinusitis  Terald Sleeper PA-C [facility] [address]  [place], [place] [address] [phone]   07/16/2016, 12:48 PM

## 2016-07-30 ENCOUNTER — Other Ambulatory Visit: Payer: Self-pay | Admitting: Pediatrics

## 2016-07-30 DIAGNOSIS — M8000XD Age-related osteoporosis with current pathological fracture, unspecified site, subsequent encounter for fracture with routine healing: Secondary | ICD-10-CM

## 2016-07-30 DIAGNOSIS — I10 Essential (primary) hypertension: Secondary | ICD-10-CM

## 2016-08-23 ENCOUNTER — Encounter: Payer: Self-pay | Admitting: Family Medicine

## 2016-08-23 ENCOUNTER — Ambulatory Visit (INDEPENDENT_AMBULATORY_CARE_PROVIDER_SITE_OTHER): Payer: Medicare Other | Admitting: Family Medicine

## 2016-08-23 VITALS — BP 140/58 | HR 55 | Temp 98.0°F | Ht 65.0 in | Wt 139.0 lb

## 2016-08-23 DIAGNOSIS — J0111 Acute recurrent frontal sinusitis: Secondary | ICD-10-CM

## 2016-08-23 MED ORDER — AZITHROMYCIN 250 MG PO TABS: ORAL_TABLET | ORAL | 0 refills | Status: DC

## 2016-08-23 NOTE — Progress Notes (Signed)
BP (!) 140/58   Pulse (!) 55   Temp 98 F (36.7 C) (Oral)   Ht 5\' 5"  (1.651 m)   Wt 139 lb (63 kg)   BMI 23.13 kg/m    Subjective:    Patient ID: Susan Petty, female    DOB: 1946/06/07, 71 y.o.   MRN: [id number]  HPI: Susan Petty is a 71 y.o. female presenting on 08/23/2016 for No chief complaint on file.   HPI Sinus congestion and headache Patient is coming today because of sinus congestion and infection has been going on for the past week. She gets these recurrently and most recently 2 months ago was treated with amoxicillin. She denies any fevers or chills. She does have postnasal drainage and cough. She denies any shortness of breath or wheezing. She has been trying Flonase and Claritin but they do not seem to be clearing up this time.  Relevant past medical, surgical, family and social history reviewed and updated as indicated. Interim medical history since our last visit reviewed. Allergies and medications reviewed and updated.  Review of Systems  Constitutional: Negative for chills and fever.  HENT: Positive for congestion, postnasal drip, rhinorrhea, sinus pressure and sore throat. Negative for ear discharge, ear pain and sneezing.   Eyes: Negative for pain, redness and visual disturbance.  Respiratory: Positive for cough. Negative for chest tightness and shortness of breath.   Cardiovascular: Negative for chest pain and leg swelling.  Genitourinary: Negative for difficulty urinating and dysuria.  Musculoskeletal: Negative for back pain and gait problem.  Skin: Negative for rash.  Neurological: Negative for light-headedness and headaches.  Psychiatric/Behavioral: Negative for agitation and behavioral problems.  All other systems reviewed and are negative.   Per HPI unless specifically indicated above     Objective:    BP (!) 140/58   Pulse (!) 55   Temp 98 F (36.7 C) (Oral)   Ht 5\' 5"  (1.651 m)   Wt 139 lb (63 kg)   BMI 23.13 kg/m   Wt Readings from  Last 3 Encounters:  08/23/16 139 lb (63 kg)  07/16/16 141 lb (64 kg)  06/19/16 140 lb 12.8 oz (63.9 kg)    Physical Exam  Constitutional: She is oriented to person, place, and time. She appears well-developed and well-nourished. No distress.  HENT:  Right Ear: Tympanic membrane, external ear and ear canal normal.  Left Ear: Tympanic membrane, external ear and ear canal normal.  Nose: Mucosal edema and rhinorrhea present. No epistaxis. Right sinus exhibits no maxillary sinus tenderness and no frontal sinus tenderness. Left sinus exhibits no maxillary sinus tenderness and no frontal sinus tenderness.  Mouth/Throat: Uvula is midline and mucous membranes are normal. Posterior oropharyngeal edema and posterior oropharyngeal erythema present. No oropharyngeal exudate or tonsillar abscesses.  Eyes: Conjunctivae are normal.  Cardiovascular: Normal rate, regular rhythm, normal heart sounds and intact distal pulses.   No murmur heard. Pulmonary/Chest: Effort normal and breath sounds normal. No respiratory distress. She has no wheezes.  Musculoskeletal: Normal range of motion. She exhibits no edema or tenderness.  Neurological: She is alert and oriented to person, place, and time. Coordination normal.  Skin: Skin is warm and dry. No rash noted. She is not diaphoretic.  Psychiatric: She has a normal mood and affect. Her behavior is normal.  Vitals reviewed.     Assessment & Plan:   Problem List Items Addressed This Visit    None    Visit Diagnoses    Acute recurrent  frontal sinusitis    -  Primary   Relevant Medications   azithromycin (ZITHROMAX) 250 MG tablet       Follow up plan: No Follow-up on file.  Counseling provided for all of the vaccine components No orders of the defined types were placed in this encounter.   Caryl Pina, MD [facility] 08/23/2016, 3:30 PM

## 2016-09-11 ENCOUNTER — Telehealth: Payer: Self-pay | Admitting: Pediatrics

## 2016-09-11 MED ORDER — FLUCONAZOLE 150 MG PO TABS: 150.0000 mg | ORAL_TABLET | Freq: Once | ORAL | 0 refills | Status: AC

## 2016-09-11 NOTE — Telephone Encounter (Signed)
Yes please go ahead and send in Diflucan 150 mg 1 for her, no refill

## 2016-09-11 NOTE — Telephone Encounter (Signed)
Please advise 

## 2016-09-11 NOTE — Telephone Encounter (Signed)
Aware. 

## 2016-09-11 NOTE — Telephone Encounter (Signed)
What is the name of the medication? Susan Petty. Was supposed to be called in with her zpack when she saw dr d.  Leave a message when done.  Have you contacted your pharmacy to request a refill? no  Which pharmacy would you like this sent to? [facility] in [place].   Patient notified that their request is being sent to the clinical staff for review and that they should receive a call once it is complete. If they do not receive a call within 24 hours they can check with their pharmacy or our office.

## 2016-09-17 ENCOUNTER — Ambulatory Visit: Payer: Medicare Other | Admitting: Nurse Practitioner

## 2016-09-23 DIAGNOSIS — H04123 Dry eye syndrome of bilateral lacrimal glands: Secondary | ICD-10-CM | POA: Diagnosis not present

## 2016-09-26 ENCOUNTER — Other Ambulatory Visit: Payer: Self-pay | Admitting: Gastroenterology

## 2016-09-26 ENCOUNTER — Other Ambulatory Visit: Payer: Self-pay | Admitting: Pediatrics

## 2016-09-26 DIAGNOSIS — Z794 Long term (current) use of insulin: Secondary | ICD-10-CM

## 2016-09-26 DIAGNOSIS — E119 Type 2 diabetes mellitus without complications: Secondary | ICD-10-CM

## 2016-09-26 DIAGNOSIS — I10 Essential (primary) hypertension: Secondary | ICD-10-CM

## 2016-09-30 ENCOUNTER — Other Ambulatory Visit: Payer: Self-pay | Admitting: Pediatrics

## 2016-09-30 DIAGNOSIS — I10 Essential (primary) hypertension: Secondary | ICD-10-CM

## 2016-10-01 ENCOUNTER — Other Ambulatory Visit: Payer: Self-pay | Admitting: Pediatrics

## 2016-10-01 DIAGNOSIS — I10 Essential (primary) hypertension: Secondary | ICD-10-CM

## 2016-10-02 ENCOUNTER — Ambulatory Visit (INDEPENDENT_AMBULATORY_CARE_PROVIDER_SITE_OTHER): Payer: Medicare Other | Admitting: Nurse Practitioner

## 2016-10-02 ENCOUNTER — Encounter: Payer: Self-pay | Admitting: Nurse Practitioner

## 2016-10-02 VITALS — BP 138/55 | HR 61 | Temp 98.5°F | Ht 65.0 in | Wt 138.4 lb

## 2016-10-02 DIAGNOSIS — K219 Gastro-esophageal reflux disease without esophagitis: Secondary | ICD-10-CM | POA: Diagnosis not present

## 2016-10-02 DIAGNOSIS — K5900 Constipation, unspecified: Secondary | ICD-10-CM | POA: Diagnosis not present

## 2016-10-02 MED ORDER — PANTOPRAZOLE SODIUM 40 MG PO TBEC: 40.0000 mg | DELAYED_RELEASE_TABLET | Freq: Every day | ORAL | 3 refills | Status: DC

## 2016-10-02 NOTE — Progress Notes (Signed)
Referring Provider: Eustaquio Maize, MD Primary Care Physician:  Eustaquio Maize, MD Primary GI:  Dr. Oneida Alar  Chief Complaint  Patient presents with  . Gastroesophageal Reflux    HPI:   Susan Petty is a 71 y.o. female who presents for follow-up on GERD, constipation, bloating. The patient was last seen in our office 06/19/2016 for the same. Colonoscopy and endoscopy on file and up-to-date in 2017. Dyspepsia likely due to GERD and NERD uncontrolled due to dietary nonadherence. At her last visit she was doing well, still with bloating and having a bowel movement every day but sensation of incomplete emptying. Intermittently adherent to dietary avoidance measures. GERD symptoms improved. Was on omeprazole on Amitiza at that time. Recommended gastric emptying study, continue current medications, dietary avoidance, eat small meals rather than large meals, return for follow-up in 3 months.  It does not appear gastric emptying test was completed.  Today she states her GERD isn't well controlled. When asked about if she's following diet recommendations she states she doesn't eat tomatoes which is the biggest thing. Has persistent symptoms most days. Worse at night. Is currently on omeprazole 40 mg bid. Has not been on pantoprazole, lansoprazole, or esomeprazole as an outpatient before. Denies abdominal pain, having regular/better bowel movements on Amitiza/no more constipation. She takes this twice a day. Denies N/V, hematochezia, melena, unintentional weight loss, fever, chills. Denies chest pain, dyspnea, dizziness, lightheadedness, syncope, near syncope. Denies any other upper or lower GI symptoms.  Past Medical History:  Diagnosis Date  . Diabetes mellitus   . GERD (gastroesophageal reflux disease)   . Hypertension   . Osteoporosis     Past Surgical History:  Procedure Laterality Date  . COLONOSCOPY N/A 03/29/2016   Procedure: COLONOSCOPY;  Surgeon: Danie Binder, MD;  Location: [facility];  Service: Endoscopy;  Laterality: N/A;  10:00 am  . ESOPHAGOGASTRODUODENOSCOPY N/A 03/29/2016   Procedure: ESOPHAGOGASTRODUODENOSCOPY (EGD);  Surgeon: Danie Binder, MD;  Location: [facility];  Service: Endoscopy;  Laterality: N/A;  . TOTAL HIP ARTHROPLASTY  09/03/2011   Procedure: TOTAL HIP ARTHROPLASTY;  Surgeon: Mauri Pole, MD;  Location: [facility];  Service: Orthopedics;  Laterality: Left;    Current Outpatient Prescriptions  Medication Sig Dispense Refill  . ACCU-CHEK AVIVA PLUS test strip USE TO CHECK GLUCOSE 4 TIMES DAILY 120 each 11  . alendronate (FOSAMAX) 70 MG tablet TAKE ONE TABLET BY MOUTH ONCE A WEEK WITH A FULL GLASS OF WATER ON AN EMPTY STOMACH 4 tablet 2  . AMITIZA 8 MCG capsule TAKE ONE CAPSULE BY MOUTH TWICE DAILY WITH A MEAL 60 capsule 11  . amLODipine (NORVASC) 5 MG tablet TAKE ONE TABLET BY MOUTH ONCE DAILY 90 tablet 0  . aspirin EC 81 MG tablet Take 81 mg by mouth daily.    . benazepril (LOTENSIN) 40 MG tablet TAKE ONE TABLET BY MOUTH ONCE DAILY 90 tablet 0  . cetirizine (ZYRTEC) 10 MG tablet Take 1 tablet (10 mg total) by mouth daily. 30 tablet 11  . Cholecalciferol (VITAMIN D-3 PO) Take 2,000 Units by mouth daily.    . fluticasone (FLONASE) 50 MCG/ACT nasal spray Place 2 sprays into both nostrils daily. (Patient taking differently: Place 2 sprays into both nostrils as needed. ) 16 g 6  . glucose blood test strip Use as instructed 100 each 2  . HUMULIN N 100 UNIT/ML injection INJECT 25 UNITS EACH MORNING AND 10 UNITS EACH EVENING 30 mL 1  .  hydrochlorothiazide (HYDRODIURIL) 25 MG tablet TAKE ONE TABLET BY MOUTH ONCE DAILY 90 tablet 0  . insulin regular (NOVOLIN R,HUMULIN R) 100 units/mL injection Inject 0.05 mLs (5 Units total) into the skin 3 (three) times daily before meals. (Patient taking differently: Inject 5 Units into the skin as needed. ) 10 mL 5  . metFORMIN (GLUCOPHAGE) 500 MG tablet TAKE TWO TABLETS BY MOUTH TWICE DAILY WITH MEALS 360 tablet 0    . metoprolol (LOPRESSOR) 25 MG tablet Take 1 tablet (25 mg total) by mouth 2 (two) times daily. (Patient taking differently: Take 12.5 mg by mouth 2 (two) times daily. ) 60 tablet 3  . omeprazole (PRILOSEC) 40 MG capsule 1 PO 30 MINS PRIOR TO BREAKFAST AND SUPPER 60 capsule 11  . pravastatin (PRAVACHOL) 40 MG tablet TAKE ONE TABLET BY MOUTH ONCE DAILY 90 tablet 0   No current facility-administered medications for this visit.     Allergies as of 10/02/2016 - Review Complete 10/02/2016  Allergen Reaction Noted  . Sulfa antibiotics  06/20/2012  . Sulfa drugs cross reactors Rash 09/02/2011    Family History  Problem Relation Age of Onset  . Hypertension Mother   . Hypertension Father   . Diabetes Father   . Hypertension Sister   . Cancer Sister   . Diabetes Brother   . Diabetes Brother   . Colon cancer Neg Hx     Social History   Social History  . Marital status: Widowed    Spouse name: N/A  . Number of children: N/A  . Years of education: N/A   Social History Main Topics  . Smoking status: Never Smoker  . Smokeless tobacco: Never Used  . Alcohol use Yes     Comment: occasional wine  . Drug use: No  . Sexual activity: Yes    Birth control/ protection: None   Other Topics Concern  . None   Social History Narrative  . None    Review of Systems: Complete ROS negative except as per HPI.   Physical Exam: BP (!) 138/55   Pulse 61   Temp 98.5 F (36.9 C) (Oral)   Ht 5\' 5"  (1.651 m)   Wt 138 lb 6.4 oz (62.8 kg)   BMI 23.03 kg/m  General:   Alert and oriented. Pleasant and cooperative. Well-nourished and well-developed.  Head:  Normocephalic and atraumatic. Eyes:  Without icterus, sclera clear and conjunctiva pink.  Ears:  Normal auditory acuity. Cardiovascular:  S1, S2 present without murmurs appreciated. Extremities without clubbing or edema. Respiratory:  Clear to auscultation bilaterally. No wheezes, rales, or rhonchi. No distress.  Gastrointestinal:  +BS,  soft, non-tender and non-distended. No HSM noted. No guarding or rebound. No masses appreciated.  Rectal:  Deferred  Musculoskalatal:  Symmetrical without gross deformities. Neurologic:  Alert and oriented x4;  grossly normal neurologically. Psych:  Alert and cooperative. Normal mood and affect. Heme/Lymph/Immune: No excessive bruising noted.    10/02/2016 2:20 PM   Disclaimer: This note was dictated with voice recognition software. Similar sounding words can inadvertently be transcribed and may not be corrected upon review.

## 2016-10-02 NOTE — Patient Instructions (Addendum)
1. Stop omeprazole. 2. I send in a prescription for Protonix 40 mg to your pharmacy. Take this once a day, first thing in the morning. 3. Call us in 1-2 weeks and let us know if it is helping. 4. We can adjust your medications prior to next visit. 5. Again we stress avoiding trigger foods that worsen her symptoms. 6. Continue Amitiza. 7. Return for follow-up in 3 months.    Gastroesophageal Reflux Scan A gastroesophageal reflux scan is a procedure that is used to check for gastroesophageal reflux, which is the backward flow of stomach contents into the tube that carries food from the mouth to the stomach (esophagus). The scan can also show if any stomach contents are inhaled (aspirated) into your lungs. You may need this scan if you have symptoms such as heartburn, vomiting, swallowing problems, or regurgitation. Regurgitation means that swallowed food is returning from the stomach to the esophagus. For this scan, you will drink a liquid that contains a small amount of a radioactive substance (tracer). A scanner with a camera that detects the radioactive tracer is used to see if any of the material backs up into your esophagus. Tell a health care provider about:  Any allergies you have.  All medicines you are taking, including vitamins, herbs, eye drops, creams, and over-the-counter medicines.  Any blood disorders you have.  Any surgeries you have had.  Any medical conditions you have.  If you are pregnant or you think that you may be pregnant.  If you are breastfeeding. What are the risks? Generally, this is a safe procedure. However, problems may occur, including:  Exposure to radiation (a small amount).  Allergic reaction to the radioactive substance. This is rare. What happens before the procedure?  Ask your health care provider about changing or stopping your regular medicines. This is especially important if you are taking diabetes medicines or blood thinners.  Follow your  health care provider's instructions about eating or drinking restrictions. What happens during the procedure?  You will be asked to drink a liquid that contains a small amount of a radioactive tracer. This liquid will probably be similar to orange juice.  You will assume a position lying on your back.  A series of images will be taken of your esophagus and upper stomach.  You may be asked to move into different positions to help determine if reflux occurs more often when you are in specific positions.  For adults, an abdominal binder with an inflatable cuff may be placed on the belly (abdomen). This may be used to increase abdominal pressure. More images will be taken to see if the increased pressure causes reflux to occur. The procedure may vary among health care providers and hospitals. What happens after the procedure?  Return to your normal activities and your normal diet as directed by your health care provider.  The radioactive tracer will leave your body over the next few days. Drink enough fluid to keep your urine clear or pale yellow. This will help to flush the tracer out of your body.  It is your responsibility to obtain your test results. Ask your health care provider or the department performing the test when and how you will get your results. This information is not intended to replace advice given to you by your health care provider. Make sure you discuss any questions you have with your health care provider. Document Released: 08/15/2005 Document Revised: 03/18/2016 Document Reviewed: 04/05/2014 Elsevier Interactive Patient Education  2017 [employer].  Food Choices for Gastroesophageal Reflux Disease, Adult When you have gastroesophageal reflux disease (GERD), the foods you eat and your eating habits are very important. Choosing the right foods can help ease the discomfort of GERD. Consider working with a diet and nutrition specialist (dietitian) to help you make  healthy food choices. What general guidelines should I follow? Eating plan   Choose healthy foods low in fat, such as fruits, vegetables, whole grains, low-fat dairy products, and lean meat, fish, and poultry.  Eat frequent, small meals instead of three large meals each day. Eat your meals slowly, in a relaxed setting. Avoid bending over or lying down until 2-3 hours after eating.  Limit high-fat foods such as fatty meats or fried foods.  Limit your intake of oils, butter, and shortening to less than 8 teaspoons each day.  Avoid the following:  Foods that cause symptoms. These may be different for different people. Keep a food diary to keep track of foods that cause symptoms.  Alcohol.  Drinking large amounts of liquid with meals.  Eating meals during the 2-3 hours before bed.  Cook foods using methods other than frying. This may include baking, grilling, or broiling. Lifestyle    Maintain a healthy weight. Ask your health care provider what weight is healthy for you. If you need to lose weight, work with your health care provider to do so safely.  Exercise for at least 30 minutes on 5 or more days each week, or as told by your health care provider.  Avoid wearing clothes that fit tightly around your waist and chest.  Do not use any products that contain nicotine or tobacco, such as cigarettes and e-cigarettes. If you need help quitting, ask your health care provider.  Sleep with the head of your bed raised. Use a wedge under the mattress or blocks under the bed frame to raise the head of the bed. What foods are not recommended? The items listed may not be a complete list. Talk with your dietitian about what dietary choices are best for you. Grains  Pastries or quick breads with added fat. [place] toast. Vegetables  Deep fried vegetables. [place] fries. Any vegetables prepared with added fat. Any vegetables that cause symptoms. For some people this may include tomatoes and  tomato products, chili peppers, onions and garlic, and horseradish. Fruits  Any fruits prepared with added fat. Any fruits that cause symptoms. For some people this may include citrus fruits, such as oranges, grapefruit, pineapple, and lemons. Meats and other protein foods  High-fat meats, such as fatty beef or pork, hot dogs, ribs, ham, sausage, salami and bacon. Fried meat or protein, including fried fish and fried chicken. Nuts and nut butters. Dairy  Whole milk and chocolate milk. Sour cream. Cream. Ice cream. Cream cheese. Milk shakes. Beverages  Coffee and tea, with or without caffeine. Carbonated beverages. Sodas. Energy drinks. Fruit juice made with acidic fruits (such as orange or grapefruit). Tomato juice. Alcoholic drinks. Fats and oils  Butter. Margarine. Shortening. Ghee. Sweets and desserts  Chocolate and cocoa. Donuts. Seasoning and other foods  Pepper. Peppermint and spearmint. Any condiments, herbs, or seasonings that cause symptoms. For some people, this may include curry, hot sauce, or vinegar-based salad dressings. Summary  When you have gastroesophageal reflux disease (GERD), food and lifestyle choices are very important to help ease the discomfort of GERD.  Eat frequent, small meals instead of three large meals each day. Eat your meals slowly, in a relaxed setting. Avoid bending  over or lying down until 2-3 hours after eating.  Limit high-fat foods such as fatty meat or fried foods. This information is not intended to replace advice given to you by your health care provider. Make sure you discuss any questions you have with your health care provider. Document Released: 06/24/2005 Document Revised: 06/25/2016 Document Reviewed: 06/25/2016 Elsevier Interactive Patient Education  2017 [employer].

## 2016-10-02 NOTE — Assessment & Plan Note (Addendum)
Continued GERD symptoms. I've re-stressed dietary avoidance. On omeprazole 40 mg twice a day with continued symptoms. I will change her to Protonix 40 mg once a day with a likely need to increase to twice a day. She is to call us in 1 to 2 weeks with a progress report. Stop omeprazole for now. Return for follow-up in 3 months.

## 2016-10-02 NOTE — Progress Notes (Signed)
cc'ed to pcp °

## 2016-10-02 NOTE — Assessment & Plan Note (Signed)
Constipation improved on Amitiza. Recommend continue Amitiza, return for follow-up in 3 months.

## 2016-10-15 ENCOUNTER — Ambulatory Visit (INDEPENDENT_AMBULATORY_CARE_PROVIDER_SITE_OTHER): Payer: Medicare Other | Admitting: Pediatrics

## 2016-10-15 ENCOUNTER — Encounter: Payer: Self-pay | Admitting: Pediatrics

## 2016-10-15 VITALS — BP 145/67 | HR 62 | Temp 98.4°F | Ht 65.0 in | Wt 138.0 lb

## 2016-10-15 DIAGNOSIS — B379 Candidiasis, unspecified: Secondary | ICD-10-CM

## 2016-10-15 DIAGNOSIS — L298 Other pruritus: Secondary | ICD-10-CM | POA: Diagnosis not present

## 2016-10-15 DIAGNOSIS — N898 Other specified noninflammatory disorders of vagina: Secondary | ICD-10-CM

## 2016-10-15 DIAGNOSIS — N949 Unspecified condition associated with female genital organs and menstrual cycle: Secondary | ICD-10-CM

## 2016-10-15 LAB — URINALYSIS, COMPLETE
Bilirubin, UA: NEGATIVE
Glucose, UA: NEGATIVE
Ketones, UA: NEGATIVE
Nitrite, UA: NEGATIVE
PH UA: 5 (ref 5.0–7.5)
PROTEIN UA: NEGATIVE
RBC, UA: NEGATIVE
Specific Gravity, UA: 1.02 (ref 1.005–1.030)
Urobilinogen, Ur: 0.2 mg/dL (ref 0.2–1.0)

## 2016-10-15 LAB — MICROSCOPIC EXAMINATION
RBC, UA: NONE SEEN /hpf (ref 0–?)
RENAL EPITHEL UA: NONE SEEN /HPF

## 2016-10-15 LAB — WET PREP FOR TRICH, YEAST, CLUE
CLUE CELL EXAM: NEGATIVE
Trichomonas Exam: NEGATIVE
Yeast Exam: POSITIVE — AB

## 2016-10-15 MED ORDER — FLUCONAZOLE 150 MG PO TABS: 150.0000 mg | ORAL_TABLET | ORAL | 0 refills | Status: DC | PRN

## 2016-10-15 NOTE — Progress Notes (Signed)
  Subjective:   Patient ID: Susan Petty, female    DOB: 17-Nov-1945, 71 y.o.   MRN: [id number] CC: Vaginal Burning and Vaginal Itching  HPI: Susan Petty is a 71 y.o. female presenting for Vaginal Burning and Vaginal Itching  Ongoing for a couple of months off and on No vaginal discharge No fever No vaginal bleeding Has not tried anything at home  Taking claritin sometimes, not every day  Relevant past medical, surgical, family and social history reviewed. Allergies and medications reviewed and updated. History  Smoking Status  . Never Smoker  Smokeless Tobacco  . Never Used   ROS: Per HPI   Objective:    BP (!) 145/67   Pulse 62   Temp 98.4 F (36.9 C) (Oral)   Ht 5\' 5"  (1.651 m)   Wt 138 lb (62.6 kg)   BMI 22.96 kg/m   Wt Readings from Last 3 Encounters:  10/15/16 138 lb (62.6 kg)  10/02/16 138 lb 6.4 oz (62.8 kg)  08/23/16 139 lb (63 kg)    Gen: NAD, alert, cooperative with exam, NCAT EYES: EOMI, no conjunctival injection, or no icterus CV: NRRR Resp: CTABL Abd: +BS, soft, NTND. no guarding or organomegaly Ext: No edema, warm Neuro: Alert and oriented  Declines GU exam  Assessment & Plan:  Susan Petty was seen today for vaginal burning and vaginal itching.  Diagnoses and all orders for this visit:  Vaginal burning Likely due to yeast with positive wet prep Treat as below Will f/u urine culture If not improving rtc -     Urinalysis, Complete -     WET PREP FOR TRICH, YEAST, CLUE -     Urine culture  Vaginal itching -     Urinalysis, Complete -     WET PREP FOR TRICH, YEAST, CLUE -     Urine culture  Yeast infection -     fluconazole (DIFLUCAN) 150 MG tablet; Take 1 tablet (150 mg total) by mouth every three (3) days as needed.  Follow up plan: Return in about 2 weeks (around 10/29/2016) for CPE. Assunta Found, MD [place]

## 2016-10-16 LAB — URINE CULTURE

## 2016-10-21 NOTE — Progress Notes (Signed)
REVIEWED-NO ADDITIONAL RECOMMENDATIONS. 

## 2016-10-22 ENCOUNTER — Encounter: Payer: Self-pay | Admitting: Family

## 2016-10-22 ENCOUNTER — Ambulatory Visit (INDEPENDENT_AMBULATORY_CARE_PROVIDER_SITE_OTHER): Payer: Medicare Other | Admitting: Family

## 2016-10-22 VITALS — BP 136/52 | HR 50 | Temp 97.3°F | Ht 65.0 in | Wt 137.6 lb

## 2016-10-22 DIAGNOSIS — N760 Acute vaginitis: Secondary | ICD-10-CM | POA: Diagnosis not present

## 2016-10-22 DIAGNOSIS — N898 Other specified noninflammatory disorders of vagina: Secondary | ICD-10-CM | POA: Diagnosis not present

## 2016-10-22 LAB — WET PREP FOR TRICH, YEAST, CLUE
CLUE CELL EXAM: NEGATIVE
Trichomonas Exam: NEGATIVE
YEAST EXAM: NEGATIVE

## 2016-10-22 MED ORDER — METRONIDAZOLE 0.75 % VA GEL: 1.0000 | Freq: Every day | VAGINAL | 0 refills | Status: DC

## 2016-10-22 NOTE — Progress Notes (Signed)
   Subjective:    Patient ID: Susan Petty, female    DOB: April 07, 1946, 71 y.o.   MRN: [id number]   HPI Pt presents to the office today recheck vaginal yeast infection. PT was treated with diflucan. PT reports her itching improved, but continues to have burning and irration. Denies any discharge.    Review of Systems  All other systems reviewed and are negative.      Objective:   Physical Exam  Constitutional: She is oriented to person, place, and time. She appears well-developed and well-nourished. No distress.  HENT:  Head: Normocephalic.  Cardiovascular: Normal rate, regular rhythm, normal heart sounds and intact distal pulses.   No murmur heard. Pulmonary/Chest: Effort normal and breath sounds normal. No respiratory distress. She has no wheezes.  Abdominal: Soft. Bowel sounds are normal. She exhibits no distension. There is no tenderness.  Genitourinary: Vagina normal.    There is no rash or tenderness on the right labia. There is no rash or tenderness on the left labia.  Musculoskeletal: Normal range of motion. She exhibits no edema or tenderness.  Neurological: She is alert and oriented to person, place, and time.  Skin: Skin is warm and dry.  Psychiatric: She has a normal mood and affect. Her behavior is normal. Judgment and thought content normal.  Vitals reviewed.     BP (!) 136/52   Pulse (!) 50   Temp 97.3 F (36.3 C) (Oral)   Ht 5\' 5"  (1.651 m)   Wt 137 lb 9.6 oz (62.4 kg)   BMI 22.90 kg/m       Assessment & Plan:  1. Vaginal irritation - WET PREP FOR TRICH, YEAST, CLUE - metroNIDAZOLE (METROGEL) 0.75 % vaginal gel; Place 1 Applicatorful vaginally at bedtime.  Dispense: 70 g; Refill: 0  2. Acute vaginitis - metroNIDAZOLE (METROGEL) 0.75 % vaginal gel; Place 1 Applicatorful vaginally at bedtime.  Dispense: 70 g; Refill: 0  Keep Clean and dry Start probiotic Yeast has resolved  Wet prep has moderate amount of bacteria and patient continues to  complain of irration. Will treat with metrogel.  Keep all follow up appts with PCP  Evelina Dun, FNP

## 2016-10-22 NOTE — Patient Instructions (Addendum)
Vaginitis Vaginitis is an inflammation of the vagina. It is most often caused by a change in the normal balance of the bacteria and yeast that live in the vagina. This change in balance causes an overgrowth of certain bacteria or yeast, which causes the inflammation. There are different types of vaginitis, but the most common types are:  Bacterial vaginosis.  Yeast infection (candidiasis).  Trichomoniasis vaginitis. This is a sexually transmitted infection (STI).  Viral vaginitis.  Atrophic vaginitis.  Allergic vaginitis. What are the causes? The cause depends on the type of vaginitis. Vaginitis can be caused by:  Bacteria (bacterial vaginosis).  Yeast (yeast infection).  A parasite (trichomoniasis vaginitis)  A virus (viral vaginitis).  Low hormone levels (atrophic vaginitis). Low hormone levels can occur during pregnancy, breastfeeding, or after menopause.  Irritants, such as bubble baths, scented tampons, and feminine sprays (allergic vaginitis). Other factors can change the normal balance of the yeast and bacteria that live in the vagina. These include:  Antibiotic medicines.  Poor hygiene.  Diaphragms, vaginal sponges, spermicides, birth control pills, and intrauterine devices (IUD).  Sexual intercourse.  Infection.  Uncontrolled diabetes.  A weakened immune system. What are the signs or symptoms? Symptoms can vary depending on the cause of the vaginitis. Common symptoms include:  Abnormal vaginal discharge.  The discharge is white, gray, or yellow with bacterial vaginosis.  The discharge is thick, white, and cheesy with a yeast infection.  The discharge is frothy and yellow or greenish with trichomoniasis.  A bad vaginal odor.  The odor is fishy with bacterial vaginosis.  Vaginal itching, pain, or swelling.  Painful intercourse.  Pain or burning when urinating. Sometimes there are no symptoms. How is this treated? Treatment will vary depending on  the type of infection.  Bacterial vaginosis and trichomoniasis are often treated with antibiotic creams or pills.  Yeast infections are often treated with antifungal medicines, such as vaginal creams or suppositories.  Viral vaginitis has no cure, but symptoms can be treated with medicines that relieve discomfort. Your sexual partner should be treated as well.  Atrophic vaginitis may be treated with an estrogen cream, pill, suppository, or vaginal ring. If vaginal dryness occurs, lubricants and moisturizing creams may help. You may be told to avoid scented soaps, sprays, or douches.  Allergic vaginitis treatment involves quitting the use of the product that is causing the problem. Vaginal creams can be used to treat the symptoms. Follow these instructions at home:  Take all medicines as directed by your caregiver.  Keep your genital area clean and dry. Avoid soap and only rinse the area with water.  Avoid douching. It can remove the healthy bacteria in the vagina.  Do not use tampons or have sexual intercourse until your vaginitis has been treated. Use sanitary pads while you have vaginitis.  Wipe from front to back. This avoids the spread of bacteria from the rectum to the vagina.  Let air reach your genital area. ? Wear cotton underwear to decrease moisture buildup.  Avoid wearing underwear while you sleep until your vaginitis is gone.  Avoid tight pants and underwear or nylons without a cotton panel.  Take off wet clothing (especially bathing suits) as soon as possible.  Use mild, non-scented products. Avoid using irritants, such as:  Scented feminine sprays.  Fabric softeners.  Scented detergents.  Scented tampons.  Scented soaps or bubble baths.  Practice safe sex and use condoms. Condoms may prevent the spread of trichomoniasis and viral vaginitis. Contact a health care  provider if:  You have abdominal pain.  You have symptoms that last for more than 2-3  days.  You have a fever and your symptoms suddenly get worse. This information is not intended to replace advice given to you by your health care provider. Make sure you discuss any questions you have with your health care provider. Document Released: 04/21/2007 Document Revised: 05/15/2016 Document Reviewed: 05/15/2016 Elsevier Interactive Patient Education  2017 [employer].

## 2016-11-01 ENCOUNTER — Ambulatory Visit (INDEPENDENT_AMBULATORY_CARE_PROVIDER_SITE_OTHER): Payer: Medicare Other | Admitting: Pediatrics

## 2016-11-01 ENCOUNTER — Encounter: Payer: Self-pay | Admitting: Pediatrics

## 2016-11-01 VITALS — BP 131/67 | HR 49 | Temp 98.1°F | Ht 65.0 in | Wt 138.4 lb

## 2016-11-01 DIAGNOSIS — E119 Type 2 diabetes mellitus without complications: Secondary | ICD-10-CM | POA: Diagnosis not present

## 2016-11-01 DIAGNOSIS — Z794 Long term (current) use of insulin: Secondary | ICD-10-CM | POA: Diagnosis not present

## 2016-11-01 DIAGNOSIS — I10 Essential (primary) hypertension: Secondary | ICD-10-CM

## 2016-11-01 DIAGNOSIS — R5383 Other fatigue: Secondary | ICD-10-CM

## 2016-11-01 DIAGNOSIS — E785 Hyperlipidemia, unspecified: Secondary | ICD-10-CM | POA: Diagnosis not present

## 2016-11-01 DIAGNOSIS — L659 Nonscarring hair loss, unspecified: Secondary | ICD-10-CM | POA: Diagnosis not present

## 2016-11-01 LAB — BAYER DCA HB A1C WAIVED: HB A1C: 7.6 % — AB (ref <7.0)

## 2016-11-01 NOTE — Progress Notes (Signed)
  Subjective:   Patient ID: Susan Petty, female    DOB: 1945/09/07, 71 y.o.   MRN: [id number] CC: med problem f/u HPI: Susan Petty is a 71 y.o. female presenting for med problem f/u  Frequent urination during the day Wakes up once at night Drinks 4-5 cups of coffee every day  DM2: has eye exam 3 weeks ago, said it was ok Low of 45, high of 300 BID humalin, 25u am, 10u at night Regular insulin as needed  HTN: no cp, SOB Taking meds regularly  Has HA off and on, nose stops up at night Worse with season, thinks related to allergies  Thinks her mood is doing fine, doesn't have as much energy as usual  Vaginal irritation resolved with diflucan   Very worried about hair loss Noticed over the past few months has been getting worse No patches of hair, particularly top of head involved  Relevant past medical, surgical, family and social history reviewed. Allergies and medications reviewed and updated. History  Smoking Status  . Never Smoker  Smokeless Tobacco  . Never Used   ROS: Per HPI   Objective:    BP 131/67   Pulse (!) 49   Temp 98.1 F (36.7 C) (Oral)   Ht _0  (1.651 m)   Wt 138 lb 6.4 oz (62.8 kg)   BMI 23.03 kg/m   Wt Readings from Last 3 Encounters:  11/01/16 138 lb 6.4 oz (62.8 kg)  10/22/16 137 lb 9.6 oz (62.4 kg)  10/15/16 138 lb (62.6 kg)    Gen: NAD, alert, cooperative with exam, NCAT EYES: EOMI, no conjunctival injection, or no icterus ENT:  TMs pearly gray b/l, OP without erythema LYMPH: no cervical LAD NECK: nl thyroid CV: NRRR, normal S1/S2, no murmur, distal pulses 2+ b/l Resp: CTABL, no wheezes, normal WOB Abd: +BS, soft, mildly ttp throughout, ND. no guarding or organomegaly Ext: No edema, warm Neuro: Alert and oriented, strength equal b/l UE and LE, coordination grossly normal MSK: normal muscle bulk  Skin: thinning hair over crown of hair, no patches of hair loss  Assessment & Plan:  Pasha was seen today for f/u multiple med  problems.  Diagnoses and all orders for this visit:  Hair loss Will check labs -     CBC with Differential/Platelet -     Ferritin -     TSH -     VITAMIN D 25 Hydroxy (Vit-D Deficiency, Fractures)  Other fatigue -     CBC with Differential/Platelet -     Ferritin -     TSH -     VITAMIN D 25 Hydroxy (Vit-D Deficiency, Fractures)  Essential hypertension Adequate control, cont current meds -     BMP8+EGFR  Type 2 diabetes mellitus with insulin therapy (HCC) a1c 7.6 Cont current meds -     Bayer DCA Hb A1c Waived -     Microalbumin / creatinine urine ratio  Hyperlipidemia, unspecified hyperlipidemia type Stable, cont statin      Lipid panel   Follow up plan: Return in about 3 months (around 01/31/2017). Susan Found, MD [place]

## 2016-11-02 LAB — LIPID PANEL
CHOL/HDL RATIO: 2.2 ratio (ref 0.0–4.4)
Cholesterol, Total: 163 mg/dL (ref 100–199)
HDL: 74 mg/dL (ref >39)
LDL CALC: 70 mg/dL (ref 0–99)
TRIGLYCERIDES: 95 mg/dL (ref 0–149)
VLDL CHOLESTEROL CAL: 19 mg/dL (ref 5–40)

## 2016-11-02 LAB — BMP8+EGFR
BUN/Creatinine Ratio: 19 (ref 12–28)
BUN: 13 mg/dL (ref 8–27)
CALCIUM: 9.7 mg/dL (ref 8.7–10.3)
CHLORIDE: 100 mmol/L (ref 96–106)
CO2: 31 mmol/L — AB (ref 18–29)
Creatinine, Ser: 0.69 mg/dL (ref 0.57–1.00)
GFR calc Af Amer: 100 mL/min/{1.73_m2} (ref >59)
GFR calc non Af Amer: 87 mL/min/{1.73_m2} (ref >59)
GLUCOSE: 185 mg/dL — AB (ref 65–99)
POTASSIUM: 4.4 mmol/L (ref 3.5–5.2)
SODIUM: 144 mmol/L (ref 134–144)

## 2016-11-02 LAB — CBC WITH DIFFERENTIAL/PLATELET
BASOS ABS: 0.1 10*3/uL (ref 0.0–0.2)
Basos: 2 %
EOS (ABSOLUTE): 0.2 10*3/uL (ref 0.0–0.4)
Eos: 4 %
Hematocrit: 36.8 % (ref 34.0–46.6)
Hemoglobin: 12.5 g/dL (ref 11.1–15.9)
IMMATURE GRANS (ABS): 0 10*3/uL (ref 0.0–0.1)
IMMATURE GRANULOCYTES: 0 %
LYMPHS: 50 %
Lymphocytes Absolute: 2.9 10*3/uL (ref 0.7–3.1)
MCH: 31.6 pg (ref 26.6–33.0)
MCHC: 34 g/dL (ref 31.5–35.7)
MCV: 93 fL (ref 79–97)
MONOS ABS: 0.3 10*3/uL (ref 0.1–0.9)
Monocytes: 4 %
NEUTROS PCT: 40 %
Neutrophils Absolute: 2.4 10*3/uL (ref 1.4–7.0)
PLATELETS: 191 10*3/uL (ref 150–379)
RBC: 3.96 x10E6/uL (ref 3.77–5.28)
RDW: 13.5 % (ref 12.3–15.4)
WBC: 5.9 10*3/uL (ref 3.4–10.8)

## 2016-11-02 LAB — FERRITIN: FERRITIN: 84 ng/mL (ref 15–150)

## 2016-11-02 LAB — TSH: TSH: 3.48 u[IU]/mL (ref 0.450–4.500)

## 2016-11-02 LAB — VITAMIN D 25 HYDROXY (VIT D DEFICIENCY, FRACTURES): Vit D, 25-Hydroxy: 48 ng/mL (ref 30.0–100.0)

## 2016-11-03 ENCOUNTER — Other Ambulatory Visit: Payer: Self-pay | Admitting: Pediatrics

## 2016-11-03 DIAGNOSIS — Z794 Long term (current) use of insulin: Secondary | ICD-10-CM

## 2016-11-03 DIAGNOSIS — I10 Essential (primary) hypertension: Secondary | ICD-10-CM

## 2016-11-03 DIAGNOSIS — E119 Type 2 diabetes mellitus without complications: Secondary | ICD-10-CM

## 2016-11-04 ENCOUNTER — Other Ambulatory Visit: Payer: Self-pay | Admitting: Pediatrics

## 2016-11-04 DIAGNOSIS — L658 Other specified nonscarring hair loss: Secondary | ICD-10-CM

## 2016-11-04 MED ORDER — MINOXIDIL 5 % EX FOAM: CUTANEOUS | 0 refills | Status: DC

## 2016-11-05 ENCOUNTER — Other Ambulatory Visit: Payer: Self-pay | Admitting: *Deleted

## 2016-11-07 ENCOUNTER — Telehealth: Payer: Self-pay | Admitting: Pediatrics

## 2016-11-07 NOTE — Telephone Encounter (Signed)
Patient aware rx's was sent to pharmacy.

## 2016-11-16 NOTE — Progress Notes (Signed)
Patient aware.  States she will have it done in July at appt

## 2016-11-28 ENCOUNTER — Ambulatory Visit (INDEPENDENT_AMBULATORY_CARE_PROVIDER_SITE_OTHER): Payer: Medicare Other | Admitting: Pediatrics

## 2016-11-28 ENCOUNTER — Encounter: Payer: Self-pay | Admitting: Pediatrics

## 2016-11-28 VITALS — BP 133/52 | HR 66 | Temp 97.6°F | Ht 65.0 in | Wt 138.4 lb

## 2016-11-28 DIAGNOSIS — N898 Other specified noninflammatory disorders of vagina: Secondary | ICD-10-CM

## 2016-11-28 DIAGNOSIS — R350 Frequency of micturition: Secondary | ICD-10-CM

## 2016-11-28 DIAGNOSIS — Z0000 Encounter for general adult medical examination without abnormal findings: Secondary | ICD-10-CM

## 2016-11-28 DIAGNOSIS — N952 Postmenopausal atrophic vaginitis: Secondary | ICD-10-CM

## 2016-11-28 DIAGNOSIS — L298 Other pruritus: Secondary | ICD-10-CM | POA: Diagnosis not present

## 2016-11-28 LAB — URINALYSIS, COMPLETE
Bilirubin, UA: NEGATIVE
Glucose, UA: NEGATIVE
Ketones, UA: NEGATIVE
LEUKOCYTES UA: NEGATIVE
Nitrite, UA: NEGATIVE
PH UA: 7.5 (ref 5.0–7.5)
PROTEIN UA: NEGATIVE
RBC, UA: NEGATIVE
Specific Gravity, UA: 1.02 (ref 1.005–1.030)
Urobilinogen, Ur: 2 mg/dL — ABNORMAL HIGH (ref 0.2–1.0)

## 2016-11-28 LAB — WET PREP FOR TRICH, YEAST, CLUE
Clue Cell Exam: POSITIVE — AB
TRICHOMONAS EXAM: NEGATIVE
YEAST EXAM: NEGATIVE

## 2016-11-28 LAB — MICROSCOPIC EXAMINATION
BACTERIA UA: NONE SEEN
RBC, UA: NONE SEEN /hpf (ref 0–?)
RENAL EPITHEL UA: NONE SEEN /HPF

## 2016-11-28 MED ORDER — ESTROGENS, CONJUGATED 0.625 MG/GM VA CREA
0.5000 g | TOPICAL_CREAM | VAGINAL | 12 refills | Status: DC
Start: 2016-11-28 — End: 2017-08-28

## 2016-11-28 NOTE — Progress Notes (Signed)
  Subjective:   Patient ID: [name], female    DOB: 30-Mar-1946, 71 y.o.   MRN: [id number] CC: Annual Exam; Vaginal burning; and Vaginal Itching  HPI: Susan Petty is a 71 y.o. female presenting for Annual Exam; Vaginal burning; and Vaginal Itching  Burning all the time in vaginal and rectal area No dysuria Not sexually active now  Otherwise has been feeling well  UTD mammogram  Colonoscopy 2017  Walking regularly  Relevant past medical, surgical, family and social history reviewed. Allergies and medications reviewed and updated. History  Smoking Status  . Never Smoker  Smokeless Tobacco  . Never Used   ROS: All systems neg other than what is in HPI  Objective:    BP (!) 133/52   Pulse 66   Temp 97.6 F (36.4 C) (Oral)   Ht 5\' 5"  (1.651 m)   Wt 138 lb 6.4 oz (62.8 kg)   BMI 23.03 kg/m   Wt Readings from Last 3 Encounters:  11/28/16 138 lb 6.4 oz (62.8 kg)  11/01/16 138 lb 6.4 oz (62.8 kg)  10/22/16 137 lb 9.6 oz (62.4 kg)    Gen: NAD, alert, cooperative with exam, NCAT EYES: EOMI, no conjunctival injection, or no icterus ENT:  TMs pearly gray b/l, OP without erythema LYMPH: no cervical LAD CV: NRRR, normal S1/S2, no murmur, distal pulses 2+ b/l Resp: CTABL, no wheezes, normal WOB Abd: +BS, soft, NTND. no guarding or organomegaly Ext: No edema, warm Neuro: Alert and oriented MSK: normal muscle bulk Breast: normal b/l GU: normal external female genitalia, vaginal atrophy, scant discharge present in vaginal vault  Assessment & Plan:  Susan Petty was seen today for annual exam, vaginal burning and vaginal itching.  Diagnoses and all orders for this visit:  Encounter for preventive health examination  Vaginal itching Positive for BV Treat with flagyl BID -     WET PREP FOR TRICH, YEAST, CLUE  Vaginal atrophy -     conjugated estrogens (PREMARIN) vaginal cream; Place 8.30 Applicatorfuls vaginally 2 (two) times a week.  Urinary frequency -      Urinalysis, Complete -     Urine culture  Other orders -     Microscopic Examination   Follow up plan: Return in about 3 months (around 02/28/2017). Assunta Found, MD [place]

## 2016-11-29 ENCOUNTER — Other Ambulatory Visit: Payer: Self-pay | Admitting: Pediatrics

## 2016-11-29 DIAGNOSIS — N760 Acute vaginitis: Principal | ICD-10-CM

## 2016-11-29 DIAGNOSIS — B9689 Other specified bacterial agents as the cause of diseases classified elsewhere: Secondary | ICD-10-CM

## 2016-11-29 LAB — URINE CULTURE: Organism ID, Bacteria: NO GROWTH

## 2016-11-29 MED ORDER — METRONIDAZOLE 500 MG PO TABS: 500.0000 mg | ORAL_TABLET | Freq: Two times a day (BID) | ORAL | 0 refills | Status: AC

## 2016-12-30 ENCOUNTER — Other Ambulatory Visit: Payer: Self-pay | Admitting: Pediatrics

## 2016-12-30 DIAGNOSIS — I10 Essential (primary) hypertension: Secondary | ICD-10-CM

## 2017-01-20 ENCOUNTER — Other Ambulatory Visit: Payer: Self-pay | Admitting: Pediatrics

## 2017-01-22 ENCOUNTER — Ambulatory Visit: Payer: Medicare Other | Admitting: Nurse Practitioner

## 2017-02-04 ENCOUNTER — Other Ambulatory Visit: Payer: Self-pay | Admitting: Pediatrics

## 2017-02-20 ENCOUNTER — Ambulatory Visit (INDEPENDENT_AMBULATORY_CARE_PROVIDER_SITE_OTHER): Payer: Medicare Other | Admitting: Pediatrics

## 2017-02-20 ENCOUNTER — Encounter: Payer: Self-pay | Admitting: Pediatrics

## 2017-02-20 VITALS — BP 137/63 | HR 50 | Temp 97.9°F | Ht 65.0 in | Wt 137.8 lb

## 2017-02-20 DIAGNOSIS — I10 Essential (primary) hypertension: Secondary | ICD-10-CM | POA: Diagnosis not present

## 2017-02-20 DIAGNOSIS — N898 Other specified noninflammatory disorders of vagina: Secondary | ICD-10-CM

## 2017-02-20 DIAGNOSIS — E785 Hyperlipidemia, unspecified: Secondary | ICD-10-CM

## 2017-02-20 DIAGNOSIS — N949 Unspecified condition associated with female genital organs and menstrual cycle: Secondary | ICD-10-CM

## 2017-02-20 DIAGNOSIS — Z794 Long term (current) use of insulin: Secondary | ICD-10-CM | POA: Diagnosis not present

## 2017-02-20 DIAGNOSIS — E119 Type 2 diabetes mellitus without complications: Secondary | ICD-10-CM

## 2017-02-20 DIAGNOSIS — N9489 Other specified conditions associated with female genital organs and menstrual cycle: Secondary | ICD-10-CM

## 2017-02-20 DIAGNOSIS — L298 Other pruritus: Secondary | ICD-10-CM | POA: Diagnosis not present

## 2017-02-20 LAB — URINALYSIS, COMPLETE
BILIRUBIN UA: NEGATIVE
Glucose, UA: NEGATIVE
Ketones, UA: NEGATIVE
Leukocytes, UA: NEGATIVE
NITRITE UA: NEGATIVE
PH UA: 5.5 (ref 5.0–7.5)
PROTEIN UA: NEGATIVE
RBC UA: NEGATIVE
Specific Gravity, UA: 1.02 (ref 1.005–1.030)
UUROB: 0.2 mg/dL (ref 0.2–1.0)

## 2017-02-20 LAB — MICROSCOPIC EXAMINATION
Bacteria, UA: NONE SEEN
Epithelial Cells (non renal): NONE SEEN /hpf (ref 0–10)
RBC, UA: NONE SEEN /hpf (ref 0–?)
Renal Epithel, UA: NONE SEEN /hpf
WBC, UA: NONE SEEN /hpf (ref 0–?)

## 2017-02-20 LAB — WET PREP FOR TRICH, YEAST, CLUE
CLUE CELL EXAM: NEGATIVE
TRICHOMONAS EXAM: NEGATIVE
YEAST EXAM: NEGATIVE

## 2017-02-20 LAB — BAYER DCA HB A1C WAIVED: HB A1C (BAYER DCA - WAIVED): 7.2 % — ABNORMAL HIGH (ref <7.0)

## 2017-02-20 MED ORDER — GLUCOSE BLOOD VI STRP: ORAL_STRIP | 2 refills | Status: DC

## 2017-02-20 MED ORDER — HYDROCHLOROTHIAZIDE 25 MG PO TABS: 25.0000 mg | ORAL_TABLET | Freq: Every day | ORAL | 1 refills | Status: DC

## 2017-02-20 MED ORDER — INSULIN NPH (HUMAN) (ISOPHANE) 100 UNIT/ML ~~LOC~~ SUSP: SUBCUTANEOUS | 1 refills | Status: DC

## 2017-02-20 MED ORDER — AMLODIPINE BESYLATE 5 MG PO TABS: 5.0000 mg | ORAL_TABLET | Freq: Every day | ORAL | 1 refills | Status: DC

## 2017-02-20 MED ORDER — INSULIN REGULAR HUMAN 100 UNIT/ML IJ SOLN: 5.0000 [IU] | Freq: Three times a day (TID) | INTRAMUSCULAR | 5 refills | Status: DC

## 2017-02-20 MED ORDER — METOPROLOL TARTRATE 25 MG PO TABS: 25.0000 mg | ORAL_TABLET | Freq: Two times a day (BID) | ORAL | 3 refills | Status: DC

## 2017-02-20 MED ORDER — METFORMIN HCL 500 MG PO TABS: 1000.0000 mg | ORAL_TABLET | Freq: Two times a day (BID) | ORAL | 0 refills | Status: DC

## 2017-02-20 MED ORDER — GLUCOSE BLOOD VI STRP: ORAL_STRIP | 1 refills | Status: DC

## 2017-02-20 MED ORDER — BENAZEPRIL HCL 40 MG PO TABS: 40.0000 mg | ORAL_TABLET | Freq: Every day | ORAL | 1 refills | Status: DC

## 2017-02-20 MED ORDER — PRAVASTATIN SODIUM 40 MG PO TABS: 40.0000 mg | ORAL_TABLET | Freq: Every day | ORAL | 0 refills | Status: DC

## 2017-02-20 NOTE — Progress Notes (Signed)
Subjective:   Patient ID: Susan Petty, female    DOB: 1946/05/10, 71 y.o.   MRN: [id number] CC: Follow-up (3 month) multiple med problems HPI: Susan Petty is a 71 y.o. female presenting for Follow-up (3 month)  DM2: BGLs sometimes low, last night was 55 at 2am Ate cheese and crackers then a honey bun Often has low numbers in the early morning Eats three meals a day  Was diagnosed with diabetes about 30 years ago Has never been overweight Has been on insulin since soon after diagnosis  Continues to have some itching and irritation in vaginal area Didn't try premarin cream, was concerned about risk of cancer No h/o breast cancer, gyn cancer No abd pain, no back pain No fevers Appetite has been normal  Thinks hair growth increasing  HTN: no CP, no SOB Taking meds regularly  Relevant past medical, surgical, family and social history reviewed. Allergies and medications reviewed and updated. History  Smoking Status  . Never Smoker  Smokeless Tobacco  . Never Used   ROS: Per HPI   Objective:    BP 137/63   Pulse (!) 50   Temp 97.9 F (36.6 C) (Oral)   Ht 5\' 5"  (1.651 m)   Wt 137 lb 12.8 oz (62.5 kg)   BMI 22.93 kg/m   Wt Readings from Last 3 Encounters:  02/20/17 137 lb 12.8 oz (62.5 kg)  11/28/16 138 lb 6.4 oz (62.8 kg)  11/01/16 138 lb 6.4 oz (62.8 kg)    Gen: NAD, alert, cooperative with exam, NCAT EYES: EOMI, no conjunctival injection, or no icterus ENT:  TMs pearly gray b/l, OP without erythema LYMPH: no cervical LAD CV: NRRR, normal S1/S2, no murmur, distal pulses 2+ b/l Resp: CTABL, no wheezes, normal WOB Abd: +BS, soft, NTND. no guarding or organomegaly Ext: No edema, warm Neuro: Alert and oriented  Assessment & Plan:  Bintou was seen today for follow-up med problems  Diagnoses and all orders for this visit:  Type 2 diabetes mellitus with insulin therapy (HCC) A1c 7.2 Decrease evening insulin dose to 5u to help prevent early AM lows -      Bayer DCA Hb A1c Waived -     Microalbumin / creatinine urine ratio -     metFORMIN (GLUCOPHAGE) 500 MG tablet; Take 2 tablets (1,000 mg total) by mouth 2 (two) times daily with a meal. -     insulin regular (NOVOLIN R,HUMULIN R) 100 units/mL injection; Inject 0.05 mLs (5 Units total) into the skin 3 (three) times daily before meals. -     insulin NPH Human (HUMULIN N) 100 UNIT/ML injection; INJECT 25 UNITS EACH MORNING AND 5 UNITS EACH EVENING -     glucose blood test strip; Use as instructed  Vaginal burning Recent exam in the past with vaginal atrophy, pt says no worse but not improving Discussed again feminine moisturizing products, using premarin cream twice a week in vagina Will refer to gyn as has been ongoing issue UA and wet prep unremarkable -     Urinalysis, Complete -     WET PREP FOR TRICH, YEAST, CLUE  Vaginal itching -     Urinalysis, Complete -     WET PREP FOR TRICH, YEAST, CLUE -     Microscopic Examination  Essential hypertension Adequate control, cont current meds -     metoprolol tartrate (LOPRESSOR) 25 MG tablet; Take 1 tablet (25 mg total) by mouth 2 (two) times daily. -  hydrochlorothiazide (HYDRODIURIL) 25 MG tablet; Take 1 tablet (25 mg total) by mouth daily. -     benazepril (LOTENSIN) 40 MG tablet; Take 1 tablet (40 mg total) by mouth daily. -     amLODipine (NORVASC) 5 MG tablet; Take 1 tablet (5 mg total) by mouth daily.  Vaginal irritation -     Ambulatory referral to Gynecology  Hyperlipidemia, unspecified hyperlipidemia type Stable, cont below -     pravastatin (PRAVACHOL) 40 MG tablet; Take 1 tablet (40 mg total) by mouth daily.  Follow up plan: Return in about 3 months (around 05/23/2017) for DM f/u. Assunta Found, MD [place]

## 2017-02-20 NOTE — Patient Instructions (Addendum)
replens or vagifem feminine moisturizing products for vaginal dryness Use several times a day  If still with symptoms, try premarin cream twice a week for a few weeks  If not improving or ongoing symptoms, let me know I will refer you to gynecology  Take 25u humalin in the morning Decrease to 5u at night to prevent early morning lows

## 2017-02-22 ENCOUNTER — Encounter: Payer: Self-pay | Admitting: Pediatrics

## 2017-02-22 MED ORDER — INSULIN NPH (HUMAN) (ISOPHANE) 100 UNIT/ML ~~LOC~~ SUSP: SUBCUTANEOUS | 1 refills | Status: DC

## 2017-02-24 ENCOUNTER — Encounter: Payer: Self-pay | Admitting: *Deleted

## 2017-03-08 DIAGNOSIS — H04123 Dry eye syndrome of bilateral lacrimal glands: Secondary | ICD-10-CM | POA: Diagnosis not present

## 2017-03-08 DIAGNOSIS — H40033 Anatomical narrow angle, bilateral: Secondary | ICD-10-CM | POA: Diagnosis not present

## 2017-03-14 ENCOUNTER — Telehealth: Payer: Self-pay | Admitting: Pediatrics

## 2017-03-26 ENCOUNTER — Ambulatory Visit (INDEPENDENT_AMBULATORY_CARE_PROVIDER_SITE_OTHER): Payer: Medicare Other | Admitting: Nurse Practitioner

## 2017-03-26 ENCOUNTER — Encounter: Payer: Self-pay | Admitting: Nurse Practitioner

## 2017-03-26 VITALS — BP 132/60 | HR 57 | Temp 97.8°F | Ht 65.0 in | Wt 135.8 lb

## 2017-03-26 DIAGNOSIS — K219 Gastro-esophageal reflux disease without esophagitis: Secondary | ICD-10-CM | POA: Diagnosis not present

## 2017-03-26 DIAGNOSIS — K5900 Constipation, unspecified: Secondary | ICD-10-CM

## 2017-03-26 MED ORDER — OMEPRAZOLE 40 MG PO CPDR: 40.0000 mg | DELAYED_RELEASE_CAPSULE | Freq: Two times a day (BID) | ORAL | 3 refills | Status: DC

## 2017-03-26 NOTE — Progress Notes (Signed)
Referring Provider: Eustaquio Maize, MD Primary Care Physician:  Eustaquio Maize, MD Primary GI:  Dr. Oneida Alar  Chief Complaint  Patient presents with  . Gastroesophageal Reflux  . Constipation    HPI:   Susan Petty is a 71 y.o. female who presents for follow-up on GERD and constipation. The patient was last seen in our office 10/02/2016 for GERD and constipation. Reviewed previous colonoscopy and endoscopy report which were on file and up-to-date, completed 2017. At that time deemed dyspepsia likely due to GERD and nerd uncontrolled due to dietary nonadherence. At her last visit her GERD was not well controlled, when asked about dietary recommendation adherent she states she doesn't eat tomatoes which is the biggest thing. Symptoms are persistent, worse at night, currently on omeprazole twice a day 40 mg. Denies any other GI symptoms. Recommended stop omeprazole, start Protonix 40 mg daily, call in 1-2 weeks with a progress report. Reemphasized trigger food avoidance, continue Amitiza, follow-up in 3 months.   A progress report was not called as requested.  Today she states she's doing well overall. Constipation well controlled on Amitiza. Tried Protonix which didn't help so she stopped them. No progress report called to our office. Went back to omeprazole. GERD is doing well overall on omeprazole, has breakthrough about once a week. Denies abdominal pain, N/V, hematochezia, melena, fever, unintentional weight loss. Denies chest pain, dyspnea, dizziness, lightheadedness, syncope, near syncope. Denies any other upper or lower GI symptoms.  Past Medical History:  Diagnosis Date  . Diabetes mellitus   . GERD (gastroesophageal reflux disease)   . Hypertension   . Osteoporosis     Past Surgical History:  Procedure Laterality Date  . COLONOSCOPY N/A 03/29/2016   Procedure: COLONOSCOPY;  Surgeon: Danie Binder, MD;  Location: [facility];  Service: Endoscopy;  Laterality: N/A;   10:00 am  . ESOPHAGOGASTRODUODENOSCOPY N/A 03/29/2016   Procedure: ESOPHAGOGASTRODUODENOSCOPY (EGD);  Surgeon: Danie Binder, MD;  Location: [facility];  Service: Endoscopy;  Laterality: N/A;  . TOTAL HIP ARTHROPLASTY  09/03/2011   Procedure: TOTAL HIP ARTHROPLASTY;  Surgeon: Mauri Pole, MD;  Location: [facility];  Service: Orthopedics;  Laterality: Left;    Current Outpatient Prescriptions  Medication Sig Dispense Refill  . alendronate (FOSAMAX) 70 MG tablet TAKE ONE TABLET BY MOUTH ONCE A WEEK WITH A FULL GLASS OF WATER ON AN EMPTY STOMACH 4 tablet 2  . AMITIZA 8 MCG capsule TAKE ONE CAPSULE BY MOUTH TWICE DAILY WITH A MEAL 60 capsule 11  . amLODipine (NORVASC) 5 MG tablet Take 1 tablet (5 mg total) by mouth daily. 90 tablet 1  . aspirin EC 81 MG tablet Take 81 mg by mouth daily.    . benazepril (LOTENSIN) 40 MG tablet Take 1 tablet (40 mg total) by mouth daily. 90 tablet 1  . cetirizine (ZYRTEC) 10 MG tablet Take 1 tablet (10 mg total) by mouth daily. 30 tablet 11  . Cholecalciferol (VITAMIN D-3 PO) Take 2,000 Units by mouth daily.    Marland Kitchen conjugated estrogens (PREMARIN) vaginal cream Place 3.79 Applicatorfuls vaginally 2 (two) times a week. 42.5 g 12  . fluticasone (FLONASE) 50 MCG/ACT nasal spray Place 2 sprays into both nostrils daily. (Patient taking differently: Place 2 sprays into both nostrils as needed. ) 16 g 6  . glucose blood (ACCU-CHEK AVIVA PLUS) test strip USE ONE STRIP TO CHECK GLUCOSE 4 TIMES DAILY 150 each 1  . glucose blood test strip Use as  instructed 100 each 2  . hydrochlorothiazide (HYDRODIURIL) 25 MG tablet Take 1 tablet (25 mg total) by mouth daily. 90 tablet 1  . insulin NPH Human (HUMULIN N) 100 UNIT/ML injection INJECT 25 UNITS EACH MORNING AND 5 UNITS EACH EVENING 30 mL 1  . insulin regular (NOVOLIN R,HUMULIN R) 100 units/mL injection Inject 0.05 mLs (5 Units total) into the skin 3 (three) times daily before meals. 10 mL 5  . metFORMIN (GLUCOPHAGE) 500 MG tablet  Take 2 tablets (1,000 mg total) by mouth 2 (two) times daily with a meal. 360 tablet 0  . metoprolol tartrate (LOPRESSOR) 25 MG tablet Take 1 tablet (25 mg total) by mouth 2 (two) times daily. 60 tablet 3  . Minoxidil 5 % FOAM Massage 26mL into scalp once a day two hours before bedtime 60 g 0  . omeprazole (PRILOSEC) 40 MG capsule Take 40 mg by mouth 2 (two) times daily.     . pravastatin (PRAVACHOL) 40 MG tablet Take 1 tablet (40 mg total) by mouth daily. 90 tablet 0   No current facility-administered medications for this visit.     Allergies as of 03/26/2017 - Review Complete 03/26/2017  Allergen Reaction Noted  . Sulfa antibiotics  06/20/2012  . Sulfa drugs cross reactors Rash 09/02/2011    Family History  Problem Relation Age of Onset  . Hypertension Mother   . Hypertension Father   . Diabetes Father   . Hypertension Sister   . Cancer Sister   . Diabetes Brother   . Diabetes Brother   . Colon cancer Neg Hx     Social History   Social History  . Marital status: Widowed    Spouse name: N/A  . Number of children: N/A  . Years of education: N/A   Social History Main Topics  . Smoking status: Never Smoker  . Smokeless tobacco: Never Used  . Alcohol use Yes     Comment: occasional wine  . Drug use: No  . Sexual activity: Not Asked   Other Topics Concern  . None   Social History Narrative  . None    Review of Systems: Complete ROS negative except as per HPI.   Physical Exam: BP 132/60   Pulse (!) 57   Temp 97.8 F (36.6 C) (Oral)   Ht 5\' 5"  (1.651 m)   Wt 135 lb 12.8 oz (61.6 kg)   BMI 22.60 kg/m  General:   Alert and oriented. Pleasant and cooperative. Well-nourished and well-developed.  Eyes:  Without icterus, sclera clear and conjunctiva pink.  Ears:  Normal auditory acuity. Cardiovascular:  S1, S2 present without murmurs appreciated. Extremities without clubbing or edema. Respiratory:  Clear to auscultation bilaterally. No wheezes, rales, or rhonchi.  No distress.  Gastrointestinal:  +BS, soft, non-tender and non-distended. No HSM noted. No guarding or rebound. No masses appreciated.  Rectal:  Deferred  Musculoskalatal:  Symmetrical without gross deformities. Neurologic:  Alert and oriented x4;  grossly normal neurologically. Psych:  Alert and cooperative. Normal mood and affect. Heme/Lymph/Immune: No excessive bruising noted.    03/26/2017 2:19 PM   Disclaimer: This note was dictated with voice recognition software. Similar sounding words can inadvertently be transcribed and may not be corrected upon review.

## 2017-03-26 NOTE — Assessment & Plan Note (Signed)
Reflux generally well controlled on omeprazole twice daily. Protonix did not work. Occasional breakthrough symptoms about one time a week. I will have her take Zantac 150 mg as needed for breakthrough symptoms. I will refill her omeprazole for 90 day supply as requested. Return for follow-up in 6 months.

## 2017-03-26 NOTE — Assessment & Plan Note (Signed)
Constipation well managed on Amitiza. Recommend she continue Amitiza. Return for follow-up in 6 months.

## 2017-03-26 NOTE — Progress Notes (Signed)
cc'ed to pcp °

## 2017-03-26 NOTE — Patient Instructions (Signed)
1. Continue taking omeprazole twice a day. 2. I sent in a refill to your pharmacy for 90 day supply. 3. Continue taking Amitiza. 4. 4 days at you have breakthrough heartburn symptoms despite taking omeprazole, he can use Zantac 150 mg as needed. This is available over-the-counter. 5. Return for follow-up in 6 months. 6. Call if you have any questions or concerns.

## 2017-04-15 DIAGNOSIS — Z1283 Encounter for screening for malignant neoplasm of skin: Secondary | ICD-10-CM | POA: Diagnosis not present

## 2017-04-15 DIAGNOSIS — L658 Other specified nonscarring hair loss: Secondary | ICD-10-CM | POA: Diagnosis not present

## 2017-04-15 DIAGNOSIS — L570 Actinic keratosis: Secondary | ICD-10-CM | POA: Diagnosis not present

## 2017-04-15 DIAGNOSIS — X32XXXD Exposure to sunlight, subsequent encounter: Secondary | ICD-10-CM | POA: Diagnosis not present

## 2017-04-21 ENCOUNTER — Ambulatory Visit (INDEPENDENT_AMBULATORY_CARE_PROVIDER_SITE_OTHER): Payer: Medicare Other | Admitting: Pediatrics

## 2017-04-21 ENCOUNTER — Encounter: Payer: Self-pay | Admitting: Pediatrics

## 2017-04-21 VITALS — BP 137/68 | HR 60 | Temp 98.5°F | Ht 65.0 in | Wt 136.4 lb

## 2017-04-21 DIAGNOSIS — I10 Essential (primary) hypertension: Secondary | ICD-10-CM | POA: Diagnosis not present

## 2017-04-21 DIAGNOSIS — E119 Type 2 diabetes mellitus without complications: Secondary | ICD-10-CM | POA: Diagnosis not present

## 2017-04-21 DIAGNOSIS — L659 Nonscarring hair loss, unspecified: Secondary | ICD-10-CM | POA: Diagnosis not present

## 2017-04-21 DIAGNOSIS — R350 Frequency of micturition: Secondary | ICD-10-CM | POA: Diagnosis not present

## 2017-04-21 DIAGNOSIS — R232 Flushing: Secondary | ICD-10-CM | POA: Diagnosis not present

## 2017-04-21 DIAGNOSIS — Z794 Long term (current) use of insulin: Secondary | ICD-10-CM | POA: Diagnosis not present

## 2017-04-21 LAB — URINALYSIS, COMPLETE
BILIRUBIN UA: NEGATIVE
KETONES UA: NEGATIVE
Leukocytes, UA: NEGATIVE
NITRITE UA: NEGATIVE
PH UA: 5 (ref 5.0–7.5)
Protein, UA: NEGATIVE
RBC UA: NEGATIVE
UUROB: 0.2 mg/dL (ref 0.2–1.0)

## 2017-04-21 LAB — MICROSCOPIC EXAMINATION
EPITHELIAL CELLS (NON RENAL): NONE SEEN /HPF (ref 0–10)
RBC MICROSCOPIC, UA: NONE SEEN /HPF (ref 0–?)
Renal Epithel, UA: NONE SEEN /hpf
WBC UA: NONE SEEN /HPF (ref 0–?)

## 2017-04-21 MED ORDER — ESCITALOPRAM OXALATE 10 MG PO TABS: ORAL_TABLET | ORAL | 2 refills | Status: DC

## 2017-04-21 NOTE — Progress Notes (Addendum)
  Subjective:   Patient ID: Susan Petty, female    DOB: Jan 07, 1946, 71 y.o.   MRN: [id number] CC: Alopecia  HPI: Susan Petty is a 71 y.o. female presenting for Alopecia  DM: had eye exam at [facility] recently Has elevated blood sugar levels in the morning after she snacks on carbs the night before  Feels like having menopause symptoms again Now with emotions that come and go, sometimes starts crying out of the blue Mood in general has been fine Bothered with hotflashes as well Takes premarin cream twice a week for vaginal dryness Helps some  Continues to worry about hair loss Not growing as fast as it has in the past No bald areas Seen by dermatology, will f/u in 3 mo, consider treatment at that time Has had normal CBC, TSH within last few weeks Was prescribed minoxidil last visit, has not started, doesn't want to take something every day  HTN: no CP, no SOB Taking meds regularly  No fevers  Relevant past medical, surgical, family and social history reviewed. Allergies and medications reviewed and updated. History  Smoking Status  . Never Smoker  Smokeless Tobacco  . Never Used   ROS: Per HPI   Objective:    BP 137/68   Pulse 60   Temp 98.5 F (36.9 C) (Oral)   Ht 5\' 5"  (1.651 m)   Wt 136 lb 6.4 oz (61.9 kg)   BMI 22.70 kg/m   Wt Readings from Last 3 Encounters:  04/21/17 136 lb 6.4 oz (61.9 kg)  03/26/17 135 lb 12.8 oz (61.6 kg)  02/20/17 137 lb 12.8 oz (62.5 kg)    Gen: NAD, alert, cooperative with exam, NCAT EYES: EOMI, no conjunctival injection, or no icterus ENT:  OP without erythema LYMPH: no cervical LAD CV: NRRR, normal S1/S2, no murmur, distal pulses 2+ b/l Resp: CTABL, no wheezes, normal WOB Abd: +BS, soft, NTND. no guarding or organomegaly Ext: No edema, warm Neuro: Alert and oriented Skin: some thinning of the hair around edges of scalp No bald patches  Assessment & Plan:  Susan Petty was seen today for alopecia.  Diagnoses and all orders  for this visit:  Urinary frequency No fevers, burning off and on, has had vaginal irritation off and on, none now -     Urinalysis, Complete -     Urine Culture  Hot flashes Will do trial of below -     escitalopram (LEXAPRO) 10 MG tablet; Take half tab for 2 weeks then take full tab  Hair loss Start minoxidil   Essential hypertension Stable, cont current meds  Type 2 diabetes mellitus with insulin therapy ([place]) Avoid carbs at night  Follow up plan: Return in about 4 weeks (around 05/19/2017). Assunta Found, MD [facility]  ADDENDUM: Urine culture +, sending in nitrofurantoin 100mg  bid x 5 days

## 2017-04-24 LAB — URINE CULTURE

## 2017-04-28 ENCOUNTER — Telehealth: Payer: Self-pay | Admitting: Pediatrics

## 2017-04-28 MED ORDER — NITROFURANTOIN MONOHYD MACRO 100 MG PO CAPS: 100.0000 mg | ORAL_CAPSULE | Freq: Two times a day (BID) | ORAL | 0 refills | Status: AC

## 2017-04-28 NOTE — Addendum Note (Signed)
Addended by: Eustaquio Maize on: 04/28/2017 01:59 PM   Modules accepted: Orders

## 2017-05-20 ENCOUNTER — Other Ambulatory Visit: Payer: Self-pay | Admitting: Pediatrics

## 2017-05-23 ENCOUNTER — Ambulatory Visit (INDEPENDENT_AMBULATORY_CARE_PROVIDER_SITE_OTHER): Payer: Medicare Other | Admitting: Pediatrics

## 2017-05-23 ENCOUNTER — Encounter: Payer: Self-pay | Admitting: Pediatrics

## 2017-05-23 VITALS — BP 137/62 | HR 57 | Temp 97.4°F | Ht 65.0 in | Wt 136.4 lb

## 2017-05-23 DIAGNOSIS — E119 Type 2 diabetes mellitus without complications: Secondary | ICD-10-CM

## 2017-05-23 DIAGNOSIS — E785 Hyperlipidemia, unspecified: Secondary | ICD-10-CM

## 2017-05-23 DIAGNOSIS — I10 Essential (primary) hypertension: Secondary | ICD-10-CM

## 2017-05-23 DIAGNOSIS — Z23 Encounter for immunization: Secondary | ICD-10-CM | POA: Diagnosis not present

## 2017-05-23 DIAGNOSIS — L659 Nonscarring hair loss, unspecified: Secondary | ICD-10-CM | POA: Diagnosis not present

## 2017-05-23 DIAGNOSIS — Z794 Long term (current) use of insulin: Secondary | ICD-10-CM | POA: Diagnosis not present

## 2017-05-23 LAB — BMP8+EGFR
BUN/Creatinine Ratio: 14 (ref 12–28)
BUN: 12 mg/dL (ref 8–27)
CALCIUM: 9.5 mg/dL (ref 8.7–10.3)
CO2: 31 mmol/L — ABNORMAL HIGH (ref 20–29)
Chloride: 98 mmol/L (ref 96–106)
Creatinine, Ser: 0.85 mg/dL (ref 0.57–1.00)
GFR, EST AFRICAN AMERICAN: 79 mL/min/{1.73_m2} (ref >59)
GFR, EST NON AFRICAN AMERICAN: 68 mL/min/{1.73_m2} (ref >59)
Glucose: 299 mg/dL — ABNORMAL HIGH (ref 65–99)
POTASSIUM: 4.1 mmol/L (ref 3.5–5.2)
Sodium: 141 mmol/L (ref 134–144)

## 2017-05-23 LAB — BAYER DCA HB A1C WAIVED: HB A1C (BAYER DCA - WAIVED): 7.9 % — ABNORMAL HIGH (ref <7.0)

## 2017-05-23 NOTE — Progress Notes (Signed)
  Subjective:   Patient ID: Susan Petty, female    DOB: May 29, 1946, 71 y.o.   MRN: [id number] CC: Follow-up Multiple med problems HPI: LATALIA ETZLER is a 71 y.o. female presenting for Follow-up  DM2: Taking insulin twice every day NPH, 10 units with most meals Blood sugars low she skips mealtime insulin Blood sugars range from normal to the 300s She thinks been up for the last week, does not know why Try to avoid sugary foods No numb areas in the feet Gets eye exam every year, so she is not due yet  Hair loss: Patient thinks it is improving, there is just growing very slowly Does not have any bald patches right now She is pleased with her progress Never started the minoxidil  HTN: No chest pressure or headaches Taking medicines daily  HLD: taking med daily, no s/e   Relevant past medical, surgical, family and social history reviewed. Allergies and medications reviewed and updated. Social History   Tobacco Use  Smoking Status Never Smoker  Smokeless Tobacco Never Used   ROS: Per HPI   Objective:    BP 137/62   Pulse (!) 57   Temp (!) 97.4 F (36.3 C) (Oral)   Ht '5\' 5"'$  (1.651 m)   Wt 136 lb 6.4 oz (61.9 kg)   BMI 22.70 kg/m   Wt Readings from Last 3 Encounters:  05/23/17 136 lb 6.4 oz (61.9 kg)  04/21/17 136 lb 6.4 oz (61.9 kg)  03/26/17 135 lb 12.8 oz (61.6 kg)    Gen: NAD, alert, cooperative with exam, NCAT EYES: EOMI, no conjunctival injection, or no icterus ENT:   OP without erythema CV: NRRR, normal S1/S2, no murmur, distal pulses 2+ b/l Resp: CTABL, no wheezes, normal WOB Abd: +BS, soft, NTND. no guarding or organomegaly Ext: No edema, warm, normal foot exam--see separate documentation Neuro: Alert and oriented, strength equal b/l UE and LE, coordination grossly normal MSK: normal muscle bulk Skin: no patches of hair loss in scalp Healthy appearing scalp  Assessment & Plan:  Codie was seen today for follow-up multiple medical  problems  Diagnoses and all orders for this visit:  Type 2 diabetes mellitus with insulin therapy (HCC) A1c 7.9 Check BGLs before every meal, give mealtime insulin if eating Continue insulin at current dosing -     Bayer DCA Hb A1c Waived -     BMP8+EGFR -     Microalbumin / creatinine urine ratio  Need for immunization against influenza -     Flu Vaccine QUAD 36+ mos IM  Essential hypertension Stable, continue current meds  Hair loss Improved  Hyperlipidemia, unspecified hyperlipidemia type Stable, continue current meds  Follow up plan: Return in about 3 months (around 08/23/2017). Assunta Found, MD [place]

## 2017-05-24 LAB — MICROALBUMIN / CREATININE URINE RATIO
Creatinine, Urine: 76.5 mg/dL
MICROALB/CREAT RATIO: 4.3 mg/g{creat} (ref 0.0–30.0)
Microalbumin, Urine: 3.3 ug/mL

## 2017-06-06 ENCOUNTER — Other Ambulatory Visit: Payer: Self-pay | Admitting: Pediatrics

## 2017-06-06 DIAGNOSIS — E119 Type 2 diabetes mellitus without complications: Secondary | ICD-10-CM

## 2017-06-06 DIAGNOSIS — Z794 Long term (current) use of insulin: Principal | ICD-10-CM

## 2017-07-02 ENCOUNTER — Other Ambulatory Visit: Payer: Self-pay | Admitting: Pediatrics

## 2017-07-02 DIAGNOSIS — E119 Type 2 diabetes mellitus without complications: Secondary | ICD-10-CM

## 2017-07-02 DIAGNOSIS — Z794 Long term (current) use of insulin: Principal | ICD-10-CM

## 2017-07-14 DIAGNOSIS — Z1231 Encounter for screening mammogram for malignant neoplasm of breast: Secondary | ICD-10-CM | POA: Diagnosis not present

## 2017-08-28 ENCOUNTER — Encounter: Payer: Self-pay | Admitting: Pediatrics

## 2017-08-28 ENCOUNTER — Ambulatory Visit (INDEPENDENT_AMBULATORY_CARE_PROVIDER_SITE_OTHER): Payer: Medicare Other | Admitting: Pediatrics

## 2017-08-28 VITALS — BP 138/69 | HR 64 | Temp 97.5°F | Ht 65.0 in | Wt 136.0 lb

## 2017-08-28 DIAGNOSIS — Z794 Long term (current) use of insulin: Secondary | ICD-10-CM

## 2017-08-28 DIAGNOSIS — R232 Flushing: Secondary | ICD-10-CM

## 2017-08-28 DIAGNOSIS — I10 Essential (primary) hypertension: Secondary | ICD-10-CM | POA: Diagnosis not present

## 2017-08-28 DIAGNOSIS — E785 Hyperlipidemia, unspecified: Secondary | ICD-10-CM

## 2017-08-28 DIAGNOSIS — J309 Allergic rhinitis, unspecified: Secondary | ICD-10-CM | POA: Diagnosis not present

## 2017-08-28 DIAGNOSIS — N952 Postmenopausal atrophic vaginitis: Secondary | ICD-10-CM | POA: Diagnosis not present

## 2017-08-28 DIAGNOSIS — E119 Type 2 diabetes mellitus without complications: Secondary | ICD-10-CM

## 2017-08-28 LAB — BAYER DCA HB A1C WAIVED: HB A1C (BAYER DCA - WAIVED): 7.6 % — ABNORMAL HIGH (ref <7.0)

## 2017-08-28 MED ORDER — BENAZEPRIL HCL 40 MG PO TABS: 40.0000 mg | ORAL_TABLET | Freq: Every day | ORAL | 1 refills | Status: DC

## 2017-08-28 MED ORDER — METFORMIN HCL 500 MG PO TABS: 1000.0000 mg | ORAL_TABLET | Freq: Two times a day (BID) | ORAL | 1 refills | Status: DC

## 2017-08-28 MED ORDER — CETIRIZINE HCL 10 MG PO TABS: 10.0000 mg | ORAL_TABLET | Freq: Every day | ORAL | 11 refills | Status: DC

## 2017-08-28 MED ORDER — METOPROLOL TARTRATE 25 MG PO TABS: 25.0000 mg | ORAL_TABLET | Freq: Two times a day (BID) | ORAL | 1 refills | Status: DC

## 2017-08-28 MED ORDER — HYDROCHLOROTHIAZIDE 25 MG PO TABS: 25.0000 mg | ORAL_TABLET | Freq: Every day | ORAL | 1 refills | Status: DC

## 2017-08-28 MED ORDER — PRAVASTATIN SODIUM 40 MG PO TABS: 40.0000 mg | ORAL_TABLET | Freq: Every day | ORAL | 1 refills | Status: DC

## 2017-08-28 MED ORDER — ESCITALOPRAM OXALATE 10 MG PO TABS: ORAL_TABLET | ORAL | 2 refills | Status: DC

## 2017-08-28 MED ORDER — AMLODIPINE BESYLATE 5 MG PO TABS: 5.0000 mg | ORAL_TABLET | Freq: Every day | ORAL | 1 refills | Status: DC

## 2017-08-28 MED ORDER — ESTROGENS, CONJUGATED 0.625 MG/GM VA CREA: 0.5000 g | TOPICAL_CREAM | VAGINAL | 12 refills | Status: DC

## 2017-08-28 MED ORDER — INSULIN NPH (HUMAN) (ISOPHANE) 100 UNIT/ML ~~LOC~~ SUSP: SUBCUTANEOUS | 1 refills | Status: DC

## 2017-08-28 MED ORDER — INSULIN REGULAR HUMAN 100 UNIT/ML IJ SOLN: 5.0000 [IU] | Freq: Three times a day (TID) | INTRAMUSCULAR | 5 refills | Status: DC

## 2017-08-28 NOTE — Progress Notes (Signed)
Subjective:   Patient ID: Susan Petty, female    DOB: [birth year], 72 y.o.   MRN: [id number] CC: Follow-up (3 month) multiple med problems HPI: Susan Petty is a 72 y.o. female presenting for Follow-up (3 month)  DM2: taking insulin regularly. sometimes has had slightly elevated BGLs, usually knows what made them high  Hot flashes: Improved on Lexapro.  Continue  Hypertension: No headaches, lightheadedness or shortness of breath.  Taking meds regularly  Vaginal atrophy: Has not had to use estrogen creams weekly for the last few weeks.  Symptoms improved.  Hyperlipidemia: Taking statin regularly  Relevant past medical, surgical, family and social history reviewed. Allergies and medications reviewed and updated. Social History   Tobacco Use  Smoking Status Never Smoker  Smokeless Tobacco Never Used   ROS: Per HPI   Objective:    BP 138/69   Pulse 64   Temp (!) 97.5 F (36.4 C) (Oral)   Ht 5\' 5"  (1.651 m)   Wt 136 lb (61.7 kg)   BMI 22.63 kg/m   Wt Readings from Last 3 Encounters:  08/28/17 136 lb (61.7 kg)  05/23/17 136 lb 6.4 oz (61.9 kg)  04/21/17 136 lb 6.4 oz (61.9 kg)    Gen: NAD, alert, cooperative with exam, NCAT EYES: EOMI, no conjunctival injection, or no icterus ENT:  TMs pearly gray b/l, OP without erythema LYMPH: no cervical LAD CV: NRRR, normal S1/S2, no murmur, distal pulses 2+ b/l Resp: CTABL, no wheezes, normal WOB Abd: +BS, soft, NTND. no guarding or organomegaly Ext: No edema, warm Neuro: Alert and oriented, strength equal b/l UE and LE, coordination grossly normal  Assessment & Plan:  Shemiah was seen today for follow-up medical problems.  Diagnoses and all orders for this visit:  Type 2 diabetes mellitus with insulin therapy (HCC) A1c today 7.2.  Continue below medicines, checking blood sugars regularly. -     Bayer DCA Hb A1c Waived -     insulin NPH Human (HUMULIN N) 100 UNIT/ML injection; INJECT 25 UNITS EACH MORNING AND 5 UNITS  EACH EVENING -     insulin regular (NOVOLIN R,HUMULIN R) 100 units/mL injection; Inject 0.05 mLs (5 Units total) into the skin 3 (three) times daily before meals. -     metFORMIN (GLUCOPHAGE) 500 MG tablet; Take 2 tablets (1,000 mg total) by mouth 2 (two) times daily with a meal.  Essential hypertension Stable, continue below medications. -     amLODipine (NORVASC) 5 MG tablet; Take 1 tablet (5 mg total) by mouth daily. -     benazepril (LOTENSIN) 40 MG tablet; Take 1 tablet (40 mg total) by mouth daily. -     hydrochlorothiazide (HYDRODIURIL) 25 MG tablet; Take 1 tablet (25 mg total) by mouth daily. -     metoprolol tartrate (LOPRESSOR) 25 MG tablet; Take 1 tablet (25 mg total) by mouth 2 (two) times daily. -     Basic Metabolic Panel  Allergic rhinitis -     cetirizine (ZYRTEC) 10 MG tablet; Take 1 tablet (10 mg total) by mouth daily.  Vaginal atrophy Improving.  Will send in refill but may be able to DC in future. -     conjugated estrogens (PREMARIN) vaginal cream; Place 4.12 Applicatorfuls vaginally 2 (two) times a week.  Hot flashes Stable, continue below -     escitalopram (LEXAPRO) 10 MG tablet; Take half tab for 2 weeks then take full tab  Hyperlipidemia, unspecified hyperlipidemia type Stable, continue below -  pravastatin (PRAVACHOL) 40 MG tablet; Take 1 tablet (40 mg total) by mouth daily.  Follow up plan: Return in about 3 months (around 11/25/2017). Assunta Found, MD [place]

## 2017-08-29 LAB — SPECIMEN STATUS REPORT

## 2017-08-29 LAB — BASIC METABOLIC PANEL
BUN/Creatinine Ratio: 16 (ref 12–28)
BUN: 12 mg/dL (ref 8–27)
CO2: 28 mmol/L (ref 20–29)
Calcium: 9.4 mg/dL (ref 8.7–10.3)
Chloride: 100 mmol/L (ref 96–106)
Creatinine, Ser: 0.74 mg/dL (ref 0.57–1.00)
GFR, EST AFRICAN AMERICAN: 93 mL/min/{1.73_m2} (ref >59)
GFR, EST NON AFRICAN AMERICAN: 81 mL/min/{1.73_m2} (ref >59)
Glucose: 116 mg/dL — ABNORMAL HIGH (ref 65–99)
POTASSIUM: 3.9 mmol/L (ref 3.5–5.2)
SODIUM: 143 mmol/L (ref 134–144)

## 2017-09-05 ENCOUNTER — Telehealth: Payer: Self-pay | Admitting: Pediatrics

## 2017-09-05 ENCOUNTER — Other Ambulatory Visit: Payer: Self-pay | Admitting: Pediatrics

## 2017-09-05 DIAGNOSIS — E119 Type 2 diabetes mellitus without complications: Secondary | ICD-10-CM

## 2017-09-05 DIAGNOSIS — Z794 Long term (current) use of insulin: Principal | ICD-10-CM

## 2017-09-05 NOTE — Telephone Encounter (Signed)
Test strips sent to pharmacy per patients request

## 2017-09-24 ENCOUNTER — Ambulatory Visit: Payer: Medicare Other | Admitting: Nurse Practitioner

## 2017-09-24 NOTE — Progress Notes (Deleted)
Referring Provider: Eustaquio Maize, MD Primary Care Physician:  Eustaquio Maize, MD Primary GI:  Dr. Oneida Alar  No chief complaint on file.   HPI:   Susan Petty is a 72 y.o. female who presents for follow-up on GERD and constipation.  The patient was last seen in our office 03/26/2017 for the same.  At that time she was doing well overall.  Constipation well controlled on Amitiza, Protonix was ineffective and no progress report called.  She went back to omeprazole on her well.  Her GERD was well controlled on this, breakthrough about once a week.  No other GI symptoms.  Recommended continue omeprazole twice daily, continue Amitiza, Zantac 150 mg as needed for breakthrough, follow-up in 6 months.  Today she states   Past Medical History:  Diagnosis Date  . Diabetes mellitus   . GERD (gastroesophageal reflux disease)   . Hypertension   . Osteoporosis     Past Surgical History:  Procedure Laterality Date  . COLONOSCOPY N/A 03/29/2016   Procedure: COLONOSCOPY;  Surgeon: Danie Binder, MD;  Location: [facility];  Service: Endoscopy;  Laterality: N/A;  10:00 am  . ESOPHAGOGASTRODUODENOSCOPY N/A 03/29/2016   Procedure: ESOPHAGOGASTRODUODENOSCOPY (EGD);  Surgeon: Danie Binder, MD;  Location: [facility];  Service: Endoscopy;  Laterality: N/A;  . TOTAL HIP ARTHROPLASTY  09/03/2011   Procedure: TOTAL HIP ARTHROPLASTY;  Surgeon: Mauri Pole, MD;  Location: [facility];  Service: Orthopedics;  Laterality: Left;    Current Outpatient Medications  Medication Sig Dispense Refill  . ACCU-CHEK AVIVA PLUS test strip USE AS DIRECTED 4 TIMES DAILY 100 each 2  . alendronate (FOSAMAX) 70 MG tablet TAKE ONE TABLET BY MOUTH ONCE A WEEK WITH A FULL GLASS OF WATER ON AN EMPTY STOMACH 4 tablet 2  . AMITIZA 8 MCG capsule TAKE ONE CAPSULE BY MOUTH TWICE DAILY WITH A MEAL 60 capsule 11  . amLODipine (NORVASC) 5 MG tablet Take 1 tablet (5 mg total) by mouth daily. 90 tablet 1  . aspirin EC 81 MG  tablet Take 81 mg by mouth daily.    . benazepril (LOTENSIN) 40 MG tablet Take 1 tablet (40 mg total) by mouth daily. 90 tablet 1  . cetirizine (ZYRTEC) 10 MG tablet Take 1 tablet (10 mg total) by mouth daily. 30 tablet 11  . Cholecalciferol (VITAMIN D-3 PO) Take 2,000 Units by mouth daily.    Marland Kitchen conjugated estrogens (PREMARIN) vaginal cream Place 6.38 Applicatorfuls vaginally 2 (two) times a week. 42.5 g 12  . escitalopram (LEXAPRO) 10 MG tablet Take half tab for 2 weeks then take full tab 30 tablet 2  . fluticasone (FLONASE) 50 MCG/ACT nasal spray Place 2 sprays into both nostrils daily. (Patient taking differently: Place 2 sprays into both nostrils as needed. ) 16 g 6  . glucose blood (ACCU-CHEK AVIVA PLUS) test strip USE ONE STRIP TO CHECK GLUCOSE 4 TIMES DAILY 150 each 1  . hydrochlorothiazide (HYDRODIURIL) 25 MG tablet Take 1 tablet (25 mg total) by mouth daily. 90 tablet 1  . insulin NPH Human (HUMULIN N) 100 UNIT/ML injection INJECT 25 UNITS EACH MORNING AND 5 UNITS EACH EVENING 30 mL 1  . insulin regular (NOVOLIN R,HUMULIN R) 100 units/mL injection Inject 0.05 mLs (5 Units total) into the skin 3 (three) times daily before meals. 10 mL 5  . metFORMIN (GLUCOPHAGE) 500 MG tablet Take 2 tablets (1,000 mg total) by mouth 2 (two) times daily with a meal.  360 tablet 1  . metoprolol tartrate (LOPRESSOR) 25 MG tablet Take 1 tablet (25 mg total) by mouth 2 (two) times daily. 180 tablet 1  . omeprazole (PRILOSEC) 40 MG capsule Take 1 capsule (40 mg total) by mouth 2 (two) times daily. 180 capsule 3  . pravastatin (PRAVACHOL) 40 MG tablet Take 1 tablet (40 mg total) by mouth daily. 90 tablet 1   No current facility-administered medications for this visit.     Allergies as of 09/24/2017 - Review Complete 08/28/2017  Allergen Reaction Noted  . Sulfa antibiotics  06/20/2012  . Sulfa drugs cross reactors Rash 09/02/2011    Family History  Problem Relation Age of Onset  . Hypertension Mother   .  Hypertension Father   . Diabetes Father   . Hypertension Sister   . Cancer Sister   . Diabetes Brother   . Diabetes Brother   . Colon cancer Neg Hx     Social History   Socioeconomic History  . Marital status: Widowed    Spouse name: Not on file  . Number of children: Not on file  . Years of education: Not on file  . Highest education level: Not on file  Social Needs  . Financial resource strain: Not on file  . Food insecurity - worry: Not on file  . Food insecurity - inability: Not on file  . Transportation needs - medical: Not on file  . Transportation needs - non-medical: Not on file  Occupational History  . Not on file  Tobacco Use  . Smoking status: Never Smoker  . Smokeless tobacco: Never Used  Substance and Sexual Activity  . Alcohol use: Yes    Comment: occasional wine  . Drug use: No  . Sexual activity: Not on file  Other Topics Concern  . Not on file  Social History Narrative  . Not on file    Review of Systems: Complete ROS negative except as per HPI.   Physical Exam: There were no vitals taken for this visit. General:   Alert and oriented. Pleasant and cooperative. Well-nourished and well-developed.  Head:  Normocephalic and atraumatic. Eyes:  Without icterus, sclera clear and conjunctiva pink.  Ears:  Normal auditory acuity. Mouth:  No deformity or lesions, oral mucosa pink.  Throat/Neck:  Supple, without mass or thyromegaly. Cardiovascular:  S1, S2 present without murmurs appreciated. Normal pulses noted. Extremities without clubbing or edema. Respiratory:  Clear to auscultation bilaterally. No wheezes, rales, or rhonchi. No distress.  Gastrointestinal:  +BS, soft, non-tender and non-distended. No HSM noted. No guarding or rebound. No masses appreciated.  Rectal:  Deferred  Musculoskalatal:  Symmetrical without gross deformities. Normal posture. Skin:  Intact without significant lesions or rashes. Neurologic:  Alert and oriented x4;  grossly  normal neurologically. Psych:  Alert and cooperative. Normal mood and affect. Heme/Lymph/Immune: No significant cervical adenopathy. No excessive bruising noted.    09/24/2017 12:31 PM   Disclaimer: This note was dictated with voice recognition software. Similar sounding words can inadvertently be transcribed and may not be corrected upon review.

## 2017-10-06 ENCOUNTER — Other Ambulatory Visit: Payer: Self-pay | Admitting: Gastroenterology

## 2017-10-29 ENCOUNTER — Encounter: Payer: Self-pay | Admitting: Gastroenterology

## 2017-11-27 ENCOUNTER — Encounter: Payer: Self-pay | Admitting: Nurse Practitioner

## 2017-11-27 ENCOUNTER — Ambulatory Visit (INDEPENDENT_AMBULATORY_CARE_PROVIDER_SITE_OTHER): Payer: Medicare Other | Admitting: Nurse Practitioner

## 2017-11-27 ENCOUNTER — Ambulatory Visit: Payer: Medicare Other | Admitting: Nurse Practitioner

## 2017-11-27 VITALS — BP 143/65 | HR 65 | Temp 97.6°F | Ht 65.0 in | Wt 133.8 lb

## 2017-11-27 DIAGNOSIS — K5900 Constipation, unspecified: Secondary | ICD-10-CM | POA: Diagnosis not present

## 2017-11-27 DIAGNOSIS — K219 Gastro-esophageal reflux disease without esophagitis: Secondary | ICD-10-CM | POA: Diagnosis not present

## 2017-11-27 NOTE — Patient Instructions (Signed)
1. Continue taking your current medications. 2. You can use Zantac 150 mg (over-the-counter) in the evenings before bed to help prevent nighttime symptoms. 3. Return for follow-up as needed. 4. Call us if you have any questions or concerns. 5. Call us if your symptoms get worse again.  At [facility] we value your feedback. You may receive a survey about your visit today. Please share your experience as we strive to create trusting relationships with our patients to provide genuine, compassionate, quality care.  It was great to see you today!  I hope you have a wonderful summer!!

## 2017-11-27 NOTE — Progress Notes (Signed)
Referring Provider: Eustaquio Maize, MD Primary Care Physician:  Eustaquio Maize, MD Primary GI:  Dr. Oneida Alar  Chief Complaint  Patient presents with  . Gastroesophageal Reflux    f/u. has good/bad days  . Constipation    BM daily. feels bloated all the time    HPI:   Susan Petty is a 72 y.o. female who presents for follow-up on GERD and constipation.  The patient was last seen in our office 03/26/2017 for the same.  Chronic history of GERD and constipation.  Previous colonoscopy up-to-date 2017 and dyspepsia deemed likely due to GERD and GERD uncontrolled due to dietary nonadherence.  At her last visit she was doing well overall.  Constipation well controlled on Amitiza, tried Protonix which did not help so she stopped them.  No progress report was called our office.  She restarted omeprazole and GERD was doing well overall on this.  Breakthrough about once a week.  No other GI symptoms.  Continue omeprazole twice daily, continue Amitiza, on days of breakthrough use Zantac 150 mg.  Follow-up in 6 months.  Today she states she's doing well overall. Has early satiety and bloating. Moving bowels well on Amitiza. She notes it's expensive. Has a bowel movement daily, consistent with Bristol 4, no straining. GERD symptoms are ok overall. A particular problem is needing a snack before bed (DM). Has not been using Zantac. Denies abdominal pain, N/V, hematochezia, melena, fever, chills. Thinks she may have lost weight, but objectively is within 2 lbs compared to 6 months ago. Denies chest pain, dyspnea, dizziness, lightheadedness, syncope, near syncope. Denies any other upper or lower GI symptoms.  Past Medical History:  Diagnosis Date  . Diabetes mellitus   . GERD (gastroesophageal reflux disease)   . Hypertension   . Osteoporosis     Past Surgical History:  Procedure Laterality Date  . COLONOSCOPY N/A 03/29/2016   Procedure: COLONOSCOPY;  Surgeon: Danie Binder, MD;  [facility];  Service: Endoscopy;  Laterality: N/A;  10:00 am  . ESOPHAGOGASTRODUODENOSCOPY N/A 03/29/2016   Procedure: ESOPHAGOGASTRODUODENOSCOPY (EGD);  Surgeon: Danie Binder, MD;  Location: [facility];  Service: Endoscopy;  Laterality: N/A;  . TOTAL HIP ARTHROPLASTY  09/03/2011   Procedure: TOTAL HIP ARTHROPLASTY;  Surgeon: Mauri Pole, MD;  Location: [facility];  Service: Orthopedics;  Laterality: Left;    Current Outpatient Medications  Medication Sig Dispense Refill  . ACCU-CHEK AVIVA PLUS test strip USE AS DIRECTED 4 TIMES DAILY 100 each 2  . alendronate (FOSAMAX) 70 MG tablet TAKE ONE TABLET BY MOUTH ONCE A WEEK WITH A FULL GLASS OF WATER ON AN EMPTY STOMACH 4 tablet 2  . AMITIZA 8 MCG capsule TAKE 1 CAPSULE BY MOUTH TWICE DAILY WITH A MEAL 180 capsule 3  . amLODipine (NORVASC) 5 MG tablet Take 1 tablet (5 mg total) by mouth daily. 90 tablet 1  . aspirin EC 81 MG tablet Take 81 mg by mouth daily.    . benazepril (LOTENSIN) 40 MG tablet Take 1 tablet (40 mg total) by mouth daily. 90 tablet 1  . cetirizine (ZYRTEC) 10 MG tablet Take 1 tablet (10 mg total) by mouth daily. 30 tablet 11  . Cholecalciferol (VITAMIN D-3 PO) Take 2,000 Units by mouth daily.    Marland Kitchen escitalopram (LEXAPRO) 10 MG tablet Take half tab for 2 weeks then take full tab 30 tablet 2  . fluticasone (FLONASE) 50 MCG/ACT nasal spray Place 2 sprays into  both nostrils daily. (Patient taking differently: Place 2 sprays into both nostrils as needed. ) 16 g 6  . glucose blood (ACCU-CHEK AVIVA PLUS) test strip USE ONE STRIP TO CHECK GLUCOSE 4 TIMES DAILY 150 each 1  . hydrochlorothiazide (HYDRODIURIL) 25 MG tablet Take 1 tablet (25 mg total) by mouth daily. 90 tablet 1  . insulin NPH Human (HUMULIN N) 100 UNIT/ML injection INJECT 25 UNITS EACH MORNING AND 5 UNITS EACH EVENING 30 mL 1  . insulin regular (NOVOLIN R,HUMULIN R) 100 units/mL injection Inject 0.05 mLs (5 Units total) into the skin 3 (three) times daily before meals. 10 mL 5    . metFORMIN (GLUCOPHAGE) 500 MG tablet Take 2 tablets (1,000 mg total) by mouth 2 (two) times daily with a meal. 360 tablet 1  . metoprolol tartrate (LOPRESSOR) 25 MG tablet Take 1 tablet (25 mg total) by mouth 2 (two) times daily. 180 tablet 1  . omeprazole (PRILOSEC) 40 MG capsule Take 1 capsule (40 mg total) by mouth 2 (two) times daily. 180 capsule 3  . pravastatin (PRAVACHOL) 40 MG tablet Take 1 tablet (40 mg total) by mouth daily. 90 tablet 1   No current facility-administered medications for this visit.     Allergies as of 11/27/2017 - Review Complete 11/27/2017  Allergen Reaction Noted  . Sulfa antibiotics  06/20/2012  . Sulfa drugs cross reactors Rash 09/02/2011    Family History  Problem Relation Age of Onset  . Hypertension Mother   . Hypertension Father   . Diabetes Father   . Hypertension Sister   . Cancer Sister   . Diabetes Brother   . Diabetes Brother   . Colon cancer Neg Hx     Social History   Socioeconomic History  . Marital status: Widowed    Spouse name: Not on file  . Number of children: Not on file  . Years of education: Not on file  . Highest education level: Not on file  Occupational History  . Not on file  Social Needs  . Financial resource strain: Not on file  . Food insecurity:    Worry: Not on file    Inability: Not on file  . Transportation needs:    Medical: Not on file    Non-medical: Not on file  Tobacco Use  . Smoking status: Never Smoker  . Smokeless tobacco: Never Used  Substance and Sexual Activity  . Alcohol use: Yes    Comment: occasional wine  . Drug use: No  . Sexual activity: Not on file  Lifestyle  . Physical activity:    Days per week: Not on file    Minutes per session: Not on file  . Stress: Not on file  Relationships  . Social connections:    Talks on phone: Not on file    Gets together: Not on file    Attends religious service: Not on file    Active member of club or organization: Not on file    Attends  meetings of clubs or organizations: Not on file    Relationship status: Not on file  Other Topics Concern  . Not on file  Social History Narrative  . Not on file    Review of Systems: Complete ROS negative except as per HPI.   Physical Exam: BP (!) 143/65   Pulse 65   Temp 97.6 F (36.4 C) (Oral)   Ht 5\' 5"  (1.651 m)   Wt 133 lb 12.8 oz (60.7 kg)   BMI  22.27 kg/m  General:   Alert and oriented. Pleasant and cooperative. Well-nourished and well-developed.  Eyes:  Without icterus, sclera clear and conjunctiva pink.  Ears:  Normal auditory acuity. Cardiovascular:  S1, S2 present without murmurs appreciated. Normal pulses noted. Extremities without clubbing or edema. Respiratory:  Clear to auscultation bilaterally. No wheezes, rales, or rhonchi. No distress.  Gastrointestinal:  +BS, soft, non-tender and non-distended. No HSM noted. No guarding or rebound. No masses appreciated.  Rectal:  Deferred  Neurologic:  Alert and oriented x4;  grossly normal neurologically. Psych:  Alert and cooperative. Normal mood and affect. Heme/Lymph/Immune: No excessive bruising noted.    11/27/2017 1:04 PM   Disclaimer: This note was dictated with voice recognition software. Similar sounding words can inadvertently be transcribed and may not be corrected upon review.

## 2017-11-27 NOTE — Assessment & Plan Note (Signed)
Symptoms are much improved on Amitiza.  Recommend she continue to take Amitiza.  Have a daily Bristol 4 stools.  Return for follow-up as needed.

## 2017-11-27 NOTE — Progress Notes (Signed)
cc'ed to pcp °

## 2017-11-27 NOTE — Assessment & Plan Note (Signed)
Infant is doing well on PPI.  She does sometimes have nighttime breakthrough symptoms because she is required to have a snack before bed.  Recommend she try Zantac in the evenings as needed for breakthrough symptoms.  At this point her GERD is doing pretty well, well managed.  Return for follow-up as needed.

## 2017-12-03 ENCOUNTER — Other Ambulatory Visit: Payer: Self-pay | Admitting: Pediatrics

## 2017-12-03 DIAGNOSIS — Z794 Long term (current) use of insulin: Principal | ICD-10-CM

## 2017-12-03 DIAGNOSIS — E119 Type 2 diabetes mellitus without complications: Secondary | ICD-10-CM

## 2017-12-11 ENCOUNTER — Other Ambulatory Visit: Payer: Self-pay | Admitting: *Deleted

## 2017-12-11 ENCOUNTER — Encounter: Payer: Self-pay | Admitting: Pediatrics

## 2017-12-11 ENCOUNTER — Ambulatory Visit (INDEPENDENT_AMBULATORY_CARE_PROVIDER_SITE_OTHER): Payer: Medicare Other | Admitting: Pediatrics

## 2017-12-11 VITALS — BP 138/60 | HR 56 | Temp 97.9°F | Ht 65.0 in | Wt 131.6 lb

## 2017-12-11 DIAGNOSIS — Z794 Long term (current) use of insulin: Secondary | ICD-10-CM

## 2017-12-11 DIAGNOSIS — M8000XD Age-related osteoporosis with current pathological fracture, unspecified site, subsequent encounter for fracture with routine healing: Secondary | ICD-10-CM | POA: Diagnosis not present

## 2017-12-11 DIAGNOSIS — E119 Type 2 diabetes mellitus without complications: Secondary | ICD-10-CM

## 2017-12-11 DIAGNOSIS — E785 Hyperlipidemia, unspecified: Secondary | ICD-10-CM

## 2017-12-11 DIAGNOSIS — B001 Herpesviral vesicular dermatitis: Secondary | ICD-10-CM

## 2017-12-11 DIAGNOSIS — I739 Peripheral vascular disease, unspecified: Secondary | ICD-10-CM | POA: Diagnosis not present

## 2017-12-11 DIAGNOSIS — I10 Essential (primary) hypertension: Secondary | ICD-10-CM

## 2017-12-11 DIAGNOSIS — R232 Flushing: Secondary | ICD-10-CM | POA: Diagnosis not present

## 2017-12-11 LAB — BAYER DCA HB A1C WAIVED: HB A1C (BAYER DCA - WAIVED): 7.1 % — ABNORMAL HIGH (ref <7.0)

## 2017-12-11 MED ORDER — VALACYCLOVIR HCL 1 G PO TABS: ORAL_TABLET | ORAL | 0 refills | Status: DC

## 2017-12-11 MED ORDER — INSULIN NPH (HUMAN) (ISOPHANE) 100 UNIT/ML ~~LOC~~ SUSP: SUBCUTANEOUS | 1 refills | Status: DC

## 2017-12-11 MED ORDER — INSULIN REGULAR HUMAN 100 UNIT/ML IJ SOLN: 3.0000 [IU] | Freq: Three times a day (TID) | INTRAMUSCULAR | 5 refills | Status: DC

## 2017-12-11 MED ORDER — GLUCOSE BLOOD VI STRP: 1.0000 | ORAL_STRIP | Freq: Every day | 3 refills | Status: DC

## 2017-12-11 MED ORDER — AMLODIPINE BESYLATE 5 MG PO TABS: 5.0000 mg | ORAL_TABLET | Freq: Every day | ORAL | 1 refills | Status: DC

## 2017-12-11 MED ORDER — ALENDRONATE SODIUM 70 MG PO TABS: ORAL_TABLET | ORAL | 2 refills | Status: DC

## 2017-12-11 MED ORDER — INSULIN REGULAR HUMAN 100 UNIT/ML IJ SOLN: 5.0000 [IU] | Freq: Three times a day (TID) | INTRAMUSCULAR | 5 refills | Status: DC

## 2017-12-11 MED ORDER — METFORMIN HCL 500 MG PO TABS: 1000.0000 mg | ORAL_TABLET | Freq: Two times a day (BID) | ORAL | 1 refills | Status: DC

## 2017-12-11 MED ORDER — HYDROCHLOROTHIAZIDE 25 MG PO TABS: 25.0000 mg | ORAL_TABLET | Freq: Every day | ORAL | 1 refills | Status: DC

## 2017-12-11 MED ORDER — METOPROLOL TARTRATE 25 MG PO TABS: 25.0000 mg | ORAL_TABLET | Freq: Two times a day (BID) | ORAL | 1 refills | Status: DC

## 2017-12-11 MED ORDER — ESCITALOPRAM OXALATE 10 MG PO TABS: ORAL_TABLET | ORAL | 2 refills | Status: DC

## 2017-12-11 MED ORDER — PRAVASTATIN SODIUM 40 MG PO TABS: 40.0000 mg | ORAL_TABLET | Freq: Every day | ORAL | 1 refills | Status: DC

## 2017-12-11 MED ORDER — BENAZEPRIL HCL 40 MG PO TABS: 40.0000 mg | ORAL_TABLET | Freq: Every day | ORAL | 1 refills | Status: DC

## 2017-12-11 MED ORDER — ESCITALOPRAM OXALATE 10 MG PO TABS: ORAL_TABLET | ORAL | 1 refills | Status: DC

## 2017-12-11 NOTE — Progress Notes (Signed)
Subjective:   Patient ID: Susan Petty, female    DOB: 12-27-1945, 72 y.o.   MRN: [id number] CC: Medical Management of Chronic Issues  HPI: Susan Petty is a 72 y.o. female   Can feel cold sore starting right-sided lower lip.  Has happened once before.  Medication at that time helped  Dm2: Taking 20 units of NPH in the morning.  5 units in the evening.  Taking regular insulin about once a day sugars are elevated.  Had one low sugar of 41 about a week ago.  Did not eat her evening snack as she usually does. This morning BGL 160, she says that is more typical.  Hyperlipidemia: Taking statin, tolerating, no side effects  Hot Flashes: Taking Lexapro, has improved symptoms  Osteoporosis: Taking alendronate once a week.  Last DEXA scan in 2015.  Hypertension: Taking medicine regularly.  Walking about once a week with her daughter.  Makes it twice around the track before the Start to bother her.  Feeling tired during the day, sleeping about 10 hours at night.  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  Happy she has more control over what she is eating now.  Thinks it has improved her blood sugar control.  Relevant past medical, surgical, family and social history reviewed. Allergies and medications reviewed and updated. Social History   Tobacco Use  Smoking Status Never Smoker  Smokeless Tobacco Never Used   ROS: Per HPI   Objective:    BP 138/60   Pulse (!) 56   Temp 97.9 F (36.6 C) (Oral)   Ht 5\' 5"  (1.651 m)   Wt 131 lb 9.6 oz (59.7 kg)   BMI 21.90 kg/m   Wt Readings from Last 3 Encounters:  12/11/17 131 lb 9.6 oz (59.7 kg)  11/27/17 133 lb 12.8 oz (60.7 kg)  08/28/17 136 lb (61.7 kg)    Gen: NAD, alert, cooperative with exam, NCAT EYES: EOMI, no conjunctival injection, or no icterus ENT: Right TM with white effusion. TMs pearly gray b/l, OP without erythema LYMPH: no cervical LAD CV: NRRR, normal S1/S2, no murmur, 2+ DP pulses bilaterally.    Resp: CTABL, no wheezes, normal WOB Abd: +BS, soft, NTND. no guarding or organomegaly Ext: No edema, warm Neuro: Alert and oriented, strength equal b/l UE and LE, coordination grossly normal MSK: normal muscle bulk  Assessment & Plan:  Braleigh was seen today for medical management of chronic issues.  Diagnoses and all orders for this visit:  Type 2 diabetes mellitus with insulin therapy (HCC) A1c 7.1. Cont to follow blood sugar levels 5 to 6 times a day. Any lows let me know.  -     Bayer DCA Hb A1c Waived -     insulin regular (NOVOLIN R,HUMULIN R) 100 units/mL injection; Inject 0.05 mLs (5 Units total) into the skin 3 (three) times daily before meals. -     insulin NPH Human (HUMULIN N) 100 UNIT/ML injection; INJECT 20 UNITS EACH MORNING AND 5 UNITS EACH EVENING -     metFORMIN (GLUCOPHAGE) 500 MG tablet; Take 2 tablets (1,000 mg total) by mouth 2 (two) times daily with a meal. -     glucose blood (ACCU-CHEK AVIVA PLUS) test strip; 1 each by Other route 6 (six) times daily. USE TO CHECK GLUCOSE 6 TIMES DAILY  Osteoporosis with pathological fracture, with routine healing, subsequent encounter Due for repeat dexa -     alendronate (FOSAMAX) 70 MG tablet; TAKE ONE TABLET  BY MOUTH ONCE A WEEK WITH A FULL GLASS OF WATER ON AN EMPTY STOMACH -     DG [facility] DEXA  Essential hypertension Stable, continue below -     amLODipine (NORVASC) 5 MG tablet; Take 1 tablet (5 mg total) by mouth daily. -     benazepril (LOTENSIN) 40 MG tablet; Take 1 tablet (40 mg total) by mouth daily. -     hydrochlorothiazide (HYDRODIURIL) 25 MG tablet; Take 1 tablet (25 mg total) by mouth daily. -     metoprolol tartrate (LOPRESSOR) 25 MG tablet; Take 1 tablet (25 mg total) by mouth 2 (two) times daily.  Hot flashes Stable, continue below -     escitalopram (LEXAPRO) 10 MG tablet;  take full tab daily  Hyperlipidemia, unspecified hyperlipidemia type Stable, continue below -     pravastatin (PRAVACHOL) 40 MG  tablet; Take 1 tablet (40 mg total) by mouth daily.  Herpes labialis Start below, take as needed. -     valACYclovir (VALTREX) 1000 MG tablet; 2g twice daily for 2 doses, start as soon as symptoms begin  Intermittent claudication (HCC) Discussed increase walking to daily. On statin and asa. 2+ DP pulses b/l today. If ongoing symptoms will refer to vascular.  Follow up plan: Return in about 3 months (around 03/13/2018). Assunta Found, MD [place]

## 2017-12-19 ENCOUNTER — Ambulatory Visit: Payer: Medicare Other | Admitting: Pediatrics

## 2017-12-22 ENCOUNTER — Other Ambulatory Visit: Payer: Medicare Other

## 2018-02-16 ENCOUNTER — Other Ambulatory Visit: Payer: Self-pay | Admitting: Pediatrics

## 2018-02-16 DIAGNOSIS — Z794 Long term (current) use of insulin: Principal | ICD-10-CM

## 2018-02-16 DIAGNOSIS — E119 Type 2 diabetes mellitus without complications: Secondary | ICD-10-CM

## 2018-03-04 DIAGNOSIS — C44629 Squamous cell carcinoma of skin of left upper limb, including shoulder: Secondary | ICD-10-CM | POA: Diagnosis not present

## 2018-03-04 DIAGNOSIS — L570 Actinic keratosis: Secondary | ICD-10-CM | POA: Diagnosis not present

## 2018-03-04 DIAGNOSIS — X32XXXD Exposure to sunlight, subsequent encounter: Secondary | ICD-10-CM | POA: Diagnosis not present

## 2018-03-04 DIAGNOSIS — D225 Melanocytic nevi of trunk: Secondary | ICD-10-CM | POA: Diagnosis not present

## 2018-03-13 ENCOUNTER — Ambulatory Visit (INDEPENDENT_AMBULATORY_CARE_PROVIDER_SITE_OTHER): Payer: Medicare Other | Admitting: Pediatrics

## 2018-03-13 ENCOUNTER — Encounter: Payer: Self-pay | Admitting: Pediatrics

## 2018-03-13 VITALS — BP 131/66 | HR 66 | Temp 98.6°F | Ht 65.0 in | Wt 131.8 lb

## 2018-03-13 DIAGNOSIS — B001 Herpesviral vesicular dermatitis: Secondary | ICD-10-CM

## 2018-03-13 DIAGNOSIS — E119 Type 2 diabetes mellitus without complications: Secondary | ICD-10-CM

## 2018-03-13 DIAGNOSIS — Z794 Long term (current) use of insulin: Secondary | ICD-10-CM

## 2018-03-13 DIAGNOSIS — I10 Essential (primary) hypertension: Secondary | ICD-10-CM | POA: Diagnosis not present

## 2018-03-13 DIAGNOSIS — R399 Unspecified symptoms and signs involving the genitourinary system: Secondary | ICD-10-CM | POA: Diagnosis not present

## 2018-03-13 LAB — BMP8+EGFR
BUN / CREAT RATIO: 16 (ref 12–28)
BUN: 13 mg/dL (ref 8–27)
CHLORIDE: 99 mmol/L (ref 96–106)
CO2: 29 mmol/L (ref 20–29)
Calcium: 9.3 mg/dL (ref 8.7–10.3)
Creatinine, Ser: 0.82 mg/dL (ref 0.57–1.00)
GFR calc Af Amer: 82 mL/min/{1.73_m2} (ref >59)
GFR calc non Af Amer: 71 mL/min/{1.73_m2} (ref >59)
GLUCOSE: 294 mg/dL — AB (ref 65–99)
Potassium: 3.8 mmol/L (ref 3.5–5.2)
SODIUM: 142 mmol/L (ref 134–144)

## 2018-03-13 LAB — URINALYSIS
Bilirubin, UA: NEGATIVE
Ketones, UA: NEGATIVE
Leukocytes, UA: NEGATIVE
Nitrite, UA: NEGATIVE
Protein, UA: NEGATIVE
RBC UA: NEGATIVE
Specific Gravity, UA: 1.025 (ref 1.005–1.030)
Urobilinogen, Ur: 0.2 mg/dL (ref 0.2–1.0)
pH, UA: 5.5 (ref 5.0–7.5)

## 2018-03-13 LAB — BAYER DCA HB A1C WAIVED: HB A1C: 7.6 % — AB (ref <7.0)

## 2018-03-13 MED ORDER — INSULIN NPH (HUMAN) (ISOPHANE) 100 UNIT/ML ~~LOC~~ SUSP: SUBCUTANEOUS | 0 refills | Status: DC

## 2018-03-13 MED ORDER — VALACYCLOVIR HCL 1 G PO TABS: ORAL_TABLET | ORAL | 0 refills | Status: DC

## 2018-03-13 MED ORDER — ACCU-CHEK AVIVA DEVI: 0 refills | Status: AC

## 2018-03-13 MED ORDER — GLUCOSE BLOOD VI STRP: 1.0000 | ORAL_STRIP | Freq: Every day | 3 refills | Status: DC

## 2018-03-13 NOTE — Patient Instructions (Signed)
Try replens for vaginal itching

## 2018-03-13 NOTE — Progress Notes (Signed)
  Subjective:   Patient ID: Susan Petty, female    DOB: March 08, 1946, 72 y.o.   MRN: [id number] CC: Medical Management of Chronic Issues  HPI: Susan Petty is a 72 y.o. female   DM2: checks blood sugars up to 6 times a day. Has had one low BGL of 45 over the last few months, happened in the evening she thought due to change in eating habits that day.  Taking insulin regularly and as prescribed.  Vaginal irritation: No rash.  Continues to feel irritated with urination times.  Some dryness.  Replens cream may have helped in the past.  Not using regularly now.  No fevers.  No abdominal pain.  Hypertension: Taking her medicines regularly.  No dizziness when she stands up.  No chest pain or shortness of breath.  Relevant past medical, surgical, family and social history reviewed. Allergies and medications reviewed and updated. Social History   Tobacco Use  Smoking Status Never Smoker  Smokeless Tobacco Never Used   ROS: Per HPI   Objective:    BP 131/66   Pulse 66   Temp 98.6 F (37 C) (Oral)   Ht '5\' 5"'$  (1.651 m)   Wt 131 lb 12.8 oz (59.8 kg)   BMI 21.93 kg/m   Wt Readings from Last 3 Encounters:  03/13/18 131 lb 12.8 oz (59.8 kg)  12/11/17 131 lb 9.6 oz (59.7 kg)  11/27/17 133 lb 12.8 oz (60.7 kg)    Gen: NAD, alert, cooperative with exam, NCAT EYES: EOMI, no conjunctival injection, or no icterus ENT:  TMs pearly gray with small amount layering effusion b/l, OP without erythema LYMPH: no cervical LAD CV: NRRR, normal S1/S2, no murmur, distal pulses 2+ b/l Resp: CTABL, no wheezes, normal WOB Abd: +BS, soft, NTND. no guarding or organomegaly Ext: No edema, warm Neuro: Alert and oriented, strength equal b/l UE and LE, coordination grossly normal MSK: normal muscle bulk  Assessment & Plan:  Susan Petty was seen today for medical management of chronic issues.  Diagnoses and all orders for this visit:  Essential hypertension Adequate control, continue current medicines. -      BMP8+EGFR  Type 2 diabetes mellitus with insulin therapy (HCC) A1c 7.6, continue current medicines.  Discussed pros of alternate insulins, patient concerned about switching she is been on same medicines for a long time is very comfortable with them now. -     Bayer DCA Hb A1c Waived -     insulin NPH Human (HUMULIN N) 100 UNIT/ML injection; INJECT 25 UNITS EACH MORNING AND 10 UNITS EACH EVENING -     Blood Glucose Monitoring Suppl (ACCU-CHEK AVIVA) device; Use as instructed -     glucose blood (ACCU-CHEK AVIVA PLUS) test strip; 1 each by Other route 6 (six) times daily. USE TO CHECK GLUCOSE 6 TIMES DAILY  Herpes labialis Stable, continue below -     valACYclovir (VALTREX) 1000 MG tablet; 2g twice daily for 2 doses, start as soon as symptoms begin  UTI symptoms UA clean.  Suspect still with vaginal irritation.  Declines exam today.  Discussed options including referral to gynecology, retry Replens, trial of topical estrogen cream for vaginal atrophy.  Concerned about expense with estrogen cream, patient wants to try Replens first. -     Urinalysis -     Urine Culture   Follow up plan: Return in about 3 months (around 06/12/2018).  Sooner if needed. Assunta Found, MD [place]

## 2018-03-16 LAB — URINE CULTURE

## 2018-03-23 ENCOUNTER — Telehealth: Payer: Self-pay | Admitting: Pediatrics

## 2018-03-23 NOTE — Telephone Encounter (Signed)
Pt aware by detailed

## 2018-03-23 NOTE — Telephone Encounter (Signed)
Mixed normal urogenital flora grew, no specific pathogenic bacteria. No antibiotics needed.

## 2018-03-24 ENCOUNTER — Ambulatory Visit: Payer: Medicare Other | Admitting: *Deleted

## 2018-04-14 ENCOUNTER — Encounter: Payer: Self-pay | Admitting: *Deleted

## 2018-04-14 ENCOUNTER — Ambulatory Visit (INDEPENDENT_AMBULATORY_CARE_PROVIDER_SITE_OTHER): Payer: Medicare Other | Admitting: *Deleted

## 2018-04-14 VITALS — BP 146/58 | HR 56 | Ht 65.0 in | Wt 131.0 lb

## 2018-04-14 DIAGNOSIS — Z23 Encounter for immunization: Secondary | ICD-10-CM

## 2018-04-14 DIAGNOSIS — Z0000 Encounter for general adult medical examination without abnormal findings: Secondary | ICD-10-CM | POA: Diagnosis not present

## 2018-04-14 NOTE — Progress Notes (Signed)
Subjective:   Susan Petty is a 72 y.o. female who presents for an Initial Medicare Annual Wellness Visit.  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  She enjoys walking, doing word search puzzles and watching television.  She lives alone, and has 2 daughters, 6 grandchildren and a cat.  Susan Petty feels her health is unchanged from last year.  She reports no hospitalizations, ER visits, or surgeries in the past year.    Review of Systems    All negative today        Objective:    Today's Vitals   04/14/18 1011  BP: (!) 146/58  Pulse: (!) 56  Weight: 131 lb (59.4 kg)  Height: 5\' 5"  (1.651 m)  PainSc: 0-No pain   Body mass index is 21.8 kg/m.  Advanced Directives 04/14/2018 03/29/2016 09/03/2011 09/03/2011 09/03/2011  Does Patient Have a Medical Advance Directive? No No Patient does not have advance directive;Patient would not like information - Patient does not have advance directive;Patient would like information  Would patient like information on creating a medical advance directive? Yes (MAU/Ambulatory/Procedural Areas - Information given) No - patient declined information - - Advance directive packet given  Pre-existing out of facility DNR order (yellow form or pink MOST form) - - No No No    Current Medications (verified) Outpatient Encounter Medications as of 04/14/2018  Medication Sig  . alendronate (FOSAMAX) 70 MG tablet TAKE ONE TABLET BY MOUTH ONCE A WEEK WITH A FULL GLASS OF WATER ON AN EMPTY STOMACH  . AMITIZA 8 MCG capsule TAKE 1 CAPSULE BY MOUTH TWICE DAILY WITH A MEAL  . amLODipine (NORVASC) 5 MG tablet Take 1 tablet (5 mg total) by mouth daily.  Marland Kitchen aspirin EC 81 MG tablet Take 81 mg by mouth daily.  . benazepril (LOTENSIN) 40 MG tablet Take 1 tablet (40 mg total) by mouth daily.  . Blood Glucose Monitoring Suppl (ACCU-CHEK AVIVA) device Use as instructed  . cetirizine (ZYRTEC) 10 MG tablet Take 1  tablet (10 mg total) by mouth daily.  . Cholecalciferol (VITAMIN D-3 PO) Take 2,000 Units by mouth daily.  . fluticasone (FLONASE) 50 MCG/ACT nasal spray Place 2 sprays into both nostrils daily. (Patient taking differently: Place 2 sprays into both nostrils as needed. )  . glucose blood (ACCU-CHEK AVIVA PLUS) test strip USE ONE STRIP TO CHECK GLUCOSE 4 TIMES DAILY  . glucose blood (ACCU-CHEK AVIVA PLUS) test strip 1 each by Other route 6 (six) times daily. USE TO CHECK GLUCOSE 6 TIMES DAILY  . hydrochlorothiazide (HYDRODIURIL) 25 MG tablet Take 1 tablet (25 mg total) by mouth daily.  . insulin NPH Human (HUMULIN N) 100 UNIT/ML injection INJECT 25 UNITS EACH MORNING AND 10 UNITS EACH EVENING  . insulin regular (NOVOLIN R,HUMULIN R) 100 units/mL injection Inject 0.03 mLs (3 Units total) into the skin 3 (three) times daily before meals. (Patient taking differently: Inject 3 Units into the skin 3 (three) times daily before meals. Take as needed.)  . metFORMIN (GLUCOPHAGE) 500 MG tablet Take 2 tablets (1,000 mg total) by mouth 2 (two) times daily with a meal.  . metoprolol tartrate (LOPRESSOR) 25 MG tablet Take 1 tablet (25 mg total) by mouth 2 (two) times daily.  Marland Kitchen omeprazole (PRILOSEC) 40 MG capsule Take 1 capsule (40 mg total) by mouth 2 (two) times daily.  . pravastatin (PRAVACHOL) 40 MG tablet Take 1 tablet (40 mg total) by mouth  daily.  . valACYclovir (VALTREX) 1000 MG tablet 2g twice daily for 2 doses, start as soon as symptoms begin (Patient taking differently: Take 1,000 mg by mouth as needed. 2g twice daily for 2 doses, start as soon as symptoms begin)   No facility-administered encounter medications on file as of 04/14/2018.     Allergies (verified) Sulfa antibiotics and Sulfa drugs cross reactors   History: Past Medical History:  Diagnosis Date  . Diabetes mellitus   . GERD (gastroesophageal reflux disease)   . Hypertension   . Osteoporosis    Past Surgical History:  Procedure  Laterality Date  . COLONOSCOPY N/A 03/29/2016   Procedure: COLONOSCOPY;  Surgeon: Danie Binder, MD;  Location: [facility];  Service: Endoscopy;  Laterality: N/A;  10:00 am  . ESOPHAGOGASTRODUODENOSCOPY N/A 03/29/2016   Procedure: ESOPHAGOGASTRODUODENOSCOPY (EGD);  Surgeon: Danie Binder, MD;  Location: [facility];  Service: Endoscopy;  Laterality: N/A;  . TOTAL HIP ARTHROPLASTY  09/03/2011   Procedure: TOTAL HIP ARTHROPLASTY;  Surgeon: Mauri Pole, MD;  Location: [facility];  Service: Orthopedics;  Laterality: Left;   Family History  Problem Relation Age of Onset  . Hypertension Mother   . Hypertension Father   . Diabetes Father   . Hypertension Sister   . Cancer Sister   . Diabetes Brother   . Diabetes Brother   . Colon cancer Neg Hx    Social History   Socioeconomic History  . Marital status: Widowed    Spouse name: Not on file  . Number of children: Not on file  . Years of education: Not on file  . Highest education level: Not on file  Occupational History  . Not on file  Social Needs  . Financial resource strain: Not on file  . Food insecurity:    Worry: Not on file    Inability: Not on file  . Transportation needs:    Medical: Not on file    Non-medical: Not on file  Tobacco Use  . Smoking status: Never Smoker  . Smokeless tobacco: Never Used  Substance and Sexual Activity  . Alcohol use: Yes    Comment: occasional wine  . Drug use: No  . Sexual activity: Not on file  Lifestyle  . Physical activity:    Days per week: Not on file    Minutes per session: Not on file  . Stress: Not on file  Relationships  . Social connections:    Talks on phone: Not on file    Gets together: Not on file    Attends religious service: Not on file    Active member of club or organization: Not on file    Attends meetings of clubs or organizations: Not on file    Relationship status: Not on file  Other Topics Concern  . Not on file  Social History Narrative  . Not on  file    Tobacco Counseling Counseling given: No   Clinical Intake:     Pain Score: 0-No pain                  Activities of Daily Living In your present state of health, do you have any difficulty performing the following activities: 04/14/2018  Hearing? N  Vision? N  Difficulty concentrating or making decisions? N  Walking or climbing stairs? N  Dressing or bathing? N  Doing errands, shopping? N  Preparing Food and eating ? N  Using the Toilet? N  In the past  six months, have you accidently leaked urine? N  Do you have problems with loss of bowel control? N  Managing your Medications? N  Managing your Finances? N  Housekeeping or managing your Housekeeping? N  Some recent data might be hidden     Immunizations and Health Maintenance Immunization History  Administered Date(s) Administered  . Influenza, High Dose Seasonal PF 05/05/2013, 04/08/2017, 04/14/2018  . Influenza,inj,Quad PF,6+ Mos 04/13/2014  . Influenza-Unspecified 04/20/2012  . Pneumococcal Conjugate-13 03/14/2015  . Pneumococcal Polysaccharide-23 04/14/2018   Health Maintenance Due  Topic Date Due  . OPHTHALMOLOGY EXAM  08/19/2015  . MAMMOGRAM  02/04/2018  . INFLUENZA VACCINE  02/05/2018    Patient Care Team: Eustaquio Maize, MD as PCP - General (Pediatrics) Danie Binder, MD as Consulting Physician (Gastroenterology)      Assessment:   This is a routine wellness examination for Susan Petty.  Hearing/Vision screen No hearing deficit noted. Patient goes to [place] for her eye exams.  She is aware that she is due for an eye exam.  She plans to call to schedule within the next few weeks.  Asked her to have results sent to [facility].    Dietary issues and exercise activities discussed:  Patient states she eats 2 meals and a snack per day.  She usually has eggs, bacon, and toast for breakfast, peanut butter crackers for lunch, and vegetables, sandwich, or soup for supper.     Recommended a diet of mostly lean proteins, vegetables, and whole grains and fruits in moderation.  Current Exercise Habits: Home exercise routine, Type of exercise: walking, Time (Minutes): 20, Frequency (Times/Week): 2, Weekly Exercise (Minutes/Week): 40, Intensity: Mild  Goals    . Decrease screen time to less then 3 hours per day      Depression Screen PHQ 2/9 Scores 04/14/2018 03/13/2018 12/11/2017 08/28/2017 05/23/2017 04/21/2017 11/28/2016  PHQ - 2 Score 2 0 0 0 0 0 0  PHQ- 9 Score 4 - - - - - -    Fall Risk Fall Risk  04/14/2018 03/13/2018 12/11/2017 08/28/2017 05/23/2017  Falls in the past year? No No No No No    Is the patient's home free of loose throw rugs in walkways, pet beds, electrical cords, etc?   yes      Grab bars in the bathroom? no      Handrails on the stairs?   no stairs in home      Adequate lighting?   yes    Cognitive Function: MMSE - Mini Mental State Exam 04/14/2018  Orientation to time 5  Orientation to Place 5  Registration 3  Attention/ Calculation 5  Recall 2  Language- name 2 objects 2  Language- repeat 1  Language- follow 3 step command 3  Language- read & follow direction 1  Write a sentence 1  Copy design 1  Total score 29        Screening Tests Health Maintenance  Topic Date Due  . OPHTHALMOLOGY EXAM  08/19/2015  . MAMMOGRAM  02/04/2018  . INFLUENZA VACCINE  02/05/2018  . PNA vac Low Risk Adult (2 of 2 - PPSV23) 05/23/2018 (Originally 03/13/2016)  . HEMOGLOBIN A1C  09/11/2018  . FOOT EXAM  12/12/2018  . TETANUS/TDAP  05/16/2022  . COLONOSCOPY  03/29/2026  . DEXA SCAN  Completed  . Hepatitis C Screening  Completed  . PAP SMEAR  Discontinued   Pneumovax 23 and flu vaccines given today Mammogram scheduled for 05/19/18 Patient plans to schedule  eye exam in the next couple of weeks.   Cancer Screenings: Lung: Low Dose CT Chest recommended if Age 22-80 years, 30 pack-year currently smoking OR have quit w/in 15years. Patient does not  qualify. Breast: Up to date on Mammogram? No, scheduled for 05/19/18 Up to date of Bone Density/Dexa? Yes Colorectal: up to date  Additional Screenings:  Hepatitis C Screening:  Completed 06/29/2015     Plan:     Work on your goal of decreasing your TV watching time to less than 3 hours per day. Remember to schedule your eye exam.  Review the information given on Advance Directives, and if you complete these please bring a copy to our office to be filed in your medical record.  Follow up with Dr. Evette Doffing as scheduled.   I have personally reviewed and noted the following in the patient's chart:   . Medical and social history . Use of alcohol, tobacco or illicit drugs  . Current medications and supplements . Functional ability and status . Nutritional status . Physical activity . Advanced directives . List of other physicians . Hospitalizations, surgeries, and ER visits in previous 12 months . Vitals . Screenings to include cognitive, depression, and falls . Referrals and appointments  In addition, I have reviewed and discussed with patient certain preventive protocols, quality metrics, and best practice recommendations. A written personalized care plan for preventive services as well as general preventive health recommendations were provided to patient.     Kaori Jumper M, RN   04/14/2018    I have reviewed and agree with the above AWV documentation.  Claretta Fraise, M.D.

## 2018-04-14 NOTE — Patient Instructions (Addendum)
Please work on your goal of decreasing your TV watching time to less than 3 hours per day.  Remember to schedule your eye exam.   Please review the information given on Advance Directives, and if you complete these please bring a copy to our office to be filed in your medical record.   You received your Pneumovax 23 (pneumonia vaccine) and Flu vaccine today.  Please follow up with Dr. Evette Doffing as scheduled.  Thank you for coming in for your Annual Wellness Visit today!     Preventive Care 74 Years and Older, Female Preventive care refers to lifestyle choices and visits with your health care provider that can promote health and wellness. What does preventive care include?  A yearly physical exam. This is also called an annual well check.  Dental exams once or twice a year.  Routine eye exams. Ask your health care provider how often you should have your eyes checked.  Personal lifestyle choices, including: ? Daily care of your teeth and gums. ? Regular physical activity. ? Eating a healthy diet. ? Avoiding tobacco and drug use. ? Limiting alcohol use. ? Practicing safe sex. ? Taking low-dose aspirin every day. ? Taking vitamin and mineral supplements as recommended by your health care provider. What happens during an annual well check? The services and screenings done by your health care provider during your annual well check will depend on your age, overall health, lifestyle risk factors, and family history of disease. Counseling Your health care provider may ask you questions about your:  Alcohol use.  Tobacco use.  Drug use.  Emotional well-being.  Home and relationship well-being.  Sexual activity.  Eating habits.  History of falls.  Memory and ability to understand (cognition).  Work and work Statistician.  Reproductive health.  Screening You may have the following tests or measurements:  Height, weight, and BMI.  Blood pressure.  Lipid and  cholesterol levels. These may be checked every 5 years, or more frequently if you are over 81 years old.  Skin check.  Lung cancer screening. You may have this screening every year starting at age 20 if you have a 30-pack-year history of smoking and currently smoke or have quit within the past 15 years.  Fecal occult blood test (FOBT) of the stool. You may have this test every year starting at age 34.  Flexible sigmoidoscopy or colonoscopy. You may have a sigmoidoscopy every 5 years or a colonoscopy every 10 years starting at age 38.  Hepatitis C blood test.  Hepatitis B blood test.  Sexually transmitted disease (STD) testing.  Diabetes screening. This is done by checking your blood sugar (glucose) after you have not eaten for a while (fasting). You may have this done every 1-3 years.  Bone density scan. This is done to screen for osteoporosis. You may have this done starting at age 2.  Mammogram. This may be done every 1-2 years. Talk to your health care provider about how often you should have regular mammograms.  Talk with your health care provider about your test results, treatment options, and if necessary, the need for more tests. Vaccines Your health care provider may recommend certain vaccines, such as:  Influenza vaccine. This is recommended every year.  Tetanus, diphtheria, and acellular pertussis (Tdap, Td) vaccine. You may need a Td booster every 10 years.  Varicella vaccine. You may need this if you have not been vaccinated.  Zoster vaccine. You may need this after age 37.  Measles, mumps, and  rubella (MMR) vaccine. You may need at least one dose of MMR if you were born in 1957 or later. You may also need a second dose.  Pneumococcal 13-valent conjugate (PCV13) vaccine. One dose is recommended after age 60.  Pneumococcal polysaccharide (PPSV23) vaccine. One dose is recommended after age 69.  Meningococcal vaccine. You may need this if you have certain  conditions.  Hepatitis A vaccine. You may need this if you have certain conditions or if you travel or work in places where you may be exposed to hepatitis A.  Hepatitis B vaccine. You may need this if you have certain conditions or if you travel or work in places where you may be exposed to hepatitis B.  Haemophilus influenzae type b (Hib) vaccine. You may need this if you have certain conditions.  Talk to your health care provider about which screenings and vaccines you need and how often you need them. This information is not intended to replace advice given to you by your health care provider. Make sure you discuss any questions you have with your health care provider. Document Released: 07/21/2015 Document Revised: 03/13/2016 Document Reviewed: 04/25/2015 Elsevier Interactive Patient Education  [employer].

## 2018-04-16 ENCOUNTER — Telehealth: Payer: Self-pay | Admitting: Gastroenterology

## 2018-04-16 NOTE — Telephone Encounter (Signed)
PATIENT STATED HER PHARMACY SENT A REFILL REQUEST OVER Tuesday FOR HER OMEPRAZOLE AND THEY HAVE NOT RECEIVED IT BACK   PLEASE ADVISE

## 2018-04-16 NOTE — Telephone Encounter (Signed)
Pt would like a refill on her Omeprazole 40 mg bid sent to [facility] in [place].

## 2018-04-17 ENCOUNTER — Other Ambulatory Visit: Payer: Self-pay | Admitting: Nurse Practitioner

## 2018-04-17 DIAGNOSIS — K219 Gastro-esophageal reflux disease without esophagitis: Secondary | ICD-10-CM

## 2018-04-17 DIAGNOSIS — K5900 Constipation, unspecified: Secondary | ICD-10-CM

## 2018-04-20 ENCOUNTER — Telehealth: Payer: Self-pay | Admitting: Gastroenterology

## 2018-04-20 NOTE — Telephone Encounter (Signed)
Patient l/m that hwe prescription was supposed to be sent in last week and when she called them Saturday the refill had not been called in

## 2018-04-20 NOTE — Telephone Encounter (Signed)
PT is aware the RX was sent in on 04/18/2018 and received by pharmacy. She will check with them today and let me know if there is a problem.

## 2018-05-14 ENCOUNTER — Telehealth: Payer: Self-pay | Admitting: *Deleted

## 2018-05-14 DIAGNOSIS — Z794 Long term (current) use of insulin: Principal | ICD-10-CM

## 2018-05-14 DIAGNOSIS — E119 Type 2 diabetes mellitus without complications: Secondary | ICD-10-CM

## 2018-05-14 MED ORDER — INSULIN NPH (HUMAN) (ISOPHANE) 100 UNIT/ML ~~LOC~~ SUSP: SUBCUTANEOUS | 0 refills | Status: DC

## 2018-05-14 NOTE — Telephone Encounter (Signed)
Pt aware refill sent to pharmacy 

## 2018-05-16 ENCOUNTER — Other Ambulatory Visit: Payer: Self-pay | Admitting: Pediatrics

## 2018-05-16 DIAGNOSIS — Z794 Long term (current) use of insulin: Principal | ICD-10-CM

## 2018-05-16 DIAGNOSIS — E119 Type 2 diabetes mellitus without complications: Secondary | ICD-10-CM

## 2018-05-19 ENCOUNTER — Ambulatory Visit (INDEPENDENT_AMBULATORY_CARE_PROVIDER_SITE_OTHER): Payer: Medicare Other | Admitting: Family

## 2018-05-19 ENCOUNTER — Ambulatory Visit: Payer: Medicare Other | Admitting: Nurse Practitioner

## 2018-05-19 ENCOUNTER — Encounter: Payer: Self-pay | Admitting: Family

## 2018-05-19 VITALS — BP 129/56 | HR 56 | Temp 97.5°F | Ht 65.0 in | Wt 132.4 lb

## 2018-05-19 DIAGNOSIS — J0100 Acute maxillary sinusitis, unspecified: Secondary | ICD-10-CM

## 2018-05-19 DIAGNOSIS — Z1231 Encounter for screening mammogram for malignant neoplasm of breast: Secondary | ICD-10-CM | POA: Diagnosis not present

## 2018-05-19 LAB — HM MAMMOGRAPHY

## 2018-05-19 MED ORDER — AMOXICILLIN-POT CLAVULANATE 875-125 MG PO TABS: 1.0000 | ORAL_TABLET | Freq: Two times a day (BID) | ORAL | 0 refills | Status: DC

## 2018-05-19 NOTE — Patient Instructions (Signed)

## 2018-05-19 NOTE — Progress Notes (Signed)
   Subjective:    Patient ID: Susan Petty, female    DOB: 09-05-1945, 72 y.o.   MRN: [id number]  Chief Complaint  Patient presents with  . Sinus Problem    Sinusitis  This is a new problem. The current episode started 1 to 4 weeks ago. The problem has been gradually worsening since onset. There has been no fever. Her pain is at a severity of 5/10. The pain is moderate. Associated symptoms include congestion, coughing (at night), headaches, a hoarse voice, sinus pressure and sneezing. Pertinent negatives include no ear pain or sore throat. Past treatments include oral decongestants. The treatment provided mild relief.      Review of Systems  HENT: Positive for congestion, hoarse voice, sinus pressure and sneezing. Negative for ear pain and sore throat.   Respiratory: Positive for cough (at night).   Neurological: Positive for headaches.  All other systems reviewed and are negative.      Objective:   Physical Exam  Constitutional: She is oriented to person, place, and time. She appears well-developed and well-nourished. No distress.  HENT:  Head: Normocephalic and atraumatic.  Right Ear: External ear normal.  Left Ear: External ear normal.  Nose: Mucosal edema and rhinorrhea present. Right sinus exhibits maxillary sinus tenderness. Left sinus exhibits maxillary sinus tenderness.  Mouth/Throat: Oropharynx is clear and moist.  Eyes: Pupils are equal, round, and reactive to light.  Neck: Normal range of motion. Neck supple. No thyromegaly present.  Cardiovascular: Normal rate, regular rhythm, normal heart sounds and intact distal pulses.  No murmur heard. Pulmonary/Chest: Effort normal and breath sounds normal. No respiratory distress. She has no wheezes.  Abdominal: Soft. Bowel sounds are normal. She exhibits no distension. There is no tenderness.  Musculoskeletal: Normal range of motion. She exhibits no edema or tenderness.  Neurological: She is alert and oriented to person,  place, and time. She has normal reflexes. No cranial nerve deficit.  Skin: Skin is warm and dry.  Psychiatric: She has a normal mood and affect. Her behavior is normal. Judgment and thought content normal.  Vitals reviewed.     BP (!) 129/56   Pulse (!) 56   Temp (!) 97.5 F (36.4 C) (Oral)   Ht 5\' 5"  (1.651 m)   Wt 132 lb 6.4 oz (60.1 kg)   BMI 22.03 kg/m      Assessment & Plan:  Susan Petty comes in today with chief complaint of Sinus Problem   Diagnosis and orders addressed:  1. Acute maxillary sinusitis, recurrence not specified - Take meds as prescribed - Use a cool mist humidifier  -Use saline nose sprays frequently -Force fluids -For any cough or congestion  Use plain Mucinex- regular strength or max strength is fine -For fever or aces or pains- take tylenol or ibuprofen. -Throat lozenges if help -RTO if symptoms worsen or do not improve  - amoxicillin-clavulanate (AUGMENTIN) 875-125 MG tablet; Take 1 tablet by mouth 2 (two) times daily.  Dispense: 14 tablet; Refill: 0   Susan Dun, FNP

## 2018-06-12 ENCOUNTER — Encounter: Payer: Self-pay | Admitting: Pediatrics

## 2018-06-12 ENCOUNTER — Ambulatory Visit (INDEPENDENT_AMBULATORY_CARE_PROVIDER_SITE_OTHER): Payer: Medicare Other | Admitting: Pediatrics

## 2018-06-12 VITALS — BP 130/63 | HR 64 | Temp 97.4°F | Ht 65.0 in | Wt 131.6 lb

## 2018-06-12 DIAGNOSIS — J309 Allergic rhinitis, unspecified: Secondary | ICD-10-CM

## 2018-06-12 DIAGNOSIS — M810 Age-related osteoporosis without current pathological fracture: Secondary | ICD-10-CM

## 2018-06-12 DIAGNOSIS — B001 Herpesviral vesicular dermatitis: Secondary | ICD-10-CM

## 2018-06-12 DIAGNOSIS — E785 Hyperlipidemia, unspecified: Secondary | ICD-10-CM

## 2018-06-12 DIAGNOSIS — I10 Essential (primary) hypertension: Secondary | ICD-10-CM

## 2018-06-12 DIAGNOSIS — E119 Type 2 diabetes mellitus without complications: Secondary | ICD-10-CM

## 2018-06-12 DIAGNOSIS — M8000XD Age-related osteoporosis with current pathological fracture, unspecified site, subsequent encounter for fracture with routine healing: Secondary | ICD-10-CM | POA: Diagnosis not present

## 2018-06-12 DIAGNOSIS — E7849 Other hyperlipidemia: Secondary | ICD-10-CM | POA: Diagnosis not present

## 2018-06-12 DIAGNOSIS — Z794 Long term (current) use of insulin: Secondary | ICD-10-CM | POA: Diagnosis not present

## 2018-06-12 DIAGNOSIS — B379 Candidiasis, unspecified: Secondary | ICD-10-CM

## 2018-06-12 DIAGNOSIS — H65191 Other acute nonsuppurative otitis media, right ear: Secondary | ICD-10-CM

## 2018-06-12 DIAGNOSIS — H65111 Acute and subacute allergic otitis media (mucoid) (sanguinous) (serous), right ear: Secondary | ICD-10-CM

## 2018-06-12 LAB — BAYER DCA HB A1C WAIVED: HB A1C (BAYER DCA - WAIVED): 7.4 % — ABNORMAL HIGH (ref <7.0)

## 2018-06-12 MED ORDER — METFORMIN HCL 500 MG PO TABS: 1000.0000 mg | ORAL_TABLET | Freq: Two times a day (BID) | ORAL | 1 refills | Status: DC

## 2018-06-12 MED ORDER — AMLODIPINE BESYLATE 5 MG PO TABS: 5.0000 mg | ORAL_TABLET | Freq: Every day | ORAL | 1 refills | Status: DC

## 2018-06-12 MED ORDER — FLUTICASONE PROPIONATE 50 MCG/ACT NA SUSP: 2.0000 | Freq: Every day | NASAL | 6 refills | Status: DC

## 2018-06-12 MED ORDER — PRAVASTATIN SODIUM 40 MG PO TABS: 40.0000 mg | ORAL_TABLET | Freq: Every day | ORAL | 1 refills | Status: DC

## 2018-06-12 MED ORDER — AZITHROMYCIN 250 MG PO TABS: ORAL_TABLET | ORAL | 0 refills | Status: DC

## 2018-06-12 MED ORDER — HYDROCHLOROTHIAZIDE 25 MG PO TABS: 25.0000 mg | ORAL_TABLET | Freq: Every day | ORAL | 1 refills | Status: DC

## 2018-06-12 MED ORDER — BENAZEPRIL HCL 40 MG PO TABS: 40.0000 mg | ORAL_TABLET | Freq: Every day | ORAL | 1 refills | Status: DC

## 2018-06-12 MED ORDER — VALACYCLOVIR HCL 1 G PO TABS: 1000.0000 mg | ORAL_TABLET | ORAL | 2 refills | Status: DC | PRN

## 2018-06-12 MED ORDER — METOPROLOL TARTRATE 25 MG PO TABS: 25.0000 mg | ORAL_TABLET | Freq: Two times a day (BID) | ORAL | 1 refills | Status: DC

## 2018-06-12 MED ORDER — FLUCONAZOLE 150 MG PO TABS: 150.0000 mg | ORAL_TABLET | ORAL | 0 refills | Status: DC | PRN

## 2018-06-12 MED ORDER — INSULIN REGULAR HUMAN 100 UNIT/ML IJ SOLN: 3.0000 [IU] | Freq: Three times a day (TID) | INTRAMUSCULAR | 1 refills | Status: DC

## 2018-06-12 MED ORDER — INSULIN NPH (HUMAN) (ISOPHANE) 100 UNIT/ML ~~LOC~~ SUSP: SUBCUTANEOUS | 2 refills | Status: DC

## 2018-06-12 NOTE — Progress Notes (Signed)
Subjective:   Patient ID: Susan Petty, female    DOB: July 21, 1945, 72 y.o.   MRN: [id number] CC: Medical Management of Chronic Issues (3 month)  HPI: Susan Petty is a 72 y.o. female   Diabetes: Taking NPH twice a day, his regular insulin before meals.  She says she is really taking the regular insulin unless it is elevated prior to a meal.  She decreased the NPH dose in the evening because she has had a few lows in the morning.  She denies any lows for the last few weeks.  Congestion and ear pressure: Ongoing symptoms for over a week.  No fevers.  Right ear bothering her regularly  Hypertension: Taking blood pressure medicine regularly.  No lightheadedness or dizziness.  No chest pain with exertion.  Hyperlipidemia: Tolerating statin, no side effects  Osteoporosis: Taking alendronate regularly.  Herpes labialis: Asked for refill of Valtrex  Relevant past medical, surgical, family and social history reviewed. Allergies and medications reviewed and updated. Social History   Tobacco Use  Smoking Status Never Smoker  Smokeless Tobacco Never Used   ROS: Per HPI   Objective:    BP 130/63   Pulse 64   Temp (!) 97.4 F (36.3 C) (Oral)   Ht _0  (1.651 m)   Wt 131 lb 9.6 oz (59.7 kg)   BMI 21.90 kg/m   Wt Readings from Last 3 Encounters:  06/12/18 131 lb 9.6 oz (59.7 kg)  05/19/18 132 lb 6.4 oz (60.1 kg)  04/14/18 131 lb (59.4 kg)    Gen: NAD, alert, cooperative with exam, NCAT EYES: EOMI, no conjunctival injection, or no icterus ENT: Right TM pink, white effusion, left TM pearly gray, OP without erythema LYMPH: no cervical LAD CV: NRRR, normal S1/S2, no murmur, distal pulses 2+ b/l Resp: CTABL, no wheezes, normal WOB Abd: +BS, soft, NTND. no guarding or organomegaly Ext: No edema, warm Neuro: Alert and oriented, strength equal b/l UE and LE, coordination grossly normal  MSK: normal muscle bulk  Assessment & Plan:  Valen was seen today for medical management of  chronic issues.  Diagnoses and all orders for this visit:  Type 2 diabetes mellitus with insulin therapy (HCC) A1c 7.4.  Has declined switch to long-acting insulin and short acting insulin in the past.  May still be beneficial to consider switching the future.  Continue current medicines. -     BMP8+EGFR -     Lipid panel -     Microalbumin / creatinine urine ratio -     Bayer DCA Hb A1c Waived -     metFORMIN (GLUCOPHAGE) 500 MG tablet; Take 2 tablets (1,000 mg total) by mouth 2 (two) times daily with a meal. -     insulin regular (NOVOLIN R,HUMULIN R) 100 units/mL injection; Inject 0.03 mLs (3 Units total) into the skin 3 (three) times daily before meals. -     insulin NPH Human (HUMULIN N) 100 UNIT/ML injection; INJECT 18 UNITS EACH MORNING AND 7 UNITS EACH EVENING  Essential hypertension Stable, continue current medicines -     BMP8+EGFR -     Lipid panel -     Microalbumin / creatinine urine ratio -     Bayer DCA Hb A1c Waived -     metoprolol tartrate (LOPRESSOR) 25 MG tablet; Take 1 tablet (25 mg total) by mouth 2 (two) times daily. -     hydrochlorothiazide (HYDRODIURIL) 25 MG tablet; Take 1 tablet (25 mg total) by mouth daily. -  benazepril (LOTENSIN) 40 MG tablet; Take 1 tablet (40 mg total) by mouth daily. -     amLODipine (NORVASC) 5 MG tablet; Take 1 tablet (5 mg total) by mouth daily.  Osteoporosis with pathological fracture, with routine healing, subsequent encounter Stable, continue alendronate -     BMP8+EGFR -     Lipid panel -     Microalbumin / creatinine urine ratio -     Bayer DCA Hb A1c Waived  Hyperlipidemia, unspecified hyperlipidemia type Stable, continue pravastatin -     BMP8+EGFR -     Lipid panel -     Microalbumin / creatinine urine ratio -     Bayer DCA Hb A1c Waived -     pravastatin (PRAVACHOL) 40 MG tablet; Take 1 tablet (40 mg total) by mouth daily.  Herpes labialis Stable, continue below as needed -     valACYclovir (VALTREX) 1000 MG  tablet; Take 1 tablet (1,000 mg total) by mouth as needed. 2g twice daily for 2 doses, start as soon as symptoms begin  Acute mucoid otitis media of right ear Symptomatic care discussed.  Treat with below. -     azithromycin (ZITHROMAX) 250 MG tablet; Take 2 the first day and then one each day after.  Infection due to yeast Often gets yeast infections with antibiotics. -     fluconazole (DIFLUCAN) 150 MG tablet; Take 1 tablet (150 mg total) by mouth every three (3) days as needed.  Allergic rhinitis -     fluticasone (FLONASE) 50 MCG/ACT nasal spray; Place 2 sprays into both nostrils daily.   Follow up plan: Return in about 3 months (around 09/11/2018) for DM2. Assunta Found, MD [place]

## 2018-06-12 NOTE — Patient Instructions (Signed)
Fever reducer and headache: tylenol and ibuprofen, can take together or alternating   Sinus pressure:  Nasal steroid such as flonase/fluticaone or nasocort daily Can also take daily antihistamine such as loratadine/claritin or cetirizine/zyrtec  Sinus rinses/irritation: Netipot or similar with distilled water 2-3 times a day to clear out sinuses or Normal saline nasal spray  Sore throat:  Throat lozenges chloroseptic spray  Stick with bland foods Drink lots of fluids

## 2018-06-13 ENCOUNTER — Telehealth: Payer: Self-pay | Admitting: Family Medicine

## 2018-06-13 LAB — MICROALBUMIN / CREATININE URINE RATIO
CREATININE, UR: 13.2 mg/dL
Microalb/Creat Ratio: <22.7 mg/g creat (ref 0.0–30.0)
Microalbumin, Urine: <3 ug/mL

## 2018-06-13 NOTE — Telephone Encounter (Signed)
Discussed elevated glucose (401) with pt. She anticipated this. It has been high due to sinus infection recently. Was over 300 when seen in office yesterday. Coming down at home today. She denies any symptoms related to elevated glucose.  I suggesting increasing mealtime insulin by 1-2 units until glucose drops below 200.  Claretta Fraise, M.D.

## 2018-06-16 LAB — BMP8+EGFR
BUN/Creatinine Ratio: 15 (ref 12–28)
BUN: 12 mg/dL (ref 8–27)
CALCIUM: 9 mg/dL (ref 8.7–10.3)
CO2: 25 mmol/L (ref 20–29)
Chloride: 92 mmol/L — ABNORMAL LOW (ref 96–106)
Creatinine, Ser: 0.81 mg/dL (ref 0.57–1.00)
GFR calc Af Amer: 83 mL/min/{1.73_m2} (ref >59)
GFR calc non Af Amer: 72 mL/min/{1.73_m2} (ref >59)
GLUCOSE: 401 mg/dL — AB (ref 65–99)
Potassium: 4 mmol/L (ref 3.5–5.2)
SODIUM: 135 mmol/L (ref 134–144)

## 2018-06-16 LAB — LIPID PANEL
CHOLESTEROL TOTAL: 148 mg/dL (ref 100–199)
Chol/HDL Ratio: 1.9 ratio (ref 0.0–4.4)
HDL: 79 mg/dL (ref >39)
LDL CALC: 55 mg/dL (ref 0–99)
TRIGLYCERIDES: 70 mg/dL (ref 0–149)
VLDL Cholesterol Cal: 14 mg/dL (ref 5–40)

## 2018-06-22 ENCOUNTER — Other Ambulatory Visit: Payer: Self-pay | Admitting: Pediatrics

## 2018-06-22 DIAGNOSIS — E119 Type 2 diabetes mellitus without complications: Secondary | ICD-10-CM

## 2018-06-22 DIAGNOSIS — Z794 Long term (current) use of insulin: Principal | ICD-10-CM

## 2018-06-25 DIAGNOSIS — J310 Chronic rhinitis: Secondary | ICD-10-CM | POA: Diagnosis not present

## 2018-07-14 ENCOUNTER — Ambulatory Visit (INDEPENDENT_AMBULATORY_CARE_PROVIDER_SITE_OTHER): Payer: Medicare Other | Admitting: Family

## 2018-07-14 ENCOUNTER — Encounter: Payer: Self-pay | Admitting: Family

## 2018-07-14 VITALS — BP 160/68 | HR 62 | Temp 97.1°F | Ht 65.0 in | Wt 134.0 lb

## 2018-07-14 DIAGNOSIS — H66002 Acute suppurative otitis media without spontaneous rupture of ear drum, left ear: Secondary | ICD-10-CM

## 2018-07-14 MED ORDER — AMOXICILLIN-POT CLAVULANATE 875-125 MG PO TABS: 1.0000 | ORAL_TABLET | Freq: Two times a day (BID) | ORAL | 0 refills | Status: DC

## 2018-07-14 NOTE — Progress Notes (Signed)
Subjective:    Patient ID: Susan Petty, female    DOB: 1946/01/31, 73 y.o.   MRN: [id number]  Chief Complaint  Patient presents with  . Ear Pain    Otalgia   There is pain in the left ear. This is a new problem. The current episode started in the past 7 days. The problem occurs constantly. The problem has been unchanged. There has been no fever. The pain is at a severity of 10/10. The pain is moderate. Associated symptoms include coughing (at night), headaches, rhinorrhea and a sore throat. Pertinent negatives include no ear discharge or hearing loss. She has tried acetaminophen for the symptoms. The treatment provided mild relief.      Review of Systems  HENT: Positive for ear pain, rhinorrhea and sore throat. Negative for ear discharge and hearing loss.   Respiratory: Positive for cough (at night).   Neurological: Positive for headaches.  All other systems reviewed and are negative.      Objective:   Physical Exam Vitals signs reviewed.  Constitutional:      General: She is not in acute distress.    Appearance: She is well-developed.  HENT:     Head: Normocephalic and atraumatic.     Left Ear: Tenderness present. Tympanic membrane is erythematous (mildly).     Nose:     Right Turbinates: Swollen.     Left Turbinates: Swollen.     Mouth/Throat:     Pharynx: Posterior oropharyngeal erythema present.  Eyes:     Pupils: Pupils are equal, round, and reactive to light.  Neck:     Musculoskeletal: Normal range of motion and neck supple.     Thyroid: No thyromegaly.  Cardiovascular:     Rate and Rhythm: Normal rate and regular rhythm.     Heart sounds: Normal heart sounds. No murmur.  Pulmonary:     Effort: Pulmonary effort is normal. No respiratory distress.     Breath sounds: Normal breath sounds. No wheezing.  Abdominal:     General: Bowel sounds are normal. There is no distension.     Palpations: Abdomen is soft.     Tenderness: There is no abdominal tenderness.    Musculoskeletal: Normal range of motion.        General: No tenderness.  Skin:    General: Skin is warm and dry.  Neurological:     Mental Status: She is alert and oriented to person, place, and time.     Cranial Nerves: No cranial nerve deficit.     Deep Tendon Reflexes: Reflexes are normal and symmetric.  Psychiatric:        Behavior: Behavior normal.        Thought Content: Thought content normal.        Judgment: Judgment normal.       BP (!) 160/68   Pulse 62   Temp (!) 97.1 F (36.2 C) (Oral)   Ht 5\' 5"  (1.651 m)   Wt 134 lb (60.8 kg)   BMI 22.30 kg/m      Assessment & Plan:  Susan Petty comes in today with chief complaint of Ear Pain   Diagnosis and orders addressed:  1. Non-recurrent acute suppurative otitis media of left ear without spontaneous rupture of tympanic membrane - Take meds as prescribed - Use a cool mist humidifier  -Use saline nose sprays frequently -Force fluids -For any cough or congestion  Use plain Mucinex- regular strength or max strength is fine -For fever or  aces or pains- take tylenol or ibuprofen. -Throat lozenges if help -RTO if symptoms worsen or do not improve - amoxicillin-clavulanate (AUGMENTIN) 875-125 MG tablet; Take 1 tablet by mouth 2 (two) times daily.  Dispense: 20 tablet; Refill: 0   Evelina Dun, FNP

## 2018-07-14 NOTE — Patient Instructions (Signed)

## 2018-08-26 ENCOUNTER — Ambulatory Visit (INDEPENDENT_AMBULATORY_CARE_PROVIDER_SITE_OTHER): Payer: Medicare Other | Admitting: Physician Assistant

## 2018-08-26 ENCOUNTER — Encounter: Payer: Self-pay | Admitting: Physician Assistant

## 2018-08-26 VITALS — BP 136/56 | HR 61 | Temp 98.2°F | Ht 65.0 in | Wt 134.0 lb

## 2018-08-26 DIAGNOSIS — J0110 Acute frontal sinusitis, unspecified: Secondary | ICD-10-CM

## 2018-08-26 MED ORDER — AMOXICILLIN 500 MG PO CAPS: 500.0000 mg | ORAL_CAPSULE | Freq: Three times a day (TID) | ORAL | 1 refills | Status: DC

## 2018-08-27 NOTE — Progress Notes (Signed)
BP (!) 136/56   Pulse 61   Temp 98.2 F (36.8 C) (Oral)   Ht 5\' 5"  (1.651 m)   Wt 134 lb (60.8 kg)   BMI 22.30 kg/m    Subjective:    Patient ID: Susan Petty, female    DOB: [birth year], 73 y.o.   MRN: [id number]  HPI: Susan Petty is a 73 y.o. female presenting on 08/26/2018 for Sinusitis and Cough  This patient has had many days of sinus headache and postnasal drainage. There is copious drainage at times. Denies any fever at this time. There has been a history of sinus infections in the past.  No history of sinus surgery. There is cough at night. It has become more prevalent in recent days.   Past Medical History:  Diagnosis Date  . Diabetes mellitus   . GERD (gastroesophageal reflux disease)   . Hypertension   . Osteoporosis    Relevant past medical, surgical, family and social history reviewed and updated as indicated. Interim medical history since our last visit reviewed. Allergies and medications reviewed and updated. DATA REVIEWED: CHART IN EPIC  Family History reviewed for pertinent findings.  Review of Systems  Constitutional: Positive for chills and fatigue. Negative for activity change and appetite change.  HENT: Positive for congestion, postnasal drip, sinus pressure, sinus pain and sore throat.   Eyes: Negative.   Respiratory: Positive for cough. Negative for wheezing.   Cardiovascular: Negative.  Negative for chest pain, palpitations and leg swelling.  Gastrointestinal: Negative.   Genitourinary: Negative.   Musculoskeletal: Negative.   Skin: Negative.   Neurological: Positive for headaches.    Allergies as of 08/26/2018      Reactions   Sulfa Antibiotics    Sulfa Drugs Cross Reactors Rash      Medication List       Accurate as of August 26, 2018 11:59 PM. Always use your most recent med list.        ACCU-CHEK AVIVA device Use as instructed   alendronate 70 MG tablet Commonly known as:  FOSAMAX TAKE ONE TABLET BY MOUTH ONCE A WEEK  WITH A FULL GLASS OF WATER ON AN EMPTY STOMACH   AMITIZA 8 MCG capsule Generic drug:  lubiprostone TAKE 1 CAPSULE BY MOUTH TWICE DAILY WITH A MEAL   amLODipine 5 MG tablet Commonly known as:  NORVASC Take 1 tablet (5 mg total) by mouth daily.   amoxicillin 500 MG capsule Commonly known as:  AMOXIL Take 1 capsule (500 mg total) by mouth 3 (three) times daily.   aspirin EC 81 MG tablet Take 81 mg by mouth daily.   benazepril 40 MG tablet Commonly known as:  LOTENSIN Take 1 tablet (40 mg total) by mouth daily.   fluticasone 50 MCG/ACT nasal spray Commonly known as:  FLONASE Place 2 sprays into both nostrils daily.   glucose blood test strip Commonly known as:  ACCU-CHEK AVIVA PLUS USE ONE STRIP TO CHECK GLUCOSE 4 TIMES DAILY   glucose blood test strip Commonly known as:  ACCU-CHEK AVIVA PLUS 1 each by Other route 6 (six) times daily. USE TO CHECK GLUCOSE 6 TIMES DAILY   hydrochlorothiazide 25 MG tablet Commonly known as:  HYDRODIURIL Take 1 tablet (25 mg total) by mouth daily.   insulin NPH Human 100 UNIT/ML injection Commonly known as:  HUMULIN N Inject 0.25 mLs (25 Units total) into the skin every morning AND 0.1 mLs (10 Units total) every evening.   insulin regular 100 units/mL  injection Commonly known as:  NOVOLIN R,HUMULIN R Inject 0.03 mLs (3 Units total) into the skin 3 (three) times daily before meals.   metFORMIN 500 MG tablet Commonly known as:  GLUCOPHAGE Take 2 tablets (1,000 mg total) by mouth 2 (two) times daily with a meal.   metoprolol tartrate 25 MG tablet Commonly known as:  LOPRESSOR Take 1 tablet (25 mg total) by mouth 2 (two) times daily.   omeprazole 40 MG capsule Commonly known as:  PRILOSEC TAKE 1 CAPSULE BY MOUTH TWICE DAILY   pravastatin 40 MG tablet Commonly known as:  PRAVACHOL Take 1 tablet (40 mg total) by mouth daily.   valACYclovir 1000 MG tablet Commonly known as:  VALTREX Take 1 tablet (1,000 mg total) by mouth as needed.  2g twice daily for 2 doses, start as soon as symptoms begin   VITAMIN D-3 PO Take 2,000 Units by mouth daily.          Objective:    BP (!) 136/56   Pulse 61   Temp 98.2 F (36.8 C) (Oral)   Ht 5\' 5"  (1.651 m)   Wt 134 lb (60.8 kg)   BMI 22.30 kg/m   Allergies  Allergen Reactions  . Sulfa Antibiotics   . Sulfa Drugs Cross Reactors Rash    Wt Readings from Last 3 Encounters:  08/26/18 134 lb (60.8 kg)  07/14/18 134 lb (60.8 kg)  06/12/18 131 lb 9.6 oz (59.7 kg)    Physical Exam Constitutional:      Appearance: She is well-developed.  HENT:     Head: Normocephalic and atraumatic.     Right Ear: Tympanic membrane and external ear normal. No middle ear effusion.     Left Ear: Tympanic membrane and external ear normal.  No middle ear effusion.     Nose: Mucosal edema and rhinorrhea present.     Right Sinus: Maxillary sinus tenderness and frontal sinus tenderness present.     Left Sinus: Maxillary sinus tenderness and frontal sinus tenderness present.     Mouth/Throat:     Pharynx: Uvula midline. Posterior oropharyngeal erythema present.  Eyes:     General:        Right eye: No discharge.        Left eye: No discharge.     Conjunctiva/sclera: Conjunctivae normal.     Pupils: Pupils are equal, round, and reactive to light.  Neck:     Musculoskeletal: Normal range of motion.  Cardiovascular:     Rate and Rhythm: Normal rate and regular rhythm.     Heart sounds: Normal heart sounds.  Pulmonary:     Effort: Pulmonary effort is normal. No respiratory distress.     Breath sounds: Normal breath sounds. No wheezing.  Abdominal:     Palpations: Abdomen is soft.  Lymphadenopathy:     Cervical: No cervical adenopathy.  Skin:    General: Skin is warm and dry.  Neurological:     Mental Status: She is alert and oriented to person, place, and time.         Assessment & Plan:   1. Acute non-recurrent frontal sinusitis - amoxicillin (AMOXIL) 500 MG capsule; Take 1  capsule (500 mg total) by mouth 3 (three) times daily.  Dispense: 42 capsule; Refill: 1   Continue all other maintenance medications as listed above.  Follow up plan: No follow-ups on file.  Educational handout given for [place] PA-C [place] [address]  [place], [place] [address] [phone]   08/27/2018, 2:48 PM

## 2018-09-02 DIAGNOSIS — D225 Melanocytic nevi of trunk: Secondary | ICD-10-CM | POA: Diagnosis not present

## 2018-09-02 DIAGNOSIS — Z08 Encounter for follow-up examination after completed treatment for malignant neoplasm: Secondary | ICD-10-CM | POA: Diagnosis not present

## 2018-09-02 DIAGNOSIS — L570 Actinic keratosis: Secondary | ICD-10-CM | POA: Diagnosis not present

## 2018-09-02 DIAGNOSIS — Z85828 Personal history of other malignant neoplasm of skin: Secondary | ICD-10-CM | POA: Diagnosis not present

## 2018-09-02 DIAGNOSIS — X32XXXD Exposure to sunlight, subsequent encounter: Secondary | ICD-10-CM | POA: Diagnosis not present

## 2018-09-11 ENCOUNTER — Ambulatory Visit (INDEPENDENT_AMBULATORY_CARE_PROVIDER_SITE_OTHER): Payer: Medicare Other | Admitting: Family Medicine

## 2018-09-11 ENCOUNTER — Encounter: Payer: Self-pay | Admitting: Family Medicine

## 2018-09-11 VITALS — BP 158/88 | HR 57 | Temp 97.8°F | Ht 65.0 in | Wt 132.0 lb

## 2018-09-11 DIAGNOSIS — Z794 Long term (current) use of insulin: Secondary | ICD-10-CM

## 2018-09-11 DIAGNOSIS — I10 Essential (primary) hypertension: Secondary | ICD-10-CM

## 2018-09-11 DIAGNOSIS — E785 Hyperlipidemia, unspecified: Secondary | ICD-10-CM | POA: Diagnosis not present

## 2018-09-11 DIAGNOSIS — E1169 Type 2 diabetes mellitus with other specified complication: Secondary | ICD-10-CM

## 2018-09-11 DIAGNOSIS — E1159 Type 2 diabetes mellitus with other circulatory complications: Secondary | ICD-10-CM

## 2018-09-11 DIAGNOSIS — E119 Type 2 diabetes mellitus without complications: Secondary | ICD-10-CM

## 2018-09-11 LAB — BAYER DCA HB A1C WAIVED: HB A1C: 7.9 % — AB (ref <7.0)

## 2018-09-11 MED ORDER — BENAZEPRIL HCL 40 MG PO TABS: 40.0000 mg | ORAL_TABLET | Freq: Every day | ORAL | 1 refills | Status: DC

## 2018-09-11 MED ORDER — AMLODIPINE BESYLATE 5 MG PO TABS: 5.0000 mg | ORAL_TABLET | Freq: Every day | ORAL | 1 refills | Status: DC

## 2018-09-11 MED ORDER — METOPROLOL TARTRATE 25 MG PO TABS: 25.0000 mg | ORAL_TABLET | Freq: Two times a day (BID) | ORAL | 1 refills | Status: DC

## 2018-09-11 MED ORDER — HYDROCHLOROTHIAZIDE 25 MG PO TABS: 25.0000 mg | ORAL_TABLET | Freq: Every day | ORAL | 1 refills | Status: DC

## 2018-09-11 MED ORDER — METFORMIN HCL 500 MG PO TABS: 1000.0000 mg | ORAL_TABLET | Freq: Two times a day (BID) | ORAL | 1 refills | Status: DC

## 2018-09-11 MED ORDER — PRAVASTATIN SODIUM 40 MG PO TABS: 40.0000 mg | ORAL_TABLET | Freq: Every day | ORAL | 1 refills | Status: DC

## 2018-09-11 NOTE — Patient Instructions (Signed)
DASH Eating Plan  DASH stands for "Dietary Approaches to Stop Hypertension." The DASH eating plan is a healthy eating plan that has been shown to reduce high blood pressure (hypertension). It may also reduce your risk for type 2 diabetes, heart disease, and stroke. The DASH eating plan may also help with weight loss.  What are tips for following this plan?    General guidelines   Avoid eating more than 2,300 mg (milligrams) of salt (sodium) a day. If you have hypertension, you may need to reduce your sodium intake to 1,500 mg a day.   Limit alcohol intake to no more than 1 drink a day for nonpregnant women and 2 drinks a day for men. One drink equals 12 oz of beer, 5 oz of wine, or 1 oz of hard liquor.   Work with your health care provider to maintain a healthy body weight or to lose weight. Ask what an ideal weight is for you.   Get at least 30 minutes of exercise that causes your heart to beat faster (aerobic exercise) most days of the week. Activities may include walking, swimming, or biking.   Work with your health care provider or diet and nutrition specialist (dietitian) to adjust your eating plan to your individual calorie needs.  Reading food labels     Check food labels for the amount of sodium per serving. Choose foods with less than 5 percent of the Daily Value of sodium. Generally, foods with less than 300 mg of sodium per serving fit into this eating plan.   To find whole grains, look for the word "whole" as the first word in the ingredient list.  Shopping   Buy products labeled as "low-sodium" or "no salt added."   Buy fresh foods. Avoid canned foods and premade or frozen meals.  Cooking   Avoid adding salt when cooking. Use salt-free seasonings or herbs instead of table salt or sea salt. Check with your health care provider or pharmacist before using salt substitutes.   Do not fry foods. Cook foods using healthy methods such as baking, boiling, grilling, and broiling instead.   Cook with  heart-healthy oils, such as olive, canola, soybean, or sunflower oil.  Meal planning   Eat a balanced diet that includes:  ? 5 or more servings of fruits and vegetables each day. At each meal, try to fill half of your plate with fruits and vegetables.  ? Up to 6-8 servings of whole grains each day.  ? Less than 6 oz of lean meat, poultry, or fish each day. A 3-oz serving of meat is about the same size as a deck of cards. One egg equals 1 oz.  ? 2 servings of low-fat dairy each day.  ? A serving of nuts, seeds, or beans 5 times each week.  ? Heart-healthy fats. Healthy fats called Omega-3 fatty acids are found in foods such as flaxseeds and coldwater fish, like sardines, salmon, and mackerel.   Limit how much you eat of the following:  ? Canned or prepackaged foods.  ? Food that is high in trans fat, such as fried foods.  ? Food that is high in saturated fat, such as fatty meat.  ? Sweets, desserts, sugary drinks, and other foods with added sugar.  ? Full-fat dairy products.   Do not salt foods before eating.   Try to eat at least 2 vegetarian meals each week.   Eat more home-cooked food and less restaurant, buffet, and fast food.     When eating at a restaurant, ask that your food be prepared with less salt or no salt, if possible.  What foods are recommended?  The items listed may not be a complete list. Talk with your dietitian about what dietary choices are best for you.  Grains  Whole-grain or whole-wheat bread. Whole-grain or whole-wheat pasta. Brown rice. Oatmeal. Quinoa. Bulgur. Whole-grain and low-sodium cereals. Pita bread. Low-fat, low-sodium crackers. Whole-wheat flour tortillas.  Vegetables  Fresh or frozen vegetables (raw, steamed, roasted, or grilled). Low-sodium or reduced-sodium tomato and vegetable juice. Low-sodium or reduced-sodium tomato sauce and tomato paste. Low-sodium or reduced-sodium canned vegetables.  Fruits  All fresh, dried, or frozen fruit. Canned fruit in natural juice (without  added sugar).  Meat and other protein foods  Skinless chicken or turkey. Ground chicken or turkey. Pork with fat trimmed off. Fish and seafood. Egg whites. Dried beans, peas, or lentils. Unsalted nuts, nut butters, and seeds. Unsalted canned beans. Lean cuts of beef with fat trimmed off. Low-sodium, lean deli meat.  Dairy  Low-fat (1%) or fat-free (skim) milk. Fat-free, low-fat, or reduced-fat cheeses. Nonfat, low-sodium ricotta or cottage cheese. Low-fat or nonfat yogurt. Low-fat, low-sodium cheese.  Fats and oils  Soft margarine without trans fats. Vegetable oil. Low-fat, reduced-fat, or light mayonnaise and salad dressings (reduced-sodium). Canola, safflower, olive, soybean, and sunflower oils. Avocado.  Seasoning and other foods  Herbs. Spices. Seasoning mixes without salt. Unsalted popcorn and pretzels. Fat-free sweets.  What foods are not recommended?  The items listed may not be a complete list. Talk with your dietitian about what dietary choices are best for you.  Grains  Baked goods made with fat, such as croissants, muffins, or some breads. Dry pasta or rice meal packs.  Vegetables  Creamed or fried vegetables. Vegetables in a cheese sauce. Regular canned vegetables (not low-sodium or reduced-sodium). Regular canned tomato sauce and paste (not low-sodium or reduced-sodium). Regular tomato and vegetable juice (not low-sodium or reduced-sodium). Pickles. Olives.  Fruits  Canned fruit in a light or heavy syrup. Fried fruit. Fruit in cream or butter sauce.  Meat and other protein foods  Fatty cuts of meat. Ribs. Fried meat. Bacon. Sausage. Bologna and other processed lunch meats. Salami. Fatback. Hotdogs. Bratwurst. Salted nuts and seeds. Canned beans with added salt. Canned or smoked fish. Whole eggs or egg yolks. Chicken or turkey with skin.  Dairy  Whole or 2% milk, cream, and half-and-half. Whole or full-fat cream cheese. Whole-fat or sweetened yogurt. Full-fat cheese. Nondairy creamers. Whipped toppings.  Processed cheese and cheese spreads.  Fats and oils  Butter. Stick margarine. Lard. Shortening. Ghee. Bacon fat. Tropical oils, such as coconut, palm kernel, or palm oil.  Seasoning and other foods  Salted popcorn and pretzels. Onion salt, garlic salt, seasoned salt, table salt, and sea salt. Worcestershire sauce. Tartar sauce. Barbecue sauce. Teriyaki sauce. Soy sauce, including reduced-sodium. Steak sauce. Canned and packaged gravies. Fish sauce. Oyster sauce. Cocktail sauce. Horseradish that you find on the shelf. Ketchup. Mustard. Meat flavorings and tenderizers. Bouillon cubes. Hot sauce and Tabasco sauce. Premade or packaged marinades. Premade or packaged taco seasonings. Relishes. Regular salad dressings.  Where to find more information:   National Heart, Lung, and Blood Institute: www.nhlbi.nih.gov   American Heart Association: www.heart.org  Summary   The DASH eating plan is a healthy eating plan that has been shown to reduce high blood pressure (hypertension). It may also reduce your risk for type 2 diabetes, heart disease, and stroke.   With the   DASH eating plan, you should limit salt (sodium) intake to 2,300 mg a day. If you have hypertension, you may need to reduce your sodium intake to 1,500 mg a day.   When on the DASH eating plan, aim to eat more fresh fruits and vegetables, whole grains, lean proteins, low-fat dairy, and heart-healthy fats.   Work with your health care provider or diet and nutrition specialist (dietitian) to adjust your eating plan to your individual calorie needs.  This information is not intended to replace advice given to you by your health care provider. Make sure you discuss any questions you have with your health care provider.  Document Released: 06/13/2011 Document Revised: 06/17/2016 Document Reviewed: 06/17/2016  [employer] Interactive Patient Education  2019 [employer]

## 2018-09-11 NOTE — Progress Notes (Signed)
Subjective:  Patient ID: Susan Petty, female    DOB: [birth year], 73 y.o.   MRN: [id number]  Chief Complaint:  Medical Management of Chronic Issues   HPI: Susan Petty is a 73 y.o. female presenting on 09/11/2018 for Medical Management of Chronic Issues   1. Type 2 diabetes mellitus with insulin therapy ([place])  Compliant with meds - Yes Checking CBGs? Yes  Fasting avg - 98  Postprandial average - 300 Exercising regularly? - No Watching carbohydrate intake? - Yes Neuropathy ? - No Hypoglycemic events - No Pertinent ROS:  Polyuria - No Polydipsia - No Vision problems - No   2. Hypertension associated with type 2 diabetes mellitus ([place])  Complaint with meds - Yes Checking BP at home ranging 130/70 Exercising Regularly - No Watching Salt intake - Yes Pertinent ROS:  Headache - No Chest pain - No Dyspnea - No Palpitations - No LE edema - No They report good compliance with medications and can restate their regimen by memory. No medication side effects.  BP Readings from Last 3 Encounters:  09/11/18 (!) 158/88  08/26/18 (!) 136/56  07/14/18 (!) 160/68     3. Hyperlipidemia associated with type 2 diabetes mellitus ([place])  Does watch diet. Does not exercise on a regular basis. Taking statin and tolerating well.      Relevant past medical, surgical, family, and social history reviewed and updated as indicated.  Allergies and medications reviewed and updated.   Past Medical History:  Diagnosis Date  . Diabetes mellitus   . GERD (gastroesophageal reflux disease)   . Hypertension   . Osteoporosis     Past Surgical History:  Procedure Laterality Date  . COLONOSCOPY N/A 03/29/2016   Procedure: COLONOSCOPY;  Surgeon: Danie Binder, MD;  [facility];  Service: Endoscopy;  Laterality: N/A;  10:00 am  . ESOPHAGOGASTRODUODENOSCOPY N/A 03/29/2016   Procedure: ESOPHAGOGASTRODUODENOSCOPY (EGD);  Surgeon: Danie Binder, MD;  Location: [facility];   Service: Endoscopy;  Laterality: N/A;  . TOTAL HIP ARTHROPLASTY  09/03/2011   Procedure: TOTAL HIP ARTHROPLASTY;  Surgeon: Mauri Pole, MD;  Location: [facility];  Service: Orthopedics;  Laterality: Left;    Social History   Socioeconomic History  . Marital status: Widowed    Spouse name: Not on file  . Number of children: Not on file  . Years of education: Not on file  . Highest education level: Not on file  Occupational History  . Not on file  Social Needs  . Financial resource strain: Not on file  . Food insecurity:    Worry: Not on file    Inability: Not on file  . Transportation needs:    Medical: Not on file    Non-medical: Not on file  Tobacco Use  . Smoking status: Never Smoker  . Smokeless tobacco: Never Used  Substance and Sexual Activity  . Alcohol use: Yes    Comment: occasional wine  . Drug use: No  . Sexual activity: Not on file  Lifestyle  . Physical activity:    Days per week: Not on file    Minutes per session: Not on file  . Stress: Not on file  Relationships  . Social connections:    Talks on phone: Not on file    Gets together: Not on file    Attends religious service: Not on file    Active member of club or organization: Not on file    Attends meetings  of clubs or organizations: Not on file    Relationship status: Not on file  . Intimate partner violence:    Fear of current or ex partner: Not on file    Emotionally abused: Not on file    Physically abused: Not on file    Forced sexual activity: Not on file  Other Topics Concern  . Not on file  Social History Narrative  . Not on file    Outpatient Encounter Medications as of 09/11/2018  Medication Sig  . alendronate (FOSAMAX) 70 MG tablet TAKE ONE TABLET BY MOUTH ONCE A WEEK WITH A FULL GLASS OF WATER ON AN EMPTY STOMACH  . AMITIZA 8 MCG capsule TAKE 1 CAPSULE BY MOUTH TWICE DAILY WITH A MEAL  . amLODipine (NORVASC) 5 MG tablet Take 1 tablet (5 mg total) by mouth daily.  Marland Kitchen aspirin EC 81 MG  tablet Take 81 mg by mouth daily.  . benazepril (LOTENSIN) 40 MG tablet Take 1 tablet (40 mg total) by mouth daily.  . Blood Glucose Monitoring Suppl (ACCU-CHEK AVIVA) device Use as instructed  . Cholecalciferol (VITAMIN D-3 PO) Take 2,000 Units by mouth daily.  . fluticasone (FLONASE) 50 MCG/ACT nasal spray Place 2 sprays into both nostrils daily.  Marland Kitchen glucose blood (ACCU-CHEK AVIVA PLUS) test strip USE ONE STRIP TO CHECK GLUCOSE 4 TIMES DAILY  . glucose blood (ACCU-CHEK AVIVA PLUS) test strip 1 each by Other route 6 (six) times daily. USE TO CHECK GLUCOSE 6 TIMES DAILY  . hydrochlorothiazide (HYDRODIURIL) 25 MG tablet Take 1 tablet (25 mg total) by mouth daily.  . insulin NPH Human (HUMULIN N) 100 UNIT/ML injection Inject 0.25 mLs (25 Units total) into the skin every morning AND 0.1 mLs (10 Units total) every evening.  . insulin regular (NOVOLIN R,HUMULIN R) 100 units/mL injection Inject 0.03 mLs (3 Units total) into the skin 3 (three) times daily before meals.  . metFORMIN (GLUCOPHAGE) 500 MG tablet Take 2 tablets (1,000 mg total) by mouth 2 (two) times daily with a meal.  . metoprolol tartrate (LOPRESSOR) 25 MG tablet Take 1 tablet (25 mg total) by mouth 2 (two) times daily.  Marland Kitchen omeprazole (PRILOSEC) 40 MG capsule TAKE 1 CAPSULE BY MOUTH TWICE DAILY  . pravastatin (PRAVACHOL) 40 MG tablet Take 1 tablet (40 mg total) by mouth daily.  . [DISCONTINUED] amLODipine (NORVASC) 5 MG tablet Take 1 tablet (5 mg total) by mouth daily.  . [DISCONTINUED] benazepril (LOTENSIN) 40 MG tablet Take 1 tablet (40 mg total) by mouth daily.  . [DISCONTINUED] hydrochlorothiazide (HYDRODIURIL) 25 MG tablet Take 1 tablet (25 mg total) by mouth daily.  . [DISCONTINUED] metFORMIN (GLUCOPHAGE) 500 MG tablet Take 2 tablets (1,000 mg total) by mouth 2 (two) times daily with a meal.  . [DISCONTINUED] metoprolol tartrate (LOPRESSOR) 25 MG tablet Take 1 tablet (25 mg total) by mouth 2 (two) times daily.  . [DISCONTINUED]  pravastatin (PRAVACHOL) 40 MG tablet Take 1 tablet (40 mg total) by mouth daily.  . [DISCONTINUED] valACYclovir (VALTREX) 1000 MG tablet Take 1 tablet (1,000 mg total) by mouth as needed. 2g twice daily for 2 doses, start as soon as symptoms begin  . [DISCONTINUED] amoxicillin (AMOXIL) 500 MG capsule Take 1 capsule (500 mg total) by mouth 3 (three) times daily.   No facility-administered encounter medications on file as of 09/11/2018.     Allergies  Allergen Reactions  . Sulfa Antibiotics   . Sulfa Drugs Cross Reactors Rash    Review of Systems  Constitutional:  Negative for chills, fatigue, fever and unexpected weight change.  HENT: Positive for rhinorrhea. Negative for congestion, postnasal drip, sinus pressure and sinus pain.   Eyes: Negative for photophobia and visual disturbance.  Respiratory: Negative for cough, chest tightness and shortness of breath.   Cardiovascular: Negative for chest pain, palpitations and leg swelling.  Gastrointestinal: Negative for abdominal pain, constipation, diarrhea, nausea and vomiting.  Endocrine: Negative for cold intolerance, heat intolerance, polydipsia, polyphagia and polyuria.  Genitourinary: Negative for decreased urine volume and difficulty urinating.  Neurological: Negative for dizziness, tremors, seizures, syncope, facial asymmetry, speech difficulty, weakness, light-headedness, numbness and headaches.  Psychiatric/Behavioral: Negative for confusion.  All other systems reviewed and are negative.       Objective:  BP (!) 158/88   Pulse (!) 57   Temp 97.8 F (36.6 C) (Oral)   Ht '5\' 5"'$  (1.651 m)   Wt 132 lb (59.9 kg)   BMI 21.97 kg/m    Wt Readings from Last 3 Encounters:  09/11/18 132 lb (59.9 kg)  08/26/18 134 lb (60.8 kg)  07/14/18 134 lb (60.8 kg)    Physical Exam Vitals signs and nursing note reviewed.  Constitutional:      General: She is not in acute distress.    Appearance: Normal appearance. She is not ill-appearing or  toxic-appearing.  HENT:     Head: Normocephalic and atraumatic.     Right Ear: Tympanic membrane, ear canal and external ear normal.     Left Ear: Tympanic membrane, ear canal and external ear normal.     Nose: Nose normal.     Mouth/Throat:     Mouth: Mucous membranes are moist.     Pharynx: Oropharynx is clear.  Eyes:     Conjunctiva/sclera: Conjunctivae normal.     Pupils: Pupils are equal, round, and reactive to light.  Neck:     Musculoskeletal: Normal range of motion and neck supple.     Thyroid: No thyroid mass, thyromegaly or thyroid tenderness.     Vascular: No carotid bruit or JVD.  Cardiovascular:     Rate and Rhythm: Normal rate and regular rhythm.     Pulses: Normal pulses.     Heart sounds: Normal heart sounds. No murmur. No friction rub. No gallop.   Pulmonary:     Effort: Pulmonary effort is normal. No respiratory distress.     Breath sounds: Normal breath sounds.  Skin:    General: Skin is warm and dry.     Capillary Refill: Capillary refill takes less than 2 seconds.  Neurological:     General: No focal deficit present.     Mental Status: She is alert and oriented to person, place, and time.     Cranial Nerves: No cranial nerve deficit.     Sensory: No sensory deficit.     Motor: No weakness.     Coordination: Coordination normal.     Gait: Gait normal.     Deep Tendon Reflexes: Reflexes normal.  Psychiatric:        Mood and Affect: Mood normal.        Behavior: Behavior normal. Behavior is cooperative.        Thought Content: Thought content normal.        Judgment: Judgment normal.     Results for orders placed or performed in visit on 06/12/18  [facility]  Result Value Ref Range   Glucose 401 (HH) 65 - 99 mg/dL   BUN 12 8 - 27 mg/dL  Creatinine, Ser 0.81 0.57 - 1.00 mg/dL   GFR calc non Af Amer 72 >59 mL/min/1.73   GFR calc Af Amer 83 >59 mL/min/1.73   BUN/Creatinine Ratio 15 12 - 28   Sodium 135 134 - 144 mmol/L   Potassium 4.0 3.5 - 5.2  mmol/L   Chloride 92 (L) 96 - 106 mmol/L   CO2 25 20 - 29 mmol/L   Calcium 9.0 8.7 - 10.3 mg/dL  Lipid panel  Result Value Ref Range   Cholesterol, Total 148 100 - 199 mg/dL   Triglycerides 70 0 - 149 mg/dL   HDL 79 >39 mg/dL   VLDL Cholesterol Cal 14 5 - 40 mg/dL   LDL Calculated 55 0 - 99 mg/dL   Chol/HDL Ratio 1.9 0.0 - 4.4 ratio  Microalbumin / creatinine urine ratio  Result Value Ref Range   Creatinine, Urine 13.2 Not Estab. mg/dL   Microalbumin, Urine <3.0 Not Estab. ug/mL   Microalb/Creat Ratio <22.7 0.0 - 30.0 mg/g creat  Bayer DCA Hb A1c Waived  Result Value Ref Range   HB A1C (BAYER DCA - WAIVED) 7.4 (H) <7.0 %     A1C 7.9 today  Pertinent labs & imaging results that were available during my care of the patient were reviewed by me and considered in my medical decision making.  Assessment & Plan:  Ruby was seen today for medical management of chronic issues.  Diagnoses and all orders for this visit:  Type 2 diabetes mellitus with insulin therapy (H[facility]) A1C 7.9 today. Diet and exercise discussed. Appropriate snacks discussed. Report any persistent highs. Reevaluate in 3 months. Continue medications as prescribed.  -     CBC with Differential/Platelet -     CMP14+EGFR -     Bayer DCA Hb A1c Waived -     metFORMIN (GLUCOPHAGE) 500 MG tablet; Take 2 tablets (1,000 mg total) by mouth 2 (two) times daily with a meal.  Hypertension associated with type 2 diabetes mellitus (HCC) Elevated blood pressure reading in office today. Pt states she has been running 130/80 at home. Pt to report any persistent highs. Diet and exercise encouraged. Labs pending. Continue medications as prescribed.  -     CBC with Differential/Platelet -     CMP14+EGFR -     hydrochlorothiazide (HYDRODIURIL) 25 MG tablet; Take 1 tablet (25 mg total) by mouth daily. -     benazepril (LOTENSIN) 40 MG tablet; Take 1 tablet (40 mg total) by mouth daily. -     amLODipine (NORVASC) 5 MG tablet; Take 1  tablet (5 mg total) by mouth daily. -     metoprolol tartrate (LOPRESSOR) 25 MG tablet; Take 1 tablet (25 mg total) by mouth 2 (two) times daily.  Hyperlipidemia associated with type 2 diabetes mellitus ([place]) Diet and exercise encouraged. Continue medications as prescribed. Labs pending.  -     CBC with Differential/Platelet -     CMP14+EGFR -     Lipid panel -     pravastatin (PRAVACHOL) 40 MG tablet; Take 1 tablet (40 mg total) by mouth daily.     Continue all other maintenance medications.  Follow up plan: Return in about 3 months (around 12/12/2018), or if symptoms worsen or fail to improve, for DM, HTN.  Educational handout given for DASH eating plan  The above assessment and management plan was discussed with the patient. The patient verbalized understanding of and has agreed to the management plan. Patient is aware to call the clinic if  symptoms persist or worsen. Patient is aware when to return to the clinic for a follow-up visit. Patient educated on when it is appropriate to go to the emergency department.   Monia Pouch, FNP-C [facility] [phone]

## 2018-09-12 LAB — CMP14+EGFR
ALBUMIN: 4.1 g/dL (ref 3.7–4.7)
ALK PHOS: 65 IU/L (ref 39–117)
ALT: 12 IU/L (ref 0–32)
AST: 15 IU/L (ref 0–40)
Albumin/Globulin Ratio: 1.8 (ref 1.2–2.2)
BUN / CREAT RATIO: 18 (ref 12–28)
BUN: 12 mg/dL (ref 8–27)
Bilirubin Total: 0.4 mg/dL (ref 0.0–1.2)
CO2: 27 mmol/L (ref 20–29)
CREATININE: 0.68 mg/dL (ref 0.57–1.00)
Calcium: 9.4 mg/dL (ref 8.7–10.3)
Chloride: 100 mmol/L (ref 96–106)
GFR calc Af Amer: 100 mL/min/{1.73_m2} (ref >59)
GFR calc non Af Amer: 86 mL/min/{1.73_m2} (ref >59)
Globulin, Total: 2.3 g/dL (ref 1.5–4.5)
Glucose: 277 mg/dL — ABNORMAL HIGH (ref 65–99)
Potassium: 4.6 mmol/L (ref 3.5–5.2)
SODIUM: 143 mmol/L (ref 134–144)
Total Protein: 6.4 g/dL (ref 6.0–8.5)

## 2018-09-12 LAB — CBC WITH DIFFERENTIAL/PLATELET
Basophils Absolute: 0.2 10*3/uL (ref 0.0–0.2)
Basos: 4 %
EOS (ABSOLUTE): 0.5 10*3/uL — ABNORMAL HIGH (ref 0.0–0.4)
Eos: 9 %
HEMOGLOBIN: 11.5 g/dL (ref 11.1–15.9)
Hematocrit: 34.4 % (ref 34.0–46.6)
Immature Grans (Abs): 0 10*3/uL (ref 0.0–0.1)
Immature Granulocytes: 0 %
Lymphocytes Absolute: 2 10*3/uL (ref 0.7–3.1)
Lymphs: 40 %
MCH: 31.3 pg (ref 26.6–33.0)
MCHC: 33.4 g/dL (ref 31.5–35.7)
MCV: 94 fL (ref 79–97)
Monocytes Absolute: 0.4 10*3/uL (ref 0.1–0.9)
Monocytes: 7 %
Neutrophils Absolute: 2 10*3/uL (ref 1.4–7.0)
Neutrophils: 40 %
Platelets: 190 10*3/uL (ref 150–450)
RBC: 3.68 x10E6/uL — ABNORMAL LOW (ref 3.77–5.28)
RDW: 12.2 % (ref 11.7–15.4)
WBC: 5 10*3/uL (ref 3.4–10.8)

## 2018-09-12 LAB — LIPID PANEL
Chol/HDL Ratio: 2 ratio (ref 0.0–4.4)
Cholesterol, Total: 151 mg/dL (ref 100–199)
HDL: 77 mg/dL (ref >39)
LDL Calculated: 62 mg/dL (ref 0–99)
Triglycerides: 59 mg/dL (ref 0–149)
VLDL Cholesterol Cal: 12 mg/dL (ref 5–40)

## 2018-10-14 ENCOUNTER — Other Ambulatory Visit: Payer: Self-pay | Admitting: Pediatrics

## 2018-10-14 ENCOUNTER — Other Ambulatory Visit: Payer: Self-pay | Admitting: Physician Assistant

## 2018-10-14 DIAGNOSIS — J0110 Acute frontal sinusitis, unspecified: Secondary | ICD-10-CM

## 2018-10-14 DIAGNOSIS — Z794 Long term (current) use of insulin: Principal | ICD-10-CM

## 2018-10-14 DIAGNOSIS — E119 Type 2 diabetes mellitus without complications: Secondary | ICD-10-CM

## 2018-10-15 ENCOUNTER — Encounter: Payer: Self-pay | Admitting: Family Medicine

## 2018-10-15 ENCOUNTER — Ambulatory Visit (INDEPENDENT_AMBULATORY_CARE_PROVIDER_SITE_OTHER): Payer: Medicare Other | Admitting: Family Medicine

## 2018-10-15 ENCOUNTER — Other Ambulatory Visit: Payer: Self-pay

## 2018-10-15 DIAGNOSIS — J0110 Acute frontal sinusitis, unspecified: Secondary | ICD-10-CM | POA: Diagnosis not present

## 2018-10-15 DIAGNOSIS — Z794 Long term (current) use of insulin: Secondary | ICD-10-CM | POA: Diagnosis not present

## 2018-10-15 DIAGNOSIS — E119 Type 2 diabetes mellitus without complications: Secondary | ICD-10-CM | POA: Diagnosis not present

## 2018-10-15 MED ORDER — INSULIN NPH (HUMAN) (ISOPHANE) 100 UNIT/ML ~~LOC~~ SUSP: SUBCUTANEOUS | 0 refills | Status: DC

## 2018-10-15 MED ORDER — AMOXICILLIN-POT CLAVULANATE 875-125 MG PO TABS: 1.0000 | ORAL_TABLET | Freq: Two times a day (BID) | ORAL | 0 refills | Status: AC

## 2018-10-15 NOTE — Progress Notes (Addendum)
Virtual Visit via telephone Note Due to COVID-19, visit is conducted virtually and was requested by patient.  I connected with Susan Petty on 10/15/18 at 0800 by telephone and verified that I am speaking with the correct person using two identifiers. Susan Petty is currently located at home and family is currently with them during visit. The provider, Monia Pouch, FNP is located in their office at time of visit.  I discussed the limitations, risks, security and privacy concerns of performing an evaluation and management service by telephone and the availability of in person appointments. I also discussed with the patient that there may be a patient responsible charge related to this service. The patient expressed understanding and agreed to proceed.  Subjective:  Patient ID: Susan Petty, female    DOB: 1945/12/10, 73 y.o.   MRN: [id number]  Chief Complaint:  Sinus Problem   HPI: Susan Petty is a 73 y.o. female presenting on 10/15/2018 for Sinus Problem   Pt reports 5 days of frontal sinus pressure, headache, ear fullness, postnasal drainage, rhinorrhea, and sore throat. She states she has not checked her temperature but has had the chills. Pt states she has been using her Flonase without relief of symptoms. Pt denies cough, shortness of breath, or chest pain. No recent travel or known sick exposures.    Relevant past medical, surgical, family, and social history reviewed and updated as indicated.  Allergies and medications reviewed and updated.   Past Medical History:  Diagnosis Date  . Diabetes mellitus   . GERD (gastroesophageal reflux disease)   . Hypertension   . Osteoporosis     Past Surgical History:  Procedure Laterality Date  . COLONOSCOPY N/A 03/29/2016   Procedure: COLONOSCOPY;  Surgeon: Danie Binder, MD;  [facility];  Service: Endoscopy;  Laterality: N/A;  10:00 am  . ESOPHAGOGASTRODUODENOSCOPY N/A 03/29/2016   Procedure:  ESOPHAGOGASTRODUODENOSCOPY (EGD);  Surgeon: Danie Binder, MD;  [facility];  Service: Endoscopy;  Laterality: N/A;  . TOTAL HIP ARTHROPLASTY  09/03/2011   Procedure: TOTAL HIP ARTHROPLASTY;  Surgeon: Mauri Pole, MD;  Location: [facility];  Service: Orthopedics;  Laterality: Left;    Social History   Socioeconomic History  . Marital status: Widowed    Spouse name: Not on file  . Number of children: Not on file  . Years of education: Not on file  . Highest education level: Not on file  Occupational History  . Not on file  Social Needs  . Financial resource strain: Not on file  . Food insecurity:    Worry: Not on file    Inability: Not on file  . Transportation needs:    Medical: Not on file    Non-medical: Not on file  Tobacco Use  . Smoking status: Never Smoker  . Smokeless tobacco: Never Used  Substance and Sexual Activity  . Alcohol use: Yes    Comment: occasional wine  . Drug use: No  . Sexual activity: Not on file  Lifestyle  . Physical activity:    Days per week: Not on file    Minutes per session: Not on file  . Stress: Not on file  Relationships  . Social connections:    Talks on phone: Not on file    Gets together: Not on file    Attends religious service: Not on file    Active member of club or organization: Not on file    Attends meetings of  clubs or organizations: Not on file    Relationship status: Not on file  . Intimate partner violence:    Fear of current or ex partner: Not on file    Emotionally abused: Not on file    Physically abused: Not on file    Forced sexual activity: Not on file  Other Topics Concern  . Not on file  Social History Narrative  . Not on file    Outpatient Encounter Medications as of 10/15/2018  Medication Sig  . alendronate (FOSAMAX) 70 MG tablet TAKE ONE TABLET BY MOUTH ONCE A WEEK WITH A FULL GLASS OF WATER ON AN EMPTY STOMACH  . AMITIZA 8 MCG capsule TAKE 1 CAPSULE BY MOUTH TWICE DAILY WITH A MEAL  .  amLODipine (NORVASC) 5 MG tablet Take 1 tablet (5 mg total) by mouth daily.  Marland Kitchen amoxicillin-clavulanate (AUGMENTIN) 875-125 MG tablet Take 1 tablet by mouth 2 (two) times daily for 7 days.  Marland Kitchen aspirin EC 81 MG tablet Take 81 mg by mouth daily.  . benazepril (LOTENSIN) 40 MG tablet Take 1 tablet (40 mg total) by mouth daily.  . Blood Glucose Monitoring Suppl (ACCU-CHEK AVIVA) device Use as instructed  . Cholecalciferol (VITAMIN D-3 PO) Take 2,000 Units by mouth daily.  . fluticasone (FLONASE) 50 MCG/ACT nasal spray Place 2 sprays into both nostrils daily.  Marland Kitchen glucose blood (ACCU-CHEK AVIVA PLUS) test strip USE ONE STRIP TO CHECK GLUCOSE 4 TIMES DAILY  . glucose blood (ACCU-CHEK AVIVA PLUS) test strip 1 each by Other route 6 (six) times daily. USE TO CHECK GLUCOSE 6 TIMES DAILY  . hydrochlorothiazide (HYDRODIURIL) 25 MG tablet Take 1 tablet (25 mg total) by mouth daily.  . insulin NPH Human (HUMULIN N) 100 UNIT/ML injection INJECT 25 UNITS SUBCUTANEOUSLY IN THE MORNING AND 10 UNITS IN THE EVENING  . insulin regular (NOVOLIN R,HUMULIN R) 100 units/mL injection Inject 0.03 mLs (3 Units total) into the skin 3 (three) times daily before meals.  . metFORMIN (GLUCOPHAGE) 500 MG tablet Take 2 tablets (1,000 mg total) by mouth 2 (two) times daily with a meal.  . metoprolol tartrate (LOPRESSOR) 25 MG tablet Take 1 tablet (25 mg total) by mouth 2 (two) times daily.  Marland Kitchen omeprazole (PRILOSEC) 40 MG capsule TAKE 1 CAPSULE BY MOUTH TWICE DAILY  . pravastatin (PRAVACHOL) 40 MG tablet Take 1 tablet (40 mg total) by mouth daily.  . [DISCONTINUED] HUMULIN N 100 UNIT/ML injection INJECT 25 UNITS SUBCUTANEOUSLY IN THE MORNING AND 10 UNITS IN THE EVENING   No facility-administered encounter medications on file as of 10/15/2018.     Allergies  Allergen Reactions  . Sulfa Antibiotics   . Sulfa Drugs Cross Reactors Rash    Review of Systems  Constitutional: Positive for chills and fatigue. Negative for fever.  HENT:  Positive for congestion, postnasal drip, rhinorrhea, sinus pressure, sinus pain and sore throat.   Respiratory: Negative for cough and shortness of breath.   Cardiovascular: Negative for chest pain, palpitations and leg swelling.  Neurological: Positive for headaches. Negative for dizziness, tremors, seizures, syncope, facial asymmetry, speech difficulty, weakness, light-headedness and numbness.  Psychiatric/Behavioral: Negative for confusion.  All other systems reviewed and are negative.        Observations/Objective: No vital signs or physical exam, this was a telephone or virtual health encounter.  Pt alert and oriented, answers all questions appropriately, and able to speak in full sentences.    Assessment and Plan: Diagnoses and all orders for this visit:  Acute  non-recurrent frontal sinusitis Continue Flonase. Frequent saline nasal sprays. Symptomatic care discussed. Medications as prescribed. Report any new or worsening symptoms.  -     amoxicillin-clavulanate (AUGMENTIN) 875-125 MG tablet; Take 1 tablet by mouth 2 (two) times daily for 7 days.  Type 2 diabetes mellitus with insulin therapy ([place]) Pt states the pharmacy messed up her medications and did not have her insulin, refill sent.  -     insulin NPH Human (HUMULIN N) 100 UNIT/ML injection; INJECT 25 UNITS SUBCUTANEOUSLY IN THE MORNING AND 10 UNITS IN THE EVENING     Follow Up Instructions: Return in about 2 months (around 12/15/2018), or if symptoms worsen or fail to improve, for DM.    I discussed the assessment and treatment plan with the patient. The patient was provided an opportunity to ask questions and all were answered. The patient agreed with the plan and demonstrated an understanding of the instructions.   The patient was advised to call back or seek an in-person evaluation if the symptoms worsen or if the condition fails to improve as anticipated.  The above assessment and management plan was discussed with  the patient. The patient verbalized understanding of and has agreed to the management plan. Patient is aware to call the clinic if symptoms persist or worsen. Patient is aware when to return to the clinic for a follow-up visit. Patient educated on when it is appropriate to go to the emergency department.    I provided 15 minutes of non-face-to-face time during this encounter. The call started at 0800. The call ended at 0815.   Monia Pouch, FNP-C [facility] [address] [place], [place] [address] [phone]

## 2018-11-17 ENCOUNTER — Encounter: Payer: Self-pay | Admitting: Family Medicine

## 2018-11-17 ENCOUNTER — Other Ambulatory Visit: Payer: Self-pay

## 2018-11-17 DIAGNOSIS — E1159 Type 2 diabetes mellitus with other circulatory complications: Secondary | ICD-10-CM

## 2018-11-17 MED ORDER — BENAZEPRIL HCL 40 MG PO TABS: 40.0000 mg | ORAL_TABLET | Freq: Every day | ORAL | 0 refills | Status: DC

## 2018-11-17 MED ORDER — AMLODIPINE BESYLATE 5 MG PO TABS: 5.0000 mg | ORAL_TABLET | Freq: Every day | ORAL | 0 refills | Status: DC

## 2018-12-11 ENCOUNTER — Other Ambulatory Visit: Payer: Self-pay

## 2018-12-11 ENCOUNTER — Ambulatory Visit (INDEPENDENT_AMBULATORY_CARE_PROVIDER_SITE_OTHER): Payer: Medicare Other | Admitting: Family Medicine

## 2018-12-11 ENCOUNTER — Encounter: Payer: Self-pay | Admitting: Family Medicine

## 2018-12-11 VITALS — BP 135/63 | HR 67 | Temp 98.4°F | Ht 65.0 in | Wt 136.0 lb

## 2018-12-11 DIAGNOSIS — Z794 Long term (current) use of insulin: Secondary | ICD-10-CM | POA: Diagnosis not present

## 2018-12-11 DIAGNOSIS — K219 Gastro-esophageal reflux disease without esophagitis: Secondary | ICD-10-CM | POA: Diagnosis not present

## 2018-12-11 DIAGNOSIS — E119 Type 2 diabetes mellitus without complications: Secondary | ICD-10-CM | POA: Diagnosis not present

## 2018-12-11 DIAGNOSIS — E785 Hyperlipidemia, unspecified: Secondary | ICD-10-CM

## 2018-12-11 DIAGNOSIS — R5383 Other fatigue: Secondary | ICD-10-CM

## 2018-12-11 DIAGNOSIS — E1159 Type 2 diabetes mellitus with other circulatory complications: Secondary | ICD-10-CM | POA: Diagnosis not present

## 2018-12-11 DIAGNOSIS — B9689 Other specified bacterial agents as the cause of diseases classified elsewhere: Secondary | ICD-10-CM

## 2018-12-11 DIAGNOSIS — R6889 Other general symptoms and signs: Secondary | ICD-10-CM | POA: Diagnosis not present

## 2018-12-11 DIAGNOSIS — K581 Irritable bowel syndrome with constipation: Secondary | ICD-10-CM

## 2018-12-11 DIAGNOSIS — M810 Age-related osteoporosis without current pathological fracture: Secondary | ICD-10-CM | POA: Insufficient documentation

## 2018-12-11 DIAGNOSIS — E1169 Type 2 diabetes mellitus with other specified complication: Secondary | ICD-10-CM | POA: Diagnosis not present

## 2018-12-11 DIAGNOSIS — I10 Essential (primary) hypertension: Secondary | ICD-10-CM

## 2018-12-11 DIAGNOSIS — J208 Acute bronchitis due to other specified organisms: Secondary | ICD-10-CM

## 2018-12-11 LAB — BAYER DCA HB A1C WAIVED: HB A1C (BAYER DCA - WAIVED): 7.8 % — ABNORMAL HIGH (ref <7.0)

## 2018-12-11 LAB — LIPID PANEL

## 2018-12-11 MED ORDER — INSULIN REGULAR HUMAN 100 UNIT/ML IJ SOLN: 3.0000 [IU] | Freq: Three times a day (TID) | INTRAMUSCULAR | 1 refills | Status: DC

## 2018-12-11 MED ORDER — AMLODIPINE BESYLATE 5 MG PO TABS: 5.0000 mg | ORAL_TABLET | Freq: Every day | ORAL | 1 refills | Status: DC

## 2018-12-11 MED ORDER — PRAVASTATIN SODIUM 40 MG PO TABS: 40.0000 mg | ORAL_TABLET | Freq: Every day | ORAL | 1 refills | Status: DC

## 2018-12-11 MED ORDER — METFORMIN HCL 500 MG PO TABS: 1000.0000 mg | ORAL_TABLET | Freq: Two times a day (BID) | ORAL | 1 refills | Status: DC

## 2018-12-11 MED ORDER — HYDROCHLOROTHIAZIDE 25 MG PO TABS: 25.0000 mg | ORAL_TABLET | Freq: Every day | ORAL | 1 refills | Status: DC

## 2018-12-11 MED ORDER — METOPROLOL TARTRATE 25 MG PO TABS: 25.0000 mg | ORAL_TABLET | Freq: Two times a day (BID) | ORAL | 1 refills | Status: DC

## 2018-12-11 MED ORDER — BENAZEPRIL HCL 40 MG PO TABS: 40.0000 mg | ORAL_TABLET | Freq: Every day | ORAL | 1 refills | Status: DC

## 2018-12-11 MED ORDER — INSULIN NPH (HUMAN) (ISOPHANE) 100 UNIT/ML ~~LOC~~ SUSP: SUBCUTANEOUS | 3 refills | Status: DC

## 2018-12-11 MED ORDER — LUBIPROSTONE 8 MCG PO CAPS: ORAL_CAPSULE | ORAL | 3 refills | Status: DC

## 2018-12-11 MED ORDER — AMOXICILLIN-POT CLAVULANATE 875-125 MG PO TABS: 1.0000 | ORAL_TABLET | Freq: Two times a day (BID) | ORAL | 0 refills | Status: AC

## 2018-12-11 MED ORDER — OMEPRAZOLE 40 MG PO CPDR: 40.0000 mg | DELAYED_RELEASE_CAPSULE | Freq: Two times a day (BID) | ORAL | 3 refills | Status: DC

## 2018-12-11 MED ORDER — ALENDRONATE SODIUM 70 MG PO TABS: ORAL_TABLET | ORAL | 2 refills | Status: DC

## 2018-12-11 NOTE — Progress Notes (Signed)
Subjective:  Patient ID: Susan Petty, female    DOB: 11-14-1945, 73 y.o.   MRN: [id number]  Chief Complaint:  Medical Management of Chronic Issues   HPI: Susan Petty is a 73 y.o. female presenting on 12/11/2018 for Medical Management of Chronic Issues   1. Type 2 diabetes mellitus with insulin therapy ([place])  Diabetes mellitus 2 Compliant with meds - Yes Checking CBGs? Yes   Fasting avg -110  Exercising regularly? - Yes Watching carbohydrate intake? - Yes Neuropathy ? - No Hypoglycemic events - No  Pertinent ROS:  Polyuria - No Polydipsia - No Vision problems - No  Pt due for diabetic eye exam. States she will schedule an appointment when the COVID-19 pandemic is over.    2. Age-related osteoporosis without current pathological fracture  Has been out of Fosamax and is not taking. States she ran out and did not think to ask for refills. States she is willing to continue medications. She tolerated the medication well when she was on it before. No jaw pain, loose teeth, or trouble chewing. She does try to take a calcium supplement daily.    3. Hyperlipidemia associated with type 2 diabetes mellitus ([place])  Compliant with medications. No myalgias or arthralgias. Does try to watch diet and exercise on a regular basis.    4. Hypertension associated with type 2 diabetes mellitus ([place])  Complaint with meds - Yes Checking BP at home ranging 130/80 Exercising Regularly - Yes Watching Salt intake - Yes Pertinent ROS:  Headache - No Chest pain - No Dyspnea - Mild exertional shortness of breath over the last few months Palpitations - No LE edema - No They report good compliance with medications and can restate their regimen by memory. No medication side effects.  BP Readings from Last 3 Encounters:  12/11/18 135/63  09/11/18 (!) 158/88  08/26/18 (!) 136/56    5. Gastroesophageal reflux disease without esophagitis  Doing well with current medications. No sore throat,  hemoptysis, dysphagia, or voice change. No changes in stool color. No melena or hematochezia.    6. Irritable bowel syndrome with constipation  Ongoing IBS. Well controlled with current medications. States she has a BM at least every other day. No abdominal cramping or diarrhea with the medications.    7. Acute bacterial bronchitis  Pt reports ongoing cough, congestion, wheezing, and sputum production with exertional shortness of breath. States this started about 2 months ago and is not getting any better. She denies fever, confusion, weakness, or resting shortness of breath. No other associated symptoms.    8. Other fatigue  Ongoing and worsening over the last several years. States she is unable to stay energized throughout the day. States she feels exhausted after a day of household chores. States she never feels rested.      Relevant past medical, surgical, family, and social history reviewed and updated as indicated.  Allergies and medications reviewed and updated.   Past Medical History:  Diagnosis Date  . Diabetes mellitus   . GERD (gastroesophageal reflux disease)   . Hypertension   . Osteoporosis     Past Surgical History:  Procedure Laterality Date  . COLONOSCOPY N/A 03/29/2016   Procedure: COLONOSCOPY;  Surgeon: Danie Binder, MD;  Location: [facility];  Service: Endoscopy;  Laterality: N/A;  10:00 am  . ESOPHAGOGASTRODUODENOSCOPY N/A 03/29/2016   Procedure: ESOPHAGOGASTRODUODENOSCOPY (EGD);  Surgeon: Danie Binder, MD;  Location: [facility];  Service: Endoscopy;  Laterality:  N/A;  . TOTAL HIP ARTHROPLASTY  09/03/2011   Procedure: TOTAL HIP ARTHROPLASTY;  Surgeon: Mauri Pole, MD;  Location: [facility];  Service: Orthopedics;  Laterality: Left;    Social History   Socioeconomic History  . Marital status: Widowed    Spouse name: Not on file  . Number of children: Not on file  . Years of education: Not on file  . Highest education level: Not on file   Occupational History  . Not on file  Social Needs  . Financial resource strain: Not on file  . Food insecurity:    Worry: Not on file    Inability: Not on file  . Transportation needs:    Medical: Not on file    Non-medical: Not on file  Tobacco Use  . Smoking status: Never Smoker  . Smokeless tobacco: Never Used  Substance and Sexual Activity  . Alcohol use: Yes    Comment: occasional wine  . Drug use: No  . Sexual activity: Not on file  Lifestyle  . Physical activity:    Days per week: Not on file    Minutes per session: Not on file  . Stress: Not on file  Relationships  . Social connections:    Talks on phone: Not on file    Gets together: Not on file    Attends religious service: Not on file    Active member of club or organization: Not on file    Attends meetings of clubs or organizations: Not on file    Relationship status: Not on file  . Intimate partner violence:    Fear of current or ex partner: Not on file    Emotionally abused: Not on file    Physically abused: Not on file    Forced sexual activity: Not on file  Other Topics Concern  . Not on file  Social History Narrative  . Not on file    Outpatient Encounter Medications as of 12/11/2018  Medication Sig  . amLODipine (NORVASC) 5 MG tablet Take 1 tablet (5 mg total) by mouth daily.  Marland Kitchen aspirin EC 81 MG tablet Take 81 mg by mouth daily.  . benazepril (LOTENSIN) 40 MG tablet Take 1 tablet (40 mg total) by mouth daily.  . Blood Glucose Monitoring Suppl (ACCU-CHEK AVIVA) device Use as instructed  . Cholecalciferol (VITAMIN D-3 PO) Take 2,000 Units by mouth daily.  . fluticasone (FLONASE) 50 MCG/ACT nasal spray Place 2 sprays into both nostrils daily.  Marland Kitchen glucose blood (ACCU-CHEK AVIVA PLUS) test strip USE ONE STRIP TO CHECK GLUCOSE 4 TIMES DAILY  . glucose blood (ACCU-CHEK AVIVA PLUS) test strip 1 each by Other route 6 (six) times daily. USE TO CHECK GLUCOSE 6 TIMES DAILY  . hydrochlorothiazide (HYDRODIURIL)  25 MG tablet Take 1 tablet (25 mg total) by mouth daily.  . insulin NPH Human (HUMULIN N) 100 UNIT/ML injection INJECT 25 UNITS SUBCUTANEOUSLY IN THE MORNING AND 10 UNITS IN THE EVENING  . insulin regular (NOVOLIN R) 100 units/mL injection Inject 0.03 mLs (3 Units total) into the skin 3 (three) times daily before meals.  . lubiprostone (AMITIZA) 8 MCG capsule TAKE 1 CAPSULE BY MOUTH TWICE DAILY WITH A MEAL  . metFORMIN (GLUCOPHAGE) 500 MG tablet Take 2 tablets (1,000 mg total) by mouth 2 (two) times daily with a meal.  . metoprolol tartrate (LOPRESSOR) 25 MG tablet Take 1 tablet (25 mg total) by mouth 2 (two) times daily.  Marland Kitchen omeprazole (PRILOSEC) 40 MG capsule Take 1  capsule (40 mg total) by mouth 2 (two) times daily.  . pravastatin (PRAVACHOL) 40 MG tablet Take 1 tablet (40 mg total) by mouth daily.  . [DISCONTINUED] AMITIZA 8 MCG capsule TAKE 1 CAPSULE BY MOUTH TWICE DAILY WITH A MEAL  . [DISCONTINUED] amLODipine (NORVASC) 5 MG tablet Take 1 tablet (5 mg total) by mouth daily.  . [DISCONTINUED] benazepril (LOTENSIN) 40 MG tablet Take 1 tablet (40 mg total) by mouth daily.  . [DISCONTINUED] hydrochlorothiazide (HYDRODIURIL) 25 MG tablet Take 1 tablet (25 mg total) by mouth daily.  . [DISCONTINUED] insulin NPH Human (HUMULIN N) 100 UNIT/ML injection INJECT 25 UNITS SUBCUTANEOUSLY IN THE MORNING AND 10 UNITS IN THE EVENING  . [DISCONTINUED] insulin regular (NOVOLIN R,HUMULIN R) 100 units/mL injection Inject 0.03 mLs (3 Units total) into the skin 3 (three) times daily before meals.  . [DISCONTINUED] metFORMIN (GLUCOPHAGE) 500 MG tablet Take 2 tablets (1,000 mg total) by mouth 2 (two) times daily with a meal.  . [DISCONTINUED] metoprolol tartrate (LOPRESSOR) 25 MG tablet Take 1 tablet (25 mg total) by mouth 2 (two) times daily.  . [DISCONTINUED] omeprazole (PRILOSEC) 40 MG capsule TAKE 1 CAPSULE BY MOUTH TWICE DAILY  . [DISCONTINUED] pravastatin (PRAVACHOL) 40 MG tablet Take 1 tablet (40 mg total) by  mouth daily.  Marland Kitchen alendronate (FOSAMAX) 70 MG tablet TAKE ONE TABLET BY MOUTH ONCE A WEEK WITH A FULL GLASS OF WATER ON AN EMPTY STOMACH  . amoxicillin-clavulanate (AUGMENTIN) 875-125 MG tablet Take 1 tablet by mouth 2 (two) times daily for 7 days.  . [DISCONTINUED] alendronate (FOSAMAX) 70 MG tablet TAKE ONE TABLET BY MOUTH ONCE A WEEK WITH A FULL GLASS OF WATER ON AN EMPTY STOMACH (Patient not taking: Reported on 12/11/2018)   No facility-administered encounter medications on file as of 12/11/2018.     Allergies  Allergen Reactions  . Sulfa Antibiotics   . Sulfa Drugs Cross Reactors Rash    Review of Systems  Constitutional: Positive for fatigue. Negative for activity change, appetite change, chills, diaphoresis, fever and unexpected weight change.  HENT: Positive for congestion and postnasal drip. Negative for rhinorrhea, sinus pressure, sinus pain and sore throat.   Eyes: Negative for photophobia and visual disturbance.  Respiratory: Positive for cough, shortness of breath (exertional) and wheezing. Negative for apnea, choking, chest tightness and stridor.   Cardiovascular: Negative for chest pain, palpitations and leg swelling.  Gastrointestinal: Positive for constipation. Negative for abdominal distention, abdominal pain, anal bleeding, blood in stool, diarrhea, nausea, rectal pain and vomiting.  Endocrine: Negative for polydipsia, polyphagia and polyuria.  Genitourinary: Negative for decreased urine volume and difficulty urinating.  Musculoskeletal: Negative for arthralgias, back pain, gait problem, joint swelling, myalgias, neck pain and neck stiffness.  Neurological: Negative for dizziness, weakness, light-headedness and headaches.  Hematological: Does not bruise/bleed easily.  Psychiatric/Behavioral: Negative for confusion, dysphoric mood and sleep disturbance. The patient is not nervous/anxious.   All other systems reviewed and are negative.       Objective:  BP 135/63   Pulse  67   Temp 98.4 F (36.9 C) (Oral)   Ht 5' 5" (1.651 m)   Wt 136 lb (61.7 kg)   BMI 22.63 kg/m    Wt Readings from Last 3 Encounters:  12/11/18 136 lb (61.7 kg)  09/11/18 132 lb (59.9 kg)  08/26/18 134 lb (60.8 kg)    Physical Exam Vitals signs and nursing note reviewed.  Constitutional:      General: She is not in acute distress.  Appearance: Normal appearance. She is well-developed and well-groomed. She is not ill-appearing or toxic-appearing.  HENT:     Head: Normocephalic and atraumatic.     Jaw: There is normal jaw occlusion.     Right Ear: Hearing, tympanic membrane, ear canal and external ear normal.     Left Ear: Hearing, tympanic membrane, ear canal and external ear normal.     Nose: Nose normal.     Mouth/Throat:     Lips: Pink.     Mouth: Mucous membranes are moist.     Pharynx: Oropharynx is clear. Uvula midline.  Eyes:     General: Lids are normal.     Extraocular Movements: Extraocular movements intact.     Conjunctiva/sclera: Conjunctivae normal.     Pupils: Pupils are equal, round, and reactive to light.  Neck:     Musculoskeletal: Normal range of motion and neck supple.     Thyroid: No thyroid mass, thyromegaly or thyroid tenderness.     Vascular: No carotid bruit or JVD.     Trachea: Trachea and phonation normal.  Cardiovascular:     Rate and Rhythm: Normal rate and regular rhythm.     Pulses: Normal pulses.     Heart sounds: Normal heart sounds. No murmur. No friction rub. No gallop.   Pulmonary:     Effort: Pulmonary effort is normal. No respiratory distress.     Breath sounds: Wheezing (minimal scattered) present. No rhonchi or rales.  Abdominal:     General: Bowel sounds are normal. There is no distension or abdominal bruit.     Palpations: Abdomen is soft.     Tenderness: There is no abdominal tenderness. There is no right CVA tenderness or left CVA tenderness.     Hernia: No hernia is present.  Musculoskeletal:     Right lower leg: No  edema.     Left lower leg: No edema.  Lymphadenopathy:     Cervical: No cervical adenopathy.  Skin:    General: Skin is warm and dry.     Capillary Refill: Capillary refill takes less than 2 seconds.  Neurological:     General: No focal deficit present.     Mental Status: She is alert and oriented to person, place, and time.     Cranial Nerves: No cranial nerve deficit.     Sensory: No sensory deficit.     Motor: No weakness.     Coordination: Coordination normal.     Gait: Gait normal.     Deep Tendon Reflexes: Reflexes normal.  Psychiatric:        Attention and Perception: Attention and perception normal.        Mood and Affect: Mood and affect normal.        Speech: Speech normal.        Behavior: Behavior normal. Behavior is cooperative.        Thought Content: Thought content normal.        Cognition and Memory: Cognition and memory normal.        Judgment: Judgment normal.     Results for orders placed or performed in visit on 09/11/18  CBC with Differential/Platelet  Result Value Ref Range   WBC 5.0 3.4 - 10.8 x10E3/uL   RBC 3.68 (L) 3.77 - 5.28 x10E6/uL   Hemoglobin 11.5 11.1 - 15.9 g/dL   Hematocrit 34.4 34.0 - 46.6 %   MCV 94 79 - 97 fL   MCH 31.3 26.6 - 33.0 pg   MCHC  33.4 31.5 - 35.7 g/dL   RDW 12.2 11.7 - 15.4 %   Platelets 190 150 - 450 x10E3/uL   Neutrophils 40 Not Estab. %   Lymphs 40 Not Estab. %   Monocytes 7 Not Estab. %   Eos 9 Not Estab. %   Basos 4 Not Estab. %   Neutrophils Absolute 2.0 1.4 - 7.0 x10E3/uL   Lymphocytes Absolute 2.0 0.7 - 3.1 x10E3/uL   Monocytes Absolute 0.4 0.1 - 0.9 x10E3/uL   EOS (ABSOLUTE) 0.5 (H) 0.0 - 0.4 x10E3/uL   Basophils Absolute 0.2 0.0 - 0.2 x10E3/uL   Immature Granulocytes 0 Not Estab. %   Immature Grans (Abs) 0.0 0.0 - 0.1 x10E3/uL  CMP14+EGFR  Result Value Ref Range   Glucose 277 (H) 65 - 99 mg/dL   BUN 12 8 - 27 mg/dL   Creatinine, Ser 0.68 0.57 - 1.00 mg/dL   GFR calc non Af Amer 86 >59 mL/min/1.73    GFR calc Af Amer 100 >59 mL/min/1.73   BUN/Creatinine Ratio 18 12 - 28   Sodium 143 134 - 144 mmol/L   Potassium 4.6 3.5 - 5.2 mmol/L   Chloride 100 96 - 106 mmol/L   CO2 27 20 - 29 mmol/L   Calcium 9.4 8.7 - 10.3 mg/dL   Total Protein 6.4 6.0 - 8.5 g/dL   Albumin 4.1 3.7 - 4.7 g/dL   Globulin, Total 2.3 1.5 - 4.5 g/dL   Albumin/Globulin Ratio 1.8 1.2 - 2.2   Bilirubin Total 0.4 0.0 - 1.2 mg/dL   Alkaline Phosphatase 65 39 - 117 IU/L   AST 15 0 - 40 IU/L   ALT 12 0 - 32 IU/L  Lipid panel  Result Value Ref Range   Cholesterol, Total 151 100 - 199 mg/dL   Triglycerides 59 0 - 149 mg/dL   HDL 77 >39 mg/dL   VLDL Cholesterol Cal 12 5 - 40 mg/dL   LDL Calculated 62 0 - 99 mg/dL   Chol/HDL Ratio 2.0 0.0 - 4.4 ratio  Bayer DCA Hb A1c Waived  Result Value Ref Range   HB A1C (BAYER DCA - WAIVED) 7.9 (H) <7.0 %       Pertinent labs & imaging results that were available during my care of the patient were reviewed by me and considered in my medical decision making.  Assessment & Plan:  Susan Petty was seen today for medical management of chronic issues.  Diagnoses and all orders for this visit:  Type 2 diabetes mellitus with insulin therapy ([place]) A1C 7.8 in office today. Diet and exercise encouraged. Continue below.  -     Bayer DCA Hb A1c Waived -     insulin NPH Human (HUMULIN N) 100 UNIT/ML injection; INJECT 25 UNITS SUBCUTANEOUSLY IN THE MORNING AND 10 UNITS IN THE EVENING -     insulin regular (NOVOLIN R) 100 units/mL injection; Inject 0.03 mLs (3 Units total) into the skin 3 (three) times daily before meals. -     metFORMIN (GLUCOPHAGE) 500 MG tablet; Take 2 tablets (1,000 mg total) by mouth 2 (two) times daily with a meal. -     CMP14+EGFR  Age-related osteoporosis without current pathological fracture Will restart Fosamax. Calcium supplement daily. Report any jaw pain, loose teeth, or trouble chewing.  -     alendronate (FOSAMAX) 70 MG tablet; TAKE ONE TABLET BY MOUTH ONCE A WEEK  WITH A FULL GLASS OF WATER ON AN EMPTY STOMACH -     VITAMIN D 25  Hydroxy (Vit-D Deficiency, Fractures)  Hyperlipidemia associated with type 2 diabetes mellitus ([place]) Diet and exercise encouraged. Labs pending. Continue below.  -     pravastatin (PRAVACHOL) 40 MG tablet; Take 1 tablet (40 mg total) by mouth daily. -     Lipid panel  Hypertension associated with type 2 diabetes mellitus ([place]) Well controlled. Diet and exercise encouraged. Continue below.  -     amLODipine (NORVASC) 5 MG tablet; Take 1 tablet (5 mg total) by mouth daily. -     benazepril (LOTENSIN) 40 MG tablet; Take 1 tablet (40 mg total) by mouth daily. -     hydrochlorothiazide (HYDRODIURIL) 25 MG tablet; Take 1 tablet (25 mg total) by mouth daily. -     metoprolol tartrate (LOPRESSOR) 25 MG tablet; Take 1 tablet (25 mg total) by mouth 2 (two) times daily. -     CBC with Differential/Platelet  Gastroesophageal reflux disease without esophagitis Well controlled. Continue below.  -     omeprazole (PRILOSEC) 40 MG capsule; Take 1 capsule (40 mg total) by mouth 2 (two) times daily. -     CBC with Differential/Platelet  Irritable bowel syndrome with constipation Doing well on Amitiza, will continue.  -     lubiprostone (AMITIZA) 8 MCG capsule; TAKE 1 CAPSULE BY MOUTH TWICE DAILY WITH A MEAL  Acute bacterial bronchitis Reported symptoms concerning for bacterial bronchitis. Will treat with below. Symptomatic care discussed. Report any new or worsening symptoms.  -     amoxicillin-clavulanate (AUGMENTIN) 875-125 MG tablet; Take 1 tablet by mouth 2 (two) times daily for 7 days.  Other fatigue Sleep hygiene discussed. Labs pending. Report any new or worsening symptoms.  -     VITAMIN D 25 Hydroxy (Vit-D Deficiency, Fractures) -     Vitamin B12 -     TSH -     CBC with Differential/Platelet     Continue all other maintenance medications.  Follow up plan: Return in about 3 months (around 03/13/2019), or if symptoms worsen  or fail to improve, for DM.   The above assessment and management plan was discussed with the patient. The patient verbalized understanding of and has agreed to the management plan. Patient is aware to call the clinic if symptoms persist or worsen. Patient is aware when to return to the clinic for a follow-up visit. Patient educated on when it is appropriate to go to the emergency department.   Monia Pouch, FNP-C [facility] [phone]

## 2018-12-12 LAB — CMP14+EGFR
ALT: 17 IU/L (ref 0–32)
AST: 21 IU/L (ref 0–40)
Albumin/Globulin Ratio: 2.2 (ref 1.2–2.2)
Albumin: 4.4 g/dL (ref 3.7–4.7)
Alkaline Phosphatase: 68 IU/L (ref 39–117)
BUN/Creatinine Ratio: 20 (ref 12–28)
BUN: 15 mg/dL (ref 8–27)
Bilirubin Total: 0.4 mg/dL (ref 0.0–1.2)
CO2: 30 mmol/L — ABNORMAL HIGH (ref 20–29)
Calcium: 9.4 mg/dL (ref 8.7–10.3)
Chloride: 101 mmol/L (ref 96–106)
Creatinine, Ser: 0.75 mg/dL (ref 0.57–1.00)
GFR calc Af Amer: 90 mL/min/{1.73_m2} (ref >59)
GFR calc non Af Amer: 78 mL/min/{1.73_m2} (ref >59)
Globulin, Total: 2 g/dL (ref 1.5–4.5)
Glucose: 208 mg/dL — ABNORMAL HIGH (ref 65–99)
Potassium: 4.3 mmol/L (ref 3.5–5.2)
Sodium: 142 mmol/L (ref 134–144)
Total Protein: 6.4 g/dL (ref 6.0–8.5)

## 2018-12-12 LAB — CBC WITH DIFFERENTIAL/PLATELET
Basophils Absolute: 0.1 10*3/uL (ref 0.0–0.2)
Basos: 3 %
EOS (ABSOLUTE): 0.4 10*3/uL (ref 0.0–0.4)
Eos: 8 %
Hematocrit: 34.7 % (ref 34.0–46.6)
Hemoglobin: 11.8 g/dL (ref 11.1–15.9)
Immature Grans (Abs): 0 10*3/uL (ref 0.0–0.1)
Immature Granulocytes: 0 %
Lymphocytes Absolute: 1.8 10*3/uL (ref 0.7–3.1)
Lymphs: 35 %
MCH: 31.6 pg (ref 26.6–33.0)
MCHC: 34 g/dL (ref 31.5–35.7)
MCV: 93 fL (ref 79–97)
Monocytes Absolute: 0.4 10*3/uL (ref 0.1–0.9)
Monocytes: 8 %
Neutrophils Absolute: 2.3 10*3/uL (ref 1.4–7.0)
Neutrophils: 46 %
Platelets: 196 10*3/uL (ref 150–450)
RBC: 3.74 x10E6/uL — ABNORMAL LOW (ref 3.77–5.28)
RDW: 12.8 % (ref 11.7–15.4)
WBC: 5 10*3/uL (ref 3.4–10.8)

## 2018-12-12 LAB — VITAMIN D 25 HYDROXY (VIT D DEFICIENCY, FRACTURES): Vit D, 25-Hydroxy: 70.9 ng/mL (ref 30.0–100.0)

## 2018-12-12 LAB — LIPID PANEL
Chol/HDL Ratio: 1.9 ratio (ref 0.0–4.4)
Cholesterol, Total: 163 mg/dL (ref 100–199)
HDL: 88 mg/dL (ref >39)
LDL Calculated: 64 mg/dL (ref 0–99)
Triglycerides: 57 mg/dL (ref 0–149)
VLDL Cholesterol Cal: 11 mg/dL (ref 5–40)

## 2018-12-12 LAB — VITAMIN B12: Vitamin B-12: 404 pg/mL (ref 232–1245)

## 2018-12-12 LAB — TSH: TSH: 4.14 u[IU]/mL (ref 0.450–4.500)

## 2019-01-28 ENCOUNTER — Other Ambulatory Visit: Payer: Self-pay | Admitting: Family Medicine

## 2019-01-28 ENCOUNTER — Encounter: Payer: Self-pay | Admitting: Family Medicine

## 2019-01-28 DIAGNOSIS — K5909 Other constipation: Secondary | ICD-10-CM

## 2019-01-28 MED ORDER — POLYETHYLENE GLYCOL 3350 17 GM/SCOOP PO POWD: 17.0000 g | Freq: Two times a day (BID) | ORAL | 1 refills | Status: DC | PRN

## 2019-02-09 ENCOUNTER — Encounter: Payer: Self-pay | Admitting: Family Medicine

## 2019-02-09 ENCOUNTER — Ambulatory Visit (INDEPENDENT_AMBULATORY_CARE_PROVIDER_SITE_OTHER): Payer: Medicare Other | Admitting: Family

## 2019-02-09 ENCOUNTER — Other Ambulatory Visit: Payer: Self-pay

## 2019-02-09 ENCOUNTER — Ambulatory Visit (INDEPENDENT_AMBULATORY_CARE_PROVIDER_SITE_OTHER): Payer: Medicare Other | Admitting: Family Medicine

## 2019-02-09 ENCOUNTER — Encounter: Payer: Self-pay | Admitting: Family

## 2019-02-09 ENCOUNTER — Ambulatory Visit (INDEPENDENT_AMBULATORY_CARE_PROVIDER_SITE_OTHER): Payer: Medicare Other

## 2019-02-09 VITALS — BP 147/68 | HR 72 | Temp 98.4°F | Ht 65.0 in | Wt 138.2 lb

## 2019-02-09 DIAGNOSIS — J0100 Acute maxillary sinusitis, unspecified: Secondary | ICD-10-CM

## 2019-02-09 DIAGNOSIS — M542 Cervicalgia: Secondary | ICD-10-CM | POA: Diagnosis not present

## 2019-02-09 MED ORDER — AMOXICILLIN-POT CLAVULANATE 875-125 MG PO TABS: 1.0000 | ORAL_TABLET | Freq: Two times a day (BID) | ORAL | 0 refills | Status: DC

## 2019-02-09 MED ORDER — DICLOFENAC SODIUM 75 MG PO TBEC: 75.0000 mg | DELAYED_RELEASE_TABLET | Freq: Two times a day (BID) | ORAL | 0 refills | Status: DC

## 2019-02-09 MED ORDER — BACLOFEN 10 MG PO TABS: 10.0000 mg | ORAL_TABLET | Freq: Three times a day (TID) | ORAL | 0 refills | Status: DC

## 2019-02-09 NOTE — Progress Notes (Signed)
Pt complaining of neck pain and wants to be seen in office for xrays. Provider is working remotely today. Evelina Dun, FNP, is in office and will see pt today. Pt aware and happy with this plan.  Pt rescheduled.

## 2019-02-09 NOTE — Progress Notes (Signed)
Subjective:    Patient ID: Susan Petty, female    DOB: [birth year], 73 y.o.   MRN: [id number]  Chief Complaint  Patient presents with  . back of neck pain    Neck Pain  This is a recurrent problem. The current episode started in the past 7 days. The problem occurs intermittently. The problem has been waxing and waning. The pain is associated with nothing. The quality of the pain is described as aching. The pain is at a severity of 10/10. The pain is severe. The symptoms are aggravated by bending and coughing. The pain is worse during the night. Associated symptoms include headaches. Pertinent negatives include no fever, leg pain, numbness, photophobia, syncope, tingling, trouble swallowing or visual change. She has tried ice and heat for the symptoms. The treatment provided mild relief.  Sinusitis This is a recurrent problem. The current episode started 1 to 4 weeks ago. The problem has been waxing and waning since onset. There has been no fever. Her pain is at a severity of 6/10. The pain is mild. Associated symptoms include congestion, headaches, neck pain, sinus pressure, sneezing and a sore throat. Pertinent negatives include no coughing, ear pain or hoarse voice. Past treatments include oral decongestants. The treatment provided mild relief.      Review of Systems  Constitutional: Negative for fever.  HENT: Positive for congestion, sinus pressure, sneezing and sore throat. Negative for ear pain, hoarse voice and trouble swallowing.   Eyes: Negative for photophobia.  Respiratory: Negative for cough.   Cardiovascular: Negative for syncope.  Musculoskeletal: Positive for neck pain.  Neurological: Positive for headaches. Negative for tingling and numbness.  All other systems reviewed and are negative.      Objective:   Physical Exam Vitals signs reviewed.  Constitutional:      General: She is not in acute distress.    Appearance: She is well-developed.  HENT:     Head:  Normocephalic and atraumatic.     Right Ear: Tympanic membrane normal.     Left Ear: Tympanic membrane normal.     Nose:     Right Sinus: Maxillary sinus tenderness present.     Left Sinus: Maxillary sinus tenderness present.  Eyes:     Pupils: Pupils are equal, round, and reactive to light.  Neck:     Musculoskeletal: Muscular tenderness present.     Thyroid: No thyromegaly.     Comments: Decreased ROM, pt can not rotate greater than 20 degrees either direction, pain with flexion or extension Cardiovascular:     Rate and Rhythm: Normal rate and regular rhythm.     Heart sounds: Normal heart sounds. No murmur.  Pulmonary:     Effort: Pulmonary effort is normal. No respiratory distress.     Breath sounds: Normal breath sounds. No wheezing.  Musculoskeletal: Normal range of motion.        General: No tenderness.  Skin:    General: Skin is warm and dry.  Neurological:     Mental Status: She is alert and oriented to person, place, and time.     Cranial Nerves: No cranial nerve deficit.     Deep Tendon Reflexes: Reflexes are normal and symmetric.  Psychiatric:        Behavior: Behavior normal.        Thought Content: Thought content normal.        Judgment: Judgment normal.       BP (!) 147/68   Pulse 72   Temp  98.4 F (36.9 C) (Temporal)   Ht 5\' 5"  (1.651 m)   Wt 138 lb 3.2 oz (62.7 kg)   BMI 23.00 kg/m      Assessment & Plan:  Susan Petty comes in today with chief complaint of back of neck pain   Diagnosis and orders addressed:  1. Neck pain Rest Ice  ROM exercises encouraged Sedation precautions discussed RTO if symptoms worsen or do not improve  - DG Cervical Spine Complete; Future - diclofenac (VOLTAREN) 75 MG EC tablet; Take 1 tablet (75 mg total) by mouth 2 (two) times daily.  Dispense: 15 tablet; Refill: 0 - baclofen (LIORESAL) 10 MG tablet; Take 1 tablet (10 mg total) by mouth 3 (three) times daily.  Dispense: 30 each; Refill: 0  2. Acute  non-recurrent maxillary sinusitis - Take meds as prescribed - Use a cool mist humidifier  -Use saline nose sprays frequently -Force fluids -For any cough or congestion  Use plain Mucinex- regular strength or max strength is fine -For fever or aces or pains- take tylenol or ibuprofen. -Throat lozenges if help - amoxicillin-clavulanate (AUGMENTIN) 875-125 MG tablet; Take 1 tablet by mouth 2 (two) times daily.  Dispense: 14 tablet; Refill: 0   Evelina Dun, FNP

## 2019-02-09 NOTE — Patient Instructions (Signed)

## 2019-02-17 ENCOUNTER — Ambulatory Visit (INDEPENDENT_AMBULATORY_CARE_PROVIDER_SITE_OTHER): Payer: Medicare Other | Admitting: Family Medicine

## 2019-02-17 DIAGNOSIS — J0100 Acute maxillary sinusitis, unspecified: Secondary | ICD-10-CM | POA: Diagnosis not present

## 2019-02-17 MED ORDER — DOXYCYCLINE HYCLATE 100 MG PO TABS: 100.0000 mg | ORAL_TABLET | Freq: Two times a day (BID) | ORAL | 0 refills | Status: AC

## 2019-02-17 MED ORDER — AZELASTINE HCL 0.1 % NA SOLN: 1.0000 | Freq: Two times a day (BID) | NASAL | 12 refills | Status: DC

## 2019-02-17 NOTE — Progress Notes (Signed)
Telephone visit  Subjective: CC: sinus infection PCP: Baruch Gouty, FNP [id number] Susan Petty is a 73 y.o. female calls for telephone consult today. Patient provides verbal consent for consult held via phone.  Location of patient: home Location of provider: [facility] Others present for call: none  1. Sinus symptoms Patient reports sinus headache, nasal drainage and facial pain that onset about 3 weeks.  No fever, shortness of breath.  Occasional cough from drainage.  She was taking Augmentin that wast prescribed 10 days ago but this may no improvement in symptoms.  No hemoptysis.  Using claritin.   ROS: Per HPI  Allergies  Allergen Reactions  . Sulfa Antibiotics   . Sulfa Drugs Cross Reactors Rash   Past Medical History:  Diagnosis Date  . Diabetes mellitus   . GERD (gastroesophageal reflux disease)   . Hypertension   . Osteoporosis     Current Outpatient Medications:  .  alendronate (FOSAMAX) 70 MG tablet, TAKE ONE TABLET BY MOUTH ONCE A WEEK WITH A FULL GLASS OF WATER ON AN EMPTY STOMACH, Disp: 4 tablet, Rfl: 2 .  amLODipine (NORVASC) 5 MG tablet, Take 1 tablet (5 mg total) by mouth daily., Disp: 90 tablet, Rfl: 1 .  amoxicillin-clavulanate (AUGMENTIN) 875-125 MG tablet, Take 1 tablet by mouth 2 (two) times daily., Disp: 14 tablet, Rfl: 0 .  aspirin EC 81 MG tablet, Take 81 mg by mouth daily., Disp: , Rfl:  .  baclofen (LIORESAL) 10 MG tablet, Take 1 tablet (10 mg total) by mouth 3 (three) times daily., Disp: 30 each, Rfl: 0 .  benazepril (LOTENSIN) 40 MG tablet, Take 1 tablet (40 mg total) by mouth daily., Disp: 90 tablet, Rfl: 1 .  Blood Glucose Monitoring Suppl (ACCU-CHEK AVIVA) device, Use as instructed, Disp: 1 each, Rfl: 0 .  Cholecalciferol (VITAMIN D-3 PO), Take 2,000 Units by mouth daily., Disp: , Rfl:  .  diclofenac (VOLTAREN) 75 MG EC tablet, Take 1 tablet (75 mg total) by mouth 2 (two) times daily., Disp: 15 tablet, Rfl: 0 .  fluticasone (FLONASE) 50 MCG/ACT nasal  spray, Place 2 sprays into both nostrils daily., Disp: 16 g, Rfl: 6 .  glucose blood (ACCU-CHEK AVIVA PLUS) test strip, USE ONE STRIP TO CHECK GLUCOSE 4 TIMES DAILY, Disp: 150 each, Rfl: 1 .  glucose blood (ACCU-CHEK AVIVA PLUS) test strip, 1 each by Other route 6 (six) times daily. USE TO CHECK GLUCOSE 6 TIMES DAILY, Disp: 600 each, Rfl: 3 .  hydrochlorothiazide (HYDRODIURIL) 25 MG tablet, Take 1 tablet (25 mg total) by mouth daily., Disp: 90 tablet, Rfl: 1 .  insulin NPH Human (HUMULIN N) 100 UNIT/ML injection, INJECT 25 UNITS SUBCUTANEOUSLY IN THE MORNING AND 10 UNITS IN THE EVENING, Disp: 30 mL, Rfl: 3 .  insulin regular (NOVOLIN R) 100 units/mL injection, Inject 0.03 mLs (3 Units total) into the skin 3 (three) times daily before meals., Disp: 7.5 mL, Rfl: 1 .  lubiprostone (AMITIZA) 8 MCG capsule, TAKE 1 CAPSULE BY MOUTH TWICE DAILY WITH A MEAL, Disp: 180 capsule, Rfl: 3 .  metFORMIN (GLUCOPHAGE) 500 MG tablet, Take 2 tablets (1,000 mg total) by mouth 2 (two) times daily with a meal., Disp: 360 tablet, Rfl: 1 .  metoprolol tartrate (LOPRESSOR) 25 MG tablet, Take 1 tablet (25 mg total) by mouth 2 (two) times daily., Disp: 180 tablet, Rfl: 1 .  omeprazole (PRILOSEC) 40 MG capsule, Take 1 capsule (40 mg total) by mouth 2 (two) times daily., Disp: 180 capsule, Rfl: 3 .  polyethylene glycol powder (GLYCOLAX/MIRALAX) 17 GM/SCOOP powder, Take 17 g by mouth 2 (two) times daily as needed., Disp: 3350 g, Rfl: 1 .  pravastatin (PRAVACHOL) 40 MG tablet, Take 1 tablet (40 mg total) by mouth daily., Disp: 90 tablet, Rfl: 1  Assessment/ Plan: 73 y.o. female   1. Subacute maxillary sinusitis We discussed that there is a strong possibility this is not a bacterial sinus infection.  We discussed allergic vs viral etiology.  Given worsening symptoms, will change to doxy and add astelin.  If no improvement or worsening, advised to seek in person evaluation.  COVID 19 precautions reviewed.  Follow up prn. -  azelastine (ASTELIN) 0.1 % nasal spray; Place 1 spray into both nostrils 2 (two) times daily.  Dispense: 30 mL; Refill: 12 - doxycycline (VIBRA-TABS) 100 MG tablet; Take 1 tablet (100 mg total) by mouth 2 (two) times daily for 7 days.  Dispense: 14 tablet; Refill: 0   Start time: 4:54pm End time: 4:59pm  Total time spent on patient care (including telephone call/ virtual visit): 12 minutes  [place], [place] [phone]

## 2019-02-17 NOTE — Patient Instructions (Signed)
Prevent the Spread of COVID-19 if You Are Sick If you are sick with COVID-19 or think you might have COVID-19, follow the steps below to help protect other people in your home and community. Stay home except to get medical care.  Stay home. Most people with COVID-19 have mild illness and are able to recover at home without medical care. Do not leave your home, except to get medical care. Do not visit public areas.  Take care of yourself. Get rest and stay hydrated.  Get medical care when needed. Call your doctor before you go to their office for care. But, if you have trouble breathing or other concerning symptoms, call 911 for immediate help.  Avoid public transportation, ride-sharing, or taxis. Separate yourself from other people and pets in your home.  As much as possible, stay in a specific room and away from other people and pets in your home. Also, you should use a separate bathroom, if available. If you need to be around other people or animals in or outside of the home, wear a cloth face covering. ? See COVID-19 and Animals if you have questions about pets: https://www.cdc.gov/coronavirus/2019-ncov/faq.html#COVID19animals Monitor your symptoms.  Common symptoms of COVID-19 include fever and cough. Trouble breathing is a more serious symptom that means you should get medical attention.  Follow care instructions from your healthcare provider and local health department. Your local health authorities will give instructions on checking your symptoms and reporting information. If you develop emergency warning signs for COVID-19 get medical attention immediately.  Emergency warning signs include*:  Trouble breathing  Persistent pain or pressure in the chest  New confusion or not able to be woken  Bluish lips or face *This list is not all inclusive. Please consult your medical provider for any other symptoms that are severe or concerning to you. Call 911 if you have a medical  emergency. If you have a medical emergency and need to call 911, notify the operator that you have or think you might have, COVID-19. If possible, put on a facemask before medical help arrives. Call ahead before visiting your doctor.  Call ahead. Many medical visits for routine care are being postponed or done by phone or telemedicine.  If you have a medical appointment that cannot be postponed, call your doctor's office. This will help the office protect themselves and other patients. If you are sick, wear a cloth covering over your nose and mouth.  You should wear a cloth face covering over your nose and mouth if you must be around other people or animals, including pets (even at home).  You don't need to wear the cloth face covering if you are alone. If you can't put on a cloth face covering (because of trouble breathing for example), cover your coughs and sneezes in some other way. Try to stay at least 6 feet away from other people. This will help protect the people around you. Note: During the COVID-19 pandemic, medical grade facemasks are reserved for healthcare workers and some first responders. You may need to make a cloth face covering using a scarf or bandana. Cover your coughs and sneezes.  Cover your mouth and nose with a tissue when you cough or sneeze.  Throw used tissues in a lined trash can.  Immediately wash your hands with soap and water for at least 20 seconds. If soap and water are not available, clean your hands with an alcohol-based hand sanitizer that contains at least 60% alcohol. Clean your hands often.    Wash your hands often with soap and water for at least 20 seconds. This is especially important after blowing your nose, coughing, or sneezing; going to the bathroom; and before eating or preparing food.  Use hand sanitizer if soap and water are not available. Use an alcohol-based hand sanitizer with at least 60% alcohol, covering all surfaces of your hands and rubbing  them together until they feel dry.  Soap and water are the best option, especially if your hands are visibly dirty.  Avoid touching your eyes, nose, and mouth with unwashed hands. Avoid sharing personal household items.  Do not share dishes, drinking glasses, cups, eating utensils, towels, or bedding with other people in your home.  Wash these items thoroughly after using them with soap and water or put them in the dishwasher. Clean all "high-touch" surfaces everyday.  Clean and disinfect high-touch surfaces in your "sick room" and bathroom. Let someone else clean and disinfect surfaces in common areas, but not your bedroom and bathroom.  If a caregiver or other person needs to clean and disinfect a sick person's bedroom or bathroom, they should do so on an as-needed basis. The caregiver/other person should wear a mask and wait as long as possible after the sick person has used the bathroom. High-touch surfaces include phones, remote controls, counters, tabletops, doorknobs, bathroom fixtures, toilets, keyboards, tablets, and bedside tables.  Clean and disinfect areas that may have blood, stool, or body fluids on them.  Use household cleaners and disinfectants. Clean the area or item with soap and water or another detergent if it is dirty. Then use a household disinfectant. ? Be sure to follow the instructions on the label to ensure safe and effective use of the product. Many products recommend keeping the surface wet for several minutes to ensure germs are killed. Many also recommend precautions such as wearing gloves and making sure you have good ventilation during use of the product. ? Most EPA-registered household disinfectants should be effective. How to discontinue home isolation  People with COVID-19 who have stayed home (home isolated) can stop home isolation under the following conditions: ? If you will not have a test to determine if you are still contagious, you can leave home  after these three things have happened:  You have had no fever for at least 72 hours (that is three full days of no fever without the use of medicine that reduces fevers) AND  other symptoms have improved (for example, when your cough or shortness of breath has improved) AND  at least 10 days have passed since your symptoms first appeared. ? If you will be tested to determine if you are still contagious, you can leave home after these three things have happened:  You no longer have a fever (without the use of medicine that reduces fevers) AND  other symptoms have improved (for example, when your cough or shortness of breath has improved) AND  you received two negative tests in a row, 24 hours apart. Your doctor will follow CDC guidelines. In all cases, follow the guidance of your healthcare provider and local health department. The decision to stop home isolation should be made in consultation with your healthcare provider and state and local health departments. Local decisions depend on local circumstances. cdc.gov/coronavirus 11/08/2018 This information is not intended to replace advice given to you by your health care provider. Make sure you discuss any questions you have with your health care provider. Document Released: 10/20/2018 Document Revised: 11/18/2018 Document Reviewed: 10/20/2018   Elsevier Patient Education  2020 [employer]  

## 2019-03-04 ENCOUNTER — Ambulatory Visit: Payer: Medicare Other | Admitting: Family Medicine

## 2019-03-04 ENCOUNTER — Other Ambulatory Visit: Payer: Self-pay

## 2019-03-08 ENCOUNTER — Telehealth: Payer: Self-pay | Admitting: Gastroenterology

## 2019-03-08 ENCOUNTER — Other Ambulatory Visit: Payer: Self-pay

## 2019-03-08 ENCOUNTER — Ambulatory Visit: Payer: Medicare Other | Admitting: Gastroenterology

## 2019-03-08 ENCOUNTER — Encounter: Payer: Self-pay | Admitting: Gastroenterology

## 2019-03-08 VITALS — BP 170/68 | HR 63 | Temp 97.0°F | Ht 65.0 in | Wt 138.8 lb

## 2019-03-08 DIAGNOSIS — R140 Abdominal distension (gaseous): Secondary | ICD-10-CM

## 2019-03-08 DIAGNOSIS — K219 Gastro-esophageal reflux disease without esophagitis: Secondary | ICD-10-CM

## 2019-03-08 DIAGNOSIS — K5900 Constipation, unspecified: Secondary | ICD-10-CM | POA: Diagnosis not present

## 2019-03-08 MED ORDER — RABEPRAZOLE SODIUM 20 MG PO TBEC: 20.0000 mg | DELAYED_RELEASE_TABLET | Freq: Two times a day (BID) | ORAL | 0 refills | Status: DC

## 2019-03-08 NOTE — Assessment & Plan Note (Signed)
Difficult to manage.  Remains in adequately controlled especially with nighttime symptoms.  Regurgitation and burning during the night.  Has slept up in a recliner for the last several days.  Worse over the past several weeks.  She has a snack around 8 PM to keep her sugar steady through the night.  Has taken away nighttime metformin.  Sometimes has to get up in the melanite to get her sugars up.  Typically eats banana.  Now avoiding tomato juice.  Complains of some early satiety and postprandial bloating. Has had symptoms similar last year.   Switch omeprazole to generic AcipHex 20 mg twice daily before meals.  Reinforced antireflux and bloating measures.

## 2019-03-08 NOTE — Assessment & Plan Note (Signed)
May be contributing to her bloating.  Amitiza has been difficult to afford as outlined above.  She would like to switch to something cheaper.  She has never tried MiraLAX per her report.  We will give this a try, twice daily initially until soft regular bowel movement, then daily as needed.  She will call me in a couple weeks for a progress report.  If ongoing bloating at that point would recommend CT abdomen pelvis with contrast to rule out underlying issues i.e. malignancy.

## 2019-03-08 NOTE — Telephone Encounter (Signed)
On recall  °
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
## 2019-03-08 NOTE — Patient Instructions (Addendum)
1. Stop omeprazole.  Start generic AcipHex 20 mg twice daily before a meal.  Prescription sent to your pharmacy.  Let me know if you continue to have reflux issues. 2. Since Amitiza is so expensive, you can switch to MiraLAX 17 g (1 capful) twice daily until regular bowel movement, then decrease to once daily as needed. 3. Call in 2 weeks and let me know if your bloating has gotten any better with better management of your constipation. If you continue to have bloating, would recommend CT scan of your abdomen.   Food Choices for Gastroesophageal Reflux Disease, Adult When you have gastroesophageal reflux disease (GERD), the foods you eat and your eating habits are very important. Choosing the right foods can help ease the discomfort of GERD. Consider working with a diet and nutrition specialist (dietitian) to help you make healthy food choices. What general guidelines should I follow?  Eating plan  Choose healthy foods low in fat, such as fruits, vegetables, whole grains, low-fat dairy products, and lean meat, fish, and poultry.  Eat frequent, small meals instead of three large meals each day. Eat your meals slowly, in a relaxed setting. Avoid bending over or lying down until 2-3 hours after eating.  Limit high-fat foods such as fatty meats or fried foods.  Limit your intake of oils, butter, and shortening to less than 8 teaspoons each day.  Avoid the following: ? Foods that cause symptoms. These may be different for different people. Keep a food diary to keep track of foods that cause symptoms. ? Alcohol. ? Drinking large amounts of liquid with meals. ? Eating meals during the 2-3 hours before bed.  Cook foods using methods other than frying. This may include baking, grilling, or broiling. Lifestyle  Maintain a healthy weight. Ask your health care provider what weight is healthy for you. If you need to lose weight, work with your health care provider to do so safely.  Exercise for at  least 30 minutes on 5 or more days each week, or as told by your health care provider.  Avoid wearing clothes that fit tightly around your waist and chest.  Do not use any products that contain nicotine or tobacco, such as cigarettes and e-cigarettes. If you need help quitting, ask your health care provider.  Sleep with the head of your bed raised. Use a wedge under the mattress or blocks under the bed frame to raise the head of the bed. What foods are not recommended? The items listed may not be a complete list. Talk with your dietitian about what dietary choices are best for you. Grains Pastries or quick breads with added fat. [place] toast. Vegetables Deep fried vegetables. [place] fries. Any vegetables prepared with added fat. Any vegetables that cause symptoms. For some people this may include tomatoes and tomato products, chili peppers, onions and garlic, and horseradish. Fruits Any fruits prepared with added fat. Any fruits that cause symptoms. For some people this may include citrus fruits, such as oranges, grapefruit, pineapple, and lemons. Meats and other protein foods High-fat meats, such as fatty beef or pork, hot dogs, ribs, ham, sausage, salami and bacon. Fried meat or protein, including fried fish and fried chicken. Nuts and nut butters. Dairy Whole milk and chocolate milk. Sour cream. Cream. Ice cream. Cream cheese. Milk shakes. Beverages Coffee and tea, with or without caffeine. Carbonated beverages. Sodas. Energy drinks. Fruit juice made with acidic fruits (such as orange or grapefruit). Tomato juice. Alcoholic drinks. Fats and oils Butter.  Margarine. Shortening. Ghee. Sweets and desserts Chocolate and cocoa. Donuts. Seasoning and other foods Pepper. Peppermint and spearmint. Any condiments, herbs, or seasonings that cause symptoms. For some people, this may include curry, hot sauce, or vinegar-based salad dressings. Summary  When you have gastroesophageal reflux disease  (GERD), food and lifestyle choices are very important to help ease the discomfort of GERD.  Eat frequent, small meals instead of three large meals each day. Eat your meals slowly, in a relaxed setting. Avoid bending over or lying down until 2-3 hours after eating.  Limit high-fat foods such as fatty meat or fried foods. This information is not intended to replace advice given to you by your health care provider. Make sure you discuss any questions you have with your health care provider. Document Released: 06/24/2005 Document Revised: 10/15/2018 Document Reviewed: 06/25/2016 Elsevier Patient Education  [place].   Gastroesophageal Reflux Disease, Adult Gastroesophageal reflux (GER) happens when acid from the stomach flows up into the tube that connects the mouth and the stomach (esophagus). Normally, food travels down the esophagus and stays in the stomach to be digested. However, when a person has GER, food and stomach acid sometimes move back up into the esophagus. If this becomes a more serious problem, the person may be diagnosed with a disease called gastroesophageal reflux disease (GERD). GERD occurs when the reflux:  Happens often.  Causes frequent or severe symptoms.  Causes problems such as damage to the esophagus. When stomach acid comes in contact with the esophagus, the acid may cause soreness (inflammation) in the esophagus. Over time, GERD may create small holes (ulcers) in the lining of the esophagus. What are the causes? This condition is caused by a problem with the muscle between the esophagus and the stomach (lower esophageal sphincter, or LES). Normally, the LES muscle closes after food passes through the esophagus to the stomach. When the LES is weakened or abnormal, it does not close properly, and that allows food and stomach acid to go back up into the esophagus. The LES can be weakened by certain dietary substances, medicines, and medical conditions,  including:  Tobacco use.  Pregnancy.  Having a hiatal hernia.  Alcohol use.  Certain foods and beverages, such as coffee, chocolate, onions, and peppermint. What increases the risk? You are more likely to develop this condition if you:  Have an increased body weight.  Have a connective tissue disorder.  Use NSAID medicines. What are the signs or symptoms? Symptoms of this condition include:  Heartburn.  Difficult or painful swallowing.  The feeling of having a lump in the throat.  Abitter taste in the mouth.  Bad breath.  Having a large amount of saliva.  Having an upset or bloated stomach.  Belching.  Chest pain. Different conditions can cause chest pain. Make sure you see your health care provider if you experience chest pain.  Shortness of breath or wheezing.  Ongoing (chronic) cough or a night-time cough.  Wearing away of tooth enamel.  Weight loss. How is this diagnosed? Your health care provider will take a medical history and perform a physical exam. To determine if you have mild or severe GERD, your health care provider may also monitor how you respond to treatment. You may also have tests, including:  A test to examine your stomach and esophagus with a small camera (endoscopy).  A test thatmeasures the acidity level in your esophagus.  A test thatmeasures how much pressure is on your esophagus.  A barium  swallow or modified barium swallow test to show the shape, size, and functioning of your esophagus. How is this treated? The goal of treatment is to help relieve your symptoms and to prevent complications. Treatment for this condition may vary depending on how severe your symptoms are. Your health care provider may recommend:  Changes to your diet.  Medicine.  Surgery. Follow these instructions at home: Eating and drinking   Follow a diet as recommended by your health care provider. This may involve avoiding foods and drinks such  as: ? Coffee and tea (with or without caffeine). ? Drinks that containalcohol. ? Energy drinks and sports drinks. ? Carbonated drinks or sodas. ? Chocolate and cocoa. ? Peppermint and mint flavorings. ? Garlic and onions. ? Horseradish. ? Spicy and acidic foods, including peppers, chili powder, curry powder, vinegar, hot sauces, and barbecue sauce. ? Citrus fruit juices and citrus fruits, such as oranges, lemons, and limes. ? Tomato-based foods, such as red sauce, chili, salsa, and pizza with red sauce. ? Fried and fatty foods, such as donuts, french fries, potato chips, and high-fat dressings. ? High-fat meats, such as hot dogs and fatty cuts of red and white meats, such as rib eye steak, sausage, ham, and bacon. ? High-fat dairy items, such as whole milk, butter, and cream cheese.  Eat small, frequent meals instead of large meals.  Avoid drinking large amounts of liquid with your meals.  Avoid eating meals during the 2-3 hours before bedtime.  Avoid lying down right after you eat.  Do not exercise right after you eat. Lifestyle   Do not use any products that contain nicotine or tobacco, such as cigarettes, e-cigarettes, and chewing tobacco. If you need help quitting, ask your health care provider.  Try to reduce your stress by using methods such as yoga or meditation. If you need help reducing stress, ask your health care provider.  If you are overweight, reduce your weight to an amount that is healthy for you. Ask your health care provider for guidance about a safe weight loss goal. General instructions  Pay attention to any changes in your symptoms.  Take over-the-counter and prescription medicines only as told by your health care provider. Do not take aspirin, ibuprofen, or other NSAIDs unless your health care provider told you to do so.  Wear loose-fitting clothing. Do not wear anything tight around your waist that causes pressure on your abdomen.  Raise (elevate) the  head of your bed about 6 inches (15 cm).  Avoid bending over if this makes your symptoms worse.  Keep all follow-up visits as told by your health care provider. This is important. Contact a health care provider if:  You have: ? New symptoms. ? Unexplained weight loss. ? Difficulty swallowing or it hurts to swallow. ? Wheezing or a persistent cough. ? A hoarse voice.  Your symptoms do not improve with treatment. Get help right away if you:  Have pain in your arms, neck, jaw, teeth, or back.  Feel sweaty, dizzy, or light-headed.  Have chest pain or shortness of breath.  Vomit and your vomit looks like blood or coffee grounds.  Faint.  Have stool that is bloody or black.  Cannot swallow, drink, or eat. Summary  Gastroesophageal reflux happens when acid from the stomach flows up into the esophagus. GERD is a disease in which the reflux happens often, causes frequent or severe symptoms, or causes problems such as damage to the esophagus.  Treatment for this condition may vary  depending on how severe your symptoms are. Your health care provider may recommend diet and lifestyle changes, medicine, or surgery.  Contact a health care provider if you have new or worsening symptoms.  Take over-the-counter and prescription medicines only as told by your health care provider. Do not take aspirin, ibuprofen, or other NSAIDs unless your health care provider told you to do so.  Keep all follow-up visits as told by your health care provider. This is important. This information is not intended to replace advice given to you by your health care provider. Make sure you discuss any questions you have with your health care provider. Document Released: 04/03/2005 Document Revised: 12/31/2017 Document Reviewed: 12/31/2017 Elsevier Patient Education  [place].   Abdominal Bloating When you have abdominal bloating, your abdomen may feel full, tight, or painful. It may also look bigger  than normal or swollen (distended). Common causes of abdominal bloating include:  Swallowing air.  Constipation.  Problems digesting food.  Eating too much.  Irritable bowel syndrome. This is a condition that affects the large intestine.  Lactose intolerance. This is an inability to digest lactose, a natural sugar in dairy products.  Celiac disease. This is a condition that affects the ability to digest gluten, a protein found in some grains.  Gastroparesis. This is a condition that slows down the movement of food in the stomach and small intestine. It is more common in people with diabetes mellitus.  Gastroesophageal reflux disease (GERD). This is a digestive condition that makes stomach acid flow back into the esophagus.  Urinary retention. This means that the body is holding onto urine, and the bladder cannot be emptied all the way. Follow these instructions at home: Eating and drinking  Avoid eating too much.  Try not to swallow air while talking or eating.  Avoid eating while lying down.  Avoid these foods and drinks: ? Foods that cause gas, such as broccoli, cabbage, cauliflower, and baked beans. ? Carbonated drinks. ? Hard candy. ? Chewing gum. Medicines  Take over-the-counter and prescription medicines only as told by your health care provider.  Take probiotic medicines. These medicines contain live bacteria or yeasts that can help digestion.  Take coated peppermint oil capsules. Activity  Try to exercise regularly. Exercise may help to relieve bloating that is caused by gas and relieve constipation. General instructions  Keep all follow-up visits as told by your health care provider. This is important. Contact a health care provider if:  You have nausea and vomiting.  You have diarrhea.  You have abdominal pain.  You have unusual weight loss or weight gain.  You have severe pain, and medicines do not help. Get help right away if:  You have severe  chest pain.  You have trouble breathing.  You have shortness of breath.  You have trouble urinating.  You have darker urine than normal.  You have blood in your stools or have dark, tarry stools. Summary  Abdominal bloating means that the abdomen is swollen.  Common causes of abdominal bloating are swallowing air, constipation, and problems digesting food.  Avoid eating too much and avoid swallowing air.  Avoid foods that cause gas, carbonated drinks, hard candy, and chewing gum. This information is not intended to replace advice given to you by your health care provider. Make sure you discuss any questions you have with your health care provider. Document Released: 07/26/2016 Document Revised: 10/12/2018 Document Reviewed: 07/26/2016 Elsevier Patient Education  2020 [employer].

## 2019-03-08 NOTE — Telephone Encounter (Signed)
Patient seen in the office today.  She will need OV with [facility] in 3 months.  Please schedule.

## 2019-03-25 ENCOUNTER — Ambulatory Visit (INDEPENDENT_AMBULATORY_CARE_PROVIDER_SITE_OTHER): Payer: Medicare Other | Admitting: Family Medicine

## 2019-03-25 DIAGNOSIS — L299 Pruritus, unspecified: Secondary | ICD-10-CM

## 2019-03-25 MED ORDER — KETOCONAZOLE 2 % EX SHAM: MEDICATED_SHAMPOO | CUTANEOUS | 0 refills | Status: DC

## 2019-03-25 NOTE — Progress Notes (Signed)
Telephone visit  Subjective: CC: rash PCP: Baruch Gouty, FNP [phone] Susan Petty is a 73 y.o. female calls for telephone consult today. Patient provides verbal consent for consult held via phone.  Location of patient: home Location of provider: Working remotely from home Others present for call: none  1. Rash Patient reports that she had onset of itchy skin on her scalp and back that is tingling that started last week.  She has visible rash on arms but none on back or scalp.  She has appointment with Dermatology on Tues.  She has not been using any treatments so far.  She uses VO5 for shampoo.  No new detergents, lotions or soaps.   ROS: Per HPI  Allergies  Allergen Reactions  . Sulfa Antibiotics   . Sulfa Drugs Cross Reactors Rash   Past Medical History:  Diagnosis Date  . Diabetes mellitus   . GERD (gastroesophageal reflux disease)   . Hypertension   . Osteoporosis     Current Outpatient Medications:  .  alendronate (FOSAMAX) 70 MG tablet, TAKE ONE TABLET BY MOUTH ONCE A WEEK WITH A FULL GLASS OF WATER ON AN EMPTY STOMACH, Disp: 4 tablet, Rfl: 2 .  amLODipine (NORVASC) 5 MG tablet, Take 1 tablet (5 mg total) by mouth daily., Disp: 90 tablet, Rfl: 1 .  aspirin EC 81 MG tablet, Take 81 mg by mouth daily., Disp: , Rfl:  .  azelastine (ASTELIN) 0.1 % nasal spray, Place 1 spray into both nostrils 2 (two) times daily., Disp: 30 mL, Rfl: 12 .  baclofen (LIORESAL) 10 MG tablet, Take 1 tablet (10 mg total) by mouth 3 (three) times daily., Disp: 30 each, Rfl: 0 .  benazepril (LOTENSIN) 40 MG tablet, Take 1 tablet (40 mg total) by mouth daily., Disp: 90 tablet, Rfl: 1 .  Cholecalciferol (VITAMIN D-3 PO), Take 2,000 Units by mouth daily., Disp: , Rfl:  .  diclofenac (VOLTAREN) 75 MG EC tablet, Take 1 tablet (75 mg total) by mouth 2 (two) times daily., Disp: 15 tablet, Rfl: 0 .  fluticasone (FLONASE) 50 MCG/ACT nasal spray, Place 2 sprays into both nostrils daily., Disp: 16 g, Rfl: 6 .   glucose blood (ACCU-CHEK AVIVA PLUS) test strip, USE ONE STRIP TO CHECK GLUCOSE 4 TIMES DAILY, Disp: 150 each, Rfl: 1 .  glucose blood (ACCU-CHEK AVIVA PLUS) test strip, 1 each by Other route 6 (six) times daily. USE TO CHECK GLUCOSE 6 TIMES DAILY, Disp: 600 each, Rfl: 3 .  hydrochlorothiazide (HYDRODIURIL) 25 MG tablet, Take 1 tablet (25 mg total) by mouth daily., Disp: 90 tablet, Rfl: 1 .  insulin NPH Human (HUMULIN N) 100 UNIT/ML injection, INJECT 25 UNITS SUBCUTANEOUSLY IN THE MORNING AND 10 UNITS IN THE EVENING, Disp: 30 mL, Rfl: 3 .  insulin regular (NOVOLIN R) 100 units/mL injection, Inject 0.03 mLs (3 Units total) into the skin 3 (three) times daily before meals., Disp: 7.5 mL, Rfl: 1 .  metFORMIN (GLUCOPHAGE) 500 MG tablet, Take 2 tablets (1,000 mg total) by mouth 2 (two) times daily with a meal., Disp: 360 tablet, Rfl: 1 .  metoprolol tartrate (LOPRESSOR) 25 MG tablet, Take 1 tablet (25 mg total) by mouth 2 (two) times daily., Disp: 180 tablet, Rfl: 1 .  polyethylene glycol powder (GLYCOLAX/MIRALAX) 17 GM/SCOOP powder, Take 17 g by mouth 2 (two) times daily as needed., Disp: 3350 g, Rfl: 1 .  pravastatin (PRAVACHOL) 40 MG tablet, Take 1 tablet (40 mg total) by mouth daily., Disp: 90 tablet,  Rfl: 1 .  RABEprazole (ACIPHEX) 20 MG tablet, Take 1 tablet (20 mg total) by mouth 2 (two) times daily before a meal., Disp: 180 tablet, Rfl: 0  Assessment/ Plan: 73 y.o. female   1. Itchy skin Possibly related to underlying fungal infection given summer months and sweating.  Will empirically treat with ketoconazole applied to the affected areas twice weekly for the next few weeks.  I also encouraged her to use an oral antihistamine.  She has follow-up with dermatology.  We discussed limitations of phone visits.  She voiced understanding will follow-up PRN. - ketoconazole (NIZORAL) 2 % shampoo; Apply to scalp and skin.  Leave on for 10 minutes and then rinse off every 4 days for 4 weeks.  Dispense: 120  mL; Refill: 0  2. Itchy scalp - ketoconazole (NIZORAL) 2 % shampoo; Apply to scalp and skin.  Leave on for 10 minutes and then rinse off every 4 days for 4 weeks.  Dispense: 120 mL; Refill: 0   Start time: 9:38am End time: 9:45am  Total time spent on patient care (including telephone call/ virtual visit): 13 minutes  [place], [place] [phone]

## 2019-03-30 DIAGNOSIS — L570 Actinic keratosis: Secondary | ICD-10-CM | POA: Diagnosis not present

## 2019-03-30 DIAGNOSIS — L298 Other pruritus: Secondary | ICD-10-CM | POA: Diagnosis not present

## 2019-03-30 DIAGNOSIS — Z85828 Personal history of other malignant neoplasm of skin: Secondary | ICD-10-CM | POA: Diagnosis not present

## 2019-03-30 DIAGNOSIS — Z08 Encounter for follow-up examination after completed treatment for malignant neoplasm: Secondary | ICD-10-CM | POA: Diagnosis not present

## 2019-03-30 DIAGNOSIS — D225 Melanocytic nevi of trunk: Secondary | ICD-10-CM | POA: Diagnosis not present

## 2019-04-19 ENCOUNTER — Ambulatory Visit (INDEPENDENT_AMBULATORY_CARE_PROVIDER_SITE_OTHER): Payer: Medicare Other | Admitting: *Deleted

## 2019-04-19 DIAGNOSIS — Z0000 Encounter for general adult medical examination without abnormal findings: Secondary | ICD-10-CM

## 2019-04-19 NOTE — Progress Notes (Addendum)
MEDICARE ANNUAL WELLNESS VISIT  04/19/2019  Telephone Visit Disclaimer This Medicare AWV was conducted by telephone due to national recommendations for restrictions regarding the COVID-19 Pandemic (e.g. social distancing).  I verified, using two identifiers, that I am speaking with Susan Petty or their authorized healthcare agent. I discussed the limitations, risks, security, and privacy concerns of performing an evaluation and management service by telephone and the potential availability of an in-person appointment in the future. The patient expressed understanding and agreed to proceed.   Subjective:  Susan Petty is a 73 y.o. female patient of Rakes, Connye Burkitt, FNP who had a Medicare Annual Wellness Visit today via telephone. [name] is Retired and lives alone. she has 2 children. she reports that she is socially active and does interact with friends/family regularly. she is minimally physically active and enjoys watching TV.  Patient Care Team: Baruch Gouty, FNP as PCP - General (Family Medicine) Danie Binder, MD as Consulting Physician (Gastroenterology)  Advanced Directives 04/19/2019 04/14/2018 03/29/2016 09/03/2011 09/03/2011 09/03/2011  Does Patient Have a Medical Advance Directive? No No No Patient does not have advance directive;Patient would not like information - Patient does not have advance directive;Patient would like information  Would patient like information on creating a medical advance directive? No - Patient declined Yes (MAU/Ambulatory/Procedural Areas - Information given) No - patient declined information - - Advance directive packet given  Pre-existing out of facility DNR order (yellow form or pink MOST form) - - - No No No    Hospital Utilization Over the Past 12 Months: # of hospitalizations or ER visits: 0 # of surgeries: 0  Review of Systems    Patient reports that her overall health is unchanged compared to last year.  History obtained from chart  review  Patient Reported Readings (BP, Pulse, CBG, Weight, etc) none  Pain Assessment Pain : No/denies pain     Current Medications & Allergies (verified) Allergies as of 04/19/2019      Reactions   Sulfa Antibiotics    Sulfa Drugs Cross Reactors Rash      Medication List       Accurate as of April 19, 2019 10:04 AM. If you have any questions, ask your nurse or doctor.        alendronate 70 MG tablet Commonly known as: FOSAMAX TAKE ONE TABLET BY MOUTH ONCE A WEEK WITH A FULL GLASS OF WATER ON AN EMPTY STOMACH   amLODipine 5 MG tablet Commonly known as: NORVASC Take 1 tablet (5 mg total) by mouth daily.   aspirin EC 81 MG tablet Take 81 mg by mouth daily.   azelastine 0.1 % nasal spray Commonly known as: ASTELIN Place 1 spray into both nostrils 2 (two) times daily.   baclofen 10 MG tablet Commonly known as: LIORESAL Take 1 tablet (10 mg total) by mouth 3 (three) times daily.   benazepril 40 MG tablet Commonly known as: LOTENSIN Take 1 tablet (40 mg total) by mouth daily.   diclofenac 75 MG EC tablet Commonly known as: VOLTAREN Take 1 tablet (75 mg total) by mouth 2 (two) times daily.   fluticasone 50 MCG/ACT nasal spray Commonly known as: FLONASE Place 2 sprays into both nostrils daily.   glucose blood test strip Commonly known as: Accu-Chek Aviva Plus USE ONE STRIP TO CHECK GLUCOSE 4 TIMES DAILY   glucose blood test strip Commonly known as: Accu-Chek Aviva Plus 1 each by Other route 6 (six) times daily. USE TO  CHECK GLUCOSE 6 TIMES DAILY   hydrochlorothiazide 25 MG tablet Commonly known as: HYDRODIURIL Take 1 tablet (25 mg total) by mouth daily.   insulin NPH Human 100 UNIT/ML injection Commonly known as: HumuLIN N INJECT 25 UNITS SUBCUTANEOUSLY IN THE MORNING AND 10 UNITS IN THE EVENING   insulin regular 100 units/mL injection Commonly known as: NOVOLIN R Inject 0.03 mLs (3 Units total) into the skin 3 (three) times daily before meals.    ketoconazole 2 % shampoo Commonly known as: NIZORAL Apply to scalp and skin.  Leave on for 10 minutes and then rinse off every 4 days for 4 weeks.   metFORMIN 500 MG tablet Commonly known as: GLUCOPHAGE Take 2 tablets (1,000 mg total) by mouth 2 (two) times daily with a meal.   metoprolol tartrate 25 MG tablet Commonly known as: LOPRESSOR Take 1 tablet (25 mg total) by mouth 2 (two) times daily.   polyethylene glycol powder 17 GM/SCOOP powder Commonly known as: GLYCOLAX/MIRALAX Take 17 g by mouth 2 (two) times daily as needed.   pravastatin 40 MG tablet Commonly known as: PRAVACHOL Take 1 tablet (40 mg total) by mouth daily.   RABEprazole 20 MG tablet Commonly known as: ACIPHEX Take 1 tablet (20 mg total) by mouth 2 (two) times daily before a meal.   VITAMIN D-3 PO Take 2,000 Units by mouth daily.       History (reviewed): Past Medical History:  Diagnosis Date  . Diabetes mellitus   . GERD (gastroesophageal reflux disease)   . Hyperlipidemia   . Hypertension   . Osteoporosis    Past Surgical History:  Procedure Laterality Date  . COLONOSCOPY N/A 03/29/2016   Dr. Oneida Alar: Redundant left colon, nonbleeding internal hemorrhoids.  Consider colonoscopy in 10 to 15 years if benefits outweigh the risk   . ESOPHAGOGASTRODUODENOSCOPY N/A 03/29/2016   Dr. Oneida Alar: Small hiatal hernia, small sessile polyps found in the gastric fundus, benign fundic gland polyps, mild inflammation and erythema in the gastric antrum.  Duodenum was normal.  Biopsy negative for celiac.  Schatzki ring, patent.  Marland Kitchen TOTAL HIP ARTHROPLASTY  09/03/2011   Procedure: TOTAL HIP ARTHROPLASTY;  Surgeon: Mauri Pole, MD;  Location: [facility];  Service: Orthopedics;  Laterality: Left;   Family History  Problem Relation Age of Onset  . Hypertension Mother   . Hypertension Father   . Diabetes Father   . Hypertension Sister   . Cancer Sister   . Diabetes Brother   . Diabetes Brother   . Colon cancer Neg Hx     Social History   Socioeconomic History  . Marital status: Widowed    Spouse name: Not on file  . Number of children: 2  . Years of education: 60  . Highest education level: High school graduate  Occupational History  . Occupation: retired  Scientific laboratory technician  . Financial resource strain: Not hard at all  . Food insecurity    Worry: Never true    Inability: Never true  . Transportation needs    Medical: No    Non-medical: No  Tobacco Use  . Smoking status: Never Smoker  . Smokeless tobacco: Never Used  Substance and Sexual Activity  . Alcohol use: Yes    Comment: occasional wine  . Drug use: No  . Sexual activity: Not Currently    Birth control/protection: None  Lifestyle  . Physical activity    Days per week: 2 days    Minutes per session: 60 min  .  Stress: Only a little  Relationships  . Social connections    Talks on phone: More than three times a week    Gets together: More than three times a week    Attends religious service: More than 4 times per year    Active member of club or organization: Yes    Attends meetings of clubs or organizations: More than 4 times per year    Relationship status: Widowed  Other Topics Concern  . Not on file  Social History Narrative  . Not on file    Activities of Daily Living In your present state of health, do you have any difficulty performing the following activities: 04/19/2019  Petty? N  Vision? N  Comment gets yearly eye exams-wears glasses to read  Difficulty concentrating or making decisions? N  Walking or climbing stairs? N  Dressing or bathing? N  Doing errands, shopping? N  Preparing Food and eating ? N  Using the Toilet? N  In the past six months, have you accidently leaked urine? N  Do you have problems with loss of bowel control? N  Managing your Medications? N  Managing your Finances? N  Housekeeping or managing your Housekeeping? N  Some recent data might be hidden    Patient Education/ Literacy How  often do you need to have someone help you when you read instructions, pamphlets, or other written materials from your doctor or pharmacy?: 1 - Never What is the last grade level you completed in school?: 12th grade  Exercise Current Exercise Habits: Home exercise routine, Type of exercise: walking, Time (Minutes): 60, Frequency (Times/Week): 2, Weekly Exercise (Minutes/Week): 120, Intensity: Mild, Exercise limited by: None identified  Diet Patient reports consuming 2 meals a day and 2 snack(s) a day Patient reports that her primary diet is: Regular Patient reports that she does have regular access to food.   Depression Screen PHQ 2/9 Scores 04/19/2019 02/09/2019 12/11/2018 09/11/2018 08/26/2018 07/14/2018 06/12/2018  PHQ - 2 Score 0 0 0 0 0 0 0  PHQ- 9 Score - - 0 - - - -     Fall Risk Fall Risk  04/19/2019 02/09/2019 12/11/2018 09/11/2018 08/26/2018  Falls in the past year? 0 0 0 0 0  Number falls in past yr: 0 - - - -  Injury with Fall? 0 - - - -  Follow up Falls prevention discussed - - - -  Comment Get rid of all throw rugs, adequate lighting in the walkways and grab bars in the bathroom - - - -     Objective:  Susan Petty seemed alert and oriented and she participated appropriately during our telephone visit.  Blood Pressure Weight BMI  BP Readings from Last 3 Encounters:  03/08/19 (!) 170/68  02/09/19 (!) 147/68  12/11/18 135/63   Wt Readings from Last 3 Encounters:  03/08/19 138 lb 12.8 oz (63 kg)  02/09/19 138 lb 3.2 oz (62.7 kg)  12/11/18 136 lb (61.7 kg)   BMI Readings from Last 1 Encounters:  03/08/19 23.10 kg/m    *Unable to obtain current vital signs, weight, and BMI due to telephone visit type  Petty/Vision  . Rosabelle did not seem to have difficulty with Petty/understanding during the telephone conversation . Reports that she has not had a formal eye exam by an eye care professional within the past year . Reports that she has not had a formal Petty evaluation  within the past year *Unable to fully assess Petty and vision during  telephone visit type  Cognitive Function: 6CIT Screen 04/19/2019  What Year? 0 points  What month? 0 points  What time? 0 points  Count back from 20 0 points  Months in reverse 0 points  Repeat phrase 0 points  Total Score 0   (Normal:0-7, Significant for Dysfunction: >8)  Normal Cognitive Function Screening: Yes   Immunization & Health Maintenance Record Immunization History  Administered Date(s) Administered  . Influenza, High Dose Seasonal PF 05/05/2013, 04/08/2017, 04/14/2018  . Influenza,inj,Quad PF,6+ Mos 04/13/2014  . Influenza-Unspecified 04/20/2012  . Pneumococcal Conjugate-13 03/14/2015  . Pneumococcal Polysaccharide-23 04/14/2018  . Td 10/18/1996    Health Maintenance  Topic Date Due  . OPHTHALMOLOGY EXAM  08/19/2015  . INFLUENZA VACCINE  11/26/2019 (Originally 02/06/2019)  . HEMOGLOBIN A1C  06/12/2019  . FOOT EXAM  12/11/2019  . TETANUS/TDAP  05/16/2022  . COLONOSCOPY  03/29/2026  . DEXA SCAN  Completed  . Hepatitis C Screening  Completed  . PNA vac Low Risk Adult  Completed  . PAP SMEAR-Modifier  Discontinued       Assessment  This is a routine wellness examination for NEARIAH LUYSTER.  Health Maintenance: Due or Overdue Health Maintenance Due  Topic Date Due  . OPHTHALMOLOGY EXAM  08/19/2015    Susan Petty does not need a referral for Community Assistance: Care Management:   no Social Work:    no Prescription Assistance:  no Nutrition/Diabetes Education:  no   Plan:  Personalized Goals Goals Addressed            This Visit's Progress   . DIET - INCREASE WATER INTAKE       Try to drink 6-8 glasses of water daily.      Personalized Health Maintenance & Screening Recommendations  Influenza vaccine Td vaccine Shingles vaccine  Lung Cancer Screening Recommended: no (Low Dose CT Chest recommended if Age 60-80 years, 30 pack-year currently smoking OR have quit  w/in past 15 years) Hepatitis C Screening recommended: no HIV Screening recommended: no  Advanced Directives: Written information was not prepared per patient's request.  Referrals & Orders No orders of the defined types were placed in this encounter.   Follow-up Plan . Follow-up with Baruch Gouty, FNP as planned . Consider Flu, TDAP and Shingles vaccines at your next visit with your PCP   I have personally reviewed and noted the following in the patient's chart:   . Medical and social history . Use of alcohol, tobacco or illicit drugs  . Current medications and supplements . Functional ability and status . Nutritional status . Physical activity . Advanced directives . List of other physicians . Hospitalizations, surgeries, and ER visits in previous 12 months . Vitals . Screenings to include cognitive, depression, and falls . Referrals and appointments  In addition, I have reviewed and discussed with Susan Petty certain preventive protocols, quality metrics, and best practice recommendations. A written personalized care plan for preventive services as well as general preventive health recommendations is available and can be mailed to the patient at her request.      Southern, Donny Pique, LPN  [id number]    I have reviewed and agree with the above AWV documentation.   Evelina Dun, FNP

## 2019-04-19 NOTE — Patient Instructions (Signed)
Preventive Care 75 Years and Older, Female Preventive care refers to lifestyle choices and visits with your health care provider that can promote health and wellness. This includes:  A yearly physical exam. This is also called an annual well check.  Regular dental and eye exams.  Immunizations.  Screening for certain conditions.  Healthy lifestyle choices, such as diet and exercise. What can I expect for my preventive care visit? Physical exam Your health care provider will check:  Height and weight. These may be used to calculate body mass index (BMI), which is a measurement that tells if you are at a healthy weight.  Heart rate and blood pressure.  Your skin for abnormal spots. Counseling Your health care provider may ask you questions about:  Alcohol, tobacco, and drug use.  Emotional well-being.  Home and relationship well-being.  Sexual activity.  Eating habits.  History of falls.  Memory and ability to understand (cognition).  Work and work Statistician.  Pregnancy and menstrual history. What immunizations do I need?  Influenza (flu) vaccine  This is recommended every year. Tetanus, diphtheria, and pertussis (Tdap) vaccine  You may need a Td booster every 10 years. Varicella (chickenpox) vaccine  You may need this vaccine if you have not already been vaccinated. Zoster (shingles) vaccine  You may need this after age 75. Pneumococcal conjugate (PCV13) vaccine  One dose is recommended after age 75. Pneumococcal polysaccharide (PPSV23) vaccine  One dose is recommended after age 75. Measles, mumps, and rubella (MMR) vaccine  You may need at least one dose of MMR if you were born in 1957 or later. You may also need a second dose. Meningococcal conjugate (MenACWY) vaccine  You may need this if you have certain conditions. Hepatitis A vaccine  You may need this if you have certain conditions or if you travel or work in places where you may be exposed  to hepatitis A. Hepatitis B vaccine  You may need this if you have certain conditions or if you travel or work in places where you may be exposed to hepatitis B. Haemophilus influenzae type b (Hib) vaccine  You may need this if you have certain conditions. You may receive vaccines as individual doses or as more than one vaccine together in one shot (combination vaccines). Talk with your health care provider about the risks and benefits of combination vaccines. What tests do I need? Blood tests  Lipid and cholesterol levels. These may be checked every 5 years, or more frequently depending on your overall health.  Hepatitis C test.  Hepatitis B test. Screening  Lung cancer screening. You may have this screening every year starting at age 75 if you have a 30-pack-year history of smoking and currently smoke or have quit within the past 15 years.  Colorectal cancer screening. All adults should have this screening starting at age 75 and continuing until age 15. Your health care provider may recommend screening at age 75 if you are at increased risk. You will have tests every 1-10 years, depending on your results and the type of screening test.  Diabetes screening. This is done by checking your blood sugar (glucose) after you have not eaten for a while (fasting). You may have this done every 1-3 years.  Mammogram. This may be done every 1-2 years. Talk with your health care provider about how often you should have regular mammograms.  BRCA-related cancer screening. This may be done if you have a family history of breast, ovarian, tubal, or peritoneal cancers.  Other tests  Sexually transmitted disease (STD) testing.  Bone density scan. This is done to screen for osteoporosis. You may have this done starting at age 75. Follow these instructions at home: Eating and drinking  Eat a diet that includes fresh fruits and vegetables, whole grains, lean protein, and low-fat dairy products. Limit  your intake of foods with high amounts of sugar, saturated fats, and salt.  Take vitamin and mineral supplements as recommended by your health care provider.  Do not drink alcohol if your health care provider tells you not to drink.  If you drink alcohol: ? Limit how much you have to 0-1 drink a day. ? Be aware of how much alcohol is in your drink. In the [place] one drink equals one 12 oz bottle of beer (355 mL), one 5 oz glass of wine (148 mL), or one 1 oz glass of hard liquor (44 mL). Lifestyle  Take daily care of your teeth and gums.  Stay active. Exercise for at least 30 minutes on 5 or more days each week.  Do not use any products that contain nicotine or tobacco, such as cigarettes, e-cigarettes, and chewing tobacco. If you need help quitting, ask your health care provider.  If you are sexually active, practice safe sex. Use a condom or other form of protection in order to prevent STIs (sexually transmitted infections).  Talk with your health care provider about taking a low-dose aspirin or statin. What's next?  Go to your health care provider once a year for a well check visit.  Ask your health care provider how often you should have your eyes and teeth checked.  Stay up to date on all vaccines. This information is not intended to replace advice given to you by your health care provider. Make sure you discuss any questions you have with your health care provider. Document Released: 07/21/2015 Document Revised: 06/18/2018 Document Reviewed: 06/18/2018 Elsevier Patient Education  2020 [employer].

## 2019-04-20 ENCOUNTER — Other Ambulatory Visit: Payer: Self-pay

## 2019-04-21 ENCOUNTER — Ambulatory Visit (INDEPENDENT_AMBULATORY_CARE_PROVIDER_SITE_OTHER): Payer: Medicare Other | Admitting: *Deleted

## 2019-04-21 DIAGNOSIS — Z23 Encounter for immunization: Secondary | ICD-10-CM

## 2019-04-22 ENCOUNTER — Other Ambulatory Visit: Payer: Self-pay | Admitting: *Deleted

## 2019-04-22 ENCOUNTER — Encounter: Payer: Self-pay | Admitting: Family Medicine

## 2019-04-22 ENCOUNTER — Telehealth: Payer: Self-pay | Admitting: *Deleted

## 2019-04-22 DIAGNOSIS — E119 Type 2 diabetes mellitus without complications: Secondary | ICD-10-CM

## 2019-04-22 MED ORDER — ACCU-CHEK AVIVA PLUS VI STRP: ORAL_STRIP | 3 refills | Status: DC

## 2019-04-22 NOTE — Telephone Encounter (Signed)
Aware, test strips has been refilled.

## 2019-04-26 ENCOUNTER — Encounter: Payer: Self-pay | Admitting: Gastroenterology

## 2019-04-29 ENCOUNTER — Ambulatory Visit (INDEPENDENT_AMBULATORY_CARE_PROVIDER_SITE_OTHER): Payer: Medicare Other | Admitting: Family Medicine

## 2019-04-29 ENCOUNTER — Encounter: Payer: Self-pay | Admitting: Family Medicine

## 2019-04-29 DIAGNOSIS — B001 Herpesviral vesicular dermatitis: Secondary | ICD-10-CM

## 2019-04-29 MED ORDER — VALACYCLOVIR HCL 1 G PO TABS: ORAL_TABLET | ORAL | 0 refills | Status: DC

## 2019-04-29 NOTE — Progress Notes (Signed)
    Subjective:    Patient ID: Susan Petty, female    DOB: Dec 04, 1945, 73 y.o.   MRN: [id number]   HPI: Susan Petty is a 73 y.o. female presenting for Patient presents today for cold sore on mouth. Concerned that it will make her glucose go up since she is a diabetic. Denies fever. Tried Abreva without relief. Onset 3 weeks ago, getting more sore during that time. Just won't clear.    Depression screen [facility] 2/9 04/19/2019 02/09/2019 12/11/2018 09/11/2018 08/26/2018  Decreased Interest 0 0 0 0 0  Down, Depressed, Hopeless 0 0 0 0 0  PHQ - 2 Score 0 0 0 0 0  Altered sleeping - - 0 - -  Tired, decreased energy - - 0 - -  Change in appetite - - 0 - -  Feeling bad or failure about yourself  - - 0 - -  Trouble concentrating - - 0 - -  Moving slowly or fidgety/restless - - 0 - -  Suicidal thoughts - - 0 - -  PHQ-9 Score - - 0 - -  Some recent data might be hidden     Relevant past medical, surgical, family and social history reviewed and updated as indicated.  Interim medical history since our last visit reviewed. Allergies and medications reviewed and updated.  ROS:  Review of Systems   Social History   Tobacco Use  Smoking Status Never Smoker  Smokeless Tobacco Never Used       Objective:     Wt Readings from Last 3 Encounters:  03/08/19 138 lb 12.8 oz (63 kg)  02/09/19 138 lb 3.2 oz (62.7 kg)  12/11/18 136 lb (61.7 kg)     Exam deferred. Pt. Harboring due to COVID 19. Phone visit performed.   Assessment & Plan:   1. Herpes labialis     Meds ordered this encounter  Medications  . valACYclovir (VALTREX) 1000 MG tablet    Sig: 2g twice daily for 2 doses, start as soon as symptoms begin    Dispense:  8 tablet    Refill:  0    No orders of the defined types were placed in this encounter.     Diagnoses and all orders for this visit:  Herpes labialis -     valACYclovir (VALTREX) 1000 MG tablet; 2g twice daily for 2 doses, start as soon as symptoms begin     Virtual Visit via telephone Note  I discussed the limitations, risks, security and privacy concerns of performing an evaluation and management service by telephone and the availability of in person appointments. The patient was identified with two identifiers. Pt.expressed understanding and agreed to proceed. Pt. Is at home. Dr. Livia Snellen is in his office.  Follow Up Instructions:   I discussed the assessment and treatment plan with the patient. The patient was provided an opportunity to ask questions and all were answered. The patient agreed with the plan and demonstrated an understanding of the instructions.   The patient was advised to call back or seek an in-person evaluation if the symptoms worsen or if the condition fails to improve as anticipated.   Total minutes including chart review and phone contact time: 8   Follow up plan: Return if symptoms worsen or fail to improve.  Susan Fraise, MD [place]

## 2019-05-01 ENCOUNTER — Encounter (INDEPENDENT_AMBULATORY_CARE_PROVIDER_SITE_OTHER): Payer: Self-pay

## 2019-05-11 ENCOUNTER — Encounter: Payer: Self-pay | Admitting: Nurse Practitioner

## 2019-05-11 ENCOUNTER — Ambulatory Visit (INDEPENDENT_AMBULATORY_CARE_PROVIDER_SITE_OTHER): Payer: Medicare Other | Admitting: Nurse Practitioner

## 2019-05-11 DIAGNOSIS — J0100 Acute maxillary sinusitis, unspecified: Secondary | ICD-10-CM

## 2019-05-11 MED ORDER — AMOXICILLIN-POT CLAVULANATE 875-125 MG PO TABS: 1.0000 | ORAL_TABLET | Freq: Two times a day (BID) | ORAL | 0 refills | Status: DC

## 2019-05-11 NOTE — Progress Notes (Signed)
Virtual Visit via telephone Note Due to COVID-19 pandemic this visit was conducted virtually. This visit type was conducted due to national recommendations for restrictions regarding the COVID-19 Pandemic (e.g. social distancing, sheltering in place) in an effort to limit this patient's exposure and mitigate transmission in our community. All issues noted in this document were discussed and addressed.  A physical exam was not performed with this format.  I connected with Susan Petty on 05/11/19 at 5:20 by telephone and verified that I am speaking with the correct person using two identifiers. Susan Petty is currently located at HOME and NON ONE is currently with HER during visit. The provider, Mary-Margaret Hassell Done, FNP is located in their office at time of visit.  I discussed the limitations, risks, security and privacy concerns of performing an evaluation and management service by telephone and the availability of in person appointments. I also discussed with the patient that there may be a patient responsible charge related to this service. The patient expressed understanding and agreed to proceed.   History and Present Illness:  .mmmacute  Patient calls in c/o had ache, and facial pressure. Has been cough form post nasal drip at night. Has been going on fr 2-3 weeks and has gotten worse this week. She has been using nose spray and vaporizer.   Review of Systems  Constitutional: Negative for chills and fever.  HENT: Positive for congestion, ear pain and sinus pain.   Respiratory: Positive for cough (only at night). Negative for shortness of breath.   Cardiovascular: Negative.   Neurological: Positive for headaches. Negative for dizziness.  Psychiatric/Behavioral: Negative.   All other systems reviewed and are negative.    Observations/Objective: Alert and oriented- answers all questions appropriately Mild distress no cough during telephone visit  No sob noted      Assessment and Plan: Susan Petty in today with chief complaint of Sinusitis   1. Acute non-recurrent maxillary sinusitis  1. Take meds as prescribed 2. Use a cool mist humidifier especially during the winter months and when heat has been humid. 3. Use saline nose sprays frequently 4. Saline irrigations of the nose can be very helpful if done frequently.  * 4X daily for 1 week*  * Use of a nettie pot can be helpful with this. Follow directions with this* 5. Drink plenty of fluids 6. Keep thermostat turn down low 7.For any cough or congestion  Use plain Mucinex- regular strength or max strength is fine   * Children- consult with Pharmacist for dosing 8. For fever or aces or pains- take tylenol or ibuprofen appropriate for age and weight.  * for fevers greater than 101 orally you may alternate ibuprofen and tylenol every  3 hours.   Meds ordered this encounter  Medications  . amoxicillin-clavulanate (AUGMENTIN) 875-125 MG tablet    Sig: Take 1 tablet by mouth 2 (two) times daily.    Dispense:  14 tablet    Refill:  0    Order Specific Question:   Supervising Provider    Answer:   Caryl Pina A [id number]    Follow up prn   I discussed the assessment and treatment plan with the patient. The patient was provided an opportunity to ask questions and all were answered. The patient agreed with the plan and demonstrated an understanding of the instructions.   The patient was advised to call back or seek an in-person evaluation if the symptoms worsen or if the condition fails to improve  as anticipated.  The above assessment and management plan was discussed with the patient. The patient verbalized understanding of and has agreed to the management plan. Patient is aware to call the clinic if symptoms persist or worsen. Patient is aware when to return to the clinic for a follow-up visit. Patient educated on when it is appropriate to go to the emergency department.   Time call  ended:  5:30  I provided 10 minutes of non-face-to-face time during this encounter.    Mary-Margaret Hassell Done, FNP

## 2019-05-18 ENCOUNTER — Other Ambulatory Visit: Payer: Self-pay

## 2019-05-18 DIAGNOSIS — Z1231 Encounter for screening mammogram for malignant neoplasm of breast: Secondary | ICD-10-CM | POA: Diagnosis not present

## 2019-05-19 ENCOUNTER — Ambulatory Visit (INDEPENDENT_AMBULATORY_CARE_PROVIDER_SITE_OTHER): Payer: Medicare Other | Admitting: Family Medicine

## 2019-05-19 ENCOUNTER — Encounter: Payer: Self-pay | Admitting: Family Medicine

## 2019-05-19 VITALS — BP 130/59 | HR 59 | Temp 97.8°F | Resp 20 | Ht 65.0 in | Wt 141.0 lb

## 2019-05-19 DIAGNOSIS — E119 Type 2 diabetes mellitus without complications: Secondary | ICD-10-CM

## 2019-05-19 DIAGNOSIS — F411 Generalized anxiety disorder: Secondary | ICD-10-CM

## 2019-05-19 DIAGNOSIS — E1159 Type 2 diabetes mellitus with other circulatory complications: Secondary | ICD-10-CM | POA: Diagnosis not present

## 2019-05-19 DIAGNOSIS — E1169 Type 2 diabetes mellitus with other specified complication: Secondary | ICD-10-CM

## 2019-05-19 DIAGNOSIS — M810 Age-related osteoporosis without current pathological fracture: Secondary | ICD-10-CM

## 2019-05-19 DIAGNOSIS — I10 Essential (primary) hypertension: Secondary | ICD-10-CM | POA: Diagnosis not present

## 2019-05-19 DIAGNOSIS — I152 Hypertension secondary to endocrine disorders: Secondary | ICD-10-CM

## 2019-05-19 DIAGNOSIS — Z794 Long term (current) use of insulin: Secondary | ICD-10-CM

## 2019-05-19 DIAGNOSIS — E785 Hyperlipidemia, unspecified: Secondary | ICD-10-CM

## 2019-05-19 LAB — LIPID PANEL
Chol/HDL Ratio: 1.9 ratio (ref 0.0–4.4)
Cholesterol, Total: 143 mg/dL (ref 100–199)
HDL: 74 mg/dL (ref >39)
LDL Chol Calc (NIH): 56 mg/dL (ref 0–99)
Triglycerides: 66 mg/dL (ref 0–149)
VLDL Cholesterol Cal: 13 mg/dL (ref 5–40)

## 2019-05-19 LAB — BAYER DCA HB A1C WAIVED: HB A1C (BAYER DCA - WAIVED): 7.9 % — ABNORMAL HIGH (ref <7.0)

## 2019-05-19 LAB — CBC WITH DIFFERENTIAL/PLATELET
Basophils Absolute: 0.1 x10E3/uL (ref 0.0–0.2)
Basos: 2 %
EOS (ABSOLUTE): 0.2 x10E3/uL (ref 0.0–0.4)
Eos: 4 %
Hematocrit: 35.2 % (ref 34.0–46.6)
Hemoglobin: 11.5 g/dL (ref 11.1–15.9)
Immature Grans (Abs): 0 x10E3/uL (ref 0.0–0.1)
Immature Granulocytes: 0 %
Lymphocytes Absolute: 2.3 x10E3/uL (ref 0.7–3.1)
Lymphs: 45 %
MCH: 31.3 pg (ref 26.6–33.0)
MCHC: 32.7 g/dL (ref 31.5–35.7)
MCV: 96 fL (ref 79–97)
Monocytes Absolute: 0.4 x10E3/uL (ref 0.1–0.9)
Monocytes: 8 %
Neutrophils Absolute: 2.1 x10E3/uL (ref 1.4–7.0)
Neutrophils: 41 %
Platelets: 171 x10E3/uL (ref 150–450)
RBC: 3.68 x10E6/uL — ABNORMAL LOW (ref 3.77–5.28)
RDW: 13.3 % (ref 11.7–15.4)
WBC: 5.2 x10E3/uL (ref 3.4–10.8)

## 2019-05-19 LAB — CMP14+EGFR
ALT: 14 IU/L (ref 0–32)
AST: 21 IU/L (ref 0–40)
Albumin/Globulin Ratio: 1.9 (ref 1.2–2.2)
Albumin: 4.2 g/dL (ref 3.7–4.7)
Alkaline Phosphatase: 75 IU/L (ref 39–117)
BUN/Creatinine Ratio: 15 (ref 12–28)
BUN: 13 mg/dL (ref 8–27)
Bilirubin Total: 0.3 mg/dL (ref 0.0–1.2)
CO2: 29 mmol/L (ref 20–29)
Calcium: 9.5 mg/dL (ref 8.7–10.3)
Chloride: 101 mmol/L (ref 96–106)
Creatinine, Ser: 0.86 mg/dL (ref 0.57–1.00)
GFR calc Af Amer: 76 mL/min/{1.73_m2} (ref >59)
GFR calc non Af Amer: 66 mL/min/{1.73_m2} (ref >59)
Globulin, Total: 2.2 g/dL (ref 1.5–4.5)
Glucose: 141 mg/dL — ABNORMAL HIGH (ref 65–99)
Potassium: 4.5 mmol/L (ref 3.5–5.2)
Sodium: 141 mmol/L (ref 134–144)
Total Protein: 6.4 g/dL (ref 6.0–8.5)

## 2019-05-19 MED ORDER — ALENDRONATE SODIUM 70 MG PO TABS: ORAL_TABLET | ORAL | 2 refills | Status: DC

## 2019-05-19 MED ORDER — METFORMIN HCL 500 MG PO TABS: 500.0000 mg | ORAL_TABLET | Freq: Two times a day (BID) | ORAL | 1 refills | Status: DC

## 2019-05-19 MED ORDER — INSULIN NPH (HUMAN) (ISOPHANE) 100 UNIT/ML ~~LOC~~ SUSP: SUBCUTANEOUS | 3 refills | Status: DC

## 2019-05-19 NOTE — Patient Instructions (Addendum)
Melatonin up to 10 mg per night. Start at 3 mg and increase if needed.    Continue to monitor your blood sugars as we discussed and record them. Bring the log to your next appointment.  Take your medications as directed.    Goal Blood glucose:    Fasting (before meals) = 80 to 130   Within 2 hours of eating = less than 180   Understanding your Hemoglobin A1c:     Diabetes Mellitus and Nutrition    I think that you would greatly benefit from seeing a nutritionist. If this is something you are interested in, please call Dr Jenne Campus at [phone] to schedule an appointment.   When you have diabetes (diabetes mellitus), it is very important to have healthy eating habits because your blood sugar (glucose) levels are greatly affected by what you eat and drink. Eating healthy foods in the appropriate amounts, at about the same times every day, can help you:  Control your blood glucose.  Lower your risk of heart disease.  Improve your blood pressure.  Reach or maintain a healthy weight.  Every person with diabetes is different, and each person has different needs for a meal plan. Your health care provider may recommend that you work with a diet and nutrition specialist (dietitian) to make a meal plan that is best for you. Your meal plan may vary depending on factors such as:  The calories you need.  The medicines you take.  Your weight.  Your blood glucose, blood pressure, and cholesterol levels.  Your activity level.  Other health conditions you have, such as heart or kidney disease.  How do carbohydrates affect me? Carbohydrates affect your blood glucose level more than any other type of food. Eating carbohydrates naturally increases the amount of glucose in your blood. Carbohydrate counting is a method for keeping track of how many carbohydrates you eat. Counting carbohydrates is important to keep your blood glucose at a healthy level, especially if you use insulin or take  certain oral diabetes medicines. It is important to know how many carbohydrates you can safely have in each meal. This is different for every person. Your dietitian can help you calculate how many carbohydrates you should have at each meal and for snack. Foods that contain carbohydrates include:  Bread, cereal, rice, pasta, and crackers.  Potatoes and corn.  Peas, beans, and lentils.  Milk and yogurt.  Fruit and juice.  Desserts, such as cakes, cookies, ice cream, and candy.  How does alcohol affect me? Alcohol can cause a sudden decrease in blood glucose (hypoglycemia), especially if you use insulin or take certain oral diabetes medicines. Hypoglycemia can be a life-threatening condition. Symptoms of hypoglycemia (sleepiness, dizziness, and confusion) are similar to symptoms of having too much alcohol. If your health care provider says that alcohol is safe for you, follow these guidelines:  Limit alcohol intake to no more than 1 drink per day for nonpregnant women and 2 drinks per day for men. One drink equals 12 oz of beer, 5 oz of wine, or 1 oz of hard liquor.  Do not drink on an empty stomach.  Keep yourself hydrated with water, diet soda, or unsweetened iced tea.  Keep in mind that regular soda, juice, and other mixers may contain a lot of sugar and must be counted as carbohydrates.  What are tips for following this plan?  Reading food labels  Start by checking the serving size on the label. The amount of calories, carbohydrates,  fats, and other nutrients listed on the label are based on one serving of the food. Many foods contain more than one serving per package.  Check the total grams (g) of carbohydrates in one serving. You can calculate the number of servings of carbohydrates in one serving by dividing the total carbohydrates by 15. For example, if a food has 30 g of total carbohydrates, it would be equal to 2 servings of carbohydrates.  Check the number of grams (g) of  saturated and trans fats in one serving. Choose foods that have low or no amount of these fats.  Check the number of milligrams (mg) of sodium in one serving. Most people should limit total sodium intake to less than 2,300 mg per day.  Always check the nutrition information of foods labeled as "low-fat" or "nonfat". These foods may be higher in added sugar or refined carbohydrates and should be avoided.  Talk to your dietitian to identify your daily goals for nutrients listed on the label.  Shopping  Avoid buying canned, premade, or processed foods. These foods tend to be high in fat, sodium, and added sugar.  Shop around the outside edge of the grocery store. This includes fresh fruits and vegetables, bulk grains, fresh meats, and fresh dairy.  Cooking  Use low-heat cooking methods, such as baking, instead of high-heat cooking methods like deep frying.  Cook using healthy oils, such as olive, canola, or sunflower oil.  Avoid cooking with butter, cream, or high-fat meats.  Meal planning  Eat meals and snacks regularly, preferably at the same times every day. Avoid going long periods of time without eating.  Eat foods high in fiber, such as fresh fruits, vegetables, beans, and whole grains. Talk to your dietitian about how many servings of carbohydrates you can eat at each meal.  Eat 4-6 ounces of lean protein each day, such as lean meat, chicken, fish, eggs, or tofu. 1 ounce is equal to 1 ounce of meat, chicken, or fish, 1 egg, or 1/4 cup of tofu.  Eat some foods each day that contain healthy fats, such as avocado, nuts, seeds, and fish.  Lifestyle   Check your blood glucose regularly.  Exercise at least 30 minutes 5 or more days each week, or as told by your health care provider.  Take medicines as told by your health care provider.  Do not use any products that contain nicotine or tobacco, such as cigarettes and e-cigarettes. If you need help quitting, ask your health care  provider.  Work with a Social worker or diabetes educator to identify strategies to manage stress and any emotional and social challenges.   What are some questions to ask my health care provider?  Do I need to meet with a diabetes educator?  Do I need to meet with a dietitian?  What number can I call if I have questions?  When are the best times to check my blood glucose?   Where to find more information:  American Diabetes Association: diabetes.org/food-and-fitness/food  Academy of Nutrition and Dietetics: PokerClues.dk  [employer] of Diabetes and Digestive and Kidney Diseases (NIH): ContactWire.be   Summary  A healthy meal plan will help you control your blood glucose and maintain a healthy lifestyle.  Working with a diet and nutrition specialist (dietitian) can help you make a meal plan that is best for you.  Keep in mind that carbohydrates and alcohol have immediate effects on your blood glucose levels. It is important to count carbohydrates and to use  alcohol carefully. This information is not intended to replace advice given to you by your health care provider. Make sure you discuss any questions you have with your health care provider. Document Released: 03/21/2005 Document Revised: 07/29/2016 Document Reviewed: 07/29/2016 Elsevier Interactive Patient Education  2018 [employer].  Avoid fried, spicy, fatty, greasy, and acidic foods. Avoid caffeine, nicotine, and alcohol. Do not eat 2-3 hours before bedtime and stay upright for at least 1-2 hours after eating. Eat small frequent meals. Avoid NSAID's like motrin and aleve. Medications as prescribed. Report any new or worsening symptoms.

## 2019-05-19 NOTE — Progress Notes (Signed)
Subjective:  Patient ID: Susan Petty, female    DOB: 05-19-1946, 73 y.o.   MRN: [id number]  Patient Care Team: Baruch Gouty, FNP as PCP - General (Family Medicine) Danie Binder, MD as Consulting Physician (Gastroenterology)   Chief Complaint:  Medical Management of Chronic Issues (3 mo ), Diabetes, Hypertension, and Hyperlipidemia   HPI: Susan Petty is a 73 y.o. female presenting on 05/19/2019 for Medical Management of Chronic Issues (3 mo ), Diabetes, Hypertension, and Hyperlipidemia  1. Type 2 diabetes mellitus with insulin therapy (HCC) Susan Petty reports she has not been taking her medications as prescribed as she feels the metformin was causing her blood sugar to drop. She did not bring her blood sugar logs with her today. She states her blood sugar will be around 70 sometimes at night. She states she has only been taking metformin once daily and 20 units of Novolin N in the morning and 5 units at night. She has not been taking this as prescribed. She denies polyuria, polydipsia, or polyphagia. No significant weight changes. No hyper or hypoglycemic symptoms. She is due for her eye exam and will make an appointment with Dr. Nicoletta Dress.   2. Hypertension associated with type 2 diabetes mellitus ([place]) Compliant with medications without associated side effects. No chest pain, shortness of breath, leg swelling, headaches, weakness, confusion, or syncope.   3. Hyperlipidemia associated with type 2 diabetes mellitus ([place]) Compliant with medications - Yes Current medications - pravastatin Side effects from medications - No Diet - generally healthy Exercise - active daily  Lab Results  Component Value Date   CHOL 143 05/19/2019   HDL 74 05/19/2019   LDLCALC 56 05/19/2019   TRIG 66 05/19/2019   CHOLHDL 1.9 05/19/2019     Family and personal medical history reviewed. Smoking and ETOH history reviewed.    4. Age-related osteoporosis without current pathological fracture Was on  Fosamax weekly. States she stopped taking this months ago. She can not recall why she stopped taking it, was unaware if she still needed it. No OJN symptoms. She is not taking a calcium supplement.    Relevant past medical, surgical, family, and social history reviewed and updated as indicated.  Allergies and medications reviewed and updated. Date reviewed: Chart in Epic.   Past Medical History:  Diagnosis Date  . Diabetes mellitus   . GERD (gastroesophageal reflux disease)   . Hyperlipidemia   . Hypertension   . Osteoporosis     Past Surgical History:  Procedure Laterality Date  . COLONOSCOPY N/A 03/29/2016   Dr. Oneida Alar: Redundant left colon, nonbleeding internal hemorrhoids.  Consider colonoscopy in 10 to 15 years if benefits outweigh the risk   . ESOPHAGOGASTRODUODENOSCOPY N/A 03/29/2016   Dr. Oneida Alar: Small hiatal hernia, small sessile polyps found in the gastric fundus, benign fundic gland polyps, mild inflammation and erythema in the gastric antrum.  Duodenum was normal.  Biopsy negative for celiac.  Schatzki ring, patent.  Marland Kitchen TOTAL HIP ARTHROPLASTY  09/03/2011   Procedure: TOTAL HIP ARTHROPLASTY;  Surgeon: Mauri Pole, MD;  Location: [facility];  Service: Orthopedics;  Laterality: Left;    Social History   Socioeconomic History  . Marital status: Widowed    Spouse name: Not on file  . Number of children: 2  . Years of education: 67  . Highest education level: High school graduate  Occupational History  . Occupation: retired  Scientific laboratory technician  . Financial resource strain: Not hard at all  .  Food insecurity    Worry: Never true    Inability: Never true  . Transportation needs    Medical: No    Non-medical: No  Tobacco Use  . Smoking status: Never Smoker  . Smokeless tobacco: Never Used  Substance and Sexual Activity  . Alcohol use: Yes    Comment: occasional wine  . Drug use: No  . Sexual activity: Not Currently    Birth control/protection: None  Lifestyle  . Physical  activity    Days per week: 2 days    Minutes per session: 60 min  . Stress: Only a little  Relationships  . Social connections    Talks on phone: More than three times a week    Gets together: More than three times a week    Attends religious service: More than 4 times per year    Active member of club or organization: Yes    Attends meetings of clubs or organizations: More than 4 times per year    Relationship status: Widowed  . Intimate partner violence    Fear of current or ex partner: No    Emotionally abused: No    Physically abused: No    Forced sexual activity: No  Other Topics Concern  . Not on file  Social History Narrative  . Not on file    Outpatient Encounter Medications as of 05/19/2019  Medication Sig  . amLODipine (NORVASC) 5 MG tablet Take 1 tablet (5 mg total) by mouth daily.  Marland Kitchen amoxicillin-clavulanate (AUGMENTIN) 875-125 MG tablet Take 1 tablet by mouth 2 (two) times daily.  Marland Kitchen aspirin EC 81 MG tablet Take 81 mg by mouth daily.  Marland Kitchen azelastine (ASTELIN) 0.1 % nasal spray Place 1 spray into both nostrils 2 (two) times daily.  . benazepril (LOTENSIN) 40 MG tablet Take 1 tablet (40 mg total) by mouth daily.  . Cholecalciferol (VITAMIN D-3 PO) Take 2,000 Units by mouth daily.  . fluticasone (FLONASE) 50 MCG/ACT nasal spray Place 2 sprays into both nostrils daily.  Marland Kitchen glucose blood (ACCU-CHEK AVIVA PLUS) test strip USE TO CHECK GLUCOSE 6 TIMES DAILY Dx E11.9  . insulin NPH Human (HUMULIN N) 100 UNIT/ML injection INJECT 30 UNITS SUBCUTANEOUSLY IN THE MORNING AND 5 UNITS IN THE EVENING  . metFORMIN (GLUCOPHAGE) 500 MG tablet Take 1 tablet (500 mg total) by mouth 2 (two) times daily with a meal.  . metoprolol tartrate (LOPRESSOR) 25 MG tablet Take 1 tablet (25 mg total) by mouth 2 (two) times daily.  . polyethylene glycol powder (GLYCOLAX/MIRALAX) 17 GM/SCOOP powder Take 17 g by mouth 2 (two) times daily as needed.  . pravastatin (PRAVACHOL) 40 MG tablet Take 1 tablet (40  mg total) by mouth daily.  . RABEprazole (ACIPHEX) 20 MG tablet Take 1 tablet (20 mg total) by mouth 2 (two) times daily before a meal.  . [DISCONTINUED] insulin NPH Human (HUMULIN N) 100 UNIT/ML injection INJECT 25 UNITS SUBCUTANEOUSLY IN THE MORNING AND 10 UNITS IN THE EVENING (Patient taking differently: INJECT 20 UNITS SUBCUTANEOUSLY IN THE MORNING AND 5 UNITS IN THE EVENING)  . [DISCONTINUED] metFORMIN (GLUCOPHAGE) 500 MG tablet Take 2 tablets (1,000 mg total) by mouth 2 (two) times daily with a meal. (Patient taking differently: Take 500 mg by mouth daily with breakfast. )  . alendronate (FOSAMAX) 70 MG tablet TAKE ONE TABLET BY MOUTH ONCE A WEEK WITH A FULL GLASS OF WATER ON AN EMPTY STOMACH  . hydrochlorothiazide (HYDRODIURIL) 25 MG tablet Take 1 tablet (25  mg total) by mouth daily.  . insulin regular (NOVOLIN R) 100 units/mL injection Inject 0.03 mLs (3 Units total) into the skin 3 (three) times daily before meals. (Patient taking differently: Inject 3 Units into the skin 3 (three) times daily before meals. PRN)  . ketoconazole (NIZORAL) 2 % shampoo Apply to scalp and skin.  Leave on for 10 minutes and then rinse off every 4 days for 4 weeks. (Patient not taking: Reported on 05/19/2019)  . valACYclovir (VALTREX) 1000 MG tablet 2g twice daily for 2 doses, start as soon as symptoms begin (Patient not taking: Reported on 05/19/2019)  . [DISCONTINUED] alendronate (FOSAMAX) 70 MG tablet TAKE ONE TABLET BY MOUTH ONCE A WEEK WITH A FULL GLASS OF WATER ON AN EMPTY STOMACH (Patient not taking: Reported on 04/19/2019)  . [DISCONTINUED] baclofen (LIORESAL) 10 MG tablet Take 1 tablet (10 mg total) by mouth 3 (three) times daily.  . [DISCONTINUED] diclofenac (VOLTAREN) 75 MG EC tablet Take 1 tablet (75 mg total) by mouth 2 (two) times daily. (Patient not taking: Reported on 05/19/2019)   No facility-administered encounter medications on file as of 05/19/2019.     Allergies  Allergen Reactions  . Sulfa  Antibiotics   . Sulfa Drugs Cross Reactors Rash    Review of Systems  Constitutional: Positive for fatigue (intermittent, improving). Negative for activity change, appetite change, chills, diaphoresis, fever and unexpected weight change.  HENT: Negative.   Eyes: Negative.  Negative for photophobia and visual disturbance.  Respiratory: Negative for cough, chest tightness and shortness of breath.   Cardiovascular: Negative for chest pain, palpitations and leg swelling.  Gastrointestinal: Positive for constipation (Miralax as needed, was on Amitiza, no longer taking). Negative for abdominal pain, blood in stool, diarrhea, nausea and vomiting.  Endocrine: Negative.  Negative for cold intolerance, heat intolerance, polydipsia, polyphagia and polyuria.  Genitourinary: Negative for decreased urine volume, difficulty urinating, dysuria, frequency and urgency.  Musculoskeletal: Negative for arthralgias and myalgias.  Skin: Negative.  Negative for color change and rash.  Allergic/Immunologic: Negative.   Neurological: Negative for dizziness, tremors, seizures, syncope, facial asymmetry, speech difficulty, weakness, light-headedness, numbness and headaches.  Hematological: Negative.   Psychiatric/Behavioral: Negative for agitation, behavioral problems, confusion, decreased concentration, dysphoric mood, hallucinations, self-injury, sleep disturbance and suicidal ideas. The patient is not nervous/anxious.   All other systems reviewed and are negative.       Objective:  BP (!) 130/59   Pulse (!) 59   Temp 97.8 F (36.6 C)   Resp 20   Ht '5\' 5"'  (1.651 m)   Wt 141 lb (64 kg)   SpO2 94%   BMI 23.46 kg/m    Wt Readings from Last 3 Encounters:  05/19/19 141 lb (64 kg)  03/08/19 138 lb 12.8 oz (63 kg)  02/09/19 138 lb 3.2 oz (62.7 kg)    Physical Exam Vitals signs and nursing note reviewed.  Constitutional:      General: She is not in acute distress.    Appearance: Normal appearance. She is  well-developed, well-groomed and normal weight. She is not ill-appearing, toxic-appearing or diaphoretic.  HENT:     Head: Normocephalic and atraumatic.     Jaw: There is normal jaw occlusion.     Right Ear: Hearing, tympanic membrane, ear canal and external ear normal.     Left Ear: Hearing, tympanic membrane, ear canal and external ear normal.     Nose: Nose normal.     Mouth/Throat:     Lips: Pink.  Mouth: Mucous membranes are moist.     Pharynx: Oropharynx is clear. Uvula midline.  Eyes:     General: Lids are normal.     Extraocular Movements: Extraocular movements intact.     Conjunctiva/sclera: Conjunctivae normal.     Pupils: Pupils are equal, round, and reactive to light.  Neck:     Musculoskeletal: Normal range of motion and neck supple.     Thyroid: No thyroid mass, thyromegaly or thyroid tenderness.     Vascular: No carotid bruit or JVD.     Trachea: Trachea and phonation normal.  Cardiovascular:     Rate and Rhythm: Normal rate and regular rhythm.     Chest Wall: PMI is not displaced.     Pulses: Normal pulses.     Heart sounds: Normal heart sounds. No murmur. No friction rub. No gallop.   Pulmonary:     Effort: Pulmonary effort is normal. No respiratory distress.     Breath sounds: Normal breath sounds. No wheezing.  Abdominal:     General: Bowel sounds are normal. There is no distension or abdominal bruit.     Palpations: Abdomen is soft. There is no hepatomegaly or splenomegaly.     Tenderness: There is no abdominal tenderness. There is no right CVA tenderness or left CVA tenderness.     Hernia: No hernia is present.  Musculoskeletal: Normal range of motion.     Right lower leg: No edema.     Left lower leg: No edema.  Lymphadenopathy:     Cervical: No cervical adenopathy.  Skin:    General: Skin is warm and dry.     Capillary Refill: Capillary refill takes less than 2 seconds.     Coloration: Skin is not cyanotic, jaundiced or pale.     Findings: No rash.   Neurological:     General: No focal deficit present.     Mental Status: She is alert and oriented to person, place, and time.     Cranial Nerves: Cranial nerves are intact. No cranial nerve deficit.     Sensory: Sensation is intact. No sensory deficit.     Motor: Motor function is intact. No weakness.     Coordination: Coordination is intact. Coordination normal.     Gait: Gait is intact. Gait normal.     Deep Tendon Reflexes: Reflexes are normal and symmetric. Reflexes normal.  Psychiatric:        Attention and Perception: Attention and perception normal.        Mood and Affect: Mood and affect normal.        Speech: Speech normal.        Behavior: Behavior normal. Behavior is cooperative.        Thought Content: Thought content normal.        Cognition and Memory: Cognition and memory normal.        Judgment: Judgment normal.     Results for orders placed or performed in visit on 12/11/18  Bayer DCA Hb A1c Waived  Result Value Ref Range   HB A1C (BAYER DCA - WAIVED) 7.8 (H) <7.0 %  VITAMIN D 25 Hydroxy (Vit-D Deficiency, Fractures)  Result Value Ref Range   Vit D, 25-Hydroxy 70.9 30.0 - 100.0 ng/mL  Vitamin B12  Result Value Ref Range   Vitamin B-12 404 232 - 1,245 pg/mL  TSH  Result Value Ref Range   TSH 4.140 0.450 - 4.500 uIU/mL  Lipid panel  Result Value Ref Range   Cholesterol,  Total 163 100 - 199 mg/dL   Triglycerides 57 0 - 149 mg/dL   HDL 88 >39 mg/dL   VLDL Cholesterol Cal 11 5 - 40 mg/dL   LDL Calculated 64 0 - 99 mg/dL   Chol/HDL Ratio 1.9 0.0 - 4.4 ratio  CMP14+EGFR  Result Value Ref Range   Glucose 208 (H) 65 - 99 mg/dL   BUN 15 8 - 27 mg/dL   Creatinine, Ser 0.75 0.57 - 1.00 mg/dL   GFR calc non Af Amer 78 >59 mL/min/1.73   GFR calc Af Amer 90 >59 mL/min/1.73   BUN/Creatinine Ratio 20 12 - 28   Sodium 142 134 - 144 mmol/L   Potassium 4.3 3.5 - 5.2 mmol/L   Chloride 101 96 - 106 mmol/L   CO2 30 (H) 20 - 29 mmol/L   Calcium 9.4 8.7 - 10.3 mg/dL    Total Protein 6.4 6.0 - 8.5 g/dL   Albumin 4.4 3.7 - 4.7 g/dL   Globulin, Total 2.0 1.5 - 4.5 g/dL   Albumin/Globulin Ratio 2.2 1.2 - 2.2   Bilirubin Total 0.4 0.0 - 1.2 mg/dL   Alkaline Phosphatase 68 39 - 117 IU/L   AST 21 0 - 40 IU/L   ALT 17 0 - 32 IU/L  CBC with Differential/Platelet  Result Value Ref Range   WBC 5.0 3.4 - 10.8 x10E3/uL   RBC 3.74 (L) 3.77 - 5.28 x10E6/uL   Hemoglobin 11.8 11.1 - 15.9 g/dL   Hematocrit 34.7 34.0 - 46.6 %   MCV 93 79 - 97 fL   MCH 31.6 26.6 - 33.0 pg   MCHC 34.0 31.5 - 35.7 g/dL   RDW 12.8 11.7 - 15.4 %   Platelets 196 150 - 450 x10E3/uL   Neutrophils 46 Not Estab. %   Lymphs 35 Not Estab. %   Monocytes 8 Not Estab. %   Eos 8 Not Estab. %   Basos 3 Not Estab. %   Neutrophils Absolute 2.3 1.4 - 7.0 x10E3/uL   Lymphocytes Absolute 1.8 0.7 - 3.1 x10E3/uL   Monocytes Absolute 0.4 0.1 - 0.9 x10E3/uL   EOS (ABSOLUTE) 0.4 0.0 - 0.4 x10E3/uL   Basophils Absolute 0.1 0.0 - 0.2 x10E3/uL   Immature Granulocytes 0 Not Estab. %   Immature Grans (Abs) 0.0 0.0 - 0.1 x10E3/uL       Pertinent labs & imaging results that were available during my care of the patient were reviewed by me and considered in my medical decision making.  Assessment & Plan:  Susan Petty was seen today for medical management of chronic issues, diabetes, hypertension and hyperlipidemia.  Diagnoses and all orders for this visit:  Type 2 diabetes mellitus with insulin therapy (HCC) A1C 7.9 today. Pt is not taking medications as prescribed. Discussed the importance of taking medications as prescribed. Will increase morning dose of Novolin N to 30 nits and leave the night time dose 5 units. Pt agrees to take Metformin 500 mg twice daily instead of once. Follow up in 3 months.  -     CBC with Differential/Platelet -     Bayer DCA Hb A1c Waived -     insulin NPH Human (HUMULIN N) 100 UNIT/ML injection; INJECT 30 UNITS SUBCUTANEOUSLY IN THE MORNING AND 5 UNITS IN THE EVENING -      metFORMIN (GLUCOPHAGE) 500 MG tablet; Take 1 tablet (500 mg total) by mouth 2 (two) times daily with a meal.  Hypertension associated with type 2 diabetes mellitus (HCC) BP well  controlled. Changes were not made in regimen. Goal BP is 130/80. Pt aware to report any persistent high or low readings. DASH diet and exercise encouraged. Exercise at least 150 minutes per week and increase as tolerated. Goal BMI > 25. Stress management encouraged. Avoid nicotine and tobacco product use. Avoid excessive alcohol and NSAID's. Avoid more than 2000 mg of sodium daily. Medications as prescribed. Follow up as scheduled.  -     CBC with Differential/Platelet -     CMP14+EGFR  Hyperlipidemia associated with type 2 diabetes mellitus ([place]) Diet encouraged - increase intake of fresh fruits and vegetables, increase intake of lean proteins. Bake, broil, or grill foods. Avoid fried, greasy, and fatty foods. Avoid fast foods. Increase intake of fiber-rich whole grains. Exercise encouraged - at least 150 minutes per week and advance as tolerated.  Goal BMI < 25. Continue medications as prescribed. Follow up in 3-6 months as discussed.  -     CBC with Differential/Platelet -     Lipid panel  Age-related osteoporosis without current pathological fracture Pt will restart Fosamax and calcium supplement. Pt aware to report symptoms of OJN and to report if she develops any.  -     alendronate (FOSAMAX) 70 MG tablet; TAKE ONE TABLET BY MOUTH ONCE A WEEK WITH A FULL GLASS OF WATER ON AN EMPTY STOMACH     Continue all other maintenance medications.  Total time spent with patient 45 mintues.  Greater than 50% of encounter spent in coordination of care/counseling.  Follow up plan: Return in about 3 months (around 08/19/2019), or if symptoms worsen or fail to improve, for DM.  Continue healthy lifestyle choices, including diet (rich in fruits, vegetables, and lean proteins, and low in salt and simple carbohydrates) and  exercise (at least 30 minutes of moderate physical activity daily).  Educational handout given for DM  The above assessment and management plan was discussed with the patient. The patient verbalized understanding of and has agreed to the management plan. Patient is aware to call the clinic if they develop any new symptoms or if symptoms persist or worsen. Patient is aware when to return to the clinic for a follow-up visit. Patient educated on when it is appropriate to go to the emergency department.   Monia Pouch, FNP-C [facility] [phone]

## 2019-06-02 ENCOUNTER — Other Ambulatory Visit: Payer: Self-pay

## 2019-06-02 ENCOUNTER — Encounter: Payer: Self-pay | Admitting: Family Medicine

## 2019-06-02 ENCOUNTER — Ambulatory Visit (INDEPENDENT_AMBULATORY_CARE_PROVIDER_SITE_OTHER): Payer: Medicare Other | Admitting: Family Medicine

## 2019-06-02 DIAGNOSIS — H9203 Otalgia, bilateral: Secondary | ICD-10-CM

## 2019-06-02 MED ORDER — CEFDINIR 300 MG PO CAPS: 300.0000 mg | ORAL_CAPSULE | Freq: Two times a day (BID) | ORAL | 0 refills | Status: DC

## 2019-06-02 NOTE — Progress Notes (Signed)
Virtual Visit via telephone Note Due to COVID-19 pandemic this visit was conducted virtually. This visit type was conducted due to national recommendations for restrictions regarding the COVID-19 Pandemic (e.g. social distancing, sheltering in place) in an effort to limit this patient's exposure and mitigate transmission in our community. All issues noted in this document were discussed and addressed.  A physical exam was not performed with this format.   I connected with Susan Petty on 06/02/2019 at 0830 by telephone and verified that I am speaking with the correct person using two identifiers. Susan Petty is currently located at home and family is currently with them during visit. The provider, Monia Pouch, FNP is located in their office at time of visit.  I discussed the limitations, risks, security and privacy concerns of performing an evaluation and management service by telephone and the availability of in person appointments. I also discussed with the patient that there may be a patient responsible charge related to this service. The patient expressed understanding and agreed to proceed.  Subjective:  Patient ID: Susan Petty, female    DOB: 23-Apr-1946, 73 y.o.   MRN: [id number]  Chief Complaint:  Otalgia   HPI: Susan Petty is a 73 y.o. female presenting on 06/02/2019 for Otalgia   Pt reports she had an URI with cough, congestion, and sinus pressure 3 weeks ago. States she was better for about a week and then both of her ears started hurting really bad. States the pain is not subsiding and is getting worse.   Otalgia  There is pain in both ears. This is a recurrent problem. The current episode started 1 to 4 weeks ago. The problem occurs constantly. The problem has been gradually worsening. There has been no fever. The pain is at a severity of 5/10. The pain is moderate. Associated symptoms include headaches and a sore throat. Pertinent negatives include no abdominal  pain, coughing, diarrhea, ear discharge, Petty loss, neck pain, rash, rhinorrhea or vomiting. She has tried acetaminophen for the symptoms. The treatment provided no relief.     Relevant past medical, surgical, family, and social history reviewed and updated as indicated.  Allergies and medications reviewed and updated.   Past Medical History:  Diagnosis Date  . Diabetes mellitus   . GERD (gastroesophageal reflux disease)   . Hyperlipidemia   . Hypertension   . Osteoporosis     Past Surgical History:  Procedure Laterality Date  . COLONOSCOPY N/A 03/29/2016   Dr. Oneida Alar: Redundant left colon, nonbleeding internal hemorrhoids.  Consider colonoscopy in 10 to 15 years if benefits outweigh the risk   . ESOPHAGOGASTRODUODENOSCOPY N/A 03/29/2016   Dr. Oneida Alar: Small hiatal hernia, small sessile polyps found in the gastric fundus, benign fundic gland polyps, mild inflammation and erythema in the gastric antrum.  Duodenum was normal.  Biopsy negative for celiac.  Schatzki ring, patent.  Marland Kitchen TOTAL HIP ARTHROPLASTY  09/03/2011   Procedure: TOTAL HIP ARTHROPLASTY;  Surgeon: Mauri Pole, MD;  Location: [facility];  Service: Orthopedics;  Laterality: Left;    Social History   Socioeconomic History  . Marital status: Widowed    Spouse name: Not on file  . Number of children: 2  . Years of education: 1  . Highest education level: High school graduate  Occupational History  . Occupation: retired  Scientific laboratory technician  . Financial resource strain: Not hard at all  . Food insecurity    Worry: Never true    Inability: Never  true  . Transportation needs    Medical: No    Non-medical: No  Tobacco Use  . Smoking status: Never Smoker  . Smokeless tobacco: Never Used  Substance and Sexual Activity  . Alcohol use: Yes    Comment: occasional wine  . Drug use: No  . Sexual activity: Not Currently    Birth control/protection: None  Lifestyle  . Physical activity    Days per week: 2 days    Minutes  per session: 60 min  . Stress: Only a little  Relationships  . Social connections    Talks on phone: More than three times a week    Gets together: More than three times a week    Attends religious service: More than 4 times per year    Active member of club or organization: Yes    Attends meetings of clubs or organizations: More than 4 times per year    Relationship status: Widowed  . Intimate partner violence    Fear of current or ex partner: No    Emotionally abused: No    Physically abused: No    Forced sexual activity: No  Other Topics Concern  . Not on file  Social History Narrative  . Not on file    Outpatient Encounter Medications as of 06/02/2019  Medication Sig  . alendronate (FOSAMAX) 70 MG tablet TAKE ONE TABLET BY MOUTH ONCE A WEEK WITH A FULL GLASS OF WATER ON AN EMPTY STOMACH  . amLODipine (NORVASC) 5 MG tablet Take 1 tablet (5 mg total) by mouth daily.  Marland Kitchen aspirin EC 81 MG tablet Take 81 mg by mouth daily.  Marland Kitchen azelastine (ASTELIN) 0.1 % nasal spray Place 1 spray into both nostrils 2 (two) times daily.  . benazepril (LOTENSIN) 40 MG tablet Take 1 tablet (40 mg total) by mouth daily.  . cefdinir (OMNICEF) 300 MG capsule Take 1 capsule (300 mg total) by mouth 2 (two) times daily. 1 po BID  . Cholecalciferol (VITAMIN D-3 PO) Take 2,000 Units by mouth daily.  . fluticasone (FLONASE) 50 MCG/ACT nasal spray Place 2 sprays into both nostrils daily.  Marland Kitchen glucose blood (ACCU-CHEK AVIVA PLUS) test strip USE TO CHECK GLUCOSE 6 TIMES DAILY Dx E11.9  . hydrochlorothiazide (HYDRODIURIL) 25 MG tablet Take 1 tablet (25 mg total) by mouth daily.  . insulin NPH Human (HUMULIN N) 100 UNIT/ML injection INJECT 30 UNITS SUBCUTANEOUSLY IN THE MORNING AND 5 UNITS IN THE EVENING  . insulin regular (NOVOLIN R) 100 units/mL injection Inject 0.03 mLs (3 Units total) into the skin 3 (three) times daily before meals. (Patient taking differently: Inject 3 Units into the skin 3 (three) times daily  before meals. PRN)  . ketoconazole (NIZORAL) 2 % shampoo Apply to scalp and skin.  Leave on for 10 minutes and then rinse off every 4 days for 4 weeks. (Patient not taking: Reported on 05/19/2019)  . metFORMIN (GLUCOPHAGE) 500 MG tablet Take 1 tablet (500 mg total) by mouth 2 (two) times daily with a meal.  . metoprolol tartrate (LOPRESSOR) 25 MG tablet Take 1 tablet (25 mg total) by mouth 2 (two) times daily.  . polyethylene glycol powder (GLYCOLAX/MIRALAX) 17 GM/SCOOP powder Take 17 g by mouth 2 (two) times daily as needed.  . pravastatin (PRAVACHOL) 40 MG tablet Take 1 tablet (40 mg total) by mouth daily.  . RABEprazole (ACIPHEX) 20 MG tablet Take 1 tablet (20 mg total) by mouth 2 (two) times daily before a meal.  . valACYclovir (  VALTREX) 1000 MG tablet 2g twice daily for 2 doses, start as soon as symptoms begin (Patient not taking: Reported on 05/19/2019)  . [DISCONTINUED] amoxicillin-clavulanate (AUGMENTIN) 875-125 MG tablet Take 1 tablet by mouth 2 (two) times daily.   No facility-administered encounter medications on file as of 06/02/2019.     Allergies  Allergen Reactions  . Sulfa Antibiotics   . Sulfa Drugs Cross Reactors Rash    Review of Systems  Constitutional: Negative for activity change, appetite change, chills, diaphoresis, fatigue, fever and unexpected weight change.  HENT: Positive for ear pain and sore throat. Negative for congestion, ear discharge, Petty loss, postnasal drip, rhinorrhea, sinus pressure and sinus pain.   Eyes: Negative.  Negative for photophobia and visual disturbance.  Respiratory: Negative for cough, chest tightness and shortness of breath.   Cardiovascular: Negative for chest pain, palpitations and leg swelling.  Gastrointestinal: Negative for abdominal pain, blood in stool, constipation, diarrhea, nausea and vomiting.  Endocrine: Negative.   Genitourinary: Negative for decreased urine volume, difficulty urinating, dysuria, frequency and urgency.   Musculoskeletal: Negative for arthralgias, myalgias and neck pain.  Skin: Negative.  Negative for rash.  Allergic/Immunologic: Negative.   Neurological: Positive for headaches. Negative for dizziness and weakness.  Hematological: Negative.   Psychiatric/Behavioral: Negative for confusion, hallucinations, sleep disturbance and suicidal ideas.  All other systems reviewed and are negative.        Observations/Objective: No vital signs or physical exam, this was a telephone or virtual health encounter.  Pt alert and oriented, answers all questions appropriately, and able to speak in full sentences.    Assessment and Plan: I[place]hia was seen today for otalgia.  Diagnoses and all orders for this visit:  Acute otalgia, bilateral Reported symptoms concerning for double sickening causing bilateral otitis media. Will treat with below. Symptomatic care discussed in detail.  -     cefdinir (OMNICEF) 300 MG capsule; Take 1 capsule (300 mg total) by mouth 2 (two) times daily. 1 po BID     Follow Up Instructions: Return if symptoms worsen or fail to improve.    I discussed the assessment and treatment plan with the patient. The patient was provided an opportunity to ask questions and all were answered. The patient agreed with the plan and demonstrated an understanding of the instructions.   The patient was advised to call back or seek an in-person evaluation if the symptoms worsen or if the condition fails to improve as anticipated.  The above assessment and management plan was discussed with the patient. The patient verbalized understanding of and has agreed to the management plan. Patient is aware to call the clinic if they develop any new symptoms or if symptoms persist or worsen. Patient is aware when to return to the clinic for a follow-up visit. Patient educated on when it is appropriate to go to the emergency department.    I provided 15 minutes of non-face-to-face time during this  encounter. The call started at 0830. The call ended at [place]. The other time was used for coordination of care.    Monia Pouch, FNP-C [facility] [address] [place], [place] [address] [phone] 06/02/2019

## 2019-07-21 DIAGNOSIS — X32XXXD Exposure to sunlight, subsequent encounter: Secondary | ICD-10-CM | POA: Diagnosis not present

## 2019-07-21 DIAGNOSIS — L570 Actinic keratosis: Secondary | ICD-10-CM | POA: Diagnosis not present

## 2019-08-10 ENCOUNTER — Ambulatory Visit: Payer: Medicare Other | Admitting: Gastroenterology

## 2019-08-10 ENCOUNTER — Encounter: Payer: Self-pay | Admitting: Gastroenterology

## 2019-08-10 ENCOUNTER — Other Ambulatory Visit: Payer: Self-pay

## 2019-08-10 VITALS — BP 170/65 | HR 59 | Temp 96.9°F | Ht 67.0 in | Wt 142.2 lb

## 2019-08-10 DIAGNOSIS — R140 Abdominal distension (gaseous): Secondary | ICD-10-CM

## 2019-08-10 DIAGNOSIS — K5900 Constipation, unspecified: Secondary | ICD-10-CM | POA: Diagnosis not present

## 2019-08-10 DIAGNOSIS — K219 Gastro-esophageal reflux disease without esophagitis: Secondary | ICD-10-CM | POA: Diagnosis not present

## 2019-08-10 NOTE — Patient Instructions (Signed)
1. Increase Amitiza to 41mcg twice daily with food. Use samples provided, do not use your prescription you have at home while taking samples.  2. Take omeprazole 40mg  daily, 30 minutes before breakfast and 30 minutes before your last snack of the evening.  3. Read below handouts. Try to cut back on any foods that contribute to reflux and bloating.  4. I will follow up on upcoming labs. We will touch base next week and see how you are doing with above changes. If no significant improvement, you may benefit from upper endoscopy or CT scan of your abdomin.    Food Choices for Gastroesophageal Reflux Disease, Adult When you have gastroesophageal reflux disease (GERD), the foods you eat and your eating habits are very important. Choosing the right foods can help ease the discomfort of GERD. Consider working with a diet and nutrition specialist (dietitian) to help you make healthy food choices. What general guidelines should I follow?  Eating plan  Choose healthy foods low in fat, such as fruits, vegetables, whole grains, low-fat dairy products, and lean meat, fish, and poultry.  Eat frequent, small meals instead of three large meals each day. Eat your meals slowly, in a relaxed setting. Avoid bending over or lying down until 2-3 hours after eating.  Limit high-fat foods such as fatty meats or fried foods.  Limit your intake of oils, butter, and shortening to less than 8 teaspoons each day.  Avoid the following: ? Foods that cause symptoms. These may be different for different people. Keep a food diary to keep track of foods that cause symptoms. ? Alcohol. ? Drinking large amounts of liquid with meals. ? Eating meals during the 2-3 hours before bed.  Cook foods using methods other than frying. This may include baking, grilling, or broiling. Lifestyle  Maintain a healthy weight. Ask your health care provider what weight is healthy for you. If you need to lose weight, work with your health care  provider to do so safely.  Exercise for at least 30 minutes on 5 or more days each week, or as told by your health care provider.  Avoid wearing clothes that fit tightly around your waist and chest.  Do not use any products that contain nicotine or tobacco, such as cigarettes and e-cigarettes. If you need help quitting, ask your health care provider.  Sleep with the head of your bed raised. Use a wedge under the mattress or blocks under the bed frame to raise the head of the bed. What foods are not recommended? The items listed may not be a complete list. Talk with your dietitian about what dietary choices are best for you. Grains Pastries or quick breads with added fat. [place] toast. Vegetables Deep fried vegetables. [place] fries. Any vegetables prepared with added fat. Any vegetables that cause symptoms. For some people this may include tomatoes and tomato products, chili peppers, onions and garlic, and horseradish. Fruits Any fruits prepared with added fat. Any fruits that cause symptoms. For some people this may include citrus fruits, such as oranges, grapefruit, pineapple, and lemons. Meats and other protein foods High-fat meats, such as fatty beef or pork, hot dogs, ribs, ham, sausage, salami and bacon. Fried meat or protein, including fried fish and fried chicken. Nuts and nut butters. Dairy Whole milk and chocolate milk. Sour cream. Cream. Ice cream. Cream cheese. Milk shakes. Beverages Coffee and tea, with or without caffeine. Carbonated beverages. Sodas. Energy drinks. Fruit juice made with acidic fruits (such as orange or  grapefruit). Tomato juice. Alcoholic drinks. Fats and oils Butter. Margarine. Shortening. Ghee. Sweets and desserts Chocolate and cocoa. Donuts. Seasoning and other foods Pepper. Peppermint and spearmint. Any condiments, herbs, or seasonings that cause symptoms. For some people, this may include curry, hot sauce, or vinegar-based salad  dressings. Summary  When you have gastroesophageal reflux disease (GERD), food and lifestyle choices are very important to help ease the discomfort of GERD.  Eat frequent, small meals instead of three large meals each day. Eat your meals slowly, in a relaxed setting. Avoid bending over or lying down until 2-3 hours after eating.  Limit high-fat foods such as fatty meat or fried foods. This information is not intended to replace advice given to you by your health care provider. Make sure you discuss any questions you have with your health care provider. Document Revised: 10/15/2018 Document Reviewed: 06/25/2016 Elsevier Patient Education  [place].   Gastroesophageal Reflux Disease, Adult Gastroesophageal reflux (GER) happens when acid from the stomach flows up into the tube that connects the mouth and the stomach (esophagus). Normally, food travels down the esophagus and stays in the stomach to be digested. However, when a person has GER, food and stomach acid sometimes move back up into the esophagus. If this becomes a more serious problem, the person may be diagnosed with a disease called gastroesophageal reflux disease (GERD). GERD occurs when the reflux:  Happens often.  Causes frequent or severe symptoms.  Causes problems such as damage to the esophagus. When stomach acid comes in contact with the esophagus, the acid may cause soreness (inflammation) in the esophagus. Over time, GERD may create small holes (ulcers) in the lining of the esophagus. What are the causes? This condition is caused by a problem with the muscle between the esophagus and the stomach (lower esophageal sphincter, or LES). Normally, the LES muscle closes after food passes through the esophagus to the stomach. When the LES is weakened or abnormal, it does not close properly, and that allows food and stomach acid to go back up into the esophagus. The LES can be weakened by certain dietary substances,  medicines, and medical conditions, including:  Tobacco use.  Pregnancy.  Having a hiatal hernia.  Alcohol use.  Certain foods and beverages, such as coffee, chocolate, onions, and peppermint. What increases the risk? You are more likely to develop this condition if you:  Have an increased body weight.  Have a connective tissue disorder.  Use NSAID medicines. What are the signs or symptoms? Symptoms of this condition include:  Heartburn.  Difficult or painful swallowing.  The feeling of having a lump in the throat.  Abitter taste in the mouth.  Bad breath.  Having a large amount of saliva.  Having an upset or bloated stomach.  Belching.  Chest pain. Different conditions can cause chest pain. Make sure you see your health care provider if you experience chest pain.  Shortness of breath or wheezing.  Ongoing (chronic) cough or a night-time cough.  Wearing away of tooth enamel.  Weight loss. How is this diagnosed? Your health care provider will take a medical history and perform a physical exam. To determine if you have mild or severe GERD, your health care provider may also monitor how you respond to treatment. You may also have tests, including:  A test to examine your stomach and esophagus with a small camera (endoscopy).  A test thatmeasures the acidity level in your esophagus.  A test thatmeasures how much pressure is  on your esophagus.  A barium swallow or modified barium swallow test to show the shape, size, and functioning of your esophagus. How is this treated? The goal of treatment is to help relieve your symptoms and to prevent complications. Treatment for this condition may vary depending on how severe your symptoms are. Your health care provider may recommend:  Changes to your diet.  Medicine.  Surgery. Follow these instructions at home: Eating and drinking   Follow a diet as recommended by your health care provider. This may involve  avoiding foods and drinks such as: ? Coffee and tea (with or without caffeine). ? Drinks that containalcohol. ? Energy drinks and sports drinks. ? Carbonated drinks or sodas. ? Chocolate and cocoa. ? Peppermint and mint flavorings. ? Garlic and onions. ? Horseradish. ? Spicy and acidic foods, including peppers, chili powder, curry powder, vinegar, hot sauces, and barbecue sauce. ? Citrus fruit juices and citrus fruits, such as oranges, lemons, and limes. ? Tomato-based foods, such as red sauce, chili, salsa, and pizza with red sauce. ? Fried and fatty foods, such as donuts, french fries, potato chips, and high-fat dressings. ? High-fat meats, such as hot dogs and fatty cuts of red and white meats, such as rib eye steak, sausage, ham, and bacon. ? High-fat dairy items, such as whole milk, butter, and cream cheese.  Eat small, frequent meals instead of large meals.  Avoid drinking large amounts of liquid with your meals.  Avoid eating meals during the 2-3 hours before bedtime.  Avoid lying down right after you eat.  Do not exercise right after you eat. Lifestyle   Do not use any products that contain nicotine or tobacco, such as cigarettes, e-cigarettes, and chewing tobacco. If you need help quitting, ask your health care provider.  Try to reduce your stress by using methods such as yoga or meditation. If you need help reducing stress, ask your health care provider.  If you are overweight, reduce your weight to an amount that is healthy for you. Ask your health care provider for guidance about a safe weight loss goal. General instructions  Pay attention to any changes in your symptoms.  Take over-the-counter and prescription medicines only as told by your health care provider. Do not take aspirin, ibuprofen, or other NSAIDs unless your health care provider told you to do so.  Wear loose-fitting clothing. Do not wear anything tight around your waist that causes pressure on your  abdomen.  Raise (elevate) the head of your bed about 6 inches (15 cm).  Avoid bending over if this makes your symptoms worse.  Keep all follow-up visits as told by your health care provider. This is important. Contact a health care provider if:  You have: ? New symptoms. ? Unexplained weight loss. ? Difficulty swallowing or it hurts to swallow. ? Wheezing or a persistent cough. ? A hoarse voice.  Your symptoms do not improve with treatment. Get help right away if you:  Have pain in your arms, neck, jaw, teeth, or back.  Feel sweaty, dizzy, or light-headed.  Have chest pain or shortness of breath.  Vomit and your vomit looks like blood or coffee grounds.  Faint.  Have stool that is bloody or black.  Cannot swallow, drink, or eat. Summary  Gastroesophageal reflux happens when acid from the stomach flows up into the esophagus. GERD is a disease in which the reflux happens often, causes frequent or severe symptoms, or causes problems such as damage to the esophagus.  Treatment for this condition may vary depending on how severe your symptoms are. Your health care provider may recommend diet and lifestyle changes, medicine, or surgery.  Contact a health care provider if you have new or worsening symptoms.  Take over-the-counter and prescription medicines only as told by your health care provider. Do not take aspirin, ibuprofen, or other NSAIDs unless your health care provider told you to do so.  Keep all follow-up visits as told by your health care provider. This is important. This information is not intended to replace advice given to you by your health care provider. Make sure you discuss any questions you have with your health care provider. Document Revised: 12/31/2017 Document Reviewed: 12/31/2017 Elsevier Patient Education  [place]  FODMAPs (fermentable oligosaccharides, disaccharides, monosaccharides, and polyols) are sugars  that are hard for some people to digest. A low-FODMAP eating plan may help some people who have bowel (intestinal) diseases to manage their symptoms. This meal plan can be complicated to follow. Work with a diet and nutrition specialist (dietitian) to make a low-FODMAP eating plan that is right for you. A dietitian can make sure that you get enough nutrition from this diet. What are tips for following this plan? Reading food labels  Check labels for hidden FODMAPs such as: ? High-fructose syrup. ? Honey. ? Agave. ? Natural fruit flavors. ? Onion or garlic powder.  Choose low-FODMAP foods that contain 3-4 grams of fiber per serving.  Check food labels for serving sizes. Eat only one serving at a time to make sure FODMAP levels stay low. Meal planning  Follow a low-FODMAP eating plan for up to 6 weeks, or as told by your health care provider or dietitian.  To follow the eating plan: 1. Eliminate high-FODMAP foods from your diet completely. 2. Gradually reintroduce high-FODMAP foods into your diet one at a time. Most people should wait a few days after introducing one high-FODMAP food before they introduce the next high-FODMAP food. Your dietitian can recommend how quickly you may reintroduce foods. 3. Keep a daily record of what you eat and drink, and make note of any symptoms that you have after eating. 4. Review your daily record with a dietitian regularly. Your dietitian can help you identify which foods you can eat and which foods you should avoid. General tips  Drink enough fluid each day to keep your urine pale yellow.  Avoid processed foods. These often have added sugar and may be high in FODMAPs.  Avoid most dairy products, whole grains, and sweeteners.  Work with a dietitian to make sure you get enough fiber in your diet. Recommended foods Grains  Gluten-free grains, such as rice, oats, buckwheat, quinoa, corn, polenta, and millet. Gluten-free pasta, bread, or cereal. Rice  noodles. Corn tortillas. Vegetables  Eggplant, zucchini, cucumber, peppers, green beans, Brussels sprouts, bean sprouts, lettuce, arugula, kale, Swiss chard, spinach, collard greens, bok choy, summer squash, potato, and tomato. Limited amounts of corn, carrot, and sweet potato. Green parts of scallions. Fruits  Bananas, oranges, lemons, limes, blueberries, raspberries, strawberries, grapes, cantaloupe, honeydew melon, kiwi, papaya, passion fruit, and pineapple. Limited amounts of dried cranberries, banana chips, and shredded coconut. Dairy  Lactose-free milk, yogurt, and kefir. Lactose-free cottage cheese and ice cream. Non-dairy milks, such as almond, coconut, hemp, and rice milk. Yogurts made of non-dairy milks. Limited amounts of goat cheese, brie, mozzarella, parmesan, swiss, and other hard cheeses. Meats and other protein foods  Unseasoned beef, pork, poultry,  or fish. Eggs. [place] Salines. Tofu (firm) and tempeh. Limited amounts of nuts and seeds, such as almonds, walnuts, [place] nuts, pecans, peanuts, pumpkin seeds, chia seeds, and sunflower seeds. Fats and oils  Butter-free spreads. Vegetable oils, such as olive, canola, and sunflower oil. Seasoning and other foods  Artificial sweeteners with names that do not end in "ol" such as aspartame, saccharine, and stevia. Maple syrup, white table sugar, raw sugar, brown sugar, and molasses. Fresh basil, coriander, parsley, rosemary, and thyme. Beverages  Water and mineral water. Sugar-sweetened soft drinks. Small amounts of orange juice or cranberry juice. Black and green tea. Most dry wines. Coffee. This may not be a complete list of low-FODMAP foods. Talk with your dietitian for more information. Foods to avoid Grains  Wheat, including kamut, durum, and semolina. Barley and bulgur. Couscous. Wheat-based cereals. Wheat noodles, bread, crackers, and pastries. Vegetables  Chicory root, artichoke, asparagus, cabbage, snow peas, sugar snap peas,  mushrooms, and cauliflower. Onions, garlic, leeks, and the white part of scallions. Fruits  Fresh, dried, and juiced forms of apple, pear, watermelon, peach, plum, cherries, apricots, blackberries, boysenberries, figs, nectarines, and mango. Avocado. Dairy  Milk, yogurt, ice cream, and soft cheese. Cream and sour cream. Milk-based sauces. Custard. Meats and other protein foods  Fried or fatty meat. Sausage. Cashews and pistachios. Soybeans, baked beans, black beans, chickpeas, kidney beans, fava beans, navy beans, lentils, and split peas. Seasoning and other foods  Any sugar-free gum or candy. Foods that contain artificial sweeteners such as sorbitol, mannitol, isomalt, or xylitol. Foods that contain honey, high-fructose corn syrup, or agave. Bouillon, vegetable stock, beef stock, and chicken stock. Garlic and onion powder. Condiments made with onion, such as hummus, chutney, pickles, relish, salad dressing, and salsa. Tomato paste. Beverages  Chicory-based drinks. Coffee substitutes. Chamomile tea. Fennel tea. Sweet or fortified wines such as port or sherry. Diet soft drinks made with isomalt, mannitol, maltitol, sorbitol, or xylitol. Apple, pear, and mango juice. Juices with high-fructose corn syrup. This may not be a complete list of high-FODMAP foods. Talk with your dietitian to discuss what dietary choices are best for you.  Summary  A low-FODMAP eating plan is a short-term diet that eliminates FODMAPs from your diet to help ease symptoms of certain bowel diseases.  The eating plan usually lasts up to 6 weeks. After that, high-FODMAP foods are restarted gradually, one at a time, so you can find out which may be causing symptoms.  A low-FODMAP eating plan can be complicated. It is best to work with a dietitian who has experience with this type of plan. This information is not intended to replace advice given to you by your health care provider. Make sure you discuss any questions you have  with your health care provider. Document Revised: 06/06/2017 Document Reviewed: 02/18/2017 Elsevier Patient Education  [place].

## 2019-08-10 NOTE — Progress Notes (Signed)
Primary Care Physician: Baruch Gouty, FNP  Primary Gastroenterologist:  Barney Drain, MD   Chief Complaint  Patient presents with  . Gastroesophageal Reflux    Omeprazole 40 mg    HPI: Susan Petty is a 74 y.o. female here for follow-up of GERD and constipation.  Last seen August 2020.  EGD and colonoscopy September 2017.  She had a patent Schatzki ring, small hiatal hernia, small gastric polyp, biopsy proven fundic gland, mild gastritis, normal small bowel biopsies.  Redundant left colon, nonbleeding internal hemorrhoids.  Consider colonoscopy in 10 to 15 years if benefits outweigh the risk.  She was switched from omeprazole to AcipHex twice daily at last office visit. Did not take Aciphex. Wanted to use up all omeprazole taking 40mg  bid. She states she is still taking the Amitiza 61mcg BID. If she misses a dose, then no BM. Sometimes takes a day after taking Amitiza for it to work.  Wonders if she is not emptying her bowels adequately enough and this is contributing to her bloating.  For the past 3 weeks she has had increased postprandial bloating, nausea.  Last several minutes off and on throughout the day.  No abdominal pain.  No melena or rectal bleeding.  Has tried antacids and Pepto without relief.  Continues to have a lot of nocturnal regurgitation and reflux.  Continues to sleep in recliner.  Eats supper at 4 PM.  Has a snack at 8 PM to regulate her sugars during the night.  Generally eats 1/2 banana sandwich.  Takes omeprazole after breakfast and after her 4 PM meal.  Goes for labs tomorrow with her PCP.  Not interested in upper endoscopy at this time but may consider in the near future.  Current Outpatient Medications  Medication Sig Dispense Refill  . alendronate (FOSAMAX) 70 MG tablet TAKE ONE TABLET BY MOUTH ONCE A WEEK WITH A FULL GLASS OF WATER ON AN EMPTY STOMACH 4 tablet 2  . amLODipine (NORVASC) 5 MG tablet Take 1 tablet (5 mg total) by mouth daily. 90 tablet  1  . aspirin EC 81 MG tablet Take 81 mg by mouth daily.    Marland Kitchen azelastine (ASTELIN) 0.1 % nasal spray Place 1 spray into both nostrils 2 (two) times daily. 30 mL 12  . benazepril (LOTENSIN) 40 MG tablet Take 1 tablet (40 mg total) by mouth daily. 90 tablet 1  . Cholecalciferol (VITAMIN D-3 PO) Take 2,000 Units by mouth daily.    . fluticasone (FLONASE) 50 MCG/ACT nasal spray Place 2 sprays into both nostrils daily. 16 g 6  . glucose blood (ACCU-CHEK AVIVA PLUS) test strip USE TO CHECK GLUCOSE 6 TIMES DAILY Dx E11.9 600 each 3  . hydrochlorothiazide (HYDRODIURIL) 25 MG tablet Take 1 tablet (25 mg total) by mouth daily. 90 tablet 1  . insulin NPH Human (HUMULIN N) 100 UNIT/ML injection INJECT 30 UNITS SUBCUTANEOUSLY IN THE MORNING AND 5 UNITS IN THE EVENING 30 mL 3  . insulin regular (NOVOLIN R) 100 units/mL injection Inject 0.03 mLs (3 Units total) into the skin 3 (three) times daily before meals. (Patient taking differently: Inject 3 Units into the skin 3 (three) times daily before meals. PRN) 7.5 mL 1  . ketoconazole (NIZORAL) 2 % shampoo Apply to scalp and skin.  Leave on for 10 minutes and then rinse off every 4 days for 4 weeks. 120 mL 0  . lubiprostone (AMITIZA) 8 MCG capsule Take 8 mcg by mouth 2 (two)  times daily with a meal.    . metFORMIN (GLUCOPHAGE) 500 MG tablet Take 1 tablet (500 mg total) by mouth 2 (two) times daily with a meal. 360 tablet 1  . metoprolol tartrate (LOPRESSOR) 25 MG tablet Take 1 tablet (25 mg total) by mouth 2 (two) times daily. 180 tablet 1  . omeprazole (PRILOSEC) 40 MG capsule Take 40 mg by mouth daily.    . pravastatin (PRAVACHOL) 40 MG tablet Take 1 tablet (40 mg total) by mouth daily. 90 tablet 1  . valACYclovir (VALTREX) 1000 MG tablet 2g twice daily for 2 doses, start as soon as symptoms begin 8 tablet 0   No current facility-administered medications for this visit.    Allergies as of 08/10/2019 - Review Complete 08/10/2019  Allergen Reaction Noted  .  Sulfa antibiotics  06/20/2012  . Sulfa drugs cross reactors Rash 09/02/2011    ROS:  General: Negative for anorexia, weight loss, fever, chills, fatigue, weakness. ENT: Negative for hoarseness, difficulty swallowing , nasal congestion. CV: Negative for chest pain, angina, palpitations, dyspnea on exertion, peripheral edema.  Respiratory: Negative for dyspnea at rest, dyspnea on exertion, cough, sputum, wheezing.  GI: See history of present illness. GU:  Negative for dysuria, hematuria, urinary incontinence, urinary frequency, nocturnal urination.  Endo: Negative for unusual weight change.    Physical Examination:   BP (!) 170/65   Pulse (!) 59   Temp (!) 96.9 F (36.1 C) (Oral)   Ht 5\' 7"  (1.702 m)   Wt 142 lb 3.2 oz (64.5 kg)   BMI 22.27 kg/m   General: Well-nourished, well-developed in no acute distress.  Eyes: No icterus. Mouth: masked Lungs: Clear to auscultation bilaterally.  Heart: Regular rate and rhythm, no murmurs rubs or gallops.  Abdomen: Bowel sounds are normal, nontender, nondistended, no hepatosplenomegaly or masses, no abdominal bruits or hernia , no rebound or guarding.   Extremities: No lower extremity edema. No clubbing or deformities. Neuro: Alert and oriented x 4   Skin: Warm and dry, no jaundice.   Psych: Alert and cooperative, normal mood and affect.  Labs:  Lab Results  Component Value Date   CREATININE 0.86 05/19/2019   BUN 13 05/19/2019   NA 141 05/19/2019   K 4.5 05/19/2019   CL 101 05/19/2019   CO2 29 05/19/2019   Lab Results  Component Value Date   ALT 14 05/19/2019   AST 21 05/19/2019   ALKPHOS 75 05/19/2019   BILITOT 0.3 05/19/2019   Lab Results  Component Value Date   WBC 5.2 05/19/2019   HGB 11.5 05/19/2019   HCT 35.2 05/19/2019   MCV 96 05/19/2019   PLT 171 05/19/2019     Imaging Studies: No results found.

## 2019-08-10 NOTE — Assessment & Plan Note (Signed)
Her last office visit she stated she was unable to afford the Amitiza so we switched her to [place].  Today she tells me she continues to take Amitiza twice daily most days.  She is not sure if her bowel movements are adequate.  Complains of postprandial bloating.  Seems to be worse the last few weeks.  Samples of Amitiza 24 mcg twice daily provided for her to try.  Follow-up on upcoming labs.  We will touch base with her in about a week to see how she is feeling.  I discussed with her the possibility of CT abdomen pelvis with contrast for abdominal bloating in the near future but she is not quite ready to schedule.

## 2019-08-10 NOTE — Assessment & Plan Note (Signed)
See constipation assessment and plan.  Also provided her with FODMAP diet, avoid high FODMAP foods to see if this helps with bloating.  We will touch base with her next week and see how things are going with change in bowel regimen.

## 2019-08-10 NOTE — Assessment & Plan Note (Signed)
Has been difficult to manage.  Sleeping in a recliner.  Predominantly nighttime symptoms worse for her.  Regurgitation and burning throughout the night.  Typically eats 1/2 banana sandwich at 8 PM prior to bed.  She will take the last dose of omeprazole 30 minutes before that snack.  Also suggested switching out her snack to something different to see if this is contributing to her symptoms.  Take other omeprazole 30 minutes before breakfast.  Reinforced antireflux measures.  Diet handout provided as well.  She has not started AcipHex but did obtain prescription.  Continue omeprazole for now.  We will touch base with her next week or so and see how things are going.  Currently not interested in upper endoscopy although feels like she may for this if her symptoms continue to be problematic.

## 2019-08-10 NOTE — Progress Notes (Signed)
CC'ED TO PCP 

## 2019-08-11 ENCOUNTER — Other Ambulatory Visit: Payer: Self-pay

## 2019-08-11 ENCOUNTER — Ambulatory Visit (INDEPENDENT_AMBULATORY_CARE_PROVIDER_SITE_OTHER): Payer: Medicare Other | Admitting: Family Medicine

## 2019-08-11 ENCOUNTER — Encounter: Payer: Self-pay | Admitting: Family Medicine

## 2019-08-11 VITALS — BP 143/50 | HR 66 | Temp 98.0°F | Resp 20 | Ht 67.0 in | Wt 142.0 lb

## 2019-08-11 DIAGNOSIS — E1169 Type 2 diabetes mellitus with other specified complication: Secondary | ICD-10-CM | POA: Diagnosis not present

## 2019-08-11 DIAGNOSIS — Z794 Long term (current) use of insulin: Secondary | ICD-10-CM | POA: Diagnosis not present

## 2019-08-11 DIAGNOSIS — E1159 Type 2 diabetes mellitus with other circulatory complications: Secondary | ICD-10-CM

## 2019-08-11 DIAGNOSIS — E119 Type 2 diabetes mellitus without complications: Secondary | ICD-10-CM | POA: Diagnosis not present

## 2019-08-11 DIAGNOSIS — R110 Nausea: Secondary | ICD-10-CM

## 2019-08-11 DIAGNOSIS — E785 Hyperlipidemia, unspecified: Secondary | ICD-10-CM

## 2019-08-11 DIAGNOSIS — I10 Essential (primary) hypertension: Secondary | ICD-10-CM

## 2019-08-11 MED ORDER — BLOOD GLUCOSE METER KIT: PACK | 0 refills | Status: DC

## 2019-08-11 MED ORDER — ONDANSETRON HCL 4 MG PO TABS: 4.0000 mg | ORAL_TABLET | Freq: Three times a day (TID) | ORAL | 0 refills | Status: DC | PRN

## 2019-08-11 NOTE — Progress Notes (Signed)
Subjective:  Patient ID: Susan Petty, female    DOB: 1945-08-21, 74 y.o.   MRN: [id number]  Patient Care Team: Baruch Gouty, FNP as PCP - General (Family Medicine) Danie Binder, MD as Consulting Physician (Gastroenterology)   Chief Complaint:  Medical Management of Chronic Issues (3 mo), Diabetes, Hyperlipidemia, and Hypertension   HPI: Susan Petty is a 74 y.o. female presenting on 08/11/2019 for Medical Management of Chronic Issues (3 mo), Diabetes, Hyperlipidemia, and Hypertension   1. Type 2 diabetes mellitus with insulin therapy ([place]) Pt reports her blood sugars have been running high at times, states this morning it was 280. She denies polyuria, polyphagia, or polydipsia. No weakness, confusion, or diaphoresis. States she has been trying to watch diet but does snack a lot in the evenings. She is taking medications as prescribed.   2. Hypertension associated with type 2 diabetes mellitus ([place]) Compliant with medications. Has not taken her medications this morning. Denies chest pain, leg swelling, palpitations, weakness, dizziness, confusion, or headaches. No syncope.   3. Hyperlipidemia associated with type 2 diabetes mellitus ([place]) Compliant with statin therapy without associated side effects. Does try to watch diet and is active on a regular basis.   4. Nausea Pt reports ongoing nausea and abdominal bloating. She was seen by GI yesterday and her Amitiza was increased to 24 mcg twice daily. States she has not started this dose yet but is afraid it will make her nausea worse. She would like something for her nausea. No abdominal pain, vomiting, fever, chills, weakness, confusion, or fatigue with the nausea.      Relevant past medical, surgical, family, and social history reviewed and updated as indicated.  Allergies and medications reviewed and updated. Date reviewed: Chart in Epic.   Past Medical History:  Diagnosis Date  . Diabetes mellitus   . GERD  (gastroesophageal reflux disease)   . Hyperlipidemia   . Hypertension   . Osteoporosis     Past Surgical History:  Procedure Laterality Date  . COLONOSCOPY N/A 03/29/2016   Dr. Oneida Alar: Redundant left colon, nonbleeding internal hemorrhoids.  Consider colonoscopy in 10 to 15 years if benefits outweigh the risk   . ESOPHAGOGASTRODUODENOSCOPY N/A 03/29/2016   Dr. Oneida Alar: Small hiatal hernia, small sessile polyps found in the gastric fundus, benign fundic gland polyps, mild inflammation and erythema in the gastric antrum.  Duodenum was normal.  Biopsy negative for celiac.  Schatzki ring, patent.  Marland Kitchen TOTAL HIP ARTHROPLASTY  09/03/2011   Procedure: TOTAL HIP ARTHROPLASTY;  Surgeon: Mauri Pole, MD;  Location: [facility];  Service: Orthopedics;  Laterality: Left;    Social History   Socioeconomic History  . Marital status: Widowed    Spouse name: Not on file  . Number of children: 2  . Years of education: 53  . Highest education level: High school graduate  Occupational History  . Occupation: retired  Tobacco Use  . Smoking status: Never Smoker  . Smokeless tobacco: Never Used  Substance and Sexual Activity  . Alcohol use: Yes    Comment: occasional wine  . Drug use: No  . Sexual activity: Not Currently    Birth control/protection: None  Other Topics Concern  . Not on file  Social History Narrative  . Not on file   Social Determinants of Health   Financial Resource Strain: Low Risk   . Difficulty of Paying Living Expenses: Not hard at all  Food Insecurity: No Food Insecurity  .  Worried About Charity fundraiser in the Last Year: Never true  . Ran Out of Food in the Last Year: Never true  Transportation Needs: No Transportation Needs  . Lack of Transportation (Medical): No  . Lack of Transportation (Non-Medical): No  Physical Activity: Insufficiently Active  . Days of Exercise per Week: 2 days  . Minutes of Exercise per Session: 60 min  Stress: No Stress Concern Present  .  Feeling of Stress : Only a little  Social Connections: Slightly Isolated  . Frequency of Communication with Friends and Family: More than three times a week  . Frequency of Social Gatherings with Friends and Family: More than three times a week  . Attends Religious Services: More than 4 times per year  . Active Member of Clubs or Organizations: Yes  . Attends Archivist Meetings: More than 4 times per year  . Marital Status: Widowed  Intimate Partner Violence: Not At Risk  . Fear of Current or Ex-Partner: No  . Emotionally Abused: No  . Physically Abused: No  . Sexually Abused: No    Outpatient Encounter Medications as of 08/11/2019  Medication Sig  . alendronate (FOSAMAX) 70 MG tablet TAKE ONE TABLET BY MOUTH ONCE A WEEK WITH A FULL GLASS OF WATER ON AN EMPTY STOMACH  . amLODipine (NORVASC) 5 MG tablet Take 1 tablet (5 mg total) by mouth daily.  Marland Kitchen aspirin EC 81 MG tablet Take 81 mg by mouth daily.  Marland Kitchen azelastine (ASTELIN) 0.1 % nasal spray Place 1 spray into both nostrils 2 (two) times daily.  . benazepril (LOTENSIN) 40 MG tablet Take 1 tablet (40 mg total) by mouth daily.  . Cholecalciferol (VITAMIN D-3 PO) Take 2,000 Units by mouth daily.  . fluticasone (FLONASE) 50 MCG/ACT nasal spray Place 2 sprays into both nostrils daily.  Marland Kitchen glucose blood (ACCU-CHEK AVIVA PLUS) test strip USE TO CHECK GLUCOSE 6 TIMES DAILY Dx E11.9  . hydrochlorothiazide (HYDRODIURIL) 25 MG tablet Take 1 tablet (25 mg total) by mouth daily.  . insulin NPH Human (HUMULIN N) 100 UNIT/ML injection INJECT 30 UNITS SUBCUTANEOUSLY IN THE MORNING AND 5 UNITS IN THE EVENING  . insulin regular (NOVOLIN R) 100 units/mL injection Inject 0.03 mLs (3 Units total) into the skin 3 (three) times daily before meals. (Patient taking differently: Inject 3 Units into the skin 3 (three) times daily before meals. PRN)  . ketoconazole (NIZORAL) 2 % shampoo Apply to scalp and skin.  Leave on for 10 minutes and then rinse off every  4 days for 4 weeks.  Marland Kitchen lubiprostone (AMITIZA) 24 MCG capsule Take 24 mcg by mouth 2 (two) times daily with a meal.  . metFORMIN (GLUCOPHAGE) 500 MG tablet Take 1 tablet (500 mg total) by mouth 2 (two) times daily with a meal.  . metoprolol tartrate (LOPRESSOR) 25 MG tablet Take 1 tablet (25 mg total) by mouth 2 (two) times daily.  Marland Kitchen omeprazole (PRILOSEC) 40 MG capsule Take 40 mg by mouth 2 (two) times daily.   . pravastatin (PRAVACHOL) 40 MG tablet Take 1 tablet (40 mg total) by mouth daily.  . valACYclovir (VALTREX) 1000 MG tablet 2g twice daily for 2 doses, start as soon as symptoms begin  . ondansetron (ZOFRAN) 4 MG tablet Take 1 tablet (4 mg total) by mouth every 8 (eight) hours as needed for nausea or vomiting.  . [DISCONTINUED] lubiprostone (AMITIZA) 8 MCG capsule Take 8 mcg by mouth 2 (two) times daily with a meal.  No facility-administered encounter medications on file as of 08/11/2019.    Allergies  Allergen Reactions  . Sulfa Antibiotics   . Sulfa Drugs Cross Reactors Rash    Review of Systems  Constitutional: Negative for activity change, appetite change, chills, diaphoresis, fatigue, fever and unexpected weight change.  HENT: Negative.  Negative for congestion.   Eyes: Negative.  Negative for photophobia and visual disturbance.  Respiratory: Negative for cough, chest tightness and shortness of breath.   Cardiovascular: Negative for chest pain, palpitations and leg swelling.  Gastrointestinal: Positive for abdominal distention, constipation and nausea. Negative for abdominal pain, anal bleeding, blood in stool, diarrhea, rectal pain and vomiting.  Endocrine: Negative.  Negative for cold intolerance, heat intolerance, polydipsia, polyphagia and polyuria.  Genitourinary: Negative for decreased urine volume, difficulty urinating, dysuria, frequency and urgency.  Musculoskeletal: Negative for arthralgias and myalgias.  Skin: Negative.   Allergic/Immunologic: Negative.     Neurological: Negative for dizziness, tremors, seizures, syncope, facial asymmetry, speech difficulty, weakness, light-headedness, numbness and headaches.  Hematological: Negative.   Psychiatric/Behavioral: Negative for confusion, hallucinations, sleep disturbance and suicidal ideas.  All other systems reviewed and are negative.       Objective:  BP (!) 143/50 (BP Location: Right Arm, Cuff Size: Normal)   Pulse 66   Temp 98 F (36.7 C)   Resp 20   Ht '5\' 7"'$  (1.702 m)   Wt 142 lb (64.4 kg)   SpO2 99%   BMI 22.24 kg/m    Wt Readings from Last 3 Encounters:  08/11/19 142 lb (64.4 kg)  08/10/19 142 lb 3.2 oz (64.5 kg)  05/19/19 141 lb (64 kg)    Physical Exam Vitals and nursing note reviewed.  Constitutional:      General: She is not in acute distress.    Appearance: Normal appearance. She is well-developed and well-groomed. She is not ill-appearing, toxic-appearing or diaphoretic.  HENT:     Head: Normocephalic and atraumatic.     Jaw: There is normal jaw occlusion.     Right Ear: Hearing normal.     Left Ear: Hearing normal.     Nose: Nose normal.     Mouth/Throat:     Lips: Pink.     Mouth: Mucous membranes are moist.     Pharynx: Oropharynx is clear. Uvula midline.  Eyes:     General: Lids are normal.     Extraocular Movements: Extraocular movements intact.     Conjunctiva/sclera: Conjunctivae normal.     Pupils: Pupils are equal, round, and reactive to light.  Neck:     Thyroid: No thyroid mass, thyromegaly or thyroid tenderness.     Vascular: No carotid bruit or JVD.     Trachea: Trachea and phonation normal.  Cardiovascular:     Rate and Rhythm: Normal rate and regular rhythm.     Chest Wall: PMI is not displaced.     Pulses: Normal pulses.     Heart sounds: Normal heart sounds. No murmur. No friction rub. No gallop.   Pulmonary:     Effort: Pulmonary effort is normal. No respiratory distress.     Breath sounds: Normal breath sounds. No wheezing.   Abdominal:     General: Bowel sounds are normal. There is no distension or abdominal bruit.     Palpations: Abdomen is soft. There is no hepatomegaly or splenomegaly.     Tenderness: There is no abdominal tenderness. There is no right CVA tenderness or left CVA tenderness.     Hernia:  No hernia is present.  Musculoskeletal:        General: Normal range of motion.     Cervical back: Normal range of motion and neck supple.     Right lower leg: No edema.     Left lower leg: No edema.  Lymphadenopathy:     Cervical: No cervical adenopathy.  Skin:    General: Skin is warm and dry.     Capillary Refill: Capillary refill takes less than 2 seconds.     Coloration: Skin is not cyanotic, jaundiced or pale.     Findings: No rash.  Neurological:     General: No focal deficit present.     Mental Status: She is alert and oriented to person, place, and time.     Cranial Nerves: Cranial nerves are intact. No cranial nerve deficit.     Sensory: Sensation is intact. No sensory deficit.     Motor: Motor function is intact. No weakness.     Coordination: Coordination is intact. Coordination normal.     Gait: Gait is intact. Gait normal.     Deep Tendon Reflexes: Reflexes are normal and symmetric. Reflexes normal.  Psychiatric:        Attention and Perception: Attention and perception normal.        Mood and Affect: Mood and affect normal.        Speech: Speech normal.        Behavior: Behavior normal. Behavior is cooperative.        Thought Content: Thought content normal.        Cognition and Memory: Cognition and memory normal.        Judgment: Judgment normal.     Results for orders placed or performed in visit on 05/19/19  CBC with Differential/Platelet  Result Value Ref Range   WBC 5.2 3.4 - 10.8 x10E3/uL   RBC 3.68 (L) 3.77 - 5.28 x10E6/uL   Hemoglobin 11.5 11.1 - 15.9 g/dL   Hematocrit 35.2 34.0 - 46.6 %   MCV 96 79 - 97 fL   MCH 31.3 26.6 - 33.0 pg   MCHC 32.7 31.5 - 35.7 g/dL    RDW 13.3 11.7 - 15.4 %   Platelets 171 150 - 450 x10E3/uL   Neutrophils 41 Not Estab. %   Lymphs 45 Not Estab. %   Monocytes 8 Not Estab. %   Eos 4 Not Estab. %   Basos 2 Not Estab. %   Neutrophils Absolute 2.1 1.4 - 7.0 x10E3/uL   Lymphocytes Absolute 2.3 0.7 - 3.1 x10E3/uL   Monocytes Absolute 0.4 0.1 - 0.9 x10E3/uL   EOS (ABSOLUTE) 0.2 0.0 - 0.4 x10E3/uL   Basophils Absolute 0.1 0.0 - 0.2 x10E3/uL   Immature Granulocytes 0 Not Estab. %   Immature Grans (Abs) 0.0 0.0 - 0.1 x10E3/uL  CMP14+EGFR  Result Value Ref Range   Glucose 141 (H) 65 - 99 mg/dL   BUN 13 8 - 27 mg/dL   Creatinine, Ser 0.86 0.57 - 1.00 mg/dL   GFR calc non Af Amer 66 >59 mL/min/1.73   GFR calc Af Amer 76 >59 mL/min/1.73   BUN/Creatinine Ratio 15 12 - 28   Sodium 141 134 - 144 mmol/L   Potassium 4.5 3.5 - 5.2 mmol/L   Chloride 101 96 - 106 mmol/L   CO2 29 20 - 29 mmol/L   Calcium 9.5 8.7 - 10.3 mg/dL   Total Protein 6.4 6.0 - 8.5 g/dL   Albumin 4.2 3.7 - 4.7 g/dL  Globulin, Total 2.2 1.5 - 4.5 g/dL   Albumin/Globulin Ratio 1.9 1.2 - 2.2   Bilirubin Total 0.3 0.0 - 1.2 mg/dL   Alkaline Phosphatase 75 39 - 117 IU/L   AST 21 0 - 40 IU/L   ALT 14 0 - 32 IU/L  Lipid panel  Result Value Ref Range   Cholesterol, Total 143 100 - 199 mg/dL   Triglycerides 66 0 - 149 mg/dL   HDL 74 >39 mg/dL   VLDL Cholesterol Cal 13 5 - 40 mg/dL   LDL Chol Calc (NIH) 56 0 - 99 mg/dL   Chol/HDL Ratio 1.9 0.0 - 4.4 ratio  Bayer DCA Hb A1c Waived  Result Value Ref Range   HB A1C (BAYER DCA - WAIVED) 7.9 (H) <7.0 %       Pertinent labs & imaging results that were available during my care of the patient were reviewed by me and considered in my medical decision making.  Assessment & Plan:  Sabriel was seen today for medical management of chronic issues, diabetes, hyperlipidemia and hypertension.  Diagnoses and all orders for this visit:  Type 2 diabetes mellitus with insulin therapy ([place]) Diet and exercise encouraged.  Labs pending, will adjust therapy if warranted. Pt reminded to have yearly eye exam completed.  -     CMP14+EGFR; Future -     Microalbumin / creatinine urine ratio; Future -     Bayer DCA Hb A1c Waived; Future  Hypertension associated with type 2 diabetes mellitus ([facility]) BP slightly elevated in office today. Pt has not taken her medications this morning. She will once home. DASH diet and exercise encouraged. Labs pending.  -     CMP14+EGFR; Future -     CBC with Differential/Platelet; Future -     Microalbumin / creatinine urine ratio; Future  Hyperlipidemia associated with type 2 diabetes mellitus ([place]) Diet encouraged - increase intake of fresh fruits and vegetables, increase intake of lean proteins. Bake, broil, or grill foods. Avoid fried, greasy, and fatty foods. Avoid fast foods. Increase intake of fiber-rich whole grains. Exercise encouraged - at least 150 minutes per week and advance as tolerated.  Goal BMI < 25. Continue medications as prescribed. Follow up in 3-6 months as discussed.  -     CMP14+EGFR; Future  Nausea Pt aware to eat small frequent meals and to avoid spicy, heavy, or greasy meals. Will provide below as needed for nausea. Report any new, worsening, or persistent symptoms.  -     ondansetron (ZOFRAN) 4 MG tablet; Take 1 tablet (4 mg total) by mouth every 8 (eight) hours as needed for nausea or vomiting.     Continue all other maintenance medications.  Follow up plan: Return in about 3 months (around 11/08/2019), or if symptoms worsen or fail to improve, for DM.  Continue healthy lifestyle choices, including diet (rich in fruits, vegetables, and lean proteins, and low in salt and simple carbohydrates) and exercise (at least 30 minutes of moderate physical activity daily).  Educational handout given for DM  The above assessment and management plan was discussed with the patient. The patient verbalized understanding of and has agreed to the management plan. Patient  is aware to call the clinic if they develop any new symptoms or if symptoms persist or worsen. Patient is aware when to return to the clinic for a follow-up visit. Patient educated on when it is appropriate to go to the emergency department.   Monia Pouch, FNP-C [facility]  [phone]

## 2019-08-11 NOTE — Patient Instructions (Addendum)
Melatonin 6-10 mg at night to help sleep. Amitiza 24 mcg twice daily.   Continue to monitor your blood sugars as we discussed and record them. Bring the log to your next appointment.  Take your medications as directed.    Goal Blood glucose:    Fasting (before meals) = 80 to 130   Within 2 hours of eating = less than 180   Understanding your Hemoglobin A1c:     Diabetes Mellitus and Nutrition    I think that you would greatly benefit from seeing a nutritionist. If this is something you are interested in, please call Dr Jenne Campus at [phone] to schedule an appointment.   When you have diabetes (diabetes mellitus), it is very important to have healthy eating habits because your blood sugar (glucose) levels are greatly affected by what you eat and drink. Eating healthy foods in the appropriate amounts, at about the same times every day, can help you:  Control your blood glucose.  Lower your risk of heart disease.  Improve your blood pressure.  Reach or maintain a healthy weight.  Every person with diabetes is different, and each person has different needs for a meal plan. Your health care provider may recommend that you work with a diet and nutrition specialist (dietitian) to make a meal plan that is best for you. Your meal plan may vary depending on factors such as:  The calories you need.  The medicines you take.  Your weight.  Your blood glucose, blood pressure, and cholesterol levels.  Your activity level.  Other health conditions you have, such as heart or kidney disease.  How do carbohydrates affect me? Carbohydrates affect your blood glucose level more than any other type of food. Eating carbohydrates naturally increases the amount of glucose in your blood. Carbohydrate counting is a method for keeping track of how many carbohydrates you eat. Counting carbohydrates is important to keep your blood glucose at a healthy level, especially if you use insulin or take  certain oral diabetes medicines. It is important to know how many carbohydrates you can safely have in each meal. This is different for every person. Your dietitian can help you calculate how many carbohydrates you should have at each meal and for snack. Foods that contain carbohydrates include:  Bread, cereal, rice, pasta, and crackers.  Potatoes and corn.  Peas, beans, and lentils.  Milk and yogurt.  Fruit and juice.  Desserts, such as cakes, cookies, ice cream, and candy.  How does alcohol affect me? Alcohol can cause a sudden decrease in blood glucose (hypoglycemia), especially if you use insulin or take certain oral diabetes medicines. Hypoglycemia can be a life-threatening condition. Symptoms of hypoglycemia (sleepiness, dizziness, and confusion) are similar to symptoms of having too much alcohol. If your health care provider says that alcohol is safe for you, follow these guidelines:  Limit alcohol intake to no more than 1 drink per day for nonpregnant women and 2 drinks per day for men. One drink equals 12 oz of beer, 5 oz of wine, or 1 oz of hard liquor.  Do not drink on an empty stomach.  Keep yourself hydrated with water, diet soda, or unsweetened iced tea.  Keep in mind that regular soda, juice, and other mixers may contain a lot of sugar and must be counted as carbohydrates.  What are tips for following this plan?  Reading food labels  Start by checking the serving size on the label. The amount of calories, carbohydrates, fats, and other  nutrients listed on the label are based on one serving of the food. Many foods contain more than one serving per package.  Check the total grams (g) of carbohydrates in one serving. You can calculate the number of servings of carbohydrates in one serving by dividing the total carbohydrates by 15. For example, if a food has 30 g of total carbohydrates, it would be equal to 2 servings of carbohydrates.  Check the number of grams (g) of  saturated and trans fats in one serving. Choose foods that have low or no amount of these fats.  Check the number of milligrams (mg) of sodium in one serving. Most people should limit total sodium intake to less than 2,300 mg per day.  Always check the nutrition information of foods labeled as "low-fat" or "nonfat". These foods may be higher in added sugar or refined carbohydrates and should be avoided.  Talk to your dietitian to identify your daily goals for nutrients listed on the label.  Shopping  Avoid buying canned, premade, or processed foods. These foods tend to be high in fat, sodium, and added sugar.  Shop around the outside edge of the grocery store. This includes fresh fruits and vegetables, bulk grains, fresh meats, and fresh dairy.  Cooking  Use low-heat cooking methods, such as baking, instead of high-heat cooking methods like deep frying.  Cook using healthy oils, such as olive, canola, or sunflower oil.  Avoid cooking with butter, cream, or high-fat meats.  Meal planning  Eat meals and snacks regularly, preferably at the same times every day. Avoid going long periods of time without eating.  Eat foods high in fiber, such as fresh fruits, vegetables, beans, and whole grains. Talk to your dietitian about how many servings of carbohydrates you can eat at each meal.  Eat 4-6 ounces of lean protein each day, such as lean meat, chicken, fish, eggs, or tofu. 1 ounce is equal to 1 ounce of meat, chicken, or fish, 1 egg, or 1/4 cup of tofu.  Eat some foods each day that contain healthy fats, such as avocado, nuts, seeds, and fish.  Lifestyle   Check your blood glucose regularly.  Exercise at least 30 minutes 5 or more days each week, or as told by your health care provider.  Take medicines as told by your health care provider.  Do not use any products that contain nicotine or tobacco, such as cigarettes and e-cigarettes. If you need help quitting, ask your health care  provider.  Work with a Social worker or diabetes educator to identify strategies to manage stress and any emotional and social challenges.   What are some questions to ask my health care provider?  Do I need to meet with a diabetes educator?  Do I need to meet with a dietitian?  What number can I call if I have questions?  When are the best times to check my blood glucose?   Where to find more information:  American Diabetes Association: diabetes.org/food-and-fitness/food  Academy of Nutrition and Dietetics: PokerClues.dk  [employer] of Diabetes and Digestive and Kidney Diseases (NIH): ContactWire.be   Summary  A healthy meal plan will help you control your blood glucose and maintain a healthy lifestyle.  Working with a diet and nutrition specialist (dietitian) can help you make a meal plan that is best for you.  Keep in mind that carbohydrates and alcohol have immediate effects on your blood glucose levels. It is important to count carbohydrates and to use alcohol carefully. This  information is not intended to replace advice given to you by your health care provider. Make sure you discuss any questions you have with your health care provider. Document Released: 03/21/2005 Document Revised: 07/29/2016 Document Reviewed: 07/29/2016 Elsevier Interactive Patient Education  [employer].

## 2019-08-20 ENCOUNTER — Other Ambulatory Visit: Payer: Medicare Other

## 2019-08-20 ENCOUNTER — Other Ambulatory Visit: Payer: Self-pay

## 2019-08-20 DIAGNOSIS — I10 Essential (primary) hypertension: Secondary | ICD-10-CM | POA: Diagnosis not present

## 2019-08-20 DIAGNOSIS — E785 Hyperlipidemia, unspecified: Secondary | ICD-10-CM

## 2019-08-20 DIAGNOSIS — E119 Type 2 diabetes mellitus without complications: Secondary | ICD-10-CM

## 2019-08-20 DIAGNOSIS — E1159 Type 2 diabetes mellitus with other circulatory complications: Secondary | ICD-10-CM

## 2019-08-20 DIAGNOSIS — Z794 Long term (current) use of insulin: Secondary | ICD-10-CM

## 2019-08-20 DIAGNOSIS — E1169 Type 2 diabetes mellitus with other specified complication: Secondary | ICD-10-CM | POA: Diagnosis not present

## 2019-08-20 LAB — CBC WITH DIFFERENTIAL/PLATELET
Basophils Absolute: 0.2 10*3/uL (ref 0.0–0.2)
Basos: 3 %
EOS (ABSOLUTE): 0.2 10*3/uL (ref 0.0–0.4)
Eos: 3 %
Hematocrit: 36.4 % (ref 34.0–46.6)
Hemoglobin: 12.6 g/dL (ref 11.1–15.9)
Immature Grans (Abs): 0 10*3/uL (ref 0.0–0.1)
Immature Granulocytes: 0 %
Lymphocytes Absolute: 2 10*3/uL (ref 0.7–3.1)
Lymphs: 34 %
MCH: 32 pg (ref 26.6–33.0)
MCHC: 34.6 g/dL (ref 31.5–35.7)
MCV: 92 fL (ref 79–97)
Monocytes Absolute: 0.5 10*3/uL (ref 0.1–0.9)
Monocytes: 9 %
Neutrophils Absolute: 3.1 10*3/uL (ref 1.4–7.0)
Neutrophils: 51 %
Platelets: 224 10*3/uL (ref 150–450)
RBC: 3.94 x10E6/uL (ref 3.77–5.28)
RDW: 12.2 % (ref 11.7–15.4)
WBC: 6 10*3/uL (ref 3.4–10.8)

## 2019-08-20 LAB — CMP14+EGFR
ALT: 12 IU/L (ref 0–32)
AST: 21 IU/L (ref 0–40)
Albumin/Globulin Ratio: 1.8 (ref 1.2–2.2)
Albumin: 4.3 g/dL (ref 3.7–4.7)
Alkaline Phosphatase: 88 IU/L (ref 39–117)
BUN/Creatinine Ratio: 15 (ref 12–28)
BUN: 12 mg/dL (ref 8–27)
Bilirubin Total: <0.2 mg/dL (ref 0.0–1.2)
CO2: 24 mmol/L (ref 20–29)
Calcium: 10.1 mg/dL (ref 8.7–10.3)
Chloride: 101 mmol/L (ref 96–106)
Creatinine, Ser: 0.79 mg/dL (ref 0.57–1.00)
GFR calc Af Amer: 85 mL/min/{1.73_m2} (ref >59)
GFR calc non Af Amer: 73 mL/min/{1.73_m2} (ref >59)
Globulin, Total: 2.4 g/dL (ref 1.5–4.5)
Glucose: 190 mg/dL — ABNORMAL HIGH (ref 65–99)
Potassium: 4.5 mmol/L (ref 3.5–5.2)
Sodium: 140 mmol/L (ref 134–144)
Total Protein: 6.7 g/dL (ref 6.0–8.5)

## 2019-08-20 LAB — BAYER DCA HB A1C WAIVED: HB A1C (BAYER DCA - WAIVED): 7.9 % — ABNORMAL HIGH (ref <7.0)

## 2019-08-21 LAB — MICROALBUMIN / CREATININE URINE RATIO
Creatinine, Urine: 69.6 mg/dL
Microalb/Creat Ratio: 21 mg/g creat (ref 0–29)
Microalbumin, Urine: 14.8 ug/mL

## 2019-08-24 ENCOUNTER — Ambulatory Visit: Payer: Medicare Other | Admitting: Family Medicine

## 2019-09-20 ENCOUNTER — Ambulatory Visit (INDEPENDENT_AMBULATORY_CARE_PROVIDER_SITE_OTHER): Payer: Medicare Other | Admitting: Family

## 2019-09-20 ENCOUNTER — Encounter: Payer: Self-pay | Admitting: Family

## 2019-09-20 DIAGNOSIS — L0291 Cutaneous abscess, unspecified: Secondary | ICD-10-CM | POA: Diagnosis not present

## 2019-09-20 DIAGNOSIS — B373 Candidiasis of vulva and vagina: Secondary | ICD-10-CM | POA: Diagnosis not present

## 2019-09-20 DIAGNOSIS — B3731 Acute candidiasis of vulva and vagina: Secondary | ICD-10-CM

## 2019-09-20 MED ORDER — AMOXICILLIN-POT CLAVULANATE 875-125 MG PO TABS: 1.0000 | ORAL_TABLET | Freq: Two times a day (BID) | ORAL | 0 refills | Status: DC

## 2019-09-20 MED ORDER — FLUCONAZOLE 150 MG PO TABS: 150.0000 mg | ORAL_TABLET | ORAL | 0 refills | Status: DC | PRN

## 2019-09-20 NOTE — Progress Notes (Signed)
   Virtual Visit via telephone Note Due to COVID-19 pandemic this visit was conducted virtually. This visit type was conducted due to national recommendations for restrictions regarding the COVID-19 Pandemic (e.g. social distancing, sheltering in place) in an effort to limit this patient's exposure and mitigate transmission in our community. All issues noted in this document were discussed and addressed.  A physical exam was not performed with this format.  I connected with Susan Petty on 09/20/19 at 12:20 pm  by telephone and verified that I am speaking with the correct person using two identifiers. Susan Petty is currently located at home and no one is currently with her during visit. The provider, Evelina Dun, FNP is located in their office at time of visit.  I discussed the limitations, risks, security and privacy concerns of performing an evaluation and management service by telephone and the availability of in person appointments. I also discussed with the patient that there may be a patient responsible charge related to this service. The patient expressed understanding and agreed to proceed.   History and Present Illness:  Pt calls the office today with an abscess on her left labia that started a week ago. She has been apply heat with mild relief. She states she has had this before and it has "come to a head and popped" and felt better. She states she is also having some vaginal itching.  Vaginal Itching The patient's primary symptoms include genital itching. The patient's pertinent negatives include no genital odor, genital rash, vaginal bleeding or vaginal discharge. The current episode started in the past 7 days. The problem occurs intermittently. The problem has been waxing and waning. The pain is mild.      Review of Systems  Genitourinary: Negative for vaginal discharge.     Observations/Objective: No SOB or distress noted  Assessment and Plan: 1. Abscess -  amoxicillin-clavulanate (AUGMENTIN) 875-125 MG tablet; Take 1 tablet by mouth 2 (two) times daily.  Dispense: 14 tablet; Refill: 0  2. Vagina, candidiasis - fluconazole (DIFLUCAN) 150 MG tablet; Take 1 tablet (150 mg total) by mouth every three (3) days as needed.  Dispense: 3 tablet; Refill: 0  Continue warm compresses No not pick or squeeze Call office if area becomes worse or does not improve   I discussed the assessment and treatment plan with the patient. The patient was provided an opportunity to ask questions and all were answered. The patient agreed with the plan and demonstrated an understanding of the instructions.   The patient was advised to call back or seek an in-person evaluation if the symptoms worsen or if the condition fails to improve as anticipated.  The above assessment and management plan was discussed with the patient. The patient verbalized understanding of and has agreed to the management plan. Patient is aware to call the clinic if symptoms persist or worsen. Patient is aware when to return to the clinic for a follow-up visit. Patient educated on when it is appropriate to go to the emergency department.   Time call ended:  12:31 pm  I provided 11 minutes of non-face-to-face time during this encounter.    Evelina Dun, FNP

## 2019-10-12 NOTE — Progress Notes (Signed)
REVIEWED-NO ADDITIONAL RECOMMENDATIONS. 

## 2019-10-27 ENCOUNTER — Ambulatory Visit (INDEPENDENT_AMBULATORY_CARE_PROVIDER_SITE_OTHER): Payer: Medicare Other | Admitting: Nurse Practitioner

## 2019-10-27 ENCOUNTER — Encounter: Payer: Self-pay | Admitting: Nurse Practitioner

## 2019-10-27 DIAGNOSIS — R110 Nausea: Secondary | ICD-10-CM

## 2019-10-27 DIAGNOSIS — K219 Gastro-esophageal reflux disease without esophagitis: Secondary | ICD-10-CM | POA: Diagnosis not present

## 2019-10-27 DIAGNOSIS — K5901 Slow transit constipation: Secondary | ICD-10-CM

## 2019-10-27 MED ORDER — LINACLOTIDE 145 MCG PO CAPS: 145.0000 ug | ORAL_CAPSULE | Freq: Every day | ORAL | 1 refills | Status: DC

## 2019-10-27 MED ORDER — PANTOPRAZOLE SODIUM 40 MG PO TBEC: 40.0000 mg | DELAYED_RELEASE_TABLET | Freq: Every day | ORAL | 3 refills | Status: DC

## 2019-10-27 NOTE — Progress Notes (Signed)
Virtual Visit via telephone Note Due to COVID-19 pandemic this visit was conducted virtually. This visit type was conducted due to national recommendations for restrictions regarding the COVID-19 Pandemic (e.g. social distancing, sheltering in place) in an effort to limit this patient's exposure and mitigate transmission in our community. All issues noted in this document were discussed and addressed.  A physical exam was not performed with this format.  I connected with Susan Petty on 10/27/19 at 10:15 by telephone and verified that I am speaking with the correct person using two identifiers. Susan Petty is currently located at home and no one is currently with her during visit. The provider, Mary-Margaret Hassell Done, FNP is located in their office at time of visit.  I discussed the limitations, risks, security and privacy concerns of performing an evaluation and management service by telephone and the availability of in person appointments. I also discussed with the patient that there may be a patient responsible charge related to this service. The patient expressed understanding and agreed to proceed.   History and Present Illness:   Chief Complaint: Nausea   HPI Patient calls in today stating she has had an upset stomach with gassiness for over 2 weeks. Has poor appetite. She does try to eat 3 meals a day because she is a diabetic. She has not been out of house or exposed to covid. She deneis fever and vomiting. No abdominal pain. She has  tried pepto bismol and zofran and no change in symptoms. She thinks it is coming from her constipation. She also says her GERD has really been acting up   Review of Systems  Constitutional: Negative.  Negative for chills and fever.  Respiratory: Negative.   Cardiovascular: Negative.   Gastrointestinal: Positive for nausea. Negative for abdominal pain, constipation, diarrhea and vomiting.  Genitourinary: Negative.   Psychiatric/Behavioral:  Negative.   All other systems reviewed and are negative.    Observations/Objective: Alert and oriented- answers all questions appropriately No distress    Assessment and Plan: Alert and oriented- answers all questions appropriately No distress    Follow Up Instructions: Susan Petty in today with chief complaint of Nausea   1. Nausea Bland diet Avoid spicy and fatty foods  2. Slow transit constipation Milk of magnesia and 6oz prune jiuce Add linzess to daily meds Increase fiber in diet - linaclotide (LINZESS) 145 MCG CAPS capsule; Take 1 capsule (145 mcg total) by mouth daily before breakfast.  Dispense: 30 capsule; Refill: 1  3. Gastroesophageal reflux disease without esophagitis Stop omeprazole  Changed to protonix - pantoprazole (PROTONIX) 40 MG tablet; Take 1 tablet (40 mg total) by mouth daily.  Dispense: 30 tablet; Refill: 3    Follow up prn    I discussed the assessment and treatment plan with the patient. The patient was provided an opportunity to ask questions and all were answered. The patient agreed with the plan and demonstrated an understanding of the instructions.   The patient was advised to call back or seek an in-person evaluation if the symptoms worsen or if the condition fails to improve as anticipated.  The above assessment and management plan was discussed with the patient. The patient verbalized understanding of and has agreed to the management plan. Patient is aware to call the clinic if symptoms persist or worsen. Patient is aware when to return to the clinic for a follow-up visit. Patient educated on when it is appropriate to go to the emergency department.   Time  call ended:  10:27  I provided 12 minutes of non-face-to-face time during this encounter.    Mary-Margaret Hassell Done, FNP

## 2019-11-05 ENCOUNTER — Other Ambulatory Visit: Payer: Self-pay

## 2019-11-05 ENCOUNTER — Ambulatory Visit: Payer: Medicare Other | Admitting: Gastroenterology

## 2019-11-05 ENCOUNTER — Encounter: Payer: Self-pay | Admitting: Gastroenterology

## 2019-11-05 ENCOUNTER — Ambulatory Visit (HOSPITAL_COMMUNITY)
Admission: RE | Admit: 2019-11-05 | Discharge: 2019-11-05 | Disposition: A | Discharge status: Home / Self Care | Payer: Medicare Other | Source: Ambulatory Visit | Attending: Gastroenterology | Admitting: Gastroenterology

## 2019-11-05 VITALS — BP 162/60 | HR 67 | Temp 97.3°F | Ht 65.0 in | Wt 140.2 lb

## 2019-11-05 DIAGNOSIS — K219 Gastro-esophageal reflux disease without esophagitis: Secondary | ICD-10-CM

## 2019-11-05 DIAGNOSIS — K5900 Constipation, unspecified: Secondary | ICD-10-CM

## 2019-11-05 DIAGNOSIS — R140 Abdominal distension (gaseous): Secondary | ICD-10-CM | POA: Insufficient documentation

## 2019-11-05 MED ORDER — CLENPIQ 10-3.5-12 MG-GM -GM/160ML PO SOLN: ORAL | 0 refills | Status: DC

## 2019-11-05 NOTE — Assessment & Plan Note (Signed)
Continues to have significant bloating.  Associated with discomfort.  Does not like to eat because she feels like it makes her symptoms worse.  She believes all of this is related to constipation.  Having hard stools, incomplete bowel movements.  Never feels relieved.  Some days without any bowel movements.  Currently on probiotic, Metamucil, Linzess 45 mcg daily with no significant improvement.  Previously was on Amitiza 8 mcg twice daily without relief.  We increased her to 24 mcg twice daily and temporarily she felt better with but quickly lost effect.  We will obtain baseline abdominal x-ray to assess for stool load.  If unremarkable and no improvement with increased bowel regimen, next step would be CT abdomen pelvis with contrast.  Patient inquired about updating colonoscopy.  This is not unreasonable given change in bowel habits, last procedure 3-1/2 years ago, but would also be concerned about other potential intra-abdominal abnormalities contributing to her bloating and bowel changes.  Further recommendations to follow once abdominal x-ray obtained and we see how she does with change in bowel regimen.  She complains abdominal x-ray.  She will take Clenpiq for bowel prep.  Then she will start Linzess 290 mcg daily, we provided 10 days of samples. She will continue Metamucil and probiotic.

## 2019-11-05 NOTE — Progress Notes (Signed)
Primary Care Physician: Baruch Gouty, FNP  Primary Gastroenterologist:  Barney Drain, MD   Chief Complaint  Patient presents with  . Constipation    straining with BM, does not feel "cleaned out" for weeks, have bad gas, very little stool when she does have BM    HPI: Susan Petty is a 74 y.o. female here for follow-up.  She was last seen in February 2021 for GERD and constipation.  EGD and colonoscopy September 2017. She had a patent Schatzki ring, small hiatal hernia, small gastric polyp, biopsy proven fundic gland, mild gastritis, normal small bowel biopsies. Redundant left colon, nonbleeding internal hemorrhoids. Consider colonoscopy in 10 to 15 years if benefits outweigh the risk.  At her last office visit she was having some nocturnal regurgitation and reflux.  Having to sleep in a recliner.  Taking omeprazole 40 mg after breakfast and after her 4 PM meal.  Was eating a half of a banana sandwich at 8 PM prior to bed to help "regulate sugars".  I offered her an upper endoscopy but she was not interested at the time.  We requested she take omeprazole before her breakfast and before her 8 PM snack as well as switching out her 8 PM snack to something different to see if it would help.  She never tried the AcipHex that we previously prescribed because she had a stockpile of omeprazole at home.  For constipation she felt like her stools were incomplete and she was having bloating.  We increased her Amitiza to 24 mcg twice daily, providing samples.Labs in February showed A1c of 7.9, LFTs normal, creatinine normal, CBC normal.  Since she was last seen she was treated for labial abscess with augmentin (09/20/19). She also saw PCP virtually for gerd,constipation and was switched to pantoprazole 62m daily and Linzess 1479m daily. She is also taking metamucil.  Patient tells me she never started pantoprazole that her PCP gave her.  She still on omeprazole 40 mg before breakfast and  before her 8 PM meal.  Reflux has been better controlled.  Predominant complaints are bloating and constipation.  Started probiotic last week.  Started Metamucil daily.  Been on Linzess 145 mcg for 10 days and no good results.  Still passing hard stool.  Does not feel like good evacuation.  No relief in her bloating.  Sunday she has no stool.  Stomach roars like going to have BM but nothing comes. Feels bloating. Affected appetite. Doesn't feel like can eat. No melena, brbpr.     Current Outpatient Medications  Medication Sig Dispense Refill  . alendronate (FOSAMAX) 70 MG tablet TAKE ONE TABLET BY MOUTH ONCE A WEEK WITH A FULL GLASS OF WATER ON AN EMPTY STOMACH 4 tablet 2  . amLODipine (NORVASC) 5 MG tablet Take 1 tablet (5 mg total) by mouth daily. 90 tablet 1  . aspirin EC 81 MG tablet Take 81 mg by mouth daily.    . Marland Kitchenzelastine (ASTELIN) 0.1 % nasal spray Place 1 spray into both nostrils 2 (two) times daily. 30 mL 12  . benazepril (LOTENSIN) 40 MG tablet Take 1 tablet (40 mg total) by mouth daily. 90 tablet 1  . blood glucose meter kit and supplies Dispense based on patient and insurance preference. Use up to four times daily as directed. (FOR ICD-10 E10.9, E11.9). 1 each 0  . Cholecalciferol (VITAMIN D-3 PO) Take 2,000 Units by mouth daily.    . fluticasone (FLONASE) 50 MCG/ACT nasal spray  Place 2 sprays into both nostrils daily. 16 g 6  . glucose blood (ACCU-CHEK AVIVA PLUS) test strip USE TO CHECK GLUCOSE 6 TIMES DAILY Dx E11.9 600 each 3  . hydrochlorothiazide (HYDRODIURIL) 25 MG tablet Take 1 tablet (25 mg total) by mouth daily. 90 tablet 1  . insulin NPH Human (HUMULIN N) 100 UNIT/ML injection INJECT 30 UNITS SUBCUTANEOUSLY IN THE MORNING AND 5 UNITS IN THE EVENING 30 mL 3  . insulin regular (NOVOLIN R) 100 units/mL injection Inject 0.03 mLs (3 Units total) into the skin 3 (three) times daily before meals. (Patient taking differently: Inject 3 Units into the skin 3 (three) times daily before  meals. PRN) 7.5 mL 1  . ketoconazole (NIZORAL) 2 % shampoo Apply to scalp and skin.  Leave on for 10 minutes and then rinse off every 4 days for 4 weeks. 120 mL 0  . linaclotide (LINZESS) 145 MCG CAPS capsule Take 1 capsule (145 mcg total) by mouth daily before breakfast. 30 capsule 1  . metFORMIN (GLUCOPHAGE) 500 MG tablet Take 1 tablet (500 mg total) by mouth 2 (two) times daily with a meal. 360 tablet 1  . metoprolol tartrate (LOPRESSOR) 25 MG tablet Take 1 tablet (25 mg total) by mouth 2 (two) times daily. 180 tablet 1  . omeprazole (PRILOSEC) 40 MG capsule Take 40 mg by mouth 2 (two) times daily before a meal.    . ondansetron (ZOFRAN) 4 MG tablet Take 1 tablet (4 mg total) by mouth every 8 (eight) hours as needed for nausea or vomiting. 20 tablet 0  . pravastatin (PRAVACHOL) 40 MG tablet Take 1 tablet (40 mg total) by mouth daily. 90 tablet 1  . Probiotic Product (PROBIOTIC DAILY PO) Take by mouth daily.    . psyllium (METAMUCIL) 58.6 % powder Take 1 packet by mouth daily.    . valACYclovir (VALTREX) 1000 MG tablet 2g twice daily for 2 doses, start as soon as symptoms begin 8 tablet 0  .     0   No current facility-administered medications for this visit.    Allergies as of 11/05/2019 - Review Complete 11/05/2019  Allergen Reaction Noted  . Sulfa antibiotics  06/20/2012  . Sulfa drugs cross reactors Rash 09/02/2011    ROS:  General: Negative for  weight loss, fever, chills, fatigue, weakness.  See HPI ENT: Negative for hoarseness, difficulty swallowing , nasal congestion. CV: Negative for chest pain, angina, palpitations, dyspnea on exertion, peripheral edema.  Respiratory: Negative for dyspnea at rest, dyspnea on exertion, cough, sputum, wheezing.  GI: See history of present illness. GU:  Negative for dysuria, hematuria, urinary incontinence, urinary frequency, nocturnal urination.  Endo: Negative for unusual weight change.    Physical Examination:   BP (!) 162/60   Pulse 67    Temp (!) 97.3 F (36.3 C) (Oral)   Ht '5\' 5"'  (1.651 m)   Wt 140 lb 3.2 oz (63.6 kg)   BMI 23.33 kg/m   General: Well-nourished, well-developed in no acute distress.  Eyes: No icterus. Mouth: masked Lungs: Clear to auscultation bilaterally.  Heart: Regular rate and rhythm, no murmurs rubs or gallops.  Abdomen: Bowel sounds are normal, nontender, nondistended, no hepatosplenomegaly or masses, no abdominal bruits or hernia , no rebound or guarding.   Extremities: No lower extremity edema. No clubbing or deformities. Neuro: Alert and oriented x 4   Skin: Warm and dry, no jaundice.   Psych: Alert and cooperative, normal mood and affect.  Lab Results  Component  Value Date   CREATININE 0.79 08/20/2019   BUN 12 08/20/2019   NA 140 08/20/2019   K 4.5 08/20/2019   CL 101 08/20/2019   CO2 24 08/20/2019   Lab Results  Component Value Date   ALT 12 08/20/2019   AST 21 08/20/2019   ALKPHOS 88 08/20/2019   BILITOT <0.2 08/20/2019   Lab Results  Component Value Date   WBC 6.0 08/20/2019   HGB 12.6 08/20/2019   HCT 36.4 08/20/2019   MCV 92 08/20/2019   PLT 224 08/20/2019    Imaging Studies: No results found.

## 2019-11-05 NOTE — Patient Instructions (Addendum)
1. Use Clenpiq kit for colon purge. If expensive, please don't buy and call the office.  2. Stop current Linzess (150mg). Start the samples provided by our office (Linzess 2960m) taking one daily.  3. Go for abdominal xray BEFORE YOU TAKE THE COLON PURGE. We will call with results as available.  4. Based on your response to change in medication and xray findings, we will decide about possible CT scan of your abdomen versus a colonoscopy.

## 2019-11-05 NOTE — Assessment & Plan Note (Signed)
Seems to be better controlled at this time.  She will continue current regimen, omeprazole 40 mg before breakfast and before a p.m. snack.

## 2019-11-12 ENCOUNTER — Telehealth: Payer: Self-pay | Admitting: Gastroenterology

## 2019-11-12 DIAGNOSIS — I10 Essential (primary) hypertension: Secondary | ICD-10-CM | POA: Diagnosis not present

## 2019-11-12 DIAGNOSIS — Z794 Long term (current) use of insulin: Secondary | ICD-10-CM | POA: Diagnosis not present

## 2019-11-12 DIAGNOSIS — R140 Abdominal distension (gaseous): Secondary | ICD-10-CM | POA: Diagnosis not present

## 2019-11-12 DIAGNOSIS — R110 Nausea: Secondary | ICD-10-CM | POA: Diagnosis not present

## 2019-11-12 DIAGNOSIS — K5900 Constipation, unspecified: Secondary | ICD-10-CM | POA: Diagnosis not present

## 2019-11-12 DIAGNOSIS — E119 Type 2 diabetes mellitus without complications: Secondary | ICD-10-CM | POA: Diagnosis not present

## 2019-11-12 DIAGNOSIS — Z86010 Personal history of colonic polyps: Secondary | ICD-10-CM | POA: Diagnosis not present

## 2019-11-12 NOTE — Telephone Encounter (Signed)
Routing to LSL 

## 2019-11-12 NOTE — Telephone Encounter (Signed)
[phone]   Patient called and said she is still constipated and her medication is not working, thinks she needs to have a tcs.  Discussed with her daughter and they think this is her best option

## 2019-11-12 NOTE — Telephone Encounter (Signed)
Called pt verified name and dob She stated she isn't having any new problems since her last visit she has just spoken to her daughters and she decided  to move forward with having the TCS since it has been 4 years since her last one. Pt will call the office on Monday to let us know if her linzess 60mcg is working or not

## 2019-11-15 MED ORDER — LINACLOTIDE 290 MCG PO CAPS: 290.0000 ug | ORAL_CAPSULE | Freq: Every day | ORAL | 5 refills | Status: DC

## 2019-11-15 NOTE — Telephone Encounter (Signed)
Notes below have been read. As outlined after her abd xray (see result note), I have advised CT A/P with contrast if she has persistent abdominal pain/bloating after improvement of her constipation. IF HER PAIN/BLOATING ARE IMPROVED WITH BETTER BMS, THEN MAY NOT NEED ANY ADDITIONAL TESTING RIGHT NOW. Would consider colonoscopy only if CT negative.   Let me know if she is having any pain/bloating. If so, then CT A/P with CM first.   RX sent to pharmacy for [place].

## 2019-11-15 NOTE — Addendum Note (Signed)
Addended by: Mahala Menghini on: 11/15/2019 03:39 PM   Modules accepted: Orders

## 2019-11-15 NOTE — Telephone Encounter (Signed)
Pt called stated linzess 266mcg once daily is working well for her and she would like and rx sent into her pharmacy, [facility] in [place]

## 2019-11-16 NOTE — Telephone Encounter (Signed)
Appt made for 8/11@ 11am

## 2019-11-16 NOTE — Telephone Encounter (Signed)
Called pt verified name and dob Notified pt of providers recommendations Pt stated the medication is working good and as of right now she is not having any pain or bloating. At this time she has decided to decline the CT Notified pt that rx was sent into [facility] in [place]

## 2019-11-16 NOTE — Telephone Encounter (Signed)
OK let's have her come back for ov in 3 months.

## 2019-11-16 NOTE — Telephone Encounter (Signed)
Noted, pt declined CT.

## 2019-11-22 ENCOUNTER — Other Ambulatory Visit: Payer: Self-pay

## 2019-11-22 ENCOUNTER — Encounter: Payer: Self-pay | Admitting: Family Medicine

## 2019-11-22 ENCOUNTER — Ambulatory Visit: Payer: Medicare Other | Admitting: Family Medicine

## 2019-11-22 ENCOUNTER — Ambulatory Visit (INDEPENDENT_AMBULATORY_CARE_PROVIDER_SITE_OTHER): Payer: Medicare Other | Admitting: Family Medicine

## 2019-11-22 VITALS — BP 138/56 | HR 60 | Temp 97.3°F | Ht 64.0 in | Wt 138.0 lb

## 2019-11-22 DIAGNOSIS — R6889 Other general symptoms and signs: Secondary | ICD-10-CM | POA: Diagnosis not present

## 2019-11-22 DIAGNOSIS — I10 Essential (primary) hypertension: Secondary | ICD-10-CM

## 2019-11-22 DIAGNOSIS — E119 Type 2 diabetes mellitus without complications: Secondary | ICD-10-CM | POA: Diagnosis not present

## 2019-11-22 DIAGNOSIS — E1169 Type 2 diabetes mellitus with other specified complication: Secondary | ICD-10-CM

## 2019-11-22 DIAGNOSIS — M810 Age-related osteoporosis without current pathological fracture: Secondary | ICD-10-CM

## 2019-11-22 DIAGNOSIS — D649 Anemia, unspecified: Secondary | ICD-10-CM

## 2019-11-22 DIAGNOSIS — E1159 Type 2 diabetes mellitus with other circulatory complications: Secondary | ICD-10-CM | POA: Diagnosis not present

## 2019-11-22 DIAGNOSIS — E785 Hyperlipidemia, unspecified: Secondary | ICD-10-CM

## 2019-11-22 DIAGNOSIS — Z794 Long term (current) use of insulin: Secondary | ICD-10-CM | POA: Diagnosis not present

## 2019-11-22 DIAGNOSIS — E1165 Type 2 diabetes mellitus with hyperglycemia: Secondary | ICD-10-CM

## 2019-11-22 LAB — BAYER DCA HB A1C WAIVED: HB A1C (BAYER DCA - WAIVED): 7.8 % — ABNORMAL HIGH (ref <7.0)

## 2019-11-22 MED ORDER — METFORMIN HCL 500 MG PO TABS: 1000.0000 mg | ORAL_TABLET | Freq: Two times a day (BID) | ORAL | 1 refills | Status: DC

## 2019-11-22 NOTE — Patient Instructions (Addendum)
Sugar is NOT well controlled.  A1c 7.8 today.  I'm going to see if we can get you set up with a Freestyle Libre (continuous sugar monitor) to see where you are having spikes in sugar.  I agree that your morning sugar sounds in range so you must be having elevations during other parts of the day.  I am checking an anemia panel to see why your level was low in the ER.  Your level in February was normal.  Your sinuses show inflammation but no infection. Continue nasal saline rinses and Flonase Start Allegra/Fexofenadine (this is nondrowsy).  If you develop fevers, pus from your nose, let me know as this would indicate an infection

## 2019-11-22 NOTE — Progress Notes (Signed)
Subjective: CC: est care, anemia, DM PCP: Janora Norlander, DO TZG:YFVCBS Susan Petty is a 74 y.o. female presenting to clinic today for:  1. Type 2 Diabetes w/ HTN, HLD:  Patient reports frequent blood sugar checks.  She notes that she will have highs into the 300s and lows into 45.  The lows typically occur at nighttime.  She "checks her blood sugar every time she wakes up in the middle the night".  She injects 20 units of insulin NPH in the morning and 5 units nightly.  She takes 2 tabs of Metformin 500 mg twice daily.  She rarely has to use the Novolin RRR.  She has never seen an endocrinologist despite several uncontrolled A1c's and labile blood sugar.  She reports compliance with her amlodipine, hydrochlorothiazide, Pravachol.  Last eye exam: Had done at [facility] last week Last foot exam: Up-to-date Last A1c:  Lab Results  Component Value Date   HGBA1C 7.9 (H) 08/20/2019   Nephropathy screen indicated?:  Up-to-date Last flu, zoster and/or pneumovax:  Immunization History  Administered Date(s) Administered  . Fluad Quad(high Dose 65+) 04/21/2019  . Influenza, High Dose Seasonal PF 05/05/2013, 04/08/2017, 04/14/2018  . Influenza,inj,Quad PF,6+ Mos 04/13/2014  . Influenza-Unspecified 04/20/2012  . Pneumococcal Conjugate-13 03/14/2015  . Pneumococcal Polysaccharide-23 04/14/2018  . Td 10/18/1996    ROS: Denies diabetic ulcers.  No chest pain, shortness of breath.  2.  Constipation Patient reports chronic constipation.  Infection was seen in the emergency department recently and noted to have quite a bit of stool on plain films.  She has seen her gastroenterologist and her Linzess was increased to 290 mcg daily.  She denies any nausea, vomiting, hematochezia or melena.  She notes that her hemoglobin was slightly low at the emergency department as well.  MCV was normal.  3.  Sinusitis Patient reports a sinus headache with sinus drainage.  She denies any purulence, fevers.  She  reports a chronic cough due to drainage.  She occasionally has dizziness as a result of the drainage as well.  She did try taking Claritin but notes this causes her to be overly sedated.  She has not tried any other oral allergy medicines.  She notes compliance with Flonase and nasal saline drops.  She tried Astelin but did not find this to be helpful.    ROS: Per HPI  Allergies  Allergen Reactions  . Sulfa Antibiotics   . Sulfa Drugs Cross Reactors Rash   Past Medical History:  Diagnosis Date  . Diabetes mellitus   . GERD (gastroesophageal reflux disease)   . Hyperlipidemia   . Hypertension   . Osteoporosis     Current Outpatient Medications:  .  alendronate (FOSAMAX) 70 MG tablet, TAKE ONE TABLET BY MOUTH ONCE A WEEK WITH A FULL GLASS OF WATER ON AN EMPTY STOMACH, Disp: 4 tablet, Rfl: 2 .  amLODipine (NORVASC) 5 MG tablet, Take 1 tablet (5 mg total) by mouth daily., Disp: 90 tablet, Rfl: 1 .  aspirin EC 81 MG tablet, Take 81 mg by mouth daily., Disp: , Rfl:  .  azelastine (ASTELIN) 0.1 % nasal spray, Place 1 spray into both nostrils 2 (two) times daily., Disp: 30 mL, Rfl: 12 .  benazepril (LOTENSIN) 40 MG tablet, Take 1 tablet (40 mg total) by mouth daily., Disp: 90 tablet, Rfl: 1 .  blood glucose meter kit and supplies, Dispense based on patient and insurance preference. Use up to four times daily as directed. (FOR ICD-10 E10.9,  E11.9)., Disp: 1 each, Rfl: 0 .  Cholecalciferol (VITAMIN D-3 PO), Take 2,000 Units by mouth daily., Disp: , Rfl:  .  fluticasone (FLONASE) 50 MCG/ACT nasal spray, Place 2 sprays into both nostrils daily., Disp: 16 g, Rfl: 6 .  glucose blood (ACCU-CHEK AVIVA PLUS) test strip, USE TO CHECK GLUCOSE 6 TIMES DAILY Dx E11.9, Disp: 600 each, Rfl: 3 .  hydrochlorothiazide (HYDRODIURIL) 25 MG tablet, Take 1 tablet (25 mg total) by mouth daily., Disp: 90 tablet, Rfl: 1 .  insulin NPH Human (HUMULIN N) 100 UNIT/ML injection, INJECT 30 UNITS SUBCUTANEOUSLY IN THE  MORNING AND 5 UNITS IN THE EVENING, Disp: 30 mL, Rfl: 3 .  insulin regular (NOVOLIN R) 100 units/mL injection, Inject 0.03 mLs (3 Units total) into the skin 3 (three) times daily before meals. (Patient taking differently: Inject 3 Units into the skin 3 (three) times daily before meals. PRN), Disp: 7.5 mL, Rfl: 1 .  ketoconazole (NIZORAL) 2 % shampoo, Apply to scalp and skin.  Leave on for 10 minutes and then rinse off every 4 days for 4 weeks., Disp: 120 mL, Rfl: 0 .  linaclotide (LINZESS) 290 MCG CAPS capsule, Take 1 capsule (290 mcg total) by mouth daily before breakfast., Disp: 30 capsule, Rfl: 5 .  metFORMIN (GLUCOPHAGE) 500 MG tablet, Take 1 tablet (500 mg total) by mouth 2 (two) times daily with a meal., Disp: 360 tablet, Rfl: 1 .  metoprolol tartrate (LOPRESSOR) 25 MG tablet, Take 1 tablet (25 mg total) by mouth 2 (two) times daily., Disp: 180 tablet, Rfl: 1 .  omeprazole (PRILOSEC) 40 MG capsule, Take 40 mg by mouth 2 (two) times daily before a meal., Disp: , Rfl:  .  ondansetron (ZOFRAN) 4 MG tablet, Take 1 tablet (4 mg total) by mouth every 8 (eight) hours as needed for nausea or vomiting., Disp: 20 tablet, Rfl: 0 .  pravastatin (PRAVACHOL) 40 MG tablet, Take 1 tablet (40 mg total) by mouth daily., Disp: 90 tablet, Rfl: 1 .  Probiotic Product (PROBIOTIC DAILY PO), Take by mouth daily., Disp: , Rfl:  .  psyllium (METAMUCIL) 58.6 % powder, Take 1 packet by mouth daily., Disp: , Rfl:  .  Sod Picosulfate-Mag Ox-Cit Acd (CLENPIQ) 10-3.5-12 MG-GM -GM/160ML SOLN, Use as directed, Disp: 320 mL, Rfl: 0 .  valACYclovir (VALTREX) 1000 MG tablet, 2g twice daily for 2 doses, start as soon as symptoms begin, Disp: 8 tablet, Rfl: 0 Social History   Socioeconomic History  . Marital status: Widowed    Spouse name: Not on file  . Number of children: 2  . Years of education: 101  . Highest education level: High school graduate  Occupational History  . Occupation: retired  Tobacco Use  . Smoking status:  Never Smoker  . Smokeless tobacco: Never Used  Substance and Sexual Activity  . Alcohol use: Yes    Comment: occasional wine  . Drug use: No  . Sexual activity: Not Currently    Birth control/protection: None  Other Topics Concern  . Not on file  Social History Narrative  . Not on file   Social Determinants of Health   Financial Resource Strain: Low Risk   . Difficulty of Paying Living Expenses: Not hard at all  Food Insecurity: No Food Insecurity  . Worried About Charity fundraiser in the Last Year: Never true  . Ran Out of Food in the Last Year: Never true  Transportation Needs: No Transportation Needs  . Lack of Transportation (Medical):  No  . Lack of Transportation (Non-Medical): No  Physical Activity: Insufficiently Active  . Days of Exercise per Week: 2 days  . Minutes of Exercise per Session: 60 min  Stress: No Stress Concern Present  . Feeling of Stress : Only a little  Social Connections: Slightly Isolated  . Frequency of Communication with Friends and Family: More than three times a week  . Frequency of Social Gatherings with Friends and Family: More than three times a week  . Attends Religious Services: More than 4 times per year  . Active Member of Clubs or Organizations: Yes  . Attends Archivist Meetings: More than 4 times per year  . Marital Status: Widowed  Intimate Partner Violence: Not At Risk  . Fear of Current or Ex-Partner: No  . Emotionally Abused: No  . Physically Abused: No  . Sexually Abused: No   Family History  Problem Relation Age of Onset  . Hypertension Mother   . Hypertension Father   . Diabetes Father   . Hypertension Sister   . Cancer Sister   . Diabetes Brother   . Diabetes Brother   . Colon cancer Neg Hx     Objective: Office vital signs reviewed. BP (!) 138/56   Pulse 60   Temp (!) 97.3 F (36.3 C) (Temporal)   Ht '5\' 4"'  (1.626 m)   Wt 138 lb (62.6 kg)   SpO2 97%   BMI 23.69 kg/m   Physical Examination:    General: Awake, alert, well nourished, No acute distress HEENT: Normal    Eyes: PERRLA, extraocular membranes intact, sclera white    Nose: nasal turbinates moist, clear nasal discharge    Throat: moist mucus membranes, no erythema  Cardio: regular rate and rhythm, S1S2 heard, no murmurs appreciated Pulm: clear to auscultation bilaterally, no wheezes, rhonchi or rales; normal work of breathing on room air Extremities: warm, well perfused, No edema, cyanosis or clubbing; +2 pulses bilaterally  Assessment/ Plan: 74 y.o. female   1. Uncontrolled type 2 diabetes mellitus with hyperglycemia ([place]) Diabetes continues to be uncontrolled with A1c at 7.8.  I am unsure as to why this patient has not been referred to endocrinology given ongoing uncontrolled blood sugars.  She seemed fairly nonchalant about her levels and has been under the impression that she has been doing quite well.  We discussed the risk of heart attack and stroke given uncontrolled blood sugars.  I am quite concerned that she is had so much lability in her blood sugar such that she is having highs into the 300s and lows into the 40s.  I have asked that she wear a freestyle libre for the next couple of weeks so that I can get a hold of what is precipitating these levels and where we can make adjustments to her regimen.  For now, I am not going to make any medication adjustments.  Though I do suspect we will likely need to get rid of her nighttime insulin and consider a basal insulin as much of her symptoms may be related to inconsistencies with bolus dosing. - Bayer DCA Hb A1c Waived - metFORMIN (GLUCOPHAGE) 500 MG tablet; Take 2 tablets (1,000 mg total) by mouth 2 (two) times daily with a meal.  Dispense: 360 tablet; Refill: 1  2. Hypertension associated with type 2 diabetes mellitus ([place]) Controlled  3. Hyperlipidemia associated with type 2 diabetes mellitus ([place]) Continue statin  4. Age-related osteoporosis without current  pathological fracture  5. Anemia, unspecified type  Check anemia panel.  Hemoglobin noted to be mildly low at her recent emergency department visit - Anemia panel   No orders of the defined types were placed in this encounter.  No orders of the defined types were placed in this encounter.    Janora Norlander, DO [place] [phone]

## 2019-11-23 LAB — ANEMIA PANEL
Ferritin: 55 ng/mL (ref 15–150)
Folate, Hemolysate: 359 ng/mL
Folate, RBC: 1000 ng/mL (ref >498)
Hematocrit: 35.9 % (ref 34.0–46.6)
Iron Saturation: 33 % (ref 15–55)
Iron: 101 ug/dL (ref 27–139)
Retic Ct Pct: 1.3 % (ref 0.6–2.6)
Total Iron Binding Capacity: 308 ug/dL (ref 250–450)
UIBC: 207 ug/dL (ref 118–369)
Vitamin B-12: 359 pg/mL (ref 232–1245)

## 2019-12-13 ENCOUNTER — Encounter: Payer: Self-pay | Admitting: Family Medicine

## 2019-12-13 ENCOUNTER — Ambulatory Visit (INDEPENDENT_AMBULATORY_CARE_PROVIDER_SITE_OTHER): Payer: Medicare Other | Admitting: Family Medicine

## 2019-12-13 DIAGNOSIS — J0110 Acute frontal sinusitis, unspecified: Secondary | ICD-10-CM | POA: Diagnosis not present

## 2019-12-13 MED ORDER — AMOXICILLIN 500 MG PO CAPS: 500.0000 mg | ORAL_CAPSULE | Freq: Two times a day (BID) | ORAL | 0 refills | Status: DC

## 2019-12-13 NOTE — Progress Notes (Signed)
Virtual Visit via telephone Note  I connected with Susan Petty on 12/13/19 at 1401 by telephone and verified that I am speaking with the correct person using two identifiers. Susan Petty is currently located at home and no other people are currently with her during visit. The provider, Fransisca Kaufmann Saiquan Hands, MD is located in their office at time of visit.  Call ended at 1609  I discussed the limitations, risks, security and privacy concerns of performing an evaluation and management service by telephone and the availability of in person appointments. I also discussed with the patient that there may be a patient responsible charge related to this service. The patient expressed understanding and agreed to proceed.   History and Present Illness: Patient is calling in with ear pain and sore throat and sinus pressure and drainage.  She denies any fever or body aches but did have some chills.  She has a lot of sinus pressure and headache.  She is coughing a lot and kept her awake all night. Her daughter has been ill recently.  She started herself having symptoms 3 days ago. She tried tylenol sinus and it did not work. She took mucinex and it did not help.   No diagnosis found.  Outpatient Encounter Medications as of 12/13/2019  Medication Sig  . alendronate (FOSAMAX) 70 MG tablet TAKE ONE TABLET BY MOUTH ONCE A WEEK WITH A FULL GLASS OF WATER ON AN EMPTY STOMACH  . amLODipine (NORVASC) 5 MG tablet Take 1 tablet (5 mg total) by mouth daily.  Marland Kitchen aspirin EC 81 MG tablet Take 81 mg by mouth daily.  Marland Kitchen azelastine (ASTELIN) 0.1 % nasal spray Place 1 spray into both nostrils 2 (two) times daily.  . benazepril (LOTENSIN) 40 MG tablet Take 1 tablet (40 mg total) by mouth daily.  . blood glucose meter kit and supplies Dispense based on patient and insurance preference. Use up to four times daily as directed. (FOR ICD-10 E10.9, E11.9).  Marland Kitchen Cholecalciferol (VITAMIN D-3 PO) Take 2,000 Units by mouth daily.    . fluticasone (FLONASE) 50 MCG/ACT nasal spray Place 2 sprays into both nostrils daily.  Marland Kitchen glucose blood (ACCU-CHEK AVIVA PLUS) test strip USE TO CHECK GLUCOSE 6 TIMES DAILY Dx E11.9  . hydrochlorothiazide (HYDRODIURIL) 25 MG tablet Take 1 tablet (25 mg total) by mouth daily.  . insulin NPH Human (HUMULIN N) 100 UNIT/ML injection INJECT 30 UNITS SUBCUTANEOUSLY IN THE MORNING AND 5 UNITS IN THE EVENING  . insulin regular (NOVOLIN R) 100 units/mL injection Inject 0.03 mLs (3 Units total) into the skin 3 (three) times daily before meals. (Patient taking differently: Inject 3 Units into the skin 3 (three) times daily before meals. PRN)  . ketoconazole (NIZORAL) 2 % shampoo Apply to scalp and skin.  Leave on for 10 minutes and then rinse off every 4 days for 4 weeks.  Marland Kitchen linaclotide (LINZESS) 290 MCG CAPS capsule Take 1 capsule (290 mcg total) by mouth daily before breakfast.  . metFORMIN (GLUCOPHAGE) 500 MG tablet Take 2 tablets (1,000 mg total) by mouth 2 (two) times daily with a meal.  . metoprolol tartrate (LOPRESSOR) 25 MG tablet Take 1 tablet (25 mg total) by mouth 2 (two) times daily.  Marland Kitchen omeprazole (PRILOSEC) 40 MG capsule Take 40 mg by mouth 2 (two) times daily before a meal.  . ondansetron (ZOFRAN) 4 MG tablet Take 1 tablet (4 mg total) by mouth every 8 (eight) hours as needed for nausea or vomiting.  Marland Kitchen  pravastatin (PRAVACHOL) 40 MG tablet Take 1 tablet (40 mg total) by mouth daily.  . Probiotic Product (PROBIOTIC DAILY PO) Take by mouth daily.  . psyllium (METAMUCIL) 58.6 % powder Take 1 packet by mouth daily.  . Sod Picosulfate-Mag Ox-Cit Acd (CLENPIQ) 10-3.5-12 MG-GM -GM/160ML SOLN Use as directed  . valACYclovir (VALTREX) 1000 MG tablet 2g twice daily for 2 doses, start as soon as symptoms begin   No facility-administered encounter medications on file as of 12/13/2019.    Review of Systems  Constitutional: Positive for chills. Negative for fever.  HENT: Positive for congestion, postnasal  drip, rhinorrhea, sinus pressure, sneezing and sore throat. Negative for ear discharge and ear pain.   Eyes: Negative for pain, redness and visual disturbance.  Respiratory: Positive for cough. Negative for chest tightness and shortness of breath.   Cardiovascular: Negative for chest pain and leg swelling.  Musculoskeletal: Negative for back pain and gait problem.  Skin: Negative for rash.  Neurological: Negative for light-headedness and headaches.  Psychiatric/Behavioral: Negative for agitation and behavioral problems.  All other systems reviewed and are negative.   Observations/Objective: Patient sounds comfortable and in no acute distress  Assessment and Plan: Problem List Items Addressed This Visit    None    Visit Diagnoses    Acute non-recurrent frontal sinusitis    -  Primary   Relevant Medications   amoxicillin (AMOXIL) 500 MG capsule      Sounds like she had one sinus infection symptoms, will go ahead and treat with amoxicillin and watch for any changes. Follow up plan: Return if symptoms worsen or fail to improve.     I discussed the assessment and treatment plan with the patient. The patient was provided an opportunity to ask questions and all were answered. The patient agreed with the plan and demonstrated an understanding of the instructions.   The patient was advised to call back or seek an in-person evaluation if the symptoms worsen or if the condition fails to improve as anticipated.  The above assessment and management plan was discussed with the patient. The patient verbalized understanding of and has agreed to the management plan. Patient is aware to call the clinic if symptoms persist or worsen. Patient is aware when to return to the clinic for a follow-up visit. Patient educated on when it is appropriate to go to the emergency department.    I provided 8 wall ice and minutes of non-face-to-face time during this encounter.    Worthy Rancher, MD

## 2020-01-12 ENCOUNTER — Other Ambulatory Visit: Payer: Self-pay | Admitting: *Deleted

## 2020-01-12 DIAGNOSIS — E1159 Type 2 diabetes mellitus with other circulatory complications: Secondary | ICD-10-CM

## 2020-01-12 DIAGNOSIS — E785 Hyperlipidemia, unspecified: Secondary | ICD-10-CM

## 2020-01-12 MED ORDER — OMEPRAZOLE 40 MG PO CPDR: 40.0000 mg | DELAYED_RELEASE_CAPSULE | Freq: Two times a day (BID) | ORAL | 1 refills | Status: DC

## 2020-01-12 MED ORDER — HYDROCHLOROTHIAZIDE 25 MG PO TABS: 25.0000 mg | ORAL_TABLET | Freq: Every day | ORAL | 1 refills | Status: DC

## 2020-01-12 MED ORDER — METOPROLOL TARTRATE 25 MG PO TABS: 25.0000 mg | ORAL_TABLET | Freq: Two times a day (BID) | ORAL | 1 refills | Status: DC

## 2020-01-12 MED ORDER — BENAZEPRIL HCL 40 MG PO TABS: 40.0000 mg | ORAL_TABLET | Freq: Every day | ORAL | 1 refills | Status: DC

## 2020-01-12 MED ORDER — PRAVASTATIN SODIUM 40 MG PO TABS: 40.0000 mg | ORAL_TABLET | Freq: Every day | ORAL | 1 refills | Status: DC

## 2020-01-12 MED ORDER — AMLODIPINE BESYLATE 5 MG PO TABS: 5.0000 mg | ORAL_TABLET | Freq: Every day | ORAL | 1 refills | Status: DC

## 2020-01-13 ENCOUNTER — Other Ambulatory Visit: Payer: Self-pay | Admitting: *Deleted

## 2020-01-13 DIAGNOSIS — M810 Age-related osteoporosis without current pathological fracture: Secondary | ICD-10-CM

## 2020-01-13 MED ORDER — ALENDRONATE SODIUM 70 MG PO TABS: ORAL_TABLET | ORAL | 2 refills | Status: DC

## 2020-01-24 DIAGNOSIS — X32XXXD Exposure to sunlight, subsequent encounter: Secondary | ICD-10-CM | POA: Diagnosis not present

## 2020-01-24 DIAGNOSIS — L570 Actinic keratosis: Secondary | ICD-10-CM | POA: Diagnosis not present

## 2020-01-28 ENCOUNTER — Ambulatory Visit (INDEPENDENT_AMBULATORY_CARE_PROVIDER_SITE_OTHER): Payer: Medicare Other | Admitting: Family Medicine

## 2020-01-28 DIAGNOSIS — R0989 Other specified symptoms and signs involving the circulatory and respiratory systems: Secondary | ICD-10-CM

## 2020-01-28 DIAGNOSIS — R110 Nausea: Secondary | ICD-10-CM | POA: Diagnosis not present

## 2020-01-28 MED ORDER — PROMETHAZINE HCL 25 MG PO TABS: 12.5000 mg | ORAL_TABLET | Freq: Three times a day (TID) | ORAL | 0 refills | Status: DC | PRN

## 2020-01-28 NOTE — Progress Notes (Signed)
Telephone visit  Subjective: CC: cough, nausea PCP: Janora Norlander, DO KAJ:GOTLXB Susan Petty is a 74 y.o. female calls for telephone consult today. Patient provides verbal consent for consult held via phone.  Due to COVID-19 pandemic this visit was conducted virtually. This visit type was conducted due to national recommendations for restrictions regarding the COVID-19 Pandemic (e.g. social distancing, sheltering in place) in an effort to limit this patient's exposure and mitigate transmission in our community. All issues noted in this document were discussed and addressed.  A physical exam was not performed with this format.   Location of patient: home Location of provider: [facility] Others present for call: none  1. Chest congestion, nausea Patient had a virus a few weeks ago and "got over it".  She denies fevers, diarrhea. She reports nausea.  No spikes in BGs.  BMs normal.  No abdominal pain.  She reports chest congestion.  No hemoptysis.  She is using vaporizer.  No myalgia.  Immunization History  Administered Date(s) Administered  . Fluad Quad(high Dose 65+) 04/21/2019  . Influenza, High Dose Seasonal PF 05/05/2013, 04/08/2017, 04/14/2018  . Influenza,inj,Quad PF,6+ Mos 04/13/2014  . Influenza-Unspecified 04/20/2012  . Pneumococcal Conjugate-13 03/14/2015  . Pneumococcal Polysaccharide-23 04/14/2018  . Td 10/18/1996    ROS: Per HPI  Allergies  Allergen Reactions  . Sulfa Antibiotics   . Sulfa Drugs Cross Reactors Rash   Past Medical History:  Diagnosis Date  . Diabetes mellitus   . GERD (gastroesophageal reflux disease)   . Hyperlipidemia   . Hypertension   . Osteoporosis     Current Outpatient Medications:  .  alendronate (FOSAMAX) 70 MG tablet, TAKE ONE TABLET BY MOUTH ONCE A WEEK WITH A FULL GLASS OF WATER ON AN EMPTY STOMACH, Disp: 4 tablet, Rfl: 2 .  amLODipine (NORVASC) 5 MG tablet, Take 1 tablet (5 mg total) by mouth daily., Disp: 90 tablet, Rfl: 1 .  aspirin  EC 81 MG tablet, Take 81 mg by mouth daily., Disp: , Rfl:  .  azelastine (ASTELIN) 0.1 % nasal spray, Place 1 spray into both nostrils 2 (two) times daily., Disp: 30 mL, Rfl: 12 .  benazepril (LOTENSIN) 40 MG tablet, Take 1 tablet (40 mg total) by mouth daily., Disp: 90 tablet, Rfl: 1 .  blood glucose meter kit and supplies, Dispense based on patient and insurance preference. Use up to four times daily as directed. (FOR ICD-10 E10.9, E11.9)., Disp: 1 each, Rfl: 0 .  Cholecalciferol (VITAMIN D-3 PO), Take 2,000 Units by mouth daily., Disp: , Rfl:  .  fluticasone (FLONASE) 50 MCG/ACT nasal spray, Place 2 sprays into both nostrils daily., Disp: 16 g, Rfl: 6 .  glucose blood (ACCU-CHEK AVIVA PLUS) test strip, USE TO CHECK GLUCOSE 6 TIMES DAILY Dx E11.9, Disp: 600 each, Rfl: 3 .  hydrochlorothiazide (HYDRODIURIL) 25 MG tablet, Take 1 tablet (25 mg total) by mouth daily., Disp: 90 tablet, Rfl: 1 .  insulin NPH Human (HUMULIN N) 100 UNIT/ML injection, INJECT 30 UNITS SUBCUTANEOUSLY IN THE MORNING AND 5 UNITS IN THE EVENING, Disp: 30 mL, Rfl: 3 .  insulin regular (NOVOLIN R) 100 units/mL injection, Inject 0.03 mLs (3 Units total) into the skin 3 (three) times daily before meals. (Patient taking differently: Inject 3 Units into the skin 3 (three) times daily before meals. PRN), Disp: 7.5 mL, Rfl: 1 .  ketoconazole (NIZORAL) 2 % shampoo, Apply to scalp and skin.  Leave on for 10 minutes and then rinse off every 4 days  for 4 weeks., Disp: 120 mL, Rfl: 0 .  linaclotide (LINZESS) 290 MCG CAPS capsule, Take 1 capsule (290 mcg total) by mouth daily before breakfast., Disp: 30 capsule, Rfl: 5 .  metFORMIN (GLUCOPHAGE) 500 MG tablet, Take 2 tablets (1,000 mg total) by mouth 2 (two) times daily with a meal., Disp: 360 tablet, Rfl: 1 .  metoprolol tartrate (LOPRESSOR) 25 MG tablet, Take 1 tablet (25 mg total) by mouth 2 (two) times daily., Disp: 180 tablet, Rfl: 1 .  omeprazole (PRILOSEC) 40 MG capsule, Take 1 capsule  (40 mg total) by mouth 2 (two) times daily before a meal., Disp: 180 capsule, Rfl: 1 .  pravastatin (PRAVACHOL) 40 MG tablet, Take 1 tablet (40 mg total) by mouth daily., Disp: 90 tablet, Rfl: 1 .  Probiotic Product (PROBIOTIC DAILY PO), Take by mouth daily., Disp: , Rfl:  .  promethazine (PHENERGAN) 25 MG tablet, Take 0.5-1 tablets (12.5-25 mg total) by mouth every 8 (eight) hours as needed for nausea or vomiting., Disp: 20 tablet, Rfl: 0 .  psyllium (METAMUCIL) 58.6 % powder, Take 1 packet by mouth daily., Disp: , Rfl:  .  Sod Picosulfate-Mag Ox-Cit Acd (CLENPIQ) 10-3.5-12 MG-GM -GM/160ML SOLN, Use as directed, Disp: 320 mL, Rfl: 0 .  valACYclovir (VALTREX) 1000 MG tablet, 2g twice daily for 2 doses, start as soon as symptoms begin, Disp: 8 tablet, Rfl: 0  Assessment/ Plan: 74 y.o. female   1. Nausea Likely viral.  Though she has had recurrent nausea over the last year.  Unsure if this is related to constipation or some other GI issue.  If persistent, highly recommend that she follow-up with her gastroenterologist.  Symptoms have been refractory to Zofran in the past and therefore I have switched her to promethazine.  Caution sedation and dryness.  She understands red flag signs and symptoms warranting further evaluation.  Push oral fluids - promethazine (PHENERGAN) 25 MG tablet; Take 0.5-1 tablets (12.5-25 mg total) by mouth every 8 (eight) hours as needed for nausea or vomiting.  Dispense: 20 tablet; Refill: 0  2. Chest congestion Mucinex recommended.  Take this with a meal so that it does not cause nausea to worsen.  At this time does not sound bacterial.  We discussed red flag signs and symptoms warranting further evaluation.  Unfortunately, she is not vaccinated against Covid and I highly reinforced that she stay isolated and avoid other folks as her risk of Covid complications is high.   Start time: 8:51am End time: 9:00am  Total time spent on patient care (including telephone call/  virtual visit): 12 minutes  [place], [place] [phone]

## 2020-01-28 NOTE — Patient Instructions (Signed)
Nausea, Adult Nausea is feeling sick to your stomach or feeling that you are about to throw up (vomit). Feeling sick to your stomach is usually not serious, but it may be an early sign of a more serious medical problem. As you feel sicker to your stomach, you may throw up. If you throw up, or if you are not able to drink enough fluids, there is a risk that you may lose too much water in your body (get dehydrated). If you lose too much water in your body, you may:  Feel tired.  Feel thirsty.  Have a dry mouth.  Have cracked lips.  Go pee (urinate) less often. Older adults and people who have other diseases or a weak body defense system (immune system) have a higher risk of losing too much water in the body. The main goals of treating this condition are:  To relieve your nausea.  To ensure your nausea occurs less often.  To prevent throwing up and losing too much fluid. Follow these instructions at home: Watch your symptoms for any changes. Tell your doctor about them. Follow these instructions as told by your doctor. Eating and drinking      Take an ORS (oral rehydration solution). This is a drink that is sold at pharmacies and stores.  Drink clear fluids in small amounts as you are able. These include: ? Water. ? Ice chips. ? Fruit juice that has water added (diluted fruit juice). ? Low-calorie sports drinks.  Eat bland, easy-to-digest foods in small amounts as you are able, such as: ? Bananas. ? Applesauce. ? Rice. ? Low-fat (lean) meats. ? Toast. ? Crackers.  Avoid drinking fluids that have a lot of sugar or caffeine in them. This includes energy drinks, sports drinks, and soda.  Avoid alcohol.  Avoid spicy or fatty foods. General instructions  Take over-the-counter and prescription medicines only as told by your doctor.  Rest at home while you get better.  Drink enough fluid to keep your pee (urine) pale yellow.  Take slow and deep breaths when you feel  sick to your stomach.  Avoid food or things that have strong smells.  Wash your hands often with soap and water. If you cannot use soap and water, use hand sanitizer.  Make sure that all people in your home wash their hands well and often.  Keep all follow-up visits as told by your doctor. This is important. Contact a doctor if:  You feel sicker to your stomach.  You feel sick to your stomach for more than 2 days.  You throw up.  You are not able to drink fluids without throwing up.  You have new symptoms.  You have a fever.  You have a headache.  You have muscle cramps.  You have a rash.  You have pain while peeing.  You feel light-headed or dizzy. Get help right away if:  You have pain in your chest, neck, arm, or jaw.  You feel very weak or you pass out (faint).  You have throw up that is bright red or looks like coffee grounds.  You have bloody or black poop (stools) or poop that looks like tar.  You have a very bad headache, a stiff neck, or both.  You have very bad pain, cramping, or bloating in your belly (abdomen).  You have trouble breathing or you are breathing very quickly.  Your heart is beating very quickly.  Your skin feels cold and clammy.  You feel confused.    You have signs of losing too much water in your body, such as: ? Dark pee, very little pee, or no pee. ? Cracked lips. ? Dry mouth. ? Sunken eyes. ? Sleepiness. ? Weakness. These symptoms may be an emergency. Do not wait to see if the symptoms will go away. Get medical help right away. Call your local emergency services (911 in the [place] Do not drive yourself to the hospital. Summary  Nausea is feeling sick to your stomach or feeling that you are about to throw up (vomit).  If you throw up, or if you are not able to drink enough fluids, there is a risk that you may lose too much water in your body (get dehydrated).  Eat and drink what your doctor tells you. Take  over-the-counter and prescription medicines only as told by your doctor.  Contact a doctor right away if your symptoms get worse or you have new symptoms.  Keep all follow-up visits as told by your doctor. This is important. This information is not intended to replace advice given to you by your health care provider. Make sure you discuss any questions you have with your health care provider. Document Revised: 12/02/2017 Document Reviewed: 12/02/2017 Elsevier Patient Education  [employer].   It appears that you have a viral upper respiratory infection (cold).  Cold symptoms can last up to 2 weeks.  I recommend that you only use cold medications that are safe in high blood pressure like Coricidin (generic is fine).  Other cold medications can increase your blood pressure.    - Get plenty of rest and drink plenty of fluids. - Try to breathe moist air. Use a cold mist humidifier. - Consume warm fluids (soup or tea) to provide relief for a stuffy nose and to loosen phlegm. - For nasal stuffiness, try saline nasal spray or a Neti Pot.  Afrin nasal spray can also be used but this product should not be used longer than 3 days or it will cause rebound nasal stuffiness (worsening nasal congestion). - For sore throat pain relief: use chloraseptic spray, suck on throat lozenges, hard candy or popsicles; gargle with warm salt water (1/4 tsp. salt per 8 oz. of water); and eat soft, bland foods. - Eat a well-balanced diet. If you cannot, ensure you are getting enough nutrients by taking a daily multivitamin. - Avoid dairy products, as they can thicken phlegm. - Avoid alcohol, as it impairs your body's immune system.  CONTACT YOUR DOCTOR IF YOU EXPERIENCE ANY OF THE FOLLOWING: - High fever - Ear pain - Sinus-type headache - Unusually severe cold symptoms - Cough that gets worse while other cold symptoms improve - Flare up of any chronic lung problem, such as asthma - Your symptoms persist  longer than 2 weeks

## 2020-02-02 ENCOUNTER — Encounter: Payer: Self-pay | Admitting: Family Medicine

## 2020-02-03 ENCOUNTER — Encounter: Payer: Self-pay | Admitting: Nurse Practitioner

## 2020-02-03 ENCOUNTER — Ambulatory Visit (INDEPENDENT_AMBULATORY_CARE_PROVIDER_SITE_OTHER): Payer: Medicare Other | Admitting: Nurse Practitioner

## 2020-02-03 DIAGNOSIS — H9201 Otalgia, right ear: Secondary | ICD-10-CM | POA: Diagnosis not present

## 2020-02-03 DIAGNOSIS — J00 Acute nasopharyngitis [common cold]: Secondary | ICD-10-CM | POA: Diagnosis not present

## 2020-02-03 MED ORDER — AMOXICILLIN-POT CLAVULANATE 875-125 MG PO TABS
1.0000 | ORAL_TABLET | Freq: Two times a day (BID) | ORAL | 0 refills | Status: DC
Start: 2020-02-03 — End: 2020-02-16

## 2020-02-03 NOTE — Progress Notes (Signed)
Virtual Visit via telephone Note Due to COVID-19 pandemic this visit was conducted virtually. This visit type was conducted due to national recommendations for restrictions regarding the COVID-19 Pandemic (e.g. social distancing, sheltering in place) in an effort to limit this patient's exposure and mitigate transmission in our community. All issues noted in this document were discussed and addressed.  A physical exam was not performed with this format.  I connected with Susan Petty on 02/03/20 at 12.30 by telephone and verified that I am speaking with the correct person using two identifiers. Susan Petty is currently located at home and no one is currently with her during visit. The provider, Mary-Margaret Hassell Done, FNP is located in their office at time of visit.  I discussed the limitations, risks, security and privacy concerns of performing an evaluation and management service by telephone and the availability of in person appointments. I also discussed with the patient that there may be a patient responsible charge related to this service. The patient expressed understanding and agreed to proceed.   History and Present Illness:   Chief Complaint: Cough   HPI patient said she had cold symptoms last week and now she has cough and ear ache. Denies fever. Says cough is productive.   Review of Systems  Constitutional: Positive for malaise/fatigue. Negative for chills, fever and weight loss.  HENT: Positive for congestion and ear pain. Negative for ear discharge, sinus pain and sore throat.   Respiratory: Positive for cough (productive).   Neurological: Negative for dizziness and headaches.  All other systems reviewed and are negative.    Observations/Objective: Alert and oriented- answers all questions appropriately No distress Vice hoarse No cough noted  Assessment and Plan: [name] in today with chief complaint of Cough   1. Acute nasopharyngitis  2. Right  ear pain 1. Take meds as prescribed 2. Use a cool mist humidifier especially during the winter months and when heat has been humid. 3. Use saline nose sprays frequently 4. Saline irrigations of the nose can be very helpful if done frequently.  * 4X daily for 1 week*  * Use of a nettie pot can be helpful with this. Follow directions with this* 5. Drink plenty of fluids 6. Keep thermostat turn down low 7.For any cough or congestion  Use plain Mucinex- regular strength or max strength is fine   * Children- consult with Pharmacist for dosing 8. For fever or aces or pains- take tylenol or ibuprofen appropriate for age and weight.  * for fevers greater than 101 orally you may alternate ibuprofen and tylenol every  3 hours.   Meds ordered this encounter  Medications  . amoxicillin-clavulanate (AUGMENTIN) 875-125 MG tablet    Sig: Take 1 tablet by mouth 2 (two) times daily.    Dispense:  14 tablet    Refill:  0    Order Specific Question:   Supervising Provider    Answer:   Caryl Pina A [[id number]]       Follow Up Instructions: prn    I discussed the assessment and treatment plan with the patient. The patient was provided an opportunity to ask questions and all were answered. The patient agreed with the plan and demonstrated an understanding of the instructions.   The patient was advised to call back or seek an in-person evaluation if the symptoms worsen or if the condition fails to improve as anticipated.  The above assessment and management plan was discussed with the patient. The patient  verbalized understanding of and has agreed to the management plan. Patient is aware to call the clinic if symptoms persist or worsen. Patient is aware when to return to the clinic for a follow-up visit. Patient educated on when it is appropriate to go to the emergency department.   Time call ended:  10:45  I provided 15 minutes of non-face-to-face time during this  encounter.    Mary-Margaret Hassell Done, FNP

## 2020-02-16 ENCOUNTER — Ambulatory Visit: Payer: Medicare Other | Admitting: Gastroenterology

## 2020-02-16 ENCOUNTER — Encounter: Payer: Self-pay | Admitting: Gastroenterology

## 2020-02-16 ENCOUNTER — Other Ambulatory Visit: Payer: Self-pay

## 2020-02-16 VITALS — BP 171/67 | HR 73 | Temp 97.1°F | Ht 64.0 in | Wt 136.8 lb

## 2020-02-16 DIAGNOSIS — K5900 Constipation, unspecified: Secondary | ICD-10-CM | POA: Diagnosis not present

## 2020-02-16 DIAGNOSIS — K219 Gastro-esophageal reflux disease without esophagitis: Secondary | ICD-10-CM

## 2020-02-16 MED ORDER — PANTOPRAZOLE SODIUM 40 MG PO TBEC: DELAYED_RELEASE_TABLET | ORAL | 5 refills | Status: DC

## 2020-02-16 MED ORDER — ONDANSETRON HCL 4 MG PO TABS
4.0000 mg | ORAL_TABLET | Freq: Three times a day (TID) | ORAL | 1 refills | Status: DC | PRN
Start: 2020-02-16 — End: 2021-07-06

## 2020-02-16 NOTE — Progress Notes (Signed)
Primary Care Physician: Janora Norlander, DO  Primary Gastroenterologist:  Elon Alas. Abbey Chatters, DO   Chief Complaint  Patient presents with  . Gastroesophageal Reflux  . Constipation    better  . Nausea    occ, no vomiting    HPI: Susan Petty is a 74 y.o. female here for follow up.   EGD and colonoscopy September 2017. She had a patent Schatzki ring, small hiatal hernia, small gastric polyp, biopsy proven fundic gland, mild gastritis, normal small bowel biopsies. Redundant left colon, nonbleeding internal hemorrhoids. Consider colonoscopy in 10 to 15 years if benefits outweigh the risk.   Early morning wakes up and coughs up slimy white stuff. For the past 2-3 weeks symptoms have been worse than usual. No SOB. No vomiting. Voice hoarse in morning and clears up after lunch. Occasional nausea. No heartburn. BM once per day. Soft stool. No melena, brbpr. No abdominal pain. Some gas.    Current Outpatient Medications  Medication Sig Dispense Refill  . alendronate (FOSAMAX) 70 MG tablet TAKE ONE TABLET BY MOUTH ONCE A WEEK WITH A FULL GLASS OF WATER ON AN EMPTY STOMACH 4 tablet 2  . amLODipine (NORVASC) 5 MG tablet Take 1 tablet (5 mg total) by mouth daily. 90 tablet 1  . aspirin EC 81 MG tablet Take 81 mg by mouth daily.    Marland Kitchen azelastine (ASTELIN) 0.1 % nasal spray Place 1 spray into both nostrils 2 (two) times daily. (Patient taking differently: Place 1 spray into both nostrils as needed. ) 30 mL 12  . benazepril (LOTENSIN) 40 MG tablet Take 1 tablet (40 mg total) by mouth daily. 90 tablet 1  . blood glucose meter kit and supplies Dispense based on patient and insurance preference. Use up to four times daily as directed. (FOR ICD-10 E10.9, E11.9). 1 each 0  . Cholecalciferol (VITAMIN D-3 PO) Take 2,000 Units by mouth daily.    . fluticasone (FLONASE) 50 MCG/ACT nasal spray Place 2 sprays into both nostrils daily. 16 g 6  . glucose blood (ACCU-CHEK AVIVA PLUS) test strip  USE TO CHECK GLUCOSE 6 TIMES DAILY Dx E11.9 600 each 3  . hydrochlorothiazide (HYDRODIURIL) 25 MG tablet Take 1 tablet (25 mg total) by mouth daily. 90 tablet 1  . insulin NPH Human (HUMULIN N) 100 UNIT/ML injection INJECT 30 UNITS SUBCUTANEOUSLY IN THE MORNING AND 5 UNITS IN THE EVENING 30 mL 3  . insulin regular (NOVOLIN R) 100 units/mL injection Inject 0.03 mLs (3 Units total) into the skin 3 (three) times daily before meals. (Patient taking differently: Inject 3 Units into the skin 3 (three) times daily before meals. PRN) 7.5 mL 1  . linaclotide (LINZESS) 290 MCG CAPS capsule Take 1 capsule (290 mcg total) by mouth daily before breakfast. 30 capsule 5  . metFORMIN (GLUCOPHAGE) 500 MG tablet Take 2 tablets (1,000 mg total) by mouth 2 (two) times daily with a meal. 360 tablet 1  . metoprolol tartrate (LOPRESSOR) 25 MG tablet Take 1 tablet (25 mg total) by mouth 2 (two) times daily. 180 tablet 1  . omeprazole (PRILOSEC) 40 MG capsule Take 1 capsule (40 mg total) by mouth 2 (two) times daily before a meal. 180 capsule 1  . pravastatin (PRAVACHOL) 40 MG tablet Take 1 tablet (40 mg total) by mouth daily. 90 tablet 1  . psyllium (METAMUCIL) 58.6 % powder Take 1 packet by mouth daily.     No current facility-administered medications for this visit.  Allergies as of 02/16/2020 - Review Complete 02/16/2020  Allergen Reaction Noted  . Sulfa antibiotics  06/20/2012  . Sulfa drugs cross reactors Rash 09/02/2011    ROS:  General: Negative for anorexia, weight loss, fever, chills, fatigue, weakness. ENT: Negative for difficulty swallowing , nasal congestion.see hpi CV: Negative for chest pain, angina, palpitations, dyspnea on exertion, peripheral edema.  Respiratory: Negative for dyspnea at rest, dyspnea on exertion, +cough, sputum, wheezing.  GI: See history of present illness. GU:  Negative for dysuria, hematuria, urinary incontinence, urinary frequency, nocturnal urination.  Endo: Negative for  unusual weight change.    Physical Examination:   BP (!) 171/67   Pulse 73   Temp (!) 97.1 F (36.2 C) (Temporal)   Ht _0  (1.626 m)   Wt 136 lb 12.8 oz (62.1 kg)   BMI 23.48 kg/m   General: Well-nourished, well-developed in no acute distress.  Eyes: No icterus. Mouth: masked. Lungs: Clear to auscultation bilaterally.  Heart: Regular rate and rhythm, no murmurs rubs or gallops.  Abdomen: Bowel sounds are normal, nontender, nondistended, no hepatosplenomegaly or masses, no abdominal bruits or hernia , no rebound or guarding.   Extremities: No lower extremity edema. No clubbing or deformities. Neuro: Alert and oriented x 4   Skin: Warm and dry, no jaundice.   Psych: Alert and cooperative, normal mood and affect.  Labs:  Lab Results  Component Value Date   CREATININE 0.79 08/20/2019   BUN 12 08/20/2019   NA 140 08/20/2019   K 4.5 08/20/2019   CL 101 08/20/2019   CO2 24 08/20/2019   Lab Results  Component Value Date   ALT 12 08/20/2019   AST 21 08/20/2019   ALKPHOS 88 08/20/2019   BILITOT <0.2 08/20/2019   Lab Results  Component Value Date   WBC 6.0 08/20/2019   HGB 12.6 08/20/2019   HCT 35.9 11/22/2019   MCV 92 08/20/2019   PLT 224 08/20/2019   Lab Results  Component Value Date   HGBA1C 7.8 (H) 11/22/2019     Imaging Studies: No results found.   Impression/plan:  Pleasant 74 y/o female presenting for follow up GERD, constipation.  GERD: typically has been difficult to manage especially at night time. Currently complains of hoarseness and coughing up white phlegm in the mornings. Typical heartburn controlled. Possibly LPR and nocturnal reflux. Switch omeprazole to pantoprazole 15m daily before breakfast and evening snack. zofran for nausea. Call with progress report in few weeks. She is aware that LPR symptoms can take several months to completely go away but we are looking for some improvement after several weeks.   Constipation: continue Linzess 2949m  daily.   Return to the office in four months to meet Dr. CaAbbey Chatters

## 2020-02-16 NOTE — Patient Instructions (Addendum)
1. Stop omeprazole. Start pantoprazole 40mg  one before breakfast and one before evening snack.  2. zofran for nausea sent to your pharmacy.  3. Continue Linzess for constipation. 4. Call and let me know how your reflux is doing after on the new medication at least two weeks. Hoarseness and cough can take 3 months to go completely away if related to reflux but you should notice some improvement after a few weeks.   5. Return to see Dr. Abbey Chatters in four months.

## 2020-02-23 NOTE — Progress Notes (Signed)
Cc'ed to pcp °

## 2020-03-17 DIAGNOSIS — H04123 Dry eye syndrome of bilateral lacrimal glands: Secondary | ICD-10-CM | POA: Diagnosis not present

## 2020-03-17 DIAGNOSIS — H40033 Anatomical narrow angle, bilateral: Secondary | ICD-10-CM | POA: Diagnosis not present

## 2020-03-23 ENCOUNTER — Ambulatory Visit (INDEPENDENT_AMBULATORY_CARE_PROVIDER_SITE_OTHER): Payer: Medicare Other | Admitting: Family Medicine

## 2020-03-23 ENCOUNTER — Encounter: Payer: Self-pay | Admitting: Family Medicine

## 2020-03-23 DIAGNOSIS — B001 Herpesviral vesicular dermatitis: Secondary | ICD-10-CM

## 2020-03-23 MED ORDER — VALACYCLOVIR HCL 1 G PO TABS
1000.0000 mg | ORAL_TABLET | Freq: Two times a day (BID) | ORAL | 0 refills | Status: DC
Start: 2020-03-23 — End: 2020-08-22

## 2020-03-23 NOTE — Progress Notes (Signed)
   Virtual Visit via telephone Note  I connected with Susan Petty on 03/23/20 at 1752 by telephone and verified that I am speaking with the correct person using two identifiers. Susan Petty is currently located at home and patient are currently with her during visit. The provider, Fransisca Kaufmann Myli Pae, MD is located in their office at time of visit.  Call ended at 1757  I discussed the limitations, risks, security and privacy concerns of performing an evaluation and management service by telephone and the availability of in person appointments. I also discussed with the patient that there may be a patient responsible charge related to this service. The patient expressed understanding and agreed to proceed.   History and Present Illness: Patient is having a lot of cold sore in her mouth and is coming on her lips and in her mouth.  She is using abreva and she had this in this past and took valtrex and it helped.  The abreva is not helping. She had last episode 5 years ago.  She denies fevers or chills. This episod started 4 days.   No diagnosis found.    Review of Systems  Constitutional: Negative for chills and fever.  HENT: Positive for mouth sores. Negative for ear discharge and ear pain.   Eyes: Negative for redness and visual disturbance.  Respiratory: Negative for chest tightness and shortness of breath.   Cardiovascular: Negative for chest pain and leg swelling.  Genitourinary: Negative for difficulty urinating and dysuria.  Musculoskeletal: Negative for back pain and gait problem.  Skin: Negative for rash.  Neurological: Negative for light-headedness and headaches.  Psychiatric/Behavioral: Negative for agitation and behavioral problems.  All other systems reviewed and are negative.   Observations/Objective: Patient sounds comfortable  Assessment and Plan: Problem List Items Addressed This Visit    None    Visit Diagnoses    Cold sore    -  Primary   Relevant  Medications   valACYclovir (VALTREX) 1000 MG tablet       Follow up plan: Return if symptoms worsen or fail to improve.     I discussed the assessment and treatment plan with the patient. The patient was provided an opportunity to ask questions and all were answered. The patient agreed with the plan and demonstrated an understanding of the instructions.   The patient was advised to call back or seek an in-person evaluation if the symptoms worsen or if the condition fails to improve as anticipated.  The above assessment and management plan was discussed with the patient. The patient verbalized understanding of and has agreed to the management plan. Patient is aware to call the clinic if symptoms persist or worsen. Patient is aware when to return to the clinic for a follow-up visit. Patient educated on when it is appropriate to go to the emergency department.    I provided 5 minutes of non-face-to-face time during this encounter.    Worthy Rancher, MD

## 2020-04-10 ENCOUNTER — Other Ambulatory Visit: Payer: Self-pay | Admitting: Family Medicine

## 2020-04-10 DIAGNOSIS — Z1231 Encounter for screening mammogram for malignant neoplasm of breast: Secondary | ICD-10-CM

## 2020-04-14 ENCOUNTER — Other Ambulatory Visit: Payer: Self-pay

## 2020-04-14 DIAGNOSIS — E119 Type 2 diabetes mellitus without complications: Secondary | ICD-10-CM

## 2020-04-14 MED ORDER — ACCU-CHEK AVIVA PLUS VI STRP: ORAL_STRIP | 3 refills | Status: DC

## 2020-04-14 NOTE — Telephone Encounter (Signed)
LMOVM refill sent to pharmacy 

## 2020-04-14 NOTE — Telephone Encounter (Signed)
  Prescription Request  04/14/2020  What is the name of the medication or equipment? accu chek strips  Have you contacted your pharmacy to request a refill? (if applicable) yes  Which pharmacy would you like this sent to? [facility]   Patient notified that their request is being sent to the clinical staff for review and that they should receive a response within 2 business days.

## 2020-04-19 ENCOUNTER — Ambulatory Visit (INDEPENDENT_AMBULATORY_CARE_PROVIDER_SITE_OTHER): Payer: Medicare Other

## 2020-04-19 DIAGNOSIS — Z0000 Encounter for general adult medical examination without abnormal findings: Secondary | ICD-10-CM | POA: Diagnosis not present

## 2020-04-19 NOTE — Patient Instructions (Signed)
  [name] , Thank you for taking time to come for your Medicare Wellness Visit. I appreciate your ongoing commitment to your health goals. Please review the following plan we discussed and let me know if I can assist you in the future.   These are the goals we discussed: Goals    . Decrease screen time to less then 3 hours per day    . DIET - INCREASE WATER INTAKE     Try to drink 6-8 glasses of water daily.       This is a list of the screening recommended for you and due dates:  Health Maintenance  Topic Date Due  . COVID-19 Vaccine (1) Never done  . Eye exam for diabetics  08/19/2015  . Complete foot exam   12/11/2019  . Flu Shot  02/06/2020  . Hemoglobin A1C  05/24/2020  . Tetanus Vaccine  05/16/2022  . DEXA scan (bone density measurement)  Completed  .  Hepatitis C: One time screening is recommended by Center for Disease Control  (CDC) for  adults born from [birth year] through 1967.   Completed  . Pneumonia vaccines  Completed  . Pap Smear  Discontinued

## 2020-04-19 NOTE — Progress Notes (Signed)
MEDICARE ANNUAL WELLNESS VISIT  04/19/2020  Telephone Visit Disclaimer This Medicare AWV was conducted by telephone due to national recommendations for restrictions regarding the COVID-19 Pandemic (e.g. social distancing).  I verified, using two identifiers, that I am speaking with [name] or their authorized healthcare agent. I discussed the limitations, risks, security, and privacy concerns of performing an evaluation and management service by telephone and the potential availability of an in-person appointment in the future. The patient expressed understanding and agreed to proceed.  Location of Patient: Home Location of Provider (nurse):  [facility]  Subjective:    REBBIE LAURICELLA is a 74 y.o. female patient of Janora Norlander, DO who had a Medicare Annual Wellness Visit today via telephone. Verdine is a very pleasant lady that lives locally here in [place]. She is a widow and lives alone. Her husband passed away 40 years ago. She has two daughters. They help with caring for her and grocery shopping. 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 She doesn't participate in any type of regular exercise routine. She tries to walk as much as she can but states that she doesn't do near as much walking as she used to. She states that her health is about the same as it was this time last year. She has an upcoming appt for her mammogram and also her flu shot. She is considering the Covid vaccine and we discussed today during the visit.   Patient Care Team: Janora Norlander, DO as PCP - General (Family Medicine) Danie Binder, MD (Inactive) as Consulting Physician (Gastroenterology)  Advanced Directives 04/19/2020 04/19/2019 04/14/2018 03/29/2016 09/03/2011 09/03/2011 09/03/2011  Does Patient Have a Medical Advance Directive? No No No No Patient does not have advance directive;Patient would not like information - Patient does not have  advance directive;Patient would like information  Would patient like information on creating a medical advance directive? No - Patient declined No - Patient declined Yes (MAU/Ambulatory/Procedural Areas - Information given) No - patient declined information - - Advance directive packet given  Pre-existing out of facility DNR order (yellow form or pink MOST form) - - - - No No No    Hospital Utilization Over the Past 12 Months: # of hospitalizations or ER visits: 0 # of surgeries: 0  Review of Systems    Patient reports that her overall health is unchanged compared to last year.  History obtained from chart review  Patient Reported Readings (BP, Pulse, CBG, Weight, etc) none  Pain Assessment Pain : No/denies pain     Current Medications & Allergies (verified) Allergies as of 04/19/2020      Reactions   Sulfa Antibiotics    Sulfa Drugs Cross Reactors Rash      Medication List       Accurate as of April 19, 2020 10:00 AM. If you have any questions, ask your nurse or doctor.        Accu-Chek Aviva Plus test strip Generic drug: glucose blood USE TO CHECK GLUCOSE 6 TIMES DAILY Dx E11.9   alendronate 70 MG tablet Commonly known as: FOSAMAX TAKE ONE TABLET BY MOUTH ONCE A WEEK WITH A FULL GLASS OF WATER ON AN EMPTY STOMACH   amLODipine 5 MG tablet Commonly known as: NORVASC Take 1 tablet (5 mg total) by mouth daily.   aspirin EC 81 MG tablet Take 81 mg by mouth daily.   azelastine 0.1 % nasal spray  Commonly known as: ASTELIN Place 1 spray into both nostrils 2 (two) times daily. What changed:   when to take this  reasons to take this   benazepril 40 MG tablet Commonly known as: LOTENSIN Take 1 tablet (40 mg total) by mouth daily.   blood glucose meter kit and supplies Dispense based on patient and insurance preference. Use up to four times daily as directed. (FOR ICD-10 E10.9, E11.9).   fluticasone 50 MCG/ACT nasal spray Commonly known as: FLONASE Place 2  sprays into both nostrils daily.   hydrochlorothiazide 25 MG tablet Commonly known as: HYDRODIURIL Take 1 tablet (25 mg total) by mouth daily.   insulin NPH Human 100 UNIT/ML injection Commonly known as: HumuLIN N INJECT 30 UNITS SUBCUTANEOUSLY IN THE MORNING AND 5 UNITS IN THE EVENING   insulin regular 100 units/mL injection Commonly known as: NOVOLIN R Inject 0.03 mLs (3 Units total) into the skin 3 (three) times daily before meals. What changed: additional instructions   linaclotide 290 MCG Caps capsule Commonly known as: LINZESS Take 1 capsule (290 mcg total) by mouth daily before breakfast.   metFORMIN 500 MG tablet Commonly known as: GLUCOPHAGE Take 2 tablets (1,000 mg total) by mouth 2 (two) times daily with a meal.   metoprolol tartrate 25 MG tablet Commonly known as: LOPRESSOR Take 1 tablet (25 mg total) by mouth 2 (two) times daily.   ondansetron 4 MG tablet Commonly known as: ZOFRAN Take 1 tablet (4 mg total) by mouth every 8 (eight) hours as needed for nausea or vomiting.   pantoprazole 40 MG tablet Commonly known as: PROTONIX Take one tablet before breakfast and one tablet before evening snack.   pravastatin 40 MG tablet Commonly known as: PRAVACHOL Take 1 tablet (40 mg total) by mouth daily.   psyllium 58.6 % powder Commonly known as: METAMUCIL Take 1 packet by mouth daily.   valACYclovir 1000 MG tablet Commonly known as: VALTREX Take 1 tablet (1,000 mg total) by mouth 2 (two) times daily.   VITAMIN D-3 PO Take 2,000 Units by mouth daily.       History (reviewed): Past Medical History:  Diagnosis Date  . Diabetes mellitus   . GERD (gastroesophageal reflux disease)   . Hyperlipidemia   . Hypertension   . Osteoporosis    Past Surgical History:  Procedure Laterality Date  . COLONOSCOPY N/A 03/29/2016   Dr. Oneida Alar: Redundant left colon, nonbleeding internal hemorrhoids.  Consider colonoscopy in 10 to 15 years if benefits outweigh the risk   .  ESOPHAGOGASTRODUODENOSCOPY N/A 03/29/2016   Dr. Oneida Alar: Small hiatal hernia, small sessile polyps found in the gastric fundus, benign fundic gland polyps, mild inflammation and erythema in the gastric antrum.  Duodenum was normal.  Biopsy negative for celiac.  Schatzki ring, patent.  Marland Kitchen TOTAL HIP ARTHROPLASTY  09/03/2011   Procedure: TOTAL HIP ARTHROPLASTY;  Surgeon: Mauri Pole, MD;  Location: [facility];  Service: Orthopedics;  Laterality: Left;   Family History  Problem Relation Age of Onset  . Hypertension Mother   . Hypertension Father   . Diabetes Father   . Hypertension Sister   . Cancer Sister   . Diabetes Brother   . Diabetes Brother   . Colon cancer Neg Hx    Social History   Socioeconomic History  . Marital status: Widowed    Spouse name: Not on file  . Number of children: 2  . Years of education: 4  . Highest education level: High school graduate  Occupational History  . Occupation: retired  Tobacco Use  . Smoking status: Never Smoker  . Smokeless tobacco: Never Used  Vaping Use  . Vaping Use: Never used  Substance and Sexual Activity  . Alcohol use: Yes    Comment: occasional wine  . Drug use: No  . Sexual activity: Not Currently    Birth control/protection: None  Other Topics Concern  . Not on file  Social History Narrative  . Not on file   Social Determinants of Health   Financial Resource Strain:   . Difficulty of Paying Living Expenses: Not on file  Food Insecurity:   . Worried About Charity fundraiser in the Last Year: Not on file  . Ran Out of Food in the Last Year: Not on file  Transportation Needs:   . Lack of Transportation (Medical): Not on file  . Lack of Transportation (Non-Medical): Not on file  Physical Activity:   . Days of Exercise per Week: Not on file  . Minutes of Exercise per Session: Not on file  Stress:   . Feeling of Stress : Not on file  Social Connections:   . Frequency of Communication with Friends and Family: Not on file    . Frequency of Social Gatherings with Friends and Family: Not on file  . Attends Religious Services: Not on file  . Active Member of Clubs or Organizations: Not on file  . Attends Archivist Meetings: Not on file  . Marital Status: Not on file    Activities of Daily Living In your present state of health, do you have any difficulty performing the following activities: 04/19/2020  Hearing? N  Vision? N  Difficulty concentrating or making decisions? N  Walking or climbing stairs? N  Dressing or bathing? N  Doing errands, shopping? N  Preparing Food and eating ? N  Using the Toilet? N  In the past six months, have you accidently leaked urine? N  Do you have problems with loss of bowel control? N  Managing your Medications? N  Managing your Finances? N  Housekeeping or managing your Housekeeping? N  Some recent data might be hidden    Patient Education/ Literacy How often do you need to have someone help you when you read instructions, pamphlets, or other written materials from your doctor or pharmacy?: 1 - Never What is the last grade level you completed in school?: 12th grade  Exercise Current Exercise Habits: The patient does not participate in regular exercise at present, Exercise limited by: None identified  Diet Patient reports consuming 3 meals a day and 2 snack(s) a day Patient reports that her primary diet is: Diabetic Patient reports that she does have regular access to food.   Depression Screen PHQ 2/9 Scores 11/22/2019 08/11/2019 05/19/2019 04/19/2019 02/09/2019 12/11/2018 09/11/2018  PHQ - 2 Score 1 0 0 0 0 0 0  PHQ- 9 Score 6 - - - - 0 -     Fall Risk Fall Risk  04/19/2020 11/22/2019 08/11/2019 05/19/2019 04/19/2019  Falls in the past year? 0 0 0 0 0  Number falls in past yr: - - - - 0  Injury with Fall? - - - - 0  Follow up - - - - Falls prevention discussed  Comment - - - - Get rid of all throw rugs, adequate lighting in the walkways and grab bars in the  bathroom     Objective:  TITILAYO HAGANS seemed alert and oriented and she participated  appropriately during our telephone visit.  Blood Pressure Weight BMI  BP Readings from Last 3 Encounters:  02/16/20 (!) 171/67  11/22/19 (!) 138/56  11/05/19 (!) 162/60   Wt Readings from Last 3 Encounters:  02/16/20 136 lb 12.8 oz (62.1 kg)  11/22/19 138 lb (62.6 kg)  11/05/19 140 lb 3.2 oz (63.6 kg)   BMI Readings from Last 1 Encounters:  02/16/20 23.48 kg/m    *Unable to obtain current vital signs, weight, and BMI due to telephone visit type  Hearing/Vision  . Shakisha did not seem to have difficulty with hearing/understanding during the telephone conversation . Reports that she has had a formal eye exam by an eye care professional within the past year . Reports that she has not had a formal hearing evaluation within the past year *Unable to fully assess hearing and vision during telephone visit type  Cognitive Function: 6CIT Screen 04/19/2020 04/19/2019  What Year? 0 points 0 points  What month? 0 points 0 points  What time? 0 points 0 points  Count back from 20 0 points 0 points  Months in reverse 0 points 0 points  Repeat phrase 0 points 0 points  Total Score 0 0   (Normal:0-7, Significant for Dysfunction: >8)  Normal Cognitive Function Screening: Yes   Immunization & Health Maintenance Record Immunization History  Administered Date(s) Administered  . Fluad Quad(high Dose 65+) 04/21/2019  . Influenza, High Dose Seasonal PF 05/05/2013, 04/08/2017, 04/14/2018  . Influenza,inj,Quad PF,6+ Mos 04/13/2014  . Influenza-Unspecified 04/20/2012  . Pneumococcal Conjugate-13 03/14/2015  . Pneumococcal Polysaccharide-23 04/14/2018  . Td 10/18/1996    Health Maintenance  Topic Date Due  . COVID-19 Vaccine (1) Never done  . OPHTHALMOLOGY EXAM  08/19/2015  . FOOT EXAM  12/11/2019  . INFLUENZA VACCINE  02/06/2020  . HEMOGLOBIN A1C  05/24/2020  . TETANUS/TDAP  05/16/2022  . DEXA  SCAN  Completed  . Hepatitis C Screening  Completed  . PNA vac Low Risk Adult  Completed  . PAP SMEAR-Modifier  Discontinued       Assessment  This is a routine wellness examination for IMOGINE CARVELL.  Health Maintenance: Due or Overdue Health Maintenance Due  Topic Date Due  . COVID-19 Vaccine (1) Never done  . OPHTHALMOLOGY EXAM  08/19/2015  . FOOT EXAM  12/11/2019  . INFLUENZA VACCINE  02/06/2020    [name] does not need a referral for Community Assistance: Care Management:   no Social Work:    no Prescription Assistance:  no Nutrition/Diabetes Education:  no   Plan:  Personalized Goals Goals Addressed   None    Personalized Health Maintenance & Screening Recommendations  Influenza vaccine  Lung Cancer Screening Recommended: no (Low Dose CT Chest recommended if Age 40-80 years, 30 pack-year currently smoking OR have quit w/in past 15 years) Hepatitis C Screening recommended: no HIV Screening recommended: no  Advanced Directives: Written information was not prepared per patient's request.  Referrals & Orders No orders of the defined types were placed in this encounter.   Follow-up Plan . Follow-up with Janora Norlander, DO as planned . Patient already scheduled for flu vaccine    I have personally reviewed and noted the following in the patient's chart:   . Medical and social history . Use of alcohol, tobacco or illicit drugs  . Current medications and supplements . Functional ability and status . Nutritional status . Physical activity . Advanced directives . List of other physicians . Hospitalizations, surgeries, and  ER visits in previous 12 months . Vitals . Screenings to include cognitive, depression, and falls . Referrals and appointments  In addition, I have reviewed and discussed with [name] certain preventive protocols, quality metrics, and best practice recommendations. A written personalized care plan for preventive  services as well as general preventive health recommendations is available and can be mailed to the patient at her request.      Rolena Infante LPN [id number]

## 2020-05-02 DIAGNOSIS — L570 Actinic keratosis: Secondary | ICD-10-CM | POA: Diagnosis not present

## 2020-05-02 DIAGNOSIS — S60521A Blister (nonthermal) of right hand, initial encounter: Secondary | ICD-10-CM | POA: Diagnosis not present

## 2020-05-02 DIAGNOSIS — X32XXXD Exposure to sunlight, subsequent encounter: Secondary | ICD-10-CM | POA: Diagnosis not present

## 2020-05-18 ENCOUNTER — Other Ambulatory Visit: Payer: Self-pay

## 2020-05-18 ENCOUNTER — Encounter: Payer: Self-pay | Admitting: Family Medicine

## 2020-05-18 ENCOUNTER — Ambulatory Visit (INDEPENDENT_AMBULATORY_CARE_PROVIDER_SITE_OTHER): Payer: Medicare Other | Admitting: Family Medicine

## 2020-05-18 ENCOUNTER — Ambulatory Visit (INDEPENDENT_AMBULATORY_CARE_PROVIDER_SITE_OTHER): Payer: Medicare Other

## 2020-05-18 VITALS — BP 171/64 | HR 62 | Temp 98.0°F | Ht 64.0 in | Wt 138.8 lb

## 2020-05-18 DIAGNOSIS — E119 Type 2 diabetes mellitus without complications: Secondary | ICD-10-CM

## 2020-05-18 DIAGNOSIS — I152 Hypertension secondary to endocrine disorders: Secondary | ICD-10-CM

## 2020-05-18 DIAGNOSIS — Z794 Long term (current) use of insulin: Secondary | ICD-10-CM | POA: Diagnosis not present

## 2020-05-18 DIAGNOSIS — Z23 Encounter for immunization: Secondary | ICD-10-CM

## 2020-05-18 DIAGNOSIS — M810 Age-related osteoporosis without current pathological fracture: Secondary | ICD-10-CM

## 2020-05-18 DIAGNOSIS — E785 Hyperlipidemia, unspecified: Secondary | ICD-10-CM

## 2020-05-18 DIAGNOSIS — E1169 Type 2 diabetes mellitus with other specified complication: Secondary | ICD-10-CM

## 2020-05-18 DIAGNOSIS — E1159 Type 2 diabetes mellitus with other circulatory complications: Secondary | ICD-10-CM

## 2020-05-18 LAB — CMP14+EGFR
ALT: 14 IU/L (ref 0–32)
AST: 22 IU/L (ref 0–40)
Albumin/Globulin Ratio: 1.8 (ref 1.2–2.2)
Albumin: 4.5 g/dL (ref 3.7–4.7)
Alkaline Phosphatase: 86 IU/L (ref 44–121)
BUN/Creatinine Ratio: 18 (ref 12–28)
BUN: 14 mg/dL (ref 8–27)
Bilirubin Total: 0.3 mg/dL (ref 0.0–1.2)
CO2: 29 mmol/L (ref 20–29)
Calcium: 10.1 mg/dL (ref 8.7–10.3)
Chloride: 100 mmol/L (ref 96–106)
Creatinine, Ser: 0.77 mg/dL (ref 0.57–1.00)
GFR calc Af Amer: 87 mL/min/{1.73_m2} (ref >59)
GFR calc non Af Amer: 75 mL/min/{1.73_m2} (ref >59)
Globulin, Total: 2.5 g/dL (ref 1.5–4.5)
Glucose: 147 mg/dL — ABNORMAL HIGH (ref 65–99)
Potassium: 4.5 mmol/L (ref 3.5–5.2)
Sodium: 145 mmol/L — ABNORMAL HIGH (ref 134–144)
Total Protein: 7 g/dL (ref 6.0–8.5)

## 2020-05-18 LAB — BAYER DCA HB A1C WAIVED: HB A1C (BAYER DCA - WAIVED): 7.3 % — ABNORMAL HIGH

## 2020-05-18 MED ORDER — ALENDRONATE SODIUM 70 MG PO TABS: ORAL_TABLET | ORAL | 3 refills | Status: DC

## 2020-05-18 MED ORDER — AMLODIPINE BESYLATE 10 MG PO TABS: 10.0000 mg | ORAL_TABLET | Freq: Every day | ORAL | 3 refills | Status: DC

## 2020-05-18 MED ORDER — FREESTYLE LIBRE 14 DAY SENSOR MISC: 1.0000 [IU] | Freq: Every day | 12 refills | Status: DC

## 2020-05-18 MED ORDER — FREESTYLE LIBRE READER DEVI: 1.0000 [IU] | Freq: Every day | 1 refills | Status: DC

## 2020-05-18 NOTE — Patient Instructions (Signed)
Sugar is looking better.  You are very close I'm going to try and get the [employer] ordered for you  You had labs performed today.  You will be contacted with the results of the labs once they are available, usually in the next 3 business days for routine lab work.  If you have an active my chart account, they will be released to your MyChart.  If you prefer to have these labs released to you via telephone, please let us know.  If you had a pap smear or biopsy performed, expect to be contacted in about 7-10 days.

## 2020-05-18 NOTE — Progress Notes (Signed)
Subjective: CC: f/u uncontrolled DM PCP: Janora Norlander, DO IAX:[id number] Susan Petty is a 74 y.o. female presenting to clinic today for:  1. Type 2 Diabetes w/ HTN, HLD:  Patient reports that blood sugars tend to fluctuate still.  Sometimes she will still wake up in the middle the night feeling like she is having a low blood sugar.  She is compliant with her Metformin 1000 mg twice daily.  She takes Novolin in 30 units each morning and 5 units each evening.  She also uses Novolin R if needed at meals up to 3 times daily.  She checks blood sugar frequently because of lability, sometimes up to 6 times daily.  She is compliant with her hydrochlorothiazide, Norvasc, benazepril and metoprolol.  She only takes one of her blood pressure medicines each morning and takes the rest in the afternoon because she worries about taking them all at once.  She is compliant with her statin.  Last eye exam: Had done at [facility].ROI completed again Last foot exam: needs Last A1c:  Lab Results  Component Value Date   HGBA1C 7.8 (H) 11/22/2019   Nephropathy screen indicated?:  Up-to-date Last flu, zoster and/or pneumovax:  Immunization History  Administered Date(s) Administered  . Fluad Quad(high Dose 65+) 04/21/2019  . Influenza, High Dose Seasonal PF 05/05/2013, 04/08/2017, 04/14/2018  . Influenza,inj,Quad PF,6+ Mos 04/13/2014  . Influenza-Unspecified 04/20/2012  . Pneumococcal Conjugate-13 03/14/2015  . Pneumococcal Polysaccharide-23 04/14/2018  . Td 10/18/1996    ROS: No chest pain, shortness of breath, dizziness or falls.  Sometimes she has some right leg pain but she thinks this is due to varicose veins and history of fracture in that right ankle.  2.  Osteoporosis Patient reports compliance with Fosamax each week.  Her last DEXA scan was in 2015.  She has had no follow-up since.  She does suffer from quite a bit of GERD and is on a PPI high dose, twice daily.  She would be interested in alternative  therapies if this would alleviate her acid reflux.  ROS: Per HPI  Allergies  Allergen Reactions  . Sulfa Antibiotics   . Sulfa Drugs Cross Reactors Rash   Past Medical History:  Diagnosis Date  . Diabetes mellitus   . GERD (gastroesophageal reflux disease)   . Hyperlipidemia   . Hypertension   . Osteoporosis     Current Outpatient Medications:  .  alendronate (FOSAMAX) 70 MG tablet, TAKE ONE TABLET BY MOUTH ONCE A WEEK WITH A FULL GLASS OF WATER ON AN EMPTY STOMACH, Disp: 4 tablet, Rfl: 2 .  amLODipine (NORVASC) 5 MG tablet, Take 1 tablet (5 mg total) by mouth daily., Disp: 90 tablet, Rfl: 1 .  aspirin EC 81 MG tablet, Take 81 mg by mouth daily., Disp: , Rfl:  .  azelastine (ASTELIN) 0.1 % nasal spray, Place 1 spray into both nostrils 2 (two) times daily. (Patient taking differently: Place 1 spray into both nostrils as needed. ), Disp: 30 mL, Rfl: 12 .  benazepril (LOTENSIN) 40 MG tablet, Take 1 tablet (40 mg total) by mouth daily., Disp: 90 tablet, Rfl: 1 .  blood glucose meter kit and supplies, Dispense based on patient and insurance preference. Use up to four times daily as directed. (FOR ICD-10 E10.9, E11.9)., Disp: 1 each, Rfl: 0 .  Cholecalciferol (VITAMIN D-3 PO), Take 2,000 Units by mouth daily., Disp: , Rfl:  .  fluticasone (FLONASE) 50 MCG/ACT nasal spray, Place 2 sprays into both nostrils daily., Disp:  16 g, Rfl: 6 .  glucose blood (ACCU-CHEK AVIVA PLUS) test strip, USE TO CHECK GLUCOSE 6 TIMES DAILY Dx E11.9, Disp: 600 each, Rfl: 3 .  hydrochlorothiazide (HYDRODIURIL) 25 MG tablet, Take 1 tablet (25 mg total) by mouth daily., Disp: 90 tablet, Rfl: 1 .  insulin NPH Human (HUMULIN N) 100 UNIT/ML injection, INJECT 30 UNITS SUBCUTANEOUSLY IN THE MORNING AND 5 UNITS IN THE EVENING, Disp: 30 mL, Rfl: 3 .  insulin regular (NOVOLIN R) 100 units/mL injection, Inject 0.03 mLs (3 Units total) into the skin 3 (three) times daily before meals. (Patient taking differently: Inject 3 Units  into the skin 3 (three) times daily before meals. PRN), Disp: 7.5 mL, Rfl: 1 .  linaclotide (LINZESS) 290 MCG CAPS capsule, Take 1 capsule (290 mcg total) by mouth daily before breakfast., Disp: 30 capsule, Rfl: 5 .  metFORMIN (GLUCOPHAGE) 500 MG tablet, Take 2 tablets (1,000 mg total) by mouth 2 (two) times daily with a meal., Disp: 360 tablet, Rfl: 1 .  metoprolol tartrate (LOPRESSOR) 25 MG tablet, Take 1 tablet (25 mg total) by mouth 2 (two) times daily., Disp: 180 tablet, Rfl: 1 .  ondansetron (ZOFRAN) 4 MG tablet, Take 1 tablet (4 mg total) by mouth every 8 (eight) hours as needed for nausea or vomiting., Disp: 30 tablet, Rfl: 1 .  pantoprazole (PROTONIX) 40 MG tablet, Take one tablet before breakfast and one tablet before evening snack., Disp: 60 tablet, Rfl: 5 .  pravastatin (PRAVACHOL) 40 MG tablet, Take 1 tablet (40 mg total) by mouth daily., Disp: 90 tablet, Rfl: 1 .  psyllium (METAMUCIL) 58.6 % powder, Take 1 packet by mouth daily., Disp: , Rfl:  .  valACYclovir (VALTREX) 1000 MG tablet, Take 1 tablet (1,000 mg total) by mouth 2 (two) times daily., Disp: 20 tablet, Rfl: 0 Social History   Socioeconomic History  . Marital status: Widowed    Spouse name: Not on file  . Number of children: 2  . Years of education: 3  . Highest education level: High school graduate  Occupational History  . Occupation: retired  Tobacco Use  . Smoking status: Never Smoker  . Smokeless tobacco: Never Used  Vaping Use  . Vaping Use: Never used  Substance and Sexual Activity  . Alcohol use: Yes    Comment: occasional wine  . Drug use: No  . Sexual activity: Not Currently    Birth control/protection: None  Other Topics Concern  . Not on file  Social History Narrative  . Not on file   Social Determinants of Health   Financial Resource Strain:   . Difficulty of Paying Living Expenses: Not on file  Food Insecurity:   . Worried About Charity fundraiser in the Last Year: Not on file  . Ran Out  of Food in the Last Year: Not on file  Transportation Needs:   . Lack of Transportation (Medical): Not on file  . Lack of Transportation (Non-Medical): Not on file  Physical Activity:   . Days of Exercise per Week: Not on file  . Minutes of Exercise per Session: Not on file  Stress:   . Feeling of Stress : Not on file  Social Connections:   . Frequency of Communication with Friends and Family: Not on file  . Frequency of Social Gatherings with Friends and Family: Not on file  . Attends Religious Services: Not on file  . Active Member of Clubs or Organizations: Not on file  . Attends Club or  Organization Meetings: Not on file  . Marital Status: Not on file  Intimate Partner Violence:   . Fear of Current or Ex-Partner: Not on file  . Emotionally Abused: Not on file  . Physically Abused: Not on file  . Sexually Abused: Not on file   Family History  Problem Relation Age of Onset  . Hypertension Mother   . Hypertension Father   . Diabetes Father   . Hypertension Sister   . Cancer Sister   . Diabetes Brother   . Diabetes Brother   . Colon cancer Neg Hx     Objective: Office vital signs reviewed. BP (!) 171/64   Pulse 62   Temp 98 F (36.7 C)   Ht _0  (1.626 m)   Wt 138 lb 12.8 oz (63 kg)   SpO2 99%   BMI 23.82 kg/m   Physical Examination:  General: Awake, alert, well nourished, No acute distress HEENT: Normal    Eyes: PERRLA, extraocular membranes intact, sclera white    Nose: nasal turbinates moist, clear nasal discharge    Throat: moist mucus membranes, no erythema  Cardio: regular rate and rhythm, S1S2 heard, no murmurs appreciated Pulm: clear to auscultation bilaterally, no wheezes, rhonchi or rales; normal work of breathing on room air Extremities: warm, well perfused, No edema, cyanosis or clubbing; +2 pulses bilaterally Neuro: see DM foot Diabetic Foot Exam - Simple   No data filed      Assessment/ Plan: 74 y.o. female   Type 2 diabetes mellitus with  insulin therapy ([place]) - Plan: Bayer DCA Hb A1c Waived, CMP14+EGFR, Continuous Blood Gluc Receiver (FREESTYLE LIBRE READER) DEVI, Continuous Blood Gluc Sensor (FREESTYLE LIBRE 14 DAY SENSOR) MISC  Hypertension associated with type 2 diabetes mellitus ([place]) - Plan: CMP14+EGFR, amLODipine (NORVASC) 10 MG tablet  Hyperlipidemia associated with type 2 diabetes mellitus ([place]) - Plan: CMP14+EGFR, Lipid Panel, TSH  Age-related osteoporosis without current pathological fracture - Plan: VITAMIN D 25 Hydroxy (Vit-D Deficiency, Fractures), DG [facility] DEXA, alendronate (FOSAMAX) 70 MG tablet  Need for immunization against influenza - Plan: Flu Vaccine QUAD High Dose(Fluad)  Her A1c has improved some to 7.3.  Given lability of sugars, I hesitate to add adjust her medications further.  I am going to see if we can get a freestyle libre coordinated for her.  This has been ordered today.  I encouraged her to follow-up with Almyra Free in the next couple of weeks for further assistance with sugar monitor, etc.  Her blood pressure was not controlled today.  I am going to increase her Norvasc to 10 mg daily and discontinue the hydrochlorothiazide.  We discussed appropriate blood pressure readings and what her goals were.  She will contact me should she have any readings outside of these goals.  We will have a formal blood pressure check with her pharmacy visit in a few weeks.  With regards to her osteoporosis, she is likely due for a drug holiday given diagnosis in 2015 and continued use of the bisphosphonate.  Additionally, I think she would benefit from something outside the bisphosphonate class simply because of her GERD and need for high-dose PPI treatment.  DEXA was ordered today.  She will have this done and we will plan treatment pending this result.  Orders Placed This Encounter  Procedures  . DG [facility] DEXA    Order Specific Question:   Reason for Exam (SYMPTOM  OR DIAGNOSIS REQUIRED)    Answer:   screen osteoporosis   . Flu Vaccine  QUAD High Dose(Fluad)  . Bayer DCA Hb A1c Waived  . CMP14+EGFR  . Lipid Panel  . TSH  . VITAMIN D 25 Hydroxy (Vit-D Deficiency, Fractures)   Meds ordered this encounter  Medications  . alendronate (FOSAMAX) 70 MG tablet    Sig: TAKE ONE TABLET BY MOUTH ONCE A WEEK WITH A FULL GLASS OF WATER ON AN EMPTY STOMACH    Dispense:  12 tablet    Refill:  3    Please consider 90 day supplies to promote better adherence  . Continuous Blood Gluc Receiver (FREESTYLE LIBRE READER) DEVI    Sig: 1 Units by Does not apply route daily. UAD to test BGs daily. Dx E11.9    Dispense:  1 each    Refill:  1  . Continuous Blood Gluc Sensor (FREESTYLE LIBRE 14 DAY SENSOR) MISC    Sig: 1 Units by Does not apply route daily. UAD to check blood sugars E11.9    Dispense:  2 each    Refill:  12  . amLODipine (NORVASC) 10 MG tablet    Sig: Take 1 tablet (10 mg total) by mouth daily. STOP HCTZ    Dispense:  90 tablet    Refill:  [place], [place] [phone]

## 2020-05-19 LAB — LIPID PANEL
Chol/HDL Ratio: 1.9 ratio (ref 0.0–4.4)
Cholesterol, Total: 167 mg/dL (ref 100–199)
HDL: 90 mg/dL (ref >39)
LDL Chol Calc (NIH): 65 mg/dL (ref 0–99)
Triglycerides: 61 mg/dL (ref 0–149)
VLDL Cholesterol Cal: 12 mg/dL (ref 5–40)

## 2020-05-19 LAB — TSH: TSH: 4.99 u[IU]/mL — ABNORMAL HIGH (ref 0.450–4.500)

## 2020-05-19 NOTE — Progress Notes (Signed)
Patient aware and appt made 

## 2020-05-20 LAB — VITAMIN D 25 HYDROXY (VIT D DEFICIENCY, FRACTURES): Vit D, 25-Hydroxy: 59.3 ng/mL (ref 30.0–100.0)

## 2020-05-20 LAB — SPECIMEN STATUS REPORT

## 2020-05-22 ENCOUNTER — Ambulatory Visit (INDEPENDENT_AMBULATORY_CARE_PROVIDER_SITE_OTHER): Payer: Medicare Other | Admitting: Pharmacist

## 2020-05-22 DIAGNOSIS — E119 Type 2 diabetes mellitus without complications: Secondary | ICD-10-CM

## 2020-05-22 MED ORDER — DENOSUMAB 60 MG/ML ~~LOC~~ SOSY: 60.0000 mg | PREFILLED_SYRINGE | Freq: Once | SUBCUTANEOUS | 3 refills | Status: AC

## 2020-05-22 NOTE — Progress Notes (Signed)
     05/22/2020 Name: Susan Petty MRN: [id number] DOB: 1945-10-16   S:  53 yoF Presents for osteoporosis education and management  Current Height:  5'5"      Max Lifetime Height:  5'5" Current Weight:   136lbs    Ethnicity:Caucasian   HPI: Does pt already have a diagnosis of:  Osteopenia?  Yes Osteoporosis?  Yes  Back Pain?  Yes       Kyphosis?  No Prior fracture?  Yes hip--History or hip replacement over 10 years        Med(s) for Osteoporosis/Osteopenia:  Fosamax generic, calcium, vitamin D  Med(s) previously tried for Osteoporosis/Osteopenia:  n/a                                                     PMH: Age at menopause:  65s Hysterectomy? Yes, past-unsure of duration Oophorectomy?  No HRT? No Steroid Use?  No Thyroid med?  No History of cancer?  Yes, skin (removed) History of digestive disorders (ie Crohn's)?  Yes GERD on PPI Current or previous eating disorders?  No Last Vitamin D Result:  59.3 (05/18/20) Last GFR Result:  75    FH/SH: Family history of osteoporosis?  No Parent with history of hip fracture?  No Family history of breast cancer?  No Exercise?  Yes does exercises  Smoking?  No Alcohol?  No    Calcium Assessment Calcium Intake  # of servings/day  Calcium mg  Milk (8 oz) 0  x  300  = 0  Yogurt (4 oz) 1 x  200 = 200  Cheese (1 oz) 1 x  200 = 200  Other Calcium sources   250mg   Ca supplement 1-2 = [phone]   Estimated calcium intake per day 1250-1850   Assessment: Patient's diagnostic category is OSTEOPOROSIS by WHO Criteria.  FINDINGS: LEFT FOREARM (1/3 RADIUS) Bone Mineral Density (BMD):  0.521 Young Adult T Score:  -4.1 Z Score:  -1.7  RIGHT FEMUR TOTAL Bone Mineral Density (BMD):  0.679 g/cm2 Young Adult T-Score: -2.6 Z-Score:  -0.7  FRACTURE RISK: INCREASED  COMPARISON: 04/13/2014. Since the prior study there has been no significant change in density for the proximal right femur.  Recommendations: 1.  Consider Prolia  --patient has been on bisphosphonate with little to no improvement.  Will explore insurance and benefits to see if cost effective; RX sent to provider 2.  continue calcium 1200mg  daily through supplementation or diet.  3.  continue weight bearing exercise - 30 minutes at least 4 days per week.   4.  Counseled and educated about fall risk and prevention.  Recheck DEXA:  2 years  Time spent counseling patient:  15 minutes  Regina Eck, PharmD, BCPS Clinical Pharmacist, [place]  II Phone [phone]

## 2020-05-31 ENCOUNTER — Telehealth: Payer: Self-pay | Admitting: Pharmacist

## 2020-05-31 ENCOUNTER — Encounter: Payer: Self-pay | Admitting: Internal Medicine

## 2020-05-31 MED ORDER — DENOSUMAB 60 MG/ML ~~LOC~~ SOSY: 60.0000 mg | PREFILLED_SYRINGE | Freq: Once | SUBCUTANEOUS | 3 refills | Status: AC

## 2020-05-31 NOTE — Telephone Encounter (Signed)
New Start Prolia  RX sent  Follow up for PA

## 2020-06-05 ENCOUNTER — Other Ambulatory Visit: Payer: Self-pay | Admitting: Gastroenterology

## 2020-06-08 NOTE — Telephone Encounter (Signed)
Pt aware that it will be first of year 2022 when we start Prolia. Certification process started

## 2020-06-12 ENCOUNTER — Encounter: Payer: Self-pay | Admitting: Family Medicine

## 2020-06-12 ENCOUNTER — Ambulatory Visit (INDEPENDENT_AMBULATORY_CARE_PROVIDER_SITE_OTHER): Payer: Medicare Other | Admitting: Family Medicine

## 2020-06-12 DIAGNOSIS — J069 Acute upper respiratory infection, unspecified: Secondary | ICD-10-CM

## 2020-06-12 DIAGNOSIS — H9203 Otalgia, bilateral: Secondary | ICD-10-CM

## 2020-06-12 MED ORDER — AMOXICILLIN-POT CLAVULANATE 875-125 MG PO TABS: 1.0000 | ORAL_TABLET | Freq: Two times a day (BID) | ORAL | 0 refills | Status: DC

## 2020-06-12 MED ORDER — BENZONATATE 100 MG PO CAPS: 100.0000 mg | ORAL_CAPSULE | Freq: Three times a day (TID) | ORAL | 0 refills | Status: DC | PRN

## 2020-06-12 NOTE — Progress Notes (Signed)
Telephone visit  Subjective: CC: URi PCP: Janora Norlander, DO OEH:[id number] Susan Petty is a 74 y.o. female calls for telephone consult today. Patient provides verbal consent for consult held via phone.  Due to COVID-19 pandemic this visit was conducted virtually. This visit type was conducted due to national recommendations for restrictions regarding the COVID-19 Pandemic (e.g. social distancing, sheltering in place) in an effort to limit this patient's exposure and mitigate transmission in our community. All issues noted in this document were discussed and addressed.  A physical exam was not performed with this format.   Location of patient: home Location of provider: [facility] Others present for call: none  1. URI Onset of symptoms last Wednesday.  She reports nausea, bilateral earache, and cough.  BGs have been running high.  She denies diarrhea.  She is using Tylenol cough/ flu. She reports intermittent subjective fever.  No mylagia.  No known sick contacts.   ROS: Per HPI  Allergies  Allergen Reactions  . Sulfa Antibiotics   . Sulfa Drugs Cross Reactors Rash   Past Medical History:  Diagnosis Date  . Diabetes mellitus   . GERD (gastroesophageal reflux disease)   . Hyperlipidemia   . Hypertension   . Osteoporosis     Current Outpatient Medications:  .  alendronate (FOSAMAX) 70 MG tablet, TAKE ONE TABLET BY MOUTH ONCE A WEEK WITH A FULL GLASS OF WATER ON AN EMPTY STOMACH, Disp: 12 tablet, Rfl: 3 .  amLODipine (NORVASC) 10 MG tablet, Take 1 tablet (10 mg total) by mouth daily. STOP HCTZ, Disp: 90 tablet, Rfl: 3 .  aspirin EC 81 MG tablet, Take 81 mg by mouth daily., Disp: , Rfl:  .  azelastine (ASTELIN) 0.1 % nasal spray, Place 1 spray into both nostrils 2 (two) times daily. (Patient taking differently: Place 1 spray into both nostrils as needed. ), Disp: 30 mL, Rfl: 12 .  benazepril (LOTENSIN) 40 MG tablet, Take 1 tablet (40 mg total) by mouth daily., Disp: 90 tablet, Rfl: 1 .   blood glucose meter kit and supplies, Dispense based on patient and insurance preference. Use up to four times daily as directed. (FOR ICD-10 E10.9, E11.9)., Disp: 1 each, Rfl: 0 .  Cholecalciferol (VITAMIN D-3 PO), Take 2,000 Units by mouth daily., Disp: , Rfl:  .  Continuous Blood Gluc Receiver (FREESTYLE LIBRE READER) DEVI, 1 Units by Does not apply route daily. UAD to test BGs daily. Dx E11.9, Disp: 1 each, Rfl: 1 .  Continuous Blood Gluc Sensor (FREESTYLE LIBRE 14 DAY SENSOR) MISC, 1 Units by Does not apply route daily. UAD to check blood sugars E11.9, Disp: 2 each, Rfl: 12 .  fluticasone (FLONASE) 50 MCG/ACT nasal spray, Place 2 sprays into both nostrils daily., Disp: 16 g, Rfl: 6 .  glucose blood (ACCU-CHEK AVIVA PLUS) test strip, USE TO CHECK GLUCOSE 6 TIMES DAILY Dx E11.9, Disp: 600 each, Rfl: 3 .  insulin NPH Human (HUMULIN N) 100 UNIT/ML injection, INJECT 30 UNITS SUBCUTANEOUSLY IN THE MORNING AND 5 UNITS IN THE EVENING, Disp: 30 mL, Rfl: 3 .  insulin regular (NOVOLIN R) 100 units/mL injection, Inject 0.03 mLs (3 Units total) into the skin 3 (three) times daily before meals. (Patient taking differently: Inject 3 Units into the skin 3 (three) times daily before meals. PRN), Disp: 7.5 mL, Rfl: 1 .  LINZESS 290 MCG CAPS capsule, TAKE 1 CAPSULE BY MOUTH ONCE DAILY BEFORE BREAKFAST, Disp: 30 capsule, Rfl: 0 .  metFORMIN (GLUCOPHAGE) 500 MG tablet,  Take 2 tablets (1,000 mg total) by mouth 2 (two) times daily with a meal., Disp: 360 tablet, Rfl: 1 .  metoprolol tartrate (LOPRESSOR) 25 MG tablet, Take 1 tablet (25 mg total) by mouth 2 (two) times daily., Disp: 180 tablet, Rfl: 1 .  ondansetron (ZOFRAN) 4 MG tablet, Take 1 tablet (4 mg total) by mouth every 8 (eight) hours as needed for nausea or vomiting., Disp: 30 tablet, Rfl: 1 .  pantoprazole (PROTONIX) 40 MG tablet, Take one tablet before breakfast and one tablet before evening snack., Disp: 60 tablet, Rfl: 5 .  pravastatin (PRAVACHOL) 40 MG  tablet, Take 1 tablet (40 mg total) by mouth daily., Disp: 90 tablet, Rfl: 1 .  psyllium (METAMUCIL) 58.6 % powder, Take 1 packet by mouth daily., Disp: , Rfl:  .  valACYclovir (VALTREX) 1000 MG tablet, Take 1 tablet (1,000 mg total) by mouth 2 (two) times daily., Disp: 20 tablet, Rfl: 0  Assessment/ Plan: 74 y.o. female   URI with cough and congestion - Plan: amoxicillin-clavulanate (AUGMENTIN) 875-125 MG tablet, benzonatate (TESSALON PERLES) 100 MG capsule  Otalgia of both ears - Plan: amoxicillin-clavulanate (AUGMENTIN) 875-125 MG tablet  I think this is likely viral.  I have given her a pocket prescription for Augmentin to have if symptoms are not improving or if they abruptly worsen.  We discussed Covid testing but she declined this.  Tessalon Perles has been sent for symptomatic relief of her cough.  Home care instructions reviewed and reasons for return discussed.  She will follow-up as needed  Start time: 1:59pm End time: 2:04pm  Total time spent on patient care (including telephone call/ virtual visit): 5 minutes  [place], [place] [phone]

## 2020-06-22 IMAGING — DX DG ABDOMEN 2V
2 series · 2 of 2 positions shown · non-contrast
Comparison: None.

CLINICAL DATA: Abdominal bloating and constipation

EXAM:
ABDOMEN - 2 VIEW

[abdomen erect]
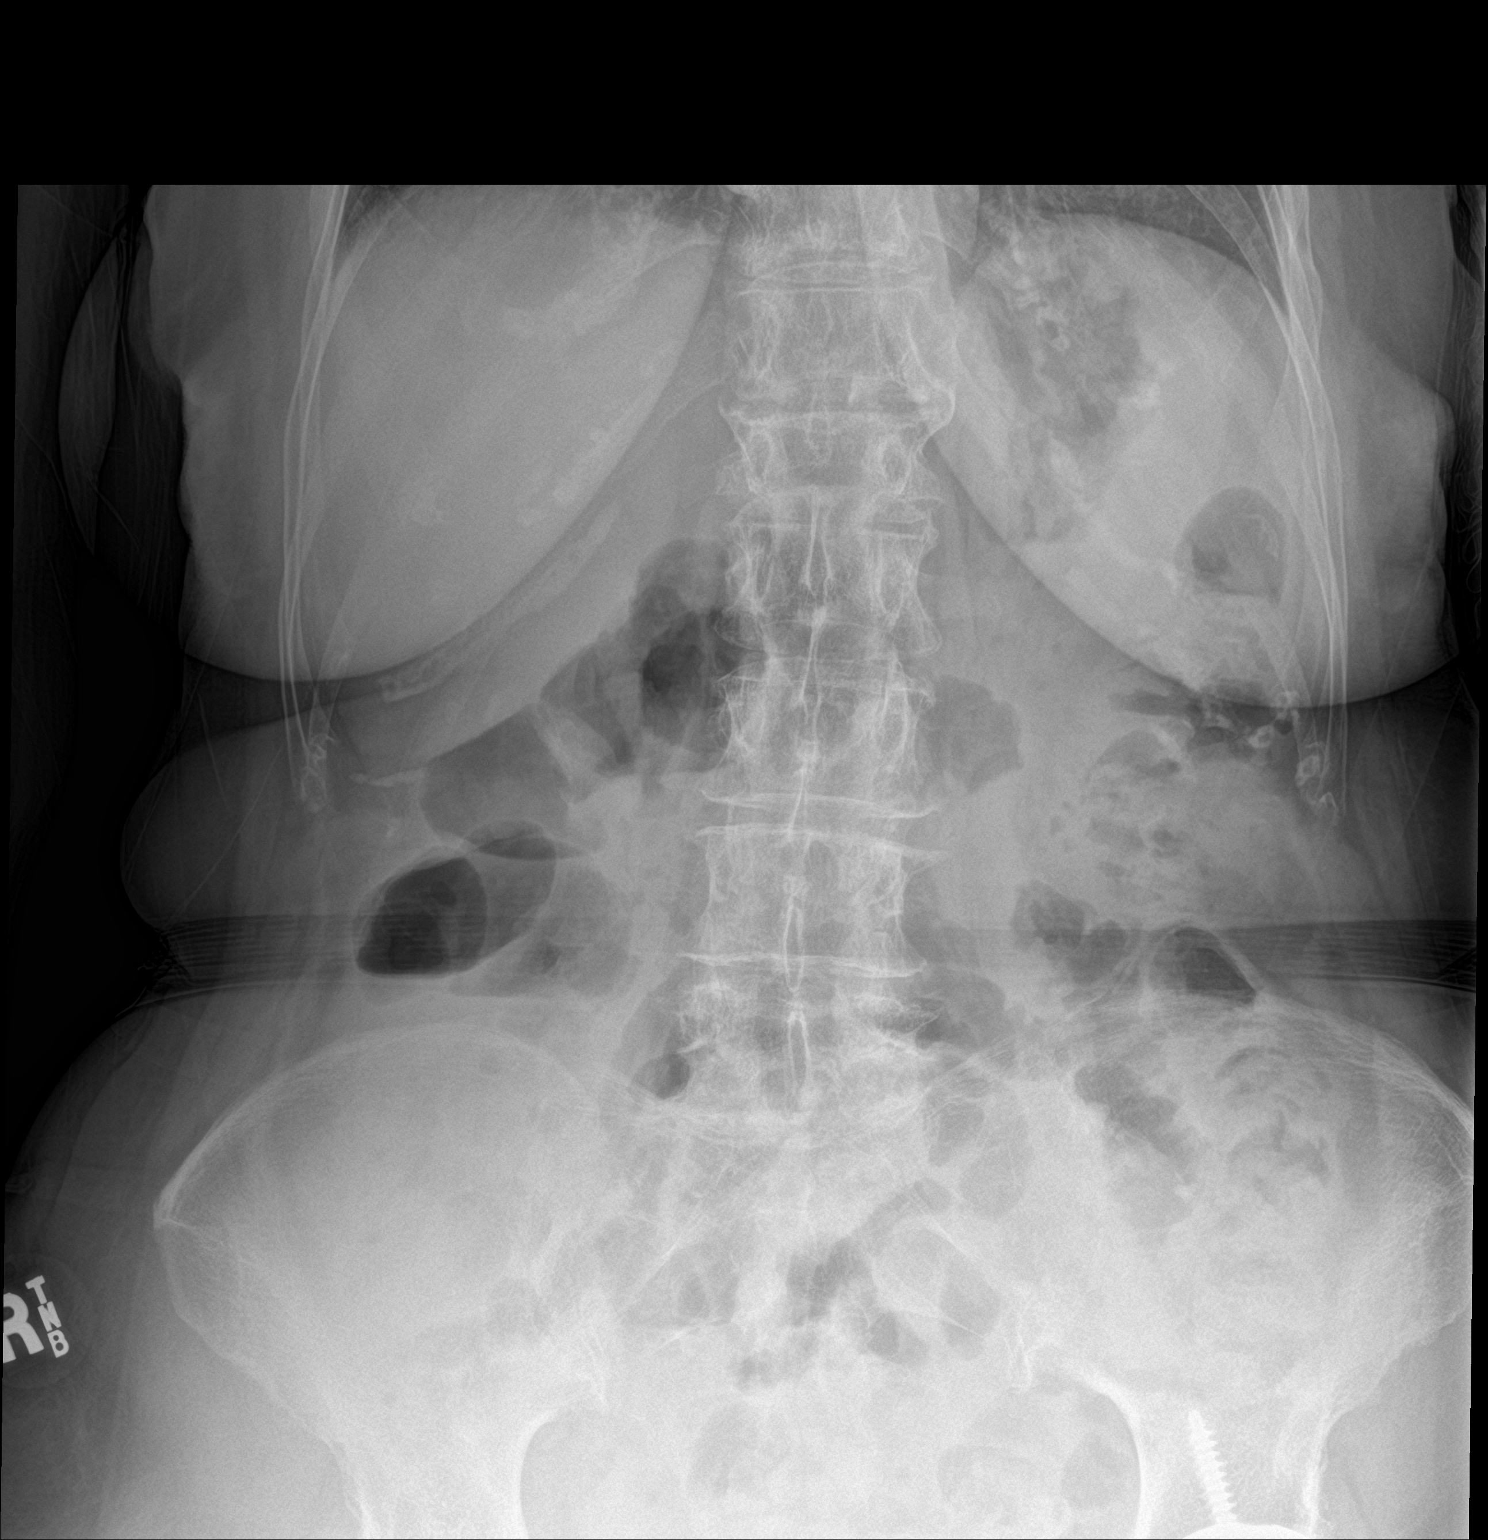

[abdomen supine]
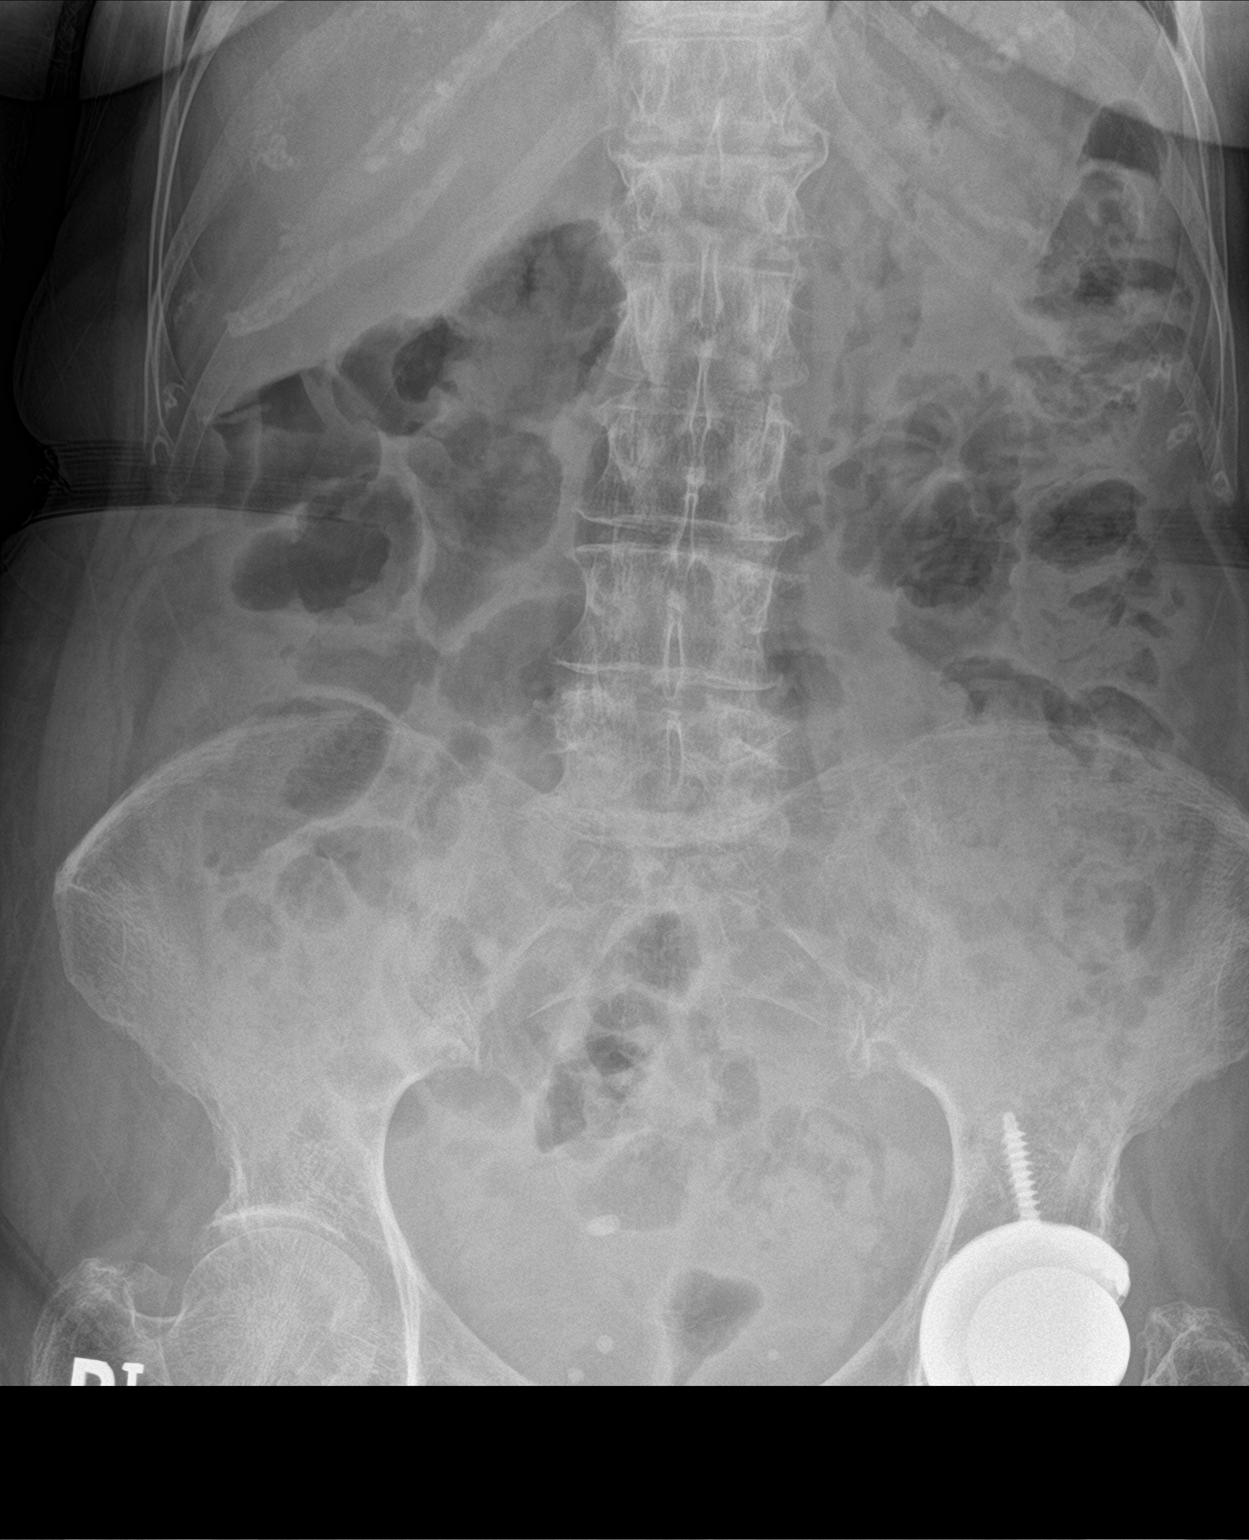

[2 of 2 positions shown; findings below may reference images not displayed]

FINDINGS: There is moderate stool in the colon. There is no bowel dilatation
or air-fluid level to suggest bowel obstruction. No free air.
Patient is status post total hip replacement on the left. Lung bases
clear.
IMPRESSION: Moderate volume of stool in colon. No bowel obstruction or free air
evident.

## 2020-07-05 ENCOUNTER — Ambulatory Visit
Admission: RE | Admit: 2020-07-05 | Discharge: 2020-07-05 | Disposition: A | Discharge status: Home / Self Care | Payer: Medicare Other | Source: Ambulatory Visit | Attending: Family Medicine | Admitting: Family Medicine

## 2020-07-05 ENCOUNTER — Other Ambulatory Visit: Payer: Self-pay

## 2020-07-05 DIAGNOSIS — Z1231 Encounter for screening mammogram for malignant neoplasm of breast: Secondary | ICD-10-CM

## 2020-07-06 ENCOUNTER — Other Ambulatory Visit: Payer: Self-pay | Admitting: Family Medicine

## 2020-07-06 ENCOUNTER — Other Ambulatory Visit: Payer: Self-pay | Admitting: Nurse Practitioner

## 2020-07-06 DIAGNOSIS — E785 Hyperlipidemia, unspecified: Secondary | ICD-10-CM

## 2020-07-06 DIAGNOSIS — E1159 Type 2 diabetes mellitus with other circulatory complications: Secondary | ICD-10-CM

## 2020-07-11 ENCOUNTER — Other Ambulatory Visit: Payer: Self-pay | Admitting: Family Medicine

## 2020-07-11 DIAGNOSIS — R928 Other abnormal and inconclusive findings on diagnostic imaging of breast: Secondary | ICD-10-CM

## 2020-07-13 ENCOUNTER — Other Ambulatory Visit: Payer: Self-pay | Admitting: *Deleted

## 2020-07-13 DIAGNOSIS — Z794 Long term (current) use of insulin: Secondary | ICD-10-CM

## 2020-07-13 DIAGNOSIS — E119 Type 2 diabetes mellitus without complications: Secondary | ICD-10-CM

## 2020-07-13 MED ORDER — INSULIN NPH (HUMAN) (ISOPHANE) 100 UNIT/ML ~~LOC~~ SUSP: SUBCUTANEOUS | 0 refills | Status: DC

## 2020-08-02 DIAGNOSIS — X32XXXD Exposure to sunlight, subsequent encounter: Secondary | ICD-10-CM | POA: Diagnosis not present

## 2020-08-02 DIAGNOSIS — D225 Melanocytic nevi of trunk: Secondary | ICD-10-CM | POA: Diagnosis not present

## 2020-08-02 DIAGNOSIS — L570 Actinic keratosis: Secondary | ICD-10-CM | POA: Diagnosis not present

## 2020-08-15 ENCOUNTER — Other Ambulatory Visit: Payer: Medicare Other

## 2020-08-15 ENCOUNTER — Ambulatory Visit (INDEPENDENT_AMBULATORY_CARE_PROVIDER_SITE_OTHER): Payer: Medicare Other | Admitting: Nurse Practitioner

## 2020-08-15 ENCOUNTER — Encounter: Payer: Self-pay | Admitting: Nurse Practitioner

## 2020-08-15 DIAGNOSIS — R300 Dysuria: Secondary | ICD-10-CM | POA: Insufficient documentation

## 2020-08-15 MED ORDER — CEPHALEXIN 500 MG PO CAPS: 500.0000 mg | ORAL_CAPSULE | Freq: Two times a day (BID) | ORAL | 0 refills | Status: DC

## 2020-08-15 NOTE — Progress Notes (Signed)
   Virtual Visit via telephone Note Due to COVID-19 pandemic this visit was conducted virtually. This visit type was conducted due to national recommendations for restrictions regarding the COVID-19 Pandemic (e.g. social distancing, sheltering in place) in an effort to limit this patient's exposure and mitigate transmission in our community. All issues noted in this document were discussed and addressed.  A physical exam was not performed with this format.  I connected with Susan Petty on 08/15/20 at  11:52 AM by telephone and verified that I am speaking with the correct person using two identifiers. Susan Petty is currently located at home during visit. The provider, Ivy Lynn, NP is located in their office at time of visit.  I discussed the limitations, risks, security and privacy concerns of performing an evaluation and management service by telephone and the availability of in person appointments. I also discussed with the patient that there may be a patient responsible charge related to this service. The patient expressed understanding and agreed to proceed.   History and Present Illness:  Dysuria  This is a new problem. Episode onset: In the past 3 days. The problem occurs every urination. The problem has been gradually worsening. The quality of the pain is described as aching. The pain is moderate. There has been no fever. She is not sexually active. Associated symptoms include urgency. Pertinent negatives include no chills, flank pain, hematuria or nausea. She has tried nothing for the symptoms. There is no history of catheterization.      Review of Systems  Constitutional: Negative for chills.  HENT: Negative.   Gastrointestinal: Negative for nausea.  Genitourinary: Positive for dysuria and urgency. Negative for flank pain and hematuria.  Skin: Negative.   All other systems reviewed and are negative.    Observations/Objective: Telephone visit.  Patient does not  sound to be in distress  Assessment and Plan: Dysuria Patient is reporting new UTI symptoms in the last 3 days with worsening burning with every urination, foul urine odor.  Patient denies nausea, abdominal pain, fever and or CVA tenderness.  Encourage patient to increase hydration.  Patient is unable able to go out to clinic to leave urine sample for urinalysis and culture. I started patient on Keflex 500 mg twice daily for 7 days. Advised patient to follow-up with worsening or unresolved symptoms.  Patient verbalized understanding. Rx sent to pharmacy.  Follow Up Instructions:  Follow-up with worsening or unresolved symptoms.   I discussed the assessment and treatment plan with the patient. The patient was provided an opportunity to ask questions and all were answered. The patient agreed with the plan and demonstrated an understanding of the instructions.   The patient was advised to call back or seek an in-person evaluation if the symptoms worsen or if the condition fails to improve as anticipated.  The above assessment and management plan was discussed with the patient. The patient verbalized understanding of and has agreed to the management plan. Patient is aware to call the clinic if symptoms persist or worsen. Patient is aware when to return to the clinic for a follow-up visit. Patient educated on when it is appropriate to go to the emergency department.   Time call ended: 12 PM   I provided 8 minutes of non-face-to-face time during this encounter.    Ivy Lynn, NP

## 2020-08-15 NOTE — Assessment & Plan Note (Signed)
Patient is reporting new UTI symptoms in the last 3 days with worsening burning with every urination, foul urine odor.  Patient denies nausea, abdominal pain, fever and or CVA tenderness.  Encourage patient to increase hydration.  Patient is unable able to go out to clinic to leave urine sample for urinalysis and culture. I started patient on Keflex 500 mg twice daily for 7 days. Advised patient to follow-up with worsening or unresolved symptoms.  Patient verbalized understanding. Rx sent to pharmacy.

## 2020-08-22 ENCOUNTER — Encounter: Payer: Self-pay | Admitting: Family Medicine

## 2020-08-22 ENCOUNTER — Ambulatory Visit (INDEPENDENT_AMBULATORY_CARE_PROVIDER_SITE_OTHER): Payer: Medicare Other | Admitting: Family Medicine

## 2020-08-22 DIAGNOSIS — A6009 Herpesviral infection of other urogenital tract: Secondary | ICD-10-CM

## 2020-08-22 MED ORDER — VALACYCLOVIR HCL 1 G PO TABS: 1000.0000 mg | ORAL_TABLET | Freq: Two times a day (BID) | ORAL | 0 refills | Status: DC

## 2020-08-22 NOTE — Progress Notes (Signed)
Subjective:    Patient ID: Susan Petty, female    DOB: 12/30/1945, 75 y.o.   MRN: [id number]   HPI: Susan Petty is a 75 y.o. female presenting for 1 in. To 2 in on perineum  that the skin came off. Red around it. Sitting in epsom salts. Onset 2 weeks ago. Cephalexin didn't help. Sugar has been going up. Burns when she urinates. Wants to try something else. Has had bumps like this before.   Depression screen [facility] 2/9 05/18/2020 11/22/2019 08/11/2019 05/19/2019 04/19/2019  Decreased Interest 0 0 0 0 0  Down, Depressed, Hopeless 0 1 0 0 0  PHQ - 2 Score 0 1 0 0 0  Altered sleeping - 2 - - -  Tired, decreased energy - 2 - - -  Change in appetite - 1 - - -  Feeling bad or failure about yourself  - 0 - - -  Trouble concentrating - 0 - - -  Moving slowly or fidgety/restless - 0 - - -  Suicidal thoughts - 0 - - -  PHQ-9 Score - 6 - - -  Difficult doing work/chores - Somewhat difficult - - -  Some recent data might be hidden     Relevant past medical, surgical, family and social history reviewed and updated as indicated.  Interim medical history since our last visit reviewed. Allergies and medications reviewed and updated.  ROS:  Review of Systems  Constitutional: Negative for fever.  Skin: Positive for rash and wound.     Social History   Tobacco Use  Smoking Status Never Smoker  Smokeless Tobacco Never Used       Objective:     Wt Readings from Last 3 Encounters:  05/18/20 138 lb 12.8 oz (63 kg)  02/16/20 136 lb 12.8 oz (62.1 kg)  11/22/19 138 lb (62.6 kg)     Exam deferred. Pt. Harboring due to COVID 19. Phone visit performed.   Assessment & Plan:   1. Herpes genitalis in women     Meds ordered this encounter  Medications  . valACYclovir (VALTREX) 1000 MG tablet    Sig: Take 1 tablet (1,000 mg total) by mouth 2 (two) times daily.    Dispense:  20 tablet    Refill:  0    No orders of the defined types were placed in this encounter.     Diagnoses  and all orders for this visit:  Herpes genitalis in women  Other orders -     valACYclovir (VALTREX) 1000 MG tablet; Take 1 tablet (1,000 mg total) by mouth 2 (two) times daily.    Virtual Visit via telephone Note  I discussed the limitations, risks, security and privacy concerns of performing an evaluation and management service by telephone and the availability of in person appointments. The patient was identified with two identifiers. Pt.expressed understanding and agreed to proceed. Pt. Is at home. Dr. Livia Snellen is in his office.  Follow Up Instructions:   I discussed the assessment and treatment plan with the patient. The patient was provided an opportunity to ask questions and all were answered. The patient agreed with the plan and demonstrated an understanding of the instructions.   The patient was advised to call back or seek an in-person evaluation if the symptoms worsen or if the condition fails to improve as anticipated.   Total minutes including chart review and phone contact time: 12   Follow up plan: Return if symptoms worsen or fail to improve,  for Also has a physical next week. Cna be monitored then as well.Claretta Fraise, MD [place]

## 2020-08-28 ENCOUNTER — Encounter: Payer: Self-pay | Admitting: Family Medicine

## 2020-08-28 ENCOUNTER — Ambulatory Visit (INDEPENDENT_AMBULATORY_CARE_PROVIDER_SITE_OTHER): Payer: Medicare Other | Admitting: Family Medicine

## 2020-08-28 ENCOUNTER — Other Ambulatory Visit: Payer: Self-pay

## 2020-08-28 VITALS — BP 142/55 | HR 65 | Temp 97.7°F | Ht 64.0 in | Wt 141.0 lb

## 2020-08-28 DIAGNOSIS — Z794 Long term (current) use of insulin: Secondary | ICD-10-CM | POA: Diagnosis not present

## 2020-08-28 DIAGNOSIS — I152 Hypertension secondary to endocrine disorders: Secondary | ICD-10-CM | POA: Diagnosis not present

## 2020-08-28 DIAGNOSIS — E119 Type 2 diabetes mellitus without complications: Secondary | ICD-10-CM

## 2020-08-28 DIAGNOSIS — F419 Anxiety disorder, unspecified: Secondary | ICD-10-CM

## 2020-08-28 DIAGNOSIS — R7989 Other specified abnormal findings of blood chemistry: Secondary | ICD-10-CM | POA: Diagnosis not present

## 2020-08-28 DIAGNOSIS — R300 Dysuria: Secondary | ICD-10-CM | POA: Diagnosis not present

## 2020-08-28 DIAGNOSIS — E1159 Type 2 diabetes mellitus with other circulatory complications: Secondary | ICD-10-CM

## 2020-08-28 LAB — MICROSCOPIC EXAMINATION
Bacteria, UA: NONE SEEN
Epithelial Cells (non renal): NONE SEEN /hpf (ref 0–10)
RBC, Urine: NONE SEEN /hpf (ref 0–2)
WBC, UA: NONE SEEN /hpf (ref 0–5)

## 2020-08-28 LAB — URINALYSIS, COMPLETE
Bilirubin, UA: NEGATIVE
Glucose, UA: NEGATIVE
Ketones, UA: NEGATIVE
Leukocytes,UA: NEGATIVE
Nitrite, UA: NEGATIVE
Protein,UA: NEGATIVE
RBC, UA: NEGATIVE
Specific Gravity, UA: <1.005 — ABNORMAL LOW (ref 1.005–1.030)
Urobilinogen, Ur: 0.2 mg/dL (ref 0.2–1.0)
pH, UA: 5.5 (ref 5.0–7.5)

## 2020-08-28 LAB — BAYER DCA HB A1C WAIVED: HB A1C (BAYER DCA - WAIVED): 8.2 % — ABNORMAL HIGH (ref <7.0)

## 2020-08-28 MED ORDER — FREESTYLE LIBRE READER DEVI: 1.0000 [IU] | Freq: Every day | 99 refills | Status: DC

## 2020-08-28 MED ORDER — FREESTYLE LIBRE 14 DAY SENSOR MISC: 1.0000 [IU] | Freq: Every day | 12 refills | Status: DC

## 2020-08-28 MED ORDER — FLUOXETINE HCL 10 MG PO CAPS: 10.0000 mg | ORAL_CAPSULE | Freq: Every day | ORAL | 3 refills | Status: DC

## 2020-08-28 NOTE — Progress Notes (Signed)
Subjective: CC: DM PCP: Janora Norlander, DO JGG:EZMOQH Susan Petty is a 75 y.o. female presenting to clinic today for:  1. Type 2 Diabetes with hypertension, hyperlipidemia:  Patient is compliant with her medications.  Currently injecting 25 units each morning and 9 to 10 units of Humulin in each evening.  She has been using her Novolin R a little bit more frequently than normal, typically 2 to 3 units with meals.  She is also compliant with her Metformin, Pravachol, Norvasc, metoprolol.  She is not using the freestyle [name] but is very interested in going back to it.  Currently being treated for urinary tract infection.   Last eye exam: Needs Last foot exam: UTD Last A1c:  Lab Results  Component Value Date   HGBA1C 7.3 (H) 05/18/2020   Nephropathy screen indicated?:  Up-to-date Last flu, zoster and/or pneumovax:  Immunization History  Administered Date(s) Administered  . Fluad Quad(high Dose 65+) 04/21/2019, 05/18/2020  . Influenza, High Dose Seasonal PF 05/05/2013, 04/08/2017, 04/14/2018  . Influenza,inj,Quad PF,6+ Mos 04/13/2014  . Influenza-Unspecified 04/20/2012  . Pneumococcal Conjugate-13 03/14/2015  . Pneumococcal Polysaccharide-23 04/14/2018  . Td 10/18/1996    2.  Anxiety Patient reports increased anxiety and panic.  She is not been on medications for this in the past but is interested in starting something.  Mild depressive symptoms as well.   ROS: Per HPI  Allergies  Allergen Reactions  . Sulfa Antibiotics   . Sulfa Drugs Cross Reactors Rash   Past Medical History:  Diagnosis Date  . Diabetes mellitus   . GERD (gastroesophageal reflux disease)   . Hyperlipidemia   . Hypertension   . Osteoporosis     Current Outpatient Medications:  .  alendronate (FOSAMAX) 70 MG tablet, TAKE ONE TABLET BY MOUTH ONCE A WEEK WITH A FULL GLASS OF WATER ON AN EMPTY STOMACH, Disp: 12 tablet, Rfl: 3 .  amLODipine (NORVASC) 10 MG tablet, Take 1 tablet (10 mg total) by  mouth daily. STOP HCTZ, Disp: 90 tablet, Rfl: 3 .  aspirin EC 81 MG tablet, Take 81 mg by mouth daily., Disp: , Rfl:  .  azelastine (ASTELIN) 0.1 % nasal spray, Place 1 spray into both nostrils 2 (two) times daily. (Patient taking differently: Place 1 spray into both nostrils as needed. ), Disp: 30 mL, Rfl: 12 .  benazepril (LOTENSIN) 40 MG tablet, Take 1 tablet by mouth once daily, Disp: 90 tablet, Rfl: 0 .  blood glucose meter kit and supplies, Dispense based on patient and insurance preference. Use up to four times daily as directed. (FOR ICD-10 E10.9, E11.9)., Disp: 1 each, Rfl: 0 .  Cholecalciferol (VITAMIN D-3 PO), Take 2,000 Units by mouth daily., Disp: , Rfl:  .  Continuous Blood Gluc Receiver (FREESTYLE LIBRE READER) DEVI, 1 Units by Does not apply route daily. UAD to test BGs daily. Dx E11.9, Disp: 1 each, Rfl: 1 .  Continuous Blood Gluc Sensor (FREESTYLE LIBRE 14 DAY SENSOR) MISC, 1 Units by Does not apply route daily. UAD to check blood sugars E11.9, Disp: 2 each, Rfl: 12 .  fluticasone (FLONASE) 50 MCG/ACT nasal spray, Place 2 sprays into both nostrils daily., Disp: 16 g, Rfl: 6 .  glucose blood (ACCU-CHEK AVIVA PLUS) test strip, USE TO CHECK GLUCOSE 6 TIMES DAILY Dx E11.9, Disp: 600 each, Rfl: 3 .  insulin NPH Human (HUMULIN N) 100 UNIT/ML injection, Inject 0.3 mLs (30 Units total) into the skin in the morning AND 0.05 mLs (5 Units total)  every evening., Disp: 10 mL, Rfl: 0 .  insulin regular (NOVOLIN R) 100 units/mL injection, Inject 0.03 mLs (3 Units total) into the skin 3 (three) times daily before meals. (Patient taking differently: Inject 3 Units into the skin 3 (three) times daily before meals. PRN), Disp: 7.5 mL, Rfl: 1 .  LINZESS 290 MCG CAPS capsule, TAKE 1 CAPSULE BY MOUTH ONCE DAILY BEFORE BREAKFAST, Disp: 30 capsule, Rfl: 11 .  metFORMIN (GLUCOPHAGE) 500 MG tablet, Take 2 tablets (1,000 mg total) by mouth 2 (two) times daily with a meal., Disp: 360 tablet, Rfl: 1 .  metoprolol  tartrate (LOPRESSOR) 25 MG tablet, Take 1 tablet by mouth twice daily, Disp: 180 tablet, Rfl: 0 .  ondansetron (ZOFRAN) 4 MG tablet, Take 1 tablet (4 mg total) by mouth every 8 (eight) hours as needed for nausea or vomiting., Disp: 30 tablet, Rfl: 1 .  pantoprazole (PROTONIX) 40 MG tablet, Take one tablet before breakfast and one tablet before evening snack., Disp: 60 tablet, Rfl: 5 .  pravastatin (PRAVACHOL) 40 MG tablet, Take 1 tablet by mouth once daily, Disp: 90 tablet, Rfl: 0 .  psyllium (METAMUCIL) 58.6 % powder, Take 1 packet by mouth daily., Disp: , Rfl:  .  valACYclovir (VALTREX) 1000 MG tablet, Take 1 tablet (1,000 mg total) by mouth 2 (two) times daily., Disp: 20 tablet, Rfl: 0 Social History   Socioeconomic History  . Marital status: Widowed    Spouse name: Not on file  . Number of children: 2  . Years of education: 58  . Highest education level: High school graduate  Occupational History  . Occupation: retired  Tobacco Use  . Smoking status: Never Smoker  . Smokeless tobacco: Never Used  Vaping Use  . Vaping Use: Never used  Substance and Sexual Activity  . Alcohol use: Yes    Comment: occasional wine  . Drug use: No  . Sexual activity: Not Currently    Birth control/protection: None  Other Topics Concern  . Not on file  Social History Narrative  . Not on file   Social Determinants of Health   Financial Resource Strain: Not on file  Food Insecurity: Not on file  Transportation Needs: Not on file  Physical Activity: Not on file  Stress: Not on file  Social Connections: Not on file  Intimate Partner Violence: Not on file   Family History  Problem Relation Age of Onset  . Hypertension Mother   . Hypertension Father   . Diabetes Father   . Hypertension Sister   . Cancer Sister   . Diabetes Brother   . Diabetes Brother   . Colon cancer Neg Hx     Objective: Office vital signs reviewed. BP (!) 142/55   Pulse 65   Temp 97.7 F (36.5 C) (Temporal)   Ht  '5\' 4"'  (1.626 m)   Wt 141 lb (64 kg)   SpO2 97%   BMI 24.20 kg/m   Physical Examination:  General: Awake, alert, well nourished, No acute distress HEENT: Normal, sclera white, MMM, no exophthalmos.  No goiter Cardio: regular rate and rhythm, S1S2 heard, no murmurs appreciated Pulm: clear to auscultation bilaterally, no wheezes, rhonchi or rales; normal work of breathing on room air Psych: Mood stable, speech normal, affect appropriate.  Patient is pleasant and interactive  Depression screen [facility] 2/9 08/28/2020 05/18/2020 11/22/2019  Decreased Interest 0 0 0  Down, Depressed, Hopeless 1 0 1  PHQ - 2 Score 1 0 1  Altered sleeping 0 -  2  Tired, decreased energy 1 - 2  Change in appetite 0 - 1  Feeling bad or failure about yourself  0 - 0  Trouble concentrating 1 - 0  Moving slowly or fidgety/restless 0 - 0  Suicidal thoughts 0 - 0  PHQ-9 Score 3 - 6  Difficult doing work/chores Not difficult at all - Somewhat difficult  Some recent data might be hidden   GAD 7 : Generalized Anxiety Score 08/28/2020  Nervous, Anxious, on Edge 2  Control/stop worrying 1  Worry too much - different things 1  Trouble relaxing 0  Restless 0  Easily annoyed or irritable 0  Afraid - awful might happen 1  Total GAD 7 Score 5      Lab Results  Component Value Date   HGBA1C 8.2 (H) 08/28/2020    Assessment/ Plan: 75 y.o. female   Type 2 diabetes mellitus with insulin therapy ([place]) - Plan: Bayer DCA Hb A1c Waived, Continuous Blood Gluc Sensor (FREESTYLE LIBRE 14 DAY SENSOR) MISC, Continuous Blood Gluc Receiver (FREESTYLE LIBRE READER) DEVI  Hypertension associated with type 2 diabetes mellitus ([place]) - Plan: Basic Metabolic Panel  Elevated TSH - Plan: Thyroid Panel With TSH  Dysuria - Plan: Urinalysis, Complete  Anxiety  Sugars not at goal.  A1c up to 8.2 today.  Given intermittent lows, I hesitate to make any adjustments to her medications.  I think that her current infection may be impacting  her sugar some but not sure if this is totally to blame.  I have reordered the freestyle libre in hopes that we can monitor her blood sugars more closely.  I like her to see Almyra Free in 1 month for review of the sugar log and with me in 3 months, sooner if needed.  Blood pressure at goal upon recheck for age  Check thyroid panel given recent elevation in TSH.  May be contributing to increased anxiety  Currently being treated for urinary tract infection and repeat dip was totally clear  Trial of Prozac 10 mg daily.  Need to be careful given use of beta-blocker.  If starts having issues will plan to switch to SNRI.  No orders of the defined types were placed in this encounter.  No orders of the defined types were placed in this encounter.    Janora Norlander, DO [place] [phone]

## 2020-08-28 NOTE — Patient Instructions (Signed)
Sugar is up a lot.  Watch your intake. Freestyle Libre ordered.  Come in 1 month to see Almyra Free for sugar review.

## 2020-08-29 LAB — THYROID PANEL WITH TSH
Free Thyroxine Index: 1.7 (ref 1.2–4.9)
T3 Uptake Ratio: 23 % — ABNORMAL LOW (ref 24–39)
T4, Total: 7.4 ug/dL (ref 4.5–12.0)
TSH: 4.11 u[IU]/mL (ref 0.450–4.500)

## 2020-08-29 LAB — BASIC METABOLIC PANEL
BUN/Creatinine Ratio: 17 (ref 12–28)
BUN: 17 mg/dL (ref 8–27)
CO2: 27 mmol/L (ref 20–29)
Calcium: 9.8 mg/dL (ref 8.7–10.3)
Chloride: 101 mmol/L (ref 96–106)
Creatinine, Ser: 0.99 mg/dL (ref 0.57–1.00)
GFR calc Af Amer: 64 mL/min/{1.73_m2} (ref >59)
GFR calc non Af Amer: 56 mL/min/{1.73_m2} — ABNORMAL LOW (ref >59)
Glucose: 136 mg/dL — ABNORMAL HIGH (ref 65–99)
Potassium: 4.7 mmol/L (ref 3.5–5.2)
Sodium: 144 mmol/L (ref 134–144)

## 2020-09-04 ENCOUNTER — Telehealth: Payer: Self-pay

## 2020-09-05 NOTE — Telephone Encounter (Signed)
Yes absolutely ok to change

## 2020-09-05 NOTE — Telephone Encounter (Signed)
Left detailed message with pharmacy  

## 2020-09-11 ENCOUNTER — Other Ambulatory Visit: Payer: Self-pay | Admitting: Family Medicine

## 2020-09-11 DIAGNOSIS — E119 Type 2 diabetes mellitus without complications: Secondary | ICD-10-CM

## 2020-09-13 ENCOUNTER — Ambulatory Visit (INDEPENDENT_AMBULATORY_CARE_PROVIDER_SITE_OTHER): Payer: Medicare Other

## 2020-09-13 ENCOUNTER — Other Ambulatory Visit: Payer: Self-pay

## 2020-09-13 ENCOUNTER — Encounter: Payer: Self-pay | Admitting: Family Medicine

## 2020-09-13 DIAGNOSIS — Z23 Encounter for immunization: Secondary | ICD-10-CM | POA: Diagnosis not present

## 2020-09-13 NOTE — Progress Notes (Signed)
   Covid-19 Vaccination Clinic  Name:  JIMMIE DATTILIO    MRN: [id number] DOB: 12-14-1945  09/13/2020  Ms. Newhart was observed post Covid-19 immunization for 15 minutes without incident. She was provided with Vaccine Information Sheet and instruction to access the V-Safe system.   Ms. Gorgas was instructed to call 911 with any severe reactions post vaccine: Marland Kitchen Difficulty breathing  . Swelling of face and throat  . A fast heartbeat  . A bad rash all over body  . Dizziness and weakness   Immunizations Administered    Name Date Dose VIS Date Route   Moderna COVID-19 Vaccine 09/13/2020  1:55 PM 0.5 mL 04/26/2020 Intramuscular   Manufacturer: Moderna   Lot: [id number]   [place]: [id number]

## 2020-09-18 ENCOUNTER — Telehealth: Payer: Self-pay | Admitting: *Deleted

## 2020-09-18 DIAGNOSIS — E119 Type 2 diabetes mellitus without complications: Secondary | ICD-10-CM

## 2020-09-18 DIAGNOSIS — Z794 Long term (current) use of insulin: Secondary | ICD-10-CM

## 2020-09-18 NOTE — Telephone Encounter (Signed)
FreeStyle Libre 2 Sensor rejected by plan   Options to try are:   [employer] 2 Sensor Required AutoSoft 90 Infusion Set Required Dexcom G4 Platinum Receiver Required Dexcom G4 Platinum Transmitter Required Dexcom G5 Mob/G4 Plat Sensor Required Dexcom G5 Mobile Receiver Required Dexcom G5 Mobile Transmitter Required Dexcom G5 Receiver Kit Required Dexcom G6 Receiver Required Dexcom G6 Sensor Required Dexcom G6 Transmitter Required Eversense Sensor/Holder Required [employer] Required FreeStyle Libre 14 Day Reader Required FreeStyle Libre 2 Reader Required Guardian Sensor 3 Required T: slim X2 Ins Pmp/Control 7.4 Required T:slim X2 40m Cartridge Required  Checks BS QID and PRN   WILL ATTEMPT pa ON THIS ONE SINCE pa REQUIRED ON ALL.  Key: [id number] Sent to plan

## 2020-09-20 NOTE — Telephone Encounter (Signed)
Spoke with wendy and that is a cover my meds suggestion they checked with insurance and both are not covered by her plan

## 2020-09-20 NOTE — Telephone Encounter (Signed)
I'm assuming the original free style libre not covered but the [name] 2 is?  If so, please give verbal for Rena Lara 2

## 2020-09-20 NOTE — Telephone Encounter (Signed)
Freestyle [employer].  Under section 1860D-2(e)(2) of the Social Security Act (the Act), certain drugs are excluded from Medicare coverage or are excluded from coverage when used to treat certain medical conditions. Grover Canavan Libr Kit 2 Sensor is one of the drugs that are excluded from Medicare coverage by law, and we do not offer the drug as a supplemental benefit. The requested device is not a Part D eligible product as defined by the Medicare Part D benefit and is not covered under the Part D prescription drug plan. Coverage may be available as a Part B medical Durable Medical Equipment benefit. Please call Durable Medical Equipment (DME) vendors [facility] at [phone] or [employer] at [phone] for assistance obtaining this device.

## 2020-09-20 NOTE — Telephone Encounter (Signed)
I'm very confused.  It's listed in the "formulary" [facility] sent. See below.

## 2020-09-20 NOTE — Telephone Encounter (Signed)
FYI: Contacted patient and she stated that she picked up the rx from [facility] and only received the reader and no sensor. I contacted [facility] to ask why they would give her one part and not the other when they notified us that insurance would not cover. [facility] stated that they needed her secondary insurance info which I gave and they sent the sensors through and they were approved. Notified patient and she will stop by and pick them up.

## 2020-09-20 NOTE — Telephone Encounter (Signed)
Susan Petty 2 is not covered either.

## 2020-09-20 NOTE — Telephone Encounter (Signed)
Ok sounds like she's just going to have to check the conventional way.  Please inform pt.  Glad to send in supplies and new monitor if needed

## 2020-09-25 ENCOUNTER — Other Ambulatory Visit: Payer: Medicare Other

## 2020-09-25 NOTE — Telephone Encounter (Signed)
Pt is checking on prior auth for sensor

## 2020-09-29 ENCOUNTER — Ambulatory Visit (INDEPENDENT_AMBULATORY_CARE_PROVIDER_SITE_OTHER): Payer: Medicare Other | Admitting: Family Medicine

## 2020-09-29 ENCOUNTER — Encounter: Payer: Self-pay | Admitting: Family Medicine

## 2020-09-29 DIAGNOSIS — E119 Type 2 diabetes mellitus without complications: Secondary | ICD-10-CM | POA: Diagnosis not present

## 2020-09-29 DIAGNOSIS — H6690 Otitis media, unspecified, unspecified ear: Secondary | ICD-10-CM

## 2020-09-29 DIAGNOSIS — Z794 Long term (current) use of insulin: Secondary | ICD-10-CM

## 2020-09-29 MED ORDER — CEFDINIR 300 MG PO CAPS: 300.0000 mg | ORAL_CAPSULE | Freq: Two times a day (BID) | ORAL | 0 refills | Status: DC

## 2020-09-29 MED ORDER — FLUCONAZOLE 150 MG PO TABS
150.0000 mg | ORAL_TABLET | Freq: Once | ORAL | 0 refills | Status: AC
Start: 2020-09-29 — End: 2020-09-29

## 2020-09-29 MED ORDER — FREESTYLE LIBRE 14 DAY SENSOR MISC: 1.0000 [IU] | Freq: Every day | 12 refills | Status: DC

## 2020-09-29 MED ORDER — FREESTYLE LIBRE READER DEVI: 1.0000 [IU] | Freq: Every day | 99 refills | Status: DC

## 2020-09-29 NOTE — Progress Notes (Signed)
Telephone visit  Subjective: CC: ear infection PCP: Janora Norlander, DO ELT:Susan Petty is a 75 y.o. female calls for telephone consult today. Patient provides verbal consent for consult held via phone.  Due to COVID-19 pandemic this visit was conducted virtually. This visit type was conducted due to national recommendations for restrictions regarding the COVID-19 Pandemic (e.g. social distancing, sheltering in place) in an effort to limit this patient's exposure and mitigate transmission in our community. All issues noted in this document were discussed and addressed.  A physical exam was not performed with this format.   Location of patient: home Location of provider: [facility] Others present for call: none  1. Ear infection She reports warmth in her left ear.  She reports her sugar went up as well. No dizziness, difficulty hearing. She reports mild pain.  No drainage.  She reports low grade temperature to 87F yesterday.      ROS: Per HPI  Allergies  Allergen Reactions   Sulfa Antibiotics    Sulfa Drugs Cross Reactors Rash   Past Medical History:  Diagnosis Date   Diabetes mellitus    GERD (gastroesophageal reflux disease)    Hyperlipidemia    Hypertension    Osteoporosis     Current Outpatient Medications:    alendronate (FOSAMAX) 70 MG tablet, TAKE ONE TABLET BY MOUTH ONCE A WEEK WITH A FULL GLASS OF WATER ON AN EMPTY STOMACH, Disp: 12 tablet, Rfl: 3   amLODipine (NORVASC) 10 MG tablet, Take 1 tablet (10 mg total) by mouth daily. STOP HCTZ, Disp: 90 tablet, Rfl: 3   aspirin EC 81 MG tablet, Take 81 mg by mouth daily., Disp: , Rfl:    azelastine (ASTELIN) 0.1 % nasal spray, Place 1 spray into both nostrils 2 (two) times daily. (Patient taking differently: Place 1 spray into both nostrils as needed. ), Disp: 30 mL, Rfl: 12   benazepril (LOTENSIN) 40 MG tablet, Take 1 tablet by mouth once daily, Disp: 90 tablet, Rfl: 0   blood glucose meter kit and supplies,  Dispense based on patient and insurance preference. Use up to four times daily as directed. (FOR ICD-10 E10.9, E11.9)., Disp: 1 each, Rfl: 0   Cholecalciferol (VITAMIN D-3 PO), Take 2,000 Units by mouth daily., Disp: , Rfl:    Continuous Blood Gluc Receiver (FREESTYLE LIBRE READER) DEVI, 1 Units by Does not apply route daily. UAD to test BGs daily. Dx E11.9, Disp: 1 each, Rfl: prn   Continuous Blood Gluc Sensor (FREESTYLE LIBRE 14 DAY SENSOR) MISC, 1 Units by Does not apply route daily. UAD to check blood sugars E11.9, Disp: 2 each, Rfl: 12   FLUoxetine (PROZAC) 10 MG capsule, Take 1 capsule (10 mg total) by mouth daily., Disp: 90 capsule, Rfl: 3   fluticasone (FLONASE) 50 MCG/ACT nasal spray, Place 2 sprays into both nostrils daily., Disp: 16 g, Rfl: 6   glucose blood (ACCU-CHEK AVIVA PLUS) test strip, USE TO CHECK GLUCOSE 6 TIMES DAILY Dx E11.9, Disp: 600 each, Rfl: 3   HUMULIN N 100 UNIT/ML injection, INJECT 30 UNITS SUBCUTANEOUSLY IN THE MORNING AND 5 UNITS IN THE EVENING, Disp: 30 mL, Rfl: 0   insulin regular (NOVOLIN R) 100 units/mL injection, Inject 0.03 mLs (3 Units total) into the skin 3 (three) times daily before meals. (Patient taking differently: Inject 3 Units into the skin 3 (three) times daily before meals. PRN), Disp: 7.5 mL, Rfl: 1   LINZESS 290 MCG CAPS capsule, TAKE 1 CAPSULE BY MOUTH ONCE DAILY  BEFORE BREAKFAST, Disp: 30 capsule, Rfl: 11   metFORMIN (GLUCOPHAGE) 500 MG tablet, Take 2 tablets (1,000 mg total) by mouth 2 (two) times daily with a meal., Disp: 360 tablet, Rfl: 1   metoprolol tartrate (LOPRESSOR) 25 MG tablet, Take 1 tablet by mouth twice daily, Disp: 180 tablet, Rfl: 0   ondansetron (ZOFRAN) 4 MG tablet, Take 1 tablet (4 mg total) by mouth every 8 (eight) hours as needed for nausea or vomiting., Disp: 30 tablet, Rfl: 1   pantoprazole (PROTONIX) 40 MG tablet, Take one tablet before breakfast and one tablet before evening snack., Disp: 60 tablet, Rfl: 5    pravastatin (PRAVACHOL) 40 MG tablet, Take 1 tablet by mouth once daily, Disp: 90 tablet, Rfl: 0   psyllium (METAMUCIL) 58.6 % powder, Take 1 packet by mouth daily., Disp: , Rfl:    valACYclovir (VALTREX) 1000 MG tablet, Take 1 tablet (1,000 mg total) by mouth 2 (two) times daily., Disp: 20 tablet, Rfl: 0  Assessment/ Plan: 75 y.o. female   Ear infection - Plan: cefdinir (OMNICEF) 300 MG capsule, fluconazole (DIFLUCAN) 150 MG tablet  Type 2 diabetes mellitus with insulin therapy ([place]) - Plan: Continuous Blood Gluc Receiver (FREESTYLE LIBRE READER) DEVI, Continuous Blood Gluc Sensor (FREESTYLE LIBRE 14 DAY SENSOR) MISC  Empiric treatment for presumed otitis externa sent.  Continue monitoring BGs, ear symptoms.  Home care instructions reviewed.  Follow up prn.  Freestyle libre sent again.  Start time: 10:07am End time: 10:13am  Total time spent on patient care (including telephone call/ virtual visit): 5 minutes  [place], [place] [phone]

## 2020-10-04 ENCOUNTER — Telehealth: Payer: Self-pay

## 2020-10-04 DIAGNOSIS — E119 Type 2 diabetes mellitus without complications: Secondary | ICD-10-CM

## 2020-10-04 MED ORDER — INSULIN REGULAR HUMAN 100 UNIT/ML IJ SOLN: 3.0000 [IU] | Freq: Three times a day (TID) | INTRAMUSCULAR | 0 refills | Status: DC

## 2020-10-04 NOTE — Telephone Encounter (Signed)
  Prescription Request  10/04/2020  What is the name of the medication or equipment? Pt needs Humulin R (as needed) she got the Humulin n  Have you contacted your pharmacy to request a refill? (if applicable) yes  Which pharmacy would you like this sent to? [facility]   Patient notified that their request is being sent to the clinical staff for review and that they should receive a response within 2 business days.

## 2020-10-04 NOTE — Telephone Encounter (Signed)
Pt aware refill sent to pharmacy 

## 2020-10-06 MED ORDER — INSULIN REGULAR HUMAN 100 UNIT/ML IJ SOLN: 3.0000 [IU] | Freq: Three times a day (TID) | INTRAMUSCULAR | 0 refills | Status: DC

## 2020-10-06 NOTE — Telephone Encounter (Signed)
Daughter Cyril Mourning is calling about insulin regular (NOVOLIN R) 100 units/mL injection rx again. [facility] keeps telling pt that they do not have this rx but it was called in on 10/04/2020. Pt will be out today. Please call in again.

## 2020-10-06 NOTE — Addendum Note (Signed)
Addended by: Ladean Raya on: 10/06/2020 02:26 PM   Modules accepted: Orders

## 2020-10-06 NOTE — Telephone Encounter (Signed)
[facility]ed [facility] they id not have it so I resent in and patient is aware.

## 2020-10-11 ENCOUNTER — Telehealth: Payer: Self-pay

## 2020-10-11 NOTE — Telephone Encounter (Signed)
Patient declines starting on Prolia at this time due to cost

## 2020-10-15 ENCOUNTER — Other Ambulatory Visit: Payer: Self-pay | Admitting: Family Medicine

## 2020-10-15 DIAGNOSIS — E1159 Type 2 diabetes mellitus with other circulatory complications: Secondary | ICD-10-CM

## 2020-10-15 DIAGNOSIS — E785 Hyperlipidemia, unspecified: Secondary | ICD-10-CM

## 2020-10-15 DIAGNOSIS — I152 Hypertension secondary to endocrine disorders: Secondary | ICD-10-CM

## 2020-10-15 DIAGNOSIS — E1169 Type 2 diabetes mellitus with other specified complication: Secondary | ICD-10-CM

## 2020-10-31 ENCOUNTER — Encounter: Payer: Medicare Other | Admitting: Pharmacist

## 2020-10-31 NOTE — Progress Notes (Signed)
No show  This encounter was created in error - please disregard.

## 2020-11-03 ENCOUNTER — Inpatient Hospital Stay: Admission: RE | Admit: 2020-11-03 | Payer: Medicare Other | Source: Ambulatory Visit

## 2020-12-27 ENCOUNTER — Other Ambulatory Visit: Payer: Self-pay | Admitting: Family Medicine

## 2020-12-27 DIAGNOSIS — E785 Hyperlipidemia, unspecified: Secondary | ICD-10-CM

## 2020-12-27 DIAGNOSIS — I152 Hypertension secondary to endocrine disorders: Secondary | ICD-10-CM

## 2021-01-02 ENCOUNTER — Other Ambulatory Visit: Payer: Self-pay | Admitting: *Deleted

## 2021-01-02 DIAGNOSIS — E1165 Type 2 diabetes mellitus with hyperglycemia: Secondary | ICD-10-CM

## 2021-01-02 MED ORDER — METFORMIN HCL 500 MG PO TABS: 1000.0000 mg | ORAL_TABLET | Freq: Two times a day (BID) | ORAL | 0 refills | Status: DC

## 2021-01-04 ENCOUNTER — Ambulatory Visit (INDEPENDENT_AMBULATORY_CARE_PROVIDER_SITE_OTHER): Payer: Medicare Other | Admitting: Pharmacist

## 2021-01-04 ENCOUNTER — Other Ambulatory Visit: Payer: Self-pay

## 2021-01-04 ENCOUNTER — Ambulatory Visit: Payer: Medicare Other | Admitting: Pharmacist

## 2021-01-04 DIAGNOSIS — E119 Type 2 diabetes mellitus without complications: Secondary | ICD-10-CM | POA: Diagnosis not present

## 2021-01-04 DIAGNOSIS — Z794 Long term (current) use of insulin: Secondary | ICD-10-CM | POA: Diagnosis not present

## 2021-01-04 NOTE — Progress Notes (Signed)
    01/04/2021 Name: Susan Petty MRN: [id number] DOB: 04-17-1946   S: 75 yoF Presents for diabetes evaluation, education, and management Patient was referred and last seen by Primary Care Provider on 09/2020.  Insurance coverage/medication affordability: [facility]  Patient reports adherence with medications. Current diabetes medications include: humulin n/novolin R, metformin Current hypertension medications include: metop, benazepril Goal 130/80 Current hyperlipidemia medications include: pravastatin  Patient reports hypoglycemic events.   Patient reported dietary habits: Eats 2 meals/day Discussed meal planning options and Plate method for healthy eating Avoid sugary drinks and desserts Incorporate balanced protein, non starchy veggies, 1 serving of carbohydrate with each meal Increase water intake Increase physical activity as able  Patient-reported exercise habits: unable due to knee pain/discomfort  Patient reports nocturia (nighttime urination).  Patient reports neuropathy (nerve pain).  Patient reports visual changes.  Patient reports self foot exams.    O:  Lab Results  Component Value Date   HGBA1C 8.2 (H) 08/28/2020    Lipid Panel     Component Value Date/Time   CHOL 167 05/18/2020 0854   CHOL 180 12/14/2012 1611   TRIG 61 05/18/2020 0854   TRIG 102 08/29/2014 1336   TRIG 85 12/14/2012 1611   HDL 90 05/18/2020 0854   HDL 77 08/29/2014 1336   HDL 72 12/14/2012 1611   CHOLHDL 1.9 05/18/2020 0854   LDLCALC 65 05/18/2020 0854   LDLCALC 101 (H) 01/11/2014 0837   LDLCALC 91 12/14/2012 1611     Home fasting blood sugars: 100-150  2 hour post-meal/random blood sugars: up to 300 some days.    Clinical Atherosclerotic Cardiovascular Disease (ASCVD): No   The 10-year ASCVD risk score Mikey Bussing DC Jr., et al., 2013) is: 52.4%   Values used to calculate the score:     Age: 17 years     Sex: Female     Is Non-Hispanic African American: No      Diabetic: Yes     Tobacco smoker: No     Systolic Blood Pressure: 287 mmHg     Is BP treated: Yes     HDL Cholesterol: 90 mg/dL     Total Cholesterol: 167 mg/dL    A/P:  Diabetes T2DM currently UNCONTROLLED. Patient is able to verbalize appropriate hypoglycemia management plan. Patient is adherent with medication. Control is suboptimal due to DIET/LIFESTYLE.  -WE NEED TO GET A BETTER PICTURE OF PATIENT'S BLOOD GLUCOSE THROUGHOUT THE DAY-->SUBMITTED LIBRE 2 CGM VIA [facility]  AWAIT CGM TO MAKE ADJUSTMENTS  WOULD LIKE TO TRANSITION PATIENT TO BASAL INSULIN, PLUS METFORMIN AND AN SGLT2  -CONTINUE CURRENT REGIMEN FOR NOW, EAT STEADY MEALS DURING THE DAY TO AVOID HYPOGLYCEMIA TAKES INSULIN NPH 20 UNITS IN THE AM, 7 UNITS AT NIGHT 5 units OF REGULAR WITH DINNER  -CONTINUE METFORMIN-GFR 56  -Extensively discussed pathophysiology of diabetes, recommended lifestyle interventions, dietary effects on blood sugar control  -Counseled on s/sx of and management of hypoglycemia  -Next A1C anticipated 3 months.   Written patient instructions provided.  Total time in face to face counseling 25 minutes.   Follow up Pharmacist in 2 weeks  Regina Eck, PharmD, BCPS Clinical Pharmacist, [place]  II Phone [phone]

## 2021-01-10 DIAGNOSIS — E1165 Type 2 diabetes mellitus with hyperglycemia: Secondary | ICD-10-CM | POA: Diagnosis not present

## 2021-01-11 ENCOUNTER — Other Ambulatory Visit: Payer: Self-pay

## 2021-01-11 ENCOUNTER — Ambulatory Visit (INDEPENDENT_AMBULATORY_CARE_PROVIDER_SITE_OTHER): Payer: Medicare Other | Admitting: Pharmacist

## 2021-01-11 DIAGNOSIS — E119 Type 2 diabetes mellitus without complications: Secondary | ICD-10-CM

## 2021-01-11 NOTE — Progress Notes (Signed)
Chronic Care Management Pharmacy Note  01/11/2021 Name:  Susan Petty MRN:  [id number] DOB:  1945/11/05  Summary: diabetes management  Recommendations/Changes made from today's visit: Diabetes: Uncontrolled; current treatment:HUMULIN N 29 UNITS IN AM, 5 UNITS IN PM; NOVOLIN R 3 UNITS 3X DAILY WITH MEALS, METFORMIN Patient does not wish to switch regimen-she has been on these insulins for decades Encouraged patient to consider switching to bolus regimen with potential GLP1/SGTL2 for steady/improve glycemic control Libre 2 CGM ordered via [employer]--shipping tomorrow for patient--she will call to set up appt with me for CGM education Gfr 56 Current glucose readings: fasting glucose: <150, post prandial glucose: varies Reports hypoglycemic/hyperglycemic symptoms Discussed meal planning options and Plate method for healthy eating Avoid sugary drinks and desserts Incorporate balanced protein, non starchy veggies, 1 serving of carbohydrate with each meal Increase water intake Increase physical activity as able Current exercise: n/a-does not walk like she used to due to age Educated on potential new regimen/libre CGM  Plan: f/u next week  Subjective: Susan Petty is an 75 y.o. year old female who is a primary patient of Janora Norlander, DO.  The CCM team was consulted for assistance with disease management and care coordination needs.    Engaged with patient by telephone for initial visit in response to provider referral for pharmacy case management and/or care coordination services.   Consent to Services:  The patient was given the following information about Chronic Care Management services today, agreed to services, and gave verbal consent: 1. CCM service includes personalized support from designated clinical staff supervised by the primary care provider, including individualized plan of care and coordination with other care providers 2. 24/7 contact phone  numbers for assistance for urgent and routine care needs. 3. Service will only be billed when office clinical staff spend 20 minutes or more in a month to coordinate care. 4. Only one practitioner may furnish and bill the service in a calendar month. 5.The patient may stop CCM services at any time (effective at the end of the month) by phone call to the office staff. 6. The patient will be responsible for cost sharing (co-pay) of up to 20% of the service fee (after annual deductible is met). Patient agreed to services and consent obtained.  Patient Care Team: Janora Norlander, DO as PCP - General (Family Medicine) Danie Binder, MD (Inactive) as Consulting Physician (Gastroenterology) Lavera Guise, [facility] (Pharmacist) Eloise Harman, DO as Consulting Physician (Internal Medicine)   Objective:  Lab Results  Component Value Date   CREATININE 0.99 08/28/2020   CREATININE 0.77 05/18/2020   CREATININE 0.79 08/20/2019    Lab Results  Component Value Date   HGBA1C 8.2 (H) 08/28/2020   Last diabetic Eye exam: No results found for: HMDIABEYEEXA  Last diabetic Foot exam: No results found for: HMDIABFOOTEX      Component Value Date/Time   CHOL 167 05/18/2020 0854   CHOL 180 12/14/2012 1611   TRIG 61 05/18/2020 0854   TRIG 102 08/29/2014 1336   TRIG 85 12/14/2012 1611   HDL 90 05/18/2020 0854   HDL 77 08/29/2014 1336   HDL 72 12/14/2012 1611   CHOLHDL 1.9 05/18/2020 0854   LDLCALC 65 05/18/2020 0854   [place] 101 (H) 01/11/2014 0837   LDLCALC 91 12/14/2012 1611    Hepatic Function Latest Ref Rng & Units 05/18/2020 08/20/2019 05/19/2019  Total Protein 6.0 - 8.5 g/dL 7.0 6.7 6.4  Albumin 3.7 - 4.7  g/dL 4.5 4.3 4.2  AST 0 - 40 IU/L '22 21 21  ' ALT 0 - 32 IU/L '14 12 14  ' Alk Phosphatase 44 - 121 IU/L 86 88 75  Total Bilirubin 0.0 - 1.2 mg/dL 0.3 <0.2 0.3    Lab Results  Component Value Date/Time   TSH 4.110 08/28/2020 01:02 PM   TSH 4.990 (H) 05/18/2020 08:54 AM    CBC  Latest Ref Rng & Units 11/22/2019 08/20/2019 05/19/2019  WBC 3.4 - 10.8 x10E3/uL - 6.0 5.2  Hemoglobin 11.1 - 15.9 g/dL - 12.6 11.5  Hematocrit 34.0 - 46.6 % 35.9 36.4 35.2  Platelets 150 - 450 x10E3/uL - 224 171    Lab Results  Component Value Date/Time   VD25OH 59.3 05/18/2020 08:54 AM   VD25OH 70.9 12/11/2018 09:29 AM    Clinical ASCVD: No  The 10-year ASCVD risk score Mikey Bussing DC Jr., et al., 2013) is: 52.4%   Values used to calculate the score:     Age: 75 years     Sex: Female     Is Non-Hispanic African American: No     Diabetic: Yes     Tobacco smoker: No     Systolic Blood Pressure: 696 mmHg     Is BP treated: Yes     HDL Cholesterol: 90 mg/dL     Total Cholesterol: 167 mg/dL    Other: (CHADS2VASc if Afib, PHQ9 if depression, MMRC or CAT for COPD, ACT, DEXA)  Social History   Tobacco Use  Smoking Status Never  Smokeless Tobacco Never   BP Readings from Last 3 Encounters:  08/28/20 (!) 142/55  05/18/20 (!) 171/64  02/16/20 (!) 171/67   Pulse Readings from Last 3 Encounters:  08/28/20 65  05/18/20 62  02/16/20 73   Wt Readings from Last 3 Encounters:  08/28/20 141 lb (64 kg)  05/18/20 138 lb 12.8 oz (63 kg)  02/16/20 136 lb 12.8 oz (62.1 kg)    Assessment: Review of patient past medical history, allergies, medications, health status, including review of consultants reports, laboratory and other test data, was performed as part of comprehensive evaluation and provision of chronic care management services.   SDOH:  (Social Determinants of Health) assessments and interventions performed:    CCM Care Plan  Allergies  Allergen Reactions   Sulfa Antibiotics    Sulfa Drugs Cross Reactors Rash    Medications Reviewed Today     Reviewed by Lavera Guise, [facility] (Pharmacist) on 01/12/21 at 34  Med List Status: <None>   Medication Order Taking? Sig Documenting Provider Last Dose Status Informant  alendronate (FOSAMAX) 70 MG tablet [id number]  TAKE ONE TABLET  BY MOUTH ONCE A WEEK WITH A FULL GLASS OF WATER ON AN EMPTY STOMACH Ronnie Doss M, DO  Active   amLODipine (NORVASC) 10 MG tablet [id number]  Take 1 tablet (10 mg total) by mouth daily. STOP HCTZ Ronnie Doss M, DO  Active   aspirin EC 81 MG tablet [id number] No Take 81 mg by mouth daily. [provider] Taking Active Self  benazepril (LOTENSIN) 40 MG tablet [id number]  Take 1 tablet by mouth once daily Gottschalk, Ashly M, DO  Active   blood glucose meter kit and supplies [id number] No Dispense based on patient and insurance preference. Use up to four times daily as directed. (FOR ICD-10 E10.9, E11.9). Baruch Gouty, FNP Taking Active   Cholecalciferol (VITAMIN D-3 PO) [id number] No Take 2,000 Units by mouth daily. [provider] Taking Active  Continuous Blood Gluc Sensor (FREESTYLE LIBRE 2 SENSOR) MISC [id number]  by Does not apply route. [provider]  Active   FLUoxetine (PROZAC) 10 MG capsule [id number]  Take 1 capsule (10 mg total) by mouth daily. Ronnie Doss M, DO  Active   fluticasone (FLONASE) 50 MCG/ACT nasal spray [id number] No Place 2 sprays into both nostrils daily. Eustaquio Maize, MD Taking Active   glucose blood (ACCU-CHEK AVIVA PLUS) test strip [id number] No USE TO CHECK GLUCOSE 6 TIMES DAILY Dx E11.9 Janora Norlander, DO Taking Active   HUMULIN N 100 UNIT/ML injection [id number]  INJECT 30 UNITS SUBCUTANEOUSLY IN THE MORNING AND 5 UNITS IN THE [place], Ashly M, DO  Active   insulin regular (NOVOLIN R) 100 units/mL injection [id number]  Inject 0.03 mLs (3 Units total) into the skin 3 (three) times daily before meals. Ronnie Doss [place], [place]  Active   LINZESS 290 MCG CAPS capsule [id number]  TAKE 1 CAPSULE BY MOUTH ONCE DAILY BEFORE BREAKFAST Carlis Stable, NP  Active   metFORMIN (GLUCOPHAGE) 500 MG tablet [id number]  Take 2 tablets (1,000 mg total) by mouth 2 (two) times daily with a meal. Ronnie Doss M, DO  Active   metoprolol  tartrate (LOPRESSOR) 25 MG tablet [id number]  Take 1 tablet by mouth twice daily Gottschalk, Ashly M, DO  Active   ondansetron (ZOFRAN) 4 MG tablet [id number] No Take 1 tablet (4 mg total) by mouth every 8 (eight) hours as needed for nausea or vomiting. Mahala Menghini, PA-C Taking Active   pantoprazole (PROTONIX) 40 MG tablet [id number] No Take one tablet before breakfast and one tablet before evening snack. Mahala Menghini, PA-C Taking Active   pravastatin (PRAVACHOL) 40 MG tablet [id number]  Take 1 tablet by mouth once daily Gottschalk, Ashly M, DO  Active   psyllium (METAMUCIL) 58.6 % powder [id number] No Take 1 packet by mouth daily. [provider] Taking Active   valACYclovir (VALTREX) 1000 MG tablet [id number]  Take 1 tablet (1,000 mg total) by mouth 2 (two) times daily. Claretta Fraise, MD  Active             Patient Active Problem List   Diagnosis Date Noted   Dysuria 08/15/2020   Age-related osteoporosis without current pathological fracture 12/11/2018   Constipation 06/19/2016   Allergic rhinitis 04/22/2016   Bowel habit changes 03/20/2016   Bloating 03/20/2016   Body mass index (BMI) of 23.0-23.9 in adult 07/20/2015   GAD (generalized anxiety disorder) 03/14/2015   GERD (gastroesophageal reflux disease) 12/14/2012   Type 2 diabetes mellitus with insulin therapy ([place]) 12/14/2012   Hyperlipidemia associated with type 2 diabetes mellitus ([place]) 12/14/2012   Hypertension associated with type 2 diabetes mellitus ([place]) 12/14/2012   S/P left THA, 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    Immunization History  Administered Date(s) Administered   Fluad Quad(high Dose 65+) 04/21/2019, 05/18/2020   Influenza, High Dose Seasonal PF 05/05/2013, 04/08/2017, 04/14/2018   Influenza,inj,Quad PF,6+ Mos 04/13/2014   Influenza-Unspecified 04/20/2012   Moderna Sars-Covid-2 Vaccination 09/13/2020   Pneumococcal Conjugate-13 03/14/2015   Pneumococcal Polysaccharide-23 04/14/2018   Td 10/18/1996     Conditions to be addressed/monitored: DMII  Care Plan : PHARMD MEDICATION MANAGEMENT  Updates made by Lavera Guise, [place] since 01/12/2021 12:00 AM     Problem: DISEASE PROGRESSION PREVENTION      Long-Range Goal: T2DM   This Visit's Progress: Not on track  Priority: High  Note:   Current Barriers:  Unable to  independently monitor therapeutic efficacy Unable to maintain control of T2DM  Pharmacist Clinical Goal(s):  Over the next 90 days, patient will achieve adherence to monitoring guidelines and medication adherence to achieve therapeutic efficacy achieve control of T2DM as evidenced by GOAL A1C<7%, IMPROVED GLYCEMIC CONTROL, AVOID HYPOGLYCEMIA  through collaboration with PharmD and provider.    Interventions: 1:1 collaboration with Janora Norlander, DO regarding development and update of comprehensive plan of care as evidenced by provider attestation and co-signature Inter-disciplinary care team collaboration (see longitudinal plan of care) Comprehensive medication review performed; medication list updated in electronic medical record  Diabetes: Uncontrolled; current treatment:HUMULIN N 30 UNITS IN AM, 5 UNITS IN PM; NOVOLIN R 3 UNITS 3X DAILY WITH MEALS, METFORMIN Patient does not wish to switch regimen-she has been on these insulins for decades Encouraged patient to consider switching to bolus regimen with potential GLP1/SGTL2 for steady/improve glycemic control Libre 2 CGM ordered via [employer]--shipping tomorrow for patient--she will call to set up appt with me for CGM education Gfr 56 Current glucose readings: fasting glucose: <150, post prandial glucose: varies Reports hypoglycemic/hyperglycemic symptoms Discussed meal planning options and Plate method for healthy eating Avoid sugary drinks and desserts Incorporate balanced protein, non starchy veggies, 1 serving of carbohydrate with each meal Increase water intake Increase physical activity as  able Current exercise: n/a-does not walk like she used to due to age Educated on potential new regimen/libre CGM   Patient Goals/Self-Care Activities Over the next 90 days, patient will:  - take medications as prescribed check glucose daily, document, and provide at future appointments  Follow Up Plan: Telephone follow up appointment with care management team member scheduled for: Face to Face appointment with care management team member scheduled for:  next week for CGM training      Medication Assistance: None required.  Patient affirms current coverage meets needs.  Patient's preferred pharmacy is:  [facility] [address] [place] [address] Phone: [phone] Fax: [phone]   Follow Up:  Patient agrees to Care Plan and Follow-up.  Plan: Face to Face appointment with care management team member scheduled for: next week  Regina Eck, PharmD, BCPS Clinical Pharmacist, [place]  II Phone [phone]

## 2021-01-12 NOTE — Patient Instructions (Signed)
Visit Information  PATIENT GOALS:  Goals Addressed               This Visit's Progress     Patient Stated     T2DM (pt-stated)        Current Barriers:  Unable to independently monitor therapeutic efficacy Unable to maintain control of T2DM  Pharmacist Clinical Goal(s):  Over the next 90 days, patient will achieve adherence to monitoring guidelines and medication adherence to achieve therapeutic efficacy achieve control of T2DM as evidenced by GOAL A1C<7%, IMPROVED GLYCEMIC CONTROL, AVOID HYPOGLYCEMIA through collaboration with PharmD and provider.    Interventions: 1:1 collaboration with Janora Norlander, DO regarding development and update of comprehensive plan of care as evidenced by provider attestation and co-signature Inter-disciplinary care team collaboration (see longitudinal plan of care) Comprehensive medication review performed; medication list updated in electronic medical record  Diabetes: Uncontrolled; current treatment:HUMULIN N 30 UNITS IN AM, 5 UNITS IN PM; NOVOLIN R 3 UNITS 3X DAILY WITH MEALS, METFORMIN Patient does not wish to switch regimen-she has been on these insulins for decades Encouraged patient to consider switching to bolus regimen with potential GLP1/SGTL2 for steady/improve glycemic control Libre 2 CGM ordered via [employer]--shipping tomorrow for patient--she will call to set up appt with me for CGM education Gfr 56 Current glucose readings: fasting glucose: <150, post prandial glucose: varies Reports hypoglycemic/hyperglycemic symptoms Discussed meal planning options and Plate method for healthy eating Avoid sugary drinks and desserts Incorporate balanced protein, non starchy veggies, 1 serving of carbohydrate with each meal Increase water intake Increase physical activity as able Current exercise: n/a-does not walk like she used to due to age Educated on potential new regimen/libre CGM   Patient Goals/Self-Care  Activities Over the next 90 days, patient will:  - take medications as prescribed check glucose daily, document, and provide at future appointments  Follow Up Plan: Telephone follow up appointment with care management team member scheduled for: Face to Face appointment with care management team member scheduled for:  next week for CGM training          The patient verbalized understanding of instructions, educational materials, and care plan provided today and declined offer to receive copy of patient instructions, educational materials, and care plan.   Face to Face appointment with care management team member scheduled for:  next week  Regina Eck, PharmD, BCPS Clinical Pharmacist, [place]  II Phone [phone]

## 2021-01-16 ENCOUNTER — Ambulatory Visit: Payer: Self-pay

## 2021-01-16 ENCOUNTER — Telehealth: Payer: Medicare Other

## 2021-01-18 ENCOUNTER — Ambulatory Visit: Payer: Medicare Other | Admitting: Pharmacist

## 2021-01-18 ENCOUNTER — Other Ambulatory Visit: Payer: Self-pay

## 2021-01-18 DIAGNOSIS — Z794 Long term (current) use of insulin: Secondary | ICD-10-CM

## 2021-01-18 DIAGNOSIS — E119 Type 2 diabetes mellitus without complications: Secondary | ICD-10-CM

## 2021-01-18 NOTE — Patient Instructions (Signed)
Visit Information  PATIENT GOALS:  Goals Addressed               This Visit's Progress     Patient Stated     T2DM (pt-stated)        Current Barriers:  Unable to independently monitor therapeutic efficacy Unable to maintain control of T2DM  Pharmacist Clinical Goal(s):  Over the next 90 days, patient will achieve adherence to monitoring guidelines and medication adherence to achieve therapeutic efficacy achieve control of T2DM as evidenced by GOAL A1C<7%, IMPROVED GLYCEMIC CONTROL, AVOID HYPOGLYCEMIA through collaboration with PharmD and provider.    Interventions: 1:1 collaboration with Janora Norlander, DO regarding development and update of comprehensive plan of care as evidenced by provider attestation and co-signature Inter-disciplinary care team collaboration (see longitudinal plan of care) Comprehensive medication review performed; medication list updated in electronic medical record  Diabetes: Uncontrolled; current treatment:HUMULIN N 30 UNITS IN AM, 5 UNITS IN PM; NOVOLIN R 3 UNITS 3X DAILY WITH MEALS, METFORMIN Patient does not wish to switch regimen at this time-she has been on these insulins for decades, we can continue to address Encouraged patient to consider switching to basal regimen with potential GLP1/SGTL2 for steady/improve glycemic control Libre 2 CGM placed on patient's right arm; education provided Gfr 56, A1c 8.2% increasing Current glucose readings: fasting glucose: <150, post prandial glucose: varies Reports hypoglycemic/hyperglycemic symptoms Discussed meal planning options and Plate method for healthy eating Avoid sugary drinks and desserts Incorporate balanced protein, non starchy veggies, 1 serving of carbohydrate with each meal Increase water intake Increase physical activity as able Current exercise: n/a-does not walk like she used to due to age Educated on libre CGM 2 --gets via mail from [employer]  Patient  Goals/Self-Care Activities Over the next 90 days, patient will:  - take medications as prescribed check glucose daily, document, and provide at future appointments  Follow Up Plan: Telephone follow up appointment with care management team member scheduled for: Face to Face appointment with care management team member scheduled for:  2 months         The patient verbalized understanding of instructions, educational materials, and care plan provided today and declined offer to receive copy of patient instructions, educational materials, and care plan.   Telephone follow up appointment with care management team member scheduled for: 2 months  Signature Regina Eck, PharmD, BCPS Clinical Pharmacist, [place]  II Phone [phone]

## 2021-01-18 NOTE — Progress Notes (Signed)
Chronic Care Management Pharmacy Note  01/18/2021 Name:  Susan Petty MRN:  [id number] DOB:  03-Jan-1946  Summary: Susan Petty 2 CMG education & application  Recommendations/Changes made from today's visit: iabetes: Uncontrolled; current treatment:HUMULIN N 30 UNITS IN AM, 5 UNITS IN PM; NOVOLIN R 3 UNITS 3X DAILY WITH MEALS, METFORMIN Patient does not wish to switch regimen at this time-she has been on these insulins for decades, we can continue to address Encouraged patient to consider switching to basal regimen with potential GLP1/SGTL2 for steady/improve glycemic control Libre 2 CGM placed on patient's right arm; education provided Gfr 56, A1c 8.2% increasing Current glucose readings: fasting glucose: <150, post prandial glucose: varies, mostly >200 Reports hypoglycemic/hyperglycemic symptoms Current exercise: n/a-does not walk like she used to due to age Educated on libre CGM 2 --gets via mail from advanced diabetes supply   Plan: f/u in 2 months  Subjective: Susan Petty is an 75 y.o. year old female who is a primary patient of Janora Norlander, DO.  The CCM team was consulted for assistance with disease management and care coordination needs.    Engaged with patient face to face for follow up visit in response to provider referral for pharmacy case management and/or care coordination services.   Consent to Services:  The patient was given information about Chronic Care Management services, agreed to services, and gave verbal consent prior to initiation of services.  Please see initial visit note for detailed documentation.   Patient Care Team: Janora Norlander, DO as PCP - General (Family Medicine) Danie Binder, MD (Inactive) as Consulting Physician (Gastroenterology) Lavera Guise, [facility] (Pharmacist) Eloise Harman, DO as Consulting Physician (Internal Medicine)  Recent office visits: 09/29/20  Recent consult visits: [facility] visits: None in  previous 6 months  Objective:  Lab Results  Component Value Date   CREATININE 0.99 08/28/2020   CREATININE 0.77 05/18/2020   CREATININE 0.79 08/20/2019    Lab Results  Component Value Date   HGBA1C 8.2 (H) 08/28/2020   Last diabetic Eye exam: No results found for: HMDIABEYEEXA  Last diabetic Foot exam: No results found for: HMDIABFOOTEX      Component Value Date/Time   CHOL 167 05/18/2020 0854   CHOL 180 12/14/2012 1611   TRIG 61 05/18/2020 0854   TRIG 102 08/29/2014 1336   TRIG 85 12/14/2012 1611   HDL 90 05/18/2020 0854   HDL 77 08/29/2014 1336   HDL 72 12/14/2012 1611   CHOLHDL 1.9 05/18/2020 0854   LDLCALC 65 05/18/2020 0854   LDLCALC 101 (H) 01/11/2014 0837   LDLCALC 91 12/14/2012 1611    Hepatic Function Latest Ref Rng & Units 05/18/2020 08/20/2019 05/19/2019  Total Protein 6.0 - 8.5 g/dL 7.0 6.7 6.4  Albumin 3.7 - 4.7 g/dL 4.5 4.3 4.2  AST 0 - 40 IU/L _0 ALT 0 - 32 IU/L _1 Alk Phosphatase 44 - 121 IU/L 86 88 75  Total Bilirubin 0.0 - 1.2 mg/dL 0.3 <0.2 0.3    Lab Results  Component Value Date/Time   TSH 4.110 08/28/2020 01:02 PM   TSH 4.990 (H) 05/18/2020 08:54 AM    CBC Latest Ref Rng & Units 11/22/2019 08/20/2019 05/19/2019  WBC 3.4 - 10.8 x10E3/uL - 6.0 5.2  Hemoglobin 11.1 - 15.9 g/dL - 12.6 11.5  Hematocrit 34.0 - 46.6 % 35.9 36.4 35.2  Platelets 150 - 450 x10E3/uL - 224 171    Lab Results  Component Value Date/Time   VD25OH 59.3 05/18/2020 08:54 AM   VD25OH 70.9 12/11/2018 09:29 AM    Clinical ASCVD: No  The 10-year ASCVD risk score Mikey Bussing DC Jr., et al., 2013) is: 52.4%   Values used to calculate the score:     Age: 75 years     Sex: Female     Is Non-Hispanic African American: No     Diabetic: Yes     Tobacco smoker: No     Systolic Blood Pressure: 948 mmHg     Is BP treated: Yes     HDL Cholesterol: 90 mg/dL     Total Cholesterol: 167 mg/dL    Other: (CHADS2VASc if Afib, PHQ9 if depression, MMRC or CAT for COPD, ACT,  DEXA)  Social History   Tobacco Use  Smoking Status Never  Smokeless Tobacco Never   BP Readings from Last 3 Encounters:  08/28/20 (!) 142/55  05/18/20 (!) 171/64  02/16/20 (!) 171/67   Pulse Readings from Last 3 Encounters:  08/28/20 65  05/18/20 62  02/16/20 73   Wt Readings from Last 3 Encounters:  08/28/20 141 lb (64 kg)  05/18/20 138 lb 12.8 oz (63 kg)  02/16/20 136 lb 12.8 oz (62.1 kg)    Assessment: Review of patient past medical history, allergies, medications, health status, including review of consultants reports, laboratory and other test data, was performed as part of comprehensive evaluation and provision of chronic care management services.   SDOH:  (Social Determinants of Health) assessments and interventions performed:    CCM Care Plan  Allergies  Allergen Reactions   Sulfa Antibiotics    Sulfa Drugs Cross Reactors Rash    Medications Reviewed Today     Reviewed by Lavera Guise, [facility] (Pharmacist) on 01/12/21 at 18  Med List Status: <None>   Medication Order Taking? Sig Documenting Provider Last Dose Status Informant  alendronate (FOSAMAX) 70 MG tablet [id number]  TAKE ONE TABLET BY MOUTH ONCE A WEEK WITH A FULL GLASS OF WATER ON AN EMPTY STOMACH Ronnie Doss M, DO  Active   amLODipine (NORVASC) 10 MG tablet [id number]  Take 1 tablet (10 mg total) by mouth daily. STOP HCTZ Ronnie Doss M, DO  Active   aspirin EC 81 MG tablet [id number] No Take 81 mg by mouth daily. [provider] Taking Active Self  benazepril (LOTENSIN) 40 MG tablet [id number]  Take 1 tablet by mouth once daily Gottschalk, Ashly M, DO  Active   blood glucose meter kit and supplies [id number] No Dispense based on patient and insurance preference. Use up to four times daily as directed. (FOR ICD-10 E10.9, E11.9). Baruch Gouty, FNP Taking Active   Cholecalciferol (VITAMIN D-3 PO) [id number] No Take 2,000 Units by mouth daily. [provider] Taking Active    Continuous Blood Gluc Sensor (FREESTYLE LIBRE 2 SENSOR) [place] [id number]  by Does not apply route. [provider]  Active   FLUoxetine (PROZAC) 10 MG capsule [id number]  Take 1 capsule (10 mg total) by mouth daily. Ronnie Doss M, DO  Active   fluticasone (FLONASE) 50 MCG/ACT nasal spray [id number] No Place 2 sprays into both nostrils daily. Eustaquio Maize, MD Taking Active   glucose blood (ACCU-CHEK AVIVA PLUS) test strip [id number] No USE TO CHECK GLUCOSE 6 TIMES DAILY Dx E11.9 Janora Norlander, DO Taking Active   HUMULIN N 100 UNIT/ML injection [id number]  INJECT 30 UNITS SUBCUTANEOUSLY IN THE MORNING AND 5 UNITS IN THE [place], [place],  DO  Active   insulin regular (NOVOLIN R) 100 units/mL injection [id number]  Inject 0.03 mLs (3 Units total) into the skin 3 (three) times daily before meals. Ronnie Doss [place], [place]  Active   LINZESS 290 MCG CAPS capsule [id number]  TAKE 1 CAPSULE BY MOUTH ONCE DAILY BEFORE BREAKFAST Carlis Stable, NP  Active   metFORMIN (GLUCOPHAGE) 500 MG tablet [id number]  Take 2 tablets (1,000 mg total) by mouth 2 (two) times daily with a meal. Ronnie Doss M, DO  Active   metoprolol tartrate (LOPRESSOR) 25 MG tablet [id number]  Take 1 tablet by mouth twice daily Gottschalk, Ashly M, DO  Active   ondansetron (ZOFRAN) 4 MG tablet [id number] No Take 1 tablet (4 mg total) by mouth every 8 (eight) hours as needed for nausea or vomiting. Mahala Menghini, PA-C Taking Active   pantoprazole (PROTONIX) 40 MG tablet [id number] No Take one tablet before breakfast and one tablet before evening snack. Mahala Menghini, PA-C Taking Active   pravastatin (PRAVACHOL) 40 MG tablet [id number]  Take 1 tablet by mouth once daily Gottschalk, Ashly M, DO  Active   psyllium (METAMUCIL) 58.6 % powder [id number] No Take 1 packet by mouth daily. [provider] Taking Active   valACYclovir (VALTREX) 1000 MG tablet [id number]  Take 1 tablet (1,000 mg total) by mouth 2 (two)  times daily. Claretta Fraise, MD  Active             Patient Active Problem List   Diagnosis Date Noted   Dysuria 08/15/2020   Age-related osteoporosis without current pathological fracture 12/11/2018   Constipation 06/19/2016   Allergic rhinitis 04/22/2016   Bowel habit changes 03/20/2016   Bloating 03/20/2016   Body mass index (BMI) of 23.0-23.9 in adult 07/20/2015   GAD (generalized anxiety disorder) 03/14/2015   GERD (gastroesophageal reflux disease) 12/14/2012   Type 2 diabetes mellitus with insulin therapy ([place]) 12/14/2012   Hyperlipidemia associated with type 2 diabetes mellitus ([place]) 12/14/2012   Hypertension associated with type 2 diabetes mellitus ([place]) 12/14/2012   S/P left THA, 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    Immunization History  Administered Date(s) Administered   Fluad Quad(high Dose 65+) 04/21/2019, 05/18/2020   Influenza, High Dose Seasonal PF 05/05/2013, 04/08/2017, 04/14/2018   Influenza,inj,Quad PF,6+ Mos 04/13/2014   Influenza-Unspecified 04/20/2012   Moderna Sars-Covid-2 Vaccination 09/13/2020   Pneumococcal Conjugate-13 03/14/2015   Pneumococcal Polysaccharide-23 04/14/2018   Td 10/18/1996    Conditions to be addressed/monitored: DMII  Care Plan : PHARMD MEDICATION MANAGEMENT  Updates made by Lavera Guise, [place] since 01/18/2021 12:00 AM     Problem: DISEASE PROGRESSION PREVENTION      Long-Range Goal: T2DM   Recent Progress: Not on track  Priority: High  Note:   Current Barriers:  Unable to independently monitor therapeutic efficacy Unable to maintain control of T2DM  Pharmacist Clinical Goal(s):  Over the next 90 days, patient will achieve adherence to monitoring guidelines and medication adherence to achieve therapeutic efficacy achieve control of T2DM as evidenced by GOAL A1C<7%, IMPROVED GLYCEMIC CONTROL, AVOID HYPOGLYCEMIA  through collaboration with PharmD and provider.    Interventions: 1:1 collaboration with Janora Norlander, DO  regarding development and update of comprehensive plan of care as evidenced by provider attestation and co-signature Inter-disciplinary care team collaboration (see longitudinal plan of care) Comprehensive medication review performed; medication list updated in electronic medical record  Diabetes: Uncontrolled; current treatment:HUMULIN N 30 UNITS IN AM, 5 UNITS IN PM; NOVOLIN R 3 UNITS  3X DAILY WITH MEALS, METFORMIN Patient does not wish to switch regimen at this time-she has been on these insulins for decades, we can continue to address Encouraged patient to consider switching to basal regimen with potential GLP1/SGTL2 for steady/improve glycemic control Libre 2 CGM placed on patient's right arm; education provided Gfr 56, A1c 8.2% increasing Current glucose readings: fasting glucose: <150, post prandial glucose: varies Reports hypoglycemic/hyperglycemic symptoms Discussed meal planning options and Plate method for healthy eating Avoid sugary drinks and desserts Incorporate balanced protein, non starchy veggies, 1 serving of carbohydrate with each meal Increase water intake Increase physical activity as able Current exercise: n/a-does not walk like she used to due to age 76 on libre CGM  2 --gets via mail from advanced diabetes supply  Patient Goals/Self-Care Activities Over the next 90 days, patient will:  - take medications as prescribed check glucose daily, document, and provide at future appointments  Follow Up Plan: Telephone follow up appointment with care management team member scheduled for: Face to Face appointment with care management team member scheduled for:  2 months      Medication Assistance: None required.  Patient affirms current coverage meets needs.  Patient's preferred pharmacy is:  [facility] [address] [place] [address] [address] [place] [address] Phone: [phone] Fax: [phone]  Uses pill box? No - n /a Pt  endorses 100% compliance  Follow Up:  Patient requests no follow-up at this time.  Plan: Telephone follow up appointment with care management team member scheduled for:  2 months  Regina Eck, PharmD, BCPS Clinical Pharmacist, [place]  [facility] Phone [phone]

## 2021-01-26 ENCOUNTER — Ambulatory Visit: Payer: Self-pay

## 2021-01-30 ENCOUNTER — Telehealth: Payer: Self-pay | Admitting: Family Medicine

## 2021-01-31 ENCOUNTER — Other Ambulatory Visit: Payer: Self-pay | Admitting: Family Medicine

## 2021-01-31 DIAGNOSIS — Z794 Long term (current) use of insulin: Secondary | ICD-10-CM

## 2021-01-31 DIAGNOSIS — E119 Type 2 diabetes mellitus without complications: Secondary | ICD-10-CM

## 2021-01-31 NOTE — Telephone Encounter (Signed)
Returned call- no answer. 

## 2021-02-10 DIAGNOSIS — E1165 Type 2 diabetes mellitus with hyperglycemia: Secondary | ICD-10-CM | POA: Diagnosis not present

## 2021-03-06 ENCOUNTER — Other Ambulatory Visit: Payer: Self-pay

## 2021-03-06 ENCOUNTER — Encounter: Payer: Self-pay | Admitting: Family Medicine

## 2021-03-06 ENCOUNTER — Ambulatory Visit (INDEPENDENT_AMBULATORY_CARE_PROVIDER_SITE_OTHER): Payer: Medicare Other | Admitting: Family Medicine

## 2021-03-06 VITALS — BP 136/72 | HR 70 | Temp 97.1°F | Ht 64.0 in | Wt 142.6 lb

## 2021-03-06 DIAGNOSIS — E785 Hyperlipidemia, unspecified: Secondary | ICD-10-CM

## 2021-03-06 DIAGNOSIS — Z794 Long term (current) use of insulin: Secondary | ICD-10-CM

## 2021-03-06 DIAGNOSIS — L304 Erythema intertrigo: Secondary | ICD-10-CM

## 2021-03-06 DIAGNOSIS — E1159 Type 2 diabetes mellitus with other circulatory complications: Secondary | ICD-10-CM | POA: Diagnosis not present

## 2021-03-06 DIAGNOSIS — E119 Type 2 diabetes mellitus without complications: Secondary | ICD-10-CM | POA: Diagnosis not present

## 2021-03-06 DIAGNOSIS — E1169 Type 2 diabetes mellitus with other specified complication: Secondary | ICD-10-CM | POA: Diagnosis not present

## 2021-03-06 DIAGNOSIS — I152 Hypertension secondary to endocrine disorders: Secondary | ICD-10-CM | POA: Diagnosis not present

## 2021-03-06 LAB — BAYER DCA HB A1C WAIVED: HB A1C (BAYER DCA - WAIVED): 7.8 % — ABNORMAL HIGH (ref <7.0)

## 2021-03-06 MED ORDER — HYDROCHLOROTHIAZIDE 12.5 MG PO CAPS
12.5000 mg | ORAL_CAPSULE | Freq: Every day | ORAL | 1 refills | Status: DC | PRN
Start: 2021-03-06 — End: 2021-06-25

## 2021-03-06 MED ORDER — NYSTATIN 100000 UNIT/GM EX CREA: 1.0000 "application " | TOPICAL_CREAM | Freq: Two times a day (BID) | CUTANEOUS | 0 refills | Status: DC

## 2021-03-06 NOTE — Patient Instructions (Addendum)
We discussed goal A1c is 7.9 or below so as to reduce your risk of strokes or heart attacks.  Intertrigo Intertrigo is skin irritation (inflammation) that happens in warm, moist areas of the body. The irritation can cause a rash and make skin raw and itchy. The rash is usually pink or red. It happens mostly between folds of skin or where skin rubs together, such as: Between the toes. In the armpits. In the groin area. Under the belly. Under the breasts. Around the butt area. This condition is not passed from person to person (is not contagious). What are the causes? Heat, moisture, rubbing, and not enough air movement. The condition can be made worse by: Sweat. Bacteria. A fungus, such as yeast. What increases the risk? Moisture in your skin folds. You are more likely to develop this condition if you: Have diabetes. Are overweight. Are not able to move around. Live in a warm and moist climate. Wear splints, braces, or other medical devices. Are not able to control your pee (urine) or poop (stool). What are the signs or symptoms? A pink or red skin rash in the skin fold or near the skin fold. Raw or scaly skin. Itching. A burning feeling. Bleeding. Leaking fluid. A bad smell. How is this treated? Cleaning and drying your skin. Taking an antibiotic medicine or using an antibiotic skin cream for a bacterial infection. Using an antifungal cream on your skin or taking pills for an infection that was caused by a fungus, such as yeast. Using a steroid ointment to stop the itching and irritation. Separating the skin fold with a clean cotton cloth to absorb moisture and allow air to flow into the area. Follow these instructions at home: Keep the affected area clean and dry. Do not scratch your skin. Stay cool as much as you can. Use an air conditioner or a fan, if you have one. Apply over-the-counter and prescription medicines only as told by your doctor. If you were prescribed an  antibiotic medicine, use it as told by your doctor. Do not stop using the antibiotic even if your condition starts to get better. Keep all follow-up visits as told by your doctor. This is important. How is this prevented?  Stay at a healthy weight. Take care of your feet. This is very important if you have diabetes. You should: Wear shoes that fit well. Keep your feet dry. Wear clean cotton or wool socks. Protect the skin in your groin and butt area as told by your doctor. To do this: Follow a regular cleaning routine. Use creams, powders, or ointments that protect your skin. Change protection pads often. Do not wear tight clothes. Wear clothes that: Are loose. Take moisture away from your body. Are made of cotton. Wear a bra that gives good support, if needed. Shower and dry yourself well after being active. Use a hair dryer on a cool setting to dry between skin folds. Keep your blood sugar under control if you have diabetes. Contact a doctor if: Your symptoms do not get better with treatment. Your symptoms get worse or they spread. You notice more redness and warmth. You have a fever. Summary Intertrigo is skin irritation that occurs when folds of skin rub together. This condition is caused by heat, moisture, and rubbing. This condition may be treated by cleaning and drying your skin and with medicines. Apply over-the-counter and prescription medicines only as told by your doctor. Keep all follow-up visits as told by your doctor. This is important.  This information is not intended to replace advice given to you by your health care provider. Make sure you discuss any questions you have with your healthcare provider. Document Revised: 04/02/2018 Document Reviewed: 04/02/2018 Elsevier Patient Education  2022 [employer].

## 2021-03-06 NOTE — Progress Notes (Signed)
Subjective: CC: Dm PCP: Janora Norlander, DO VXY:[id number] Susan Petty is a 75 y.o. female presenting to clinic today for:  1. Type 2 Diabetes with hypertension, hyperlipidemia:  She reports that she has been having hypoglycemic episodes.  She has had a low blood sugar into the 40s.  She has since discontinued her regular insulin and only continued her Novolin.  She continues to be adamant that this is worked for her for decades and does not want to change medication.  She is had to eat sweets to bring up her blood sugar and on average her blood sugars are in the 200s.  No chest pain, shortness of breath, blurred vision.  No loss of consciousness.  She really likes the freestyle [name] and wishes to continue but admits that she does not have 1 to get her through until October because 1 fell off and she had to replace it.  Last eye exam: Up-to-date Last foot exam: Up-to-date Last A1c:  Lab Results  Component Value Date   HGBA1C 8.2 (H) 08/28/2020   Nephropathy screen indicated?:  Up-to-date Last flu, zoster and/or pneumovax:  Immunization History  Administered Date(s) Administered   Fluad Quad(high Dose 65+) 04/21/2019, 05/18/2020   Influenza, High Dose Seasonal PF 05/05/2013, 04/08/2017, 04/14/2018   Influenza,inj,Quad PF,6+ Mos 04/13/2014   Influenza-Unspecified 04/20/2012   Moderna Sars-Covid-2 Vaccination 09/13/2020   Pneumococcal Conjugate-13 03/14/2015   Pneumococcal Polysaccharide-23 04/14/2018   Td 10/18/1996    ROS: Reports some right ankle edema.  Wanting hydrochlorothiazide back to half for as needed use for this.  2.  Rash Patient reports she has a rash under her breast on the left.  She describes it as itchy.  She admits that she sweats a lot and feels that this is related to the weight gain from increased insulin use as above.   ROS: Per HPI  Allergies  Allergen Reactions   Sulfa Antibiotics    Sulfa Drugs Cross Reactors Rash   Past Medical History:  Diagnosis  Date   Diabetes mellitus    GERD (gastroesophageal reflux disease)    Hyperlipidemia    Hypertension    Osteoporosis     Current Outpatient Medications:    alendronate (FOSAMAX) 70 MG tablet, TAKE ONE TABLET BY MOUTH ONCE A WEEK WITH A FULL GLASS OF WATER ON AN EMPTY STOMACH, Disp: 12 tablet, Rfl: 3   amLODipine (NORVASC) 10 MG tablet, Take 1 tablet (10 mg total) by mouth daily. STOP HCTZ, Disp: 90 tablet, Rfl: 3   aspirin EC 81 MG tablet, Take 81 mg by mouth daily., Disp: , Rfl:    benazepril (LOTENSIN) 40 MG tablet, Take 1 tablet by mouth once daily, Disp: 90 tablet, Rfl: 0   blood glucose meter kit and supplies, Dispense based on patient and insurance preference. Use up to four times daily as directed. (FOR ICD-10 E10.9, E11.9)., Disp: 1 each, Rfl: 0   Cholecalciferol (VITAMIN D-3 PO), Take 2,000 Units by mouth daily., Disp: , Rfl:    Continuous Blood Gluc Sensor (FREESTYLE LIBRE 2 SENSOR) MISC, by Does not apply route., Disp: , Rfl:    FLUoxetine (PROZAC) 10 MG capsule, Take 1 capsule (10 mg total) by mouth daily., Disp: 90 capsule, Rfl: 3   fluticasone (FLONASE) 50 MCG/ACT nasal spray, Place 2 sprays into both nostrils daily., Disp: 16 g, Rfl: 6   glucose blood (ACCU-CHEK AVIVA PLUS) test strip, USE TO CHECK GLUCOSE 6 TIMES DAILY Dx E11.9, Disp: 600 each, Rfl: 3  insulin NPH Human (HUMULIN N) 100 UNIT/ML injection, INJECT 30 UNITS SUBCUTANEOUSLY IN THE MORNING AND INJECT 5 UNITS IN THE EVENING (NEEDS TO BE SEEN BEFORE NEXT REFILL), Disp: 10 mL, Rfl: 0   insulin regular (HUMULIN R) 100 units/mL injection, Inject 0.03 mLs (3 Units total) into the skin 3 (three) times daily before meals. (NEEDS TO BE SEEN BEFORE NEXT REFILL), Disp: 3 mL, Rfl: 0   LINZESS 290 MCG CAPS capsule, TAKE 1 CAPSULE BY MOUTH ONCE DAILY BEFORE BREAKFAST, Disp: 30 capsule, Rfl: 11   metFORMIN (GLUCOPHAGE) 500 MG tablet, Take 2 tablets (1,000 mg total) by mouth 2 (two) times daily with a meal., Disp: 360 tablet, Rfl:  0   metoprolol tartrate (LOPRESSOR) 25 MG tablet, Take 1 tablet by mouth twice daily, Disp: 180 tablet, Rfl: 0   ondansetron (ZOFRAN) 4 MG tablet, Take 1 tablet (4 mg total) by mouth every 8 (eight) hours as needed for nausea or vomiting., Disp: 30 tablet, Rfl: 1   pantoprazole (PROTONIX) 40 MG tablet, Take one tablet before breakfast and one tablet before evening snack., Disp: 60 tablet, Rfl: 5   pravastatin (PRAVACHOL) 40 MG tablet, Take 1 tablet by mouth once daily, Disp: 90 tablet, Rfl: 0   psyllium (METAMUCIL) 58.6 % powder, Take 1 packet by mouth daily., Disp: , Rfl:    valACYclovir (VALTREX) 1000 MG tablet, Take 1 tablet (1,000 mg total) by mouth 2 (two) times daily., Disp: 20 tablet, Rfl: 0 Social History   Socioeconomic History   Marital status: Widowed    Spouse name: Not on file   Number of children: 2   Years of education: 12   Highest education level: High school graduate  Occupational History   Occupation: retired  Tobacco Use   Smoking status: Never   Smokeless tobacco: Never  Scientific laboratory technician Use: Never used  Substance and Sexual Activity   Alcohol use: Yes    Comment: occasional wine   Drug use: No   Sexual activity: Not Currently    Birth control/protection: None  Other Topics Concern   Not on file  Social History Narrative   Not on file   Social Determinants of Health   Financial Resource Strain: Not on file  Food Insecurity: Not on file  Transportation Needs: Not on file  Physical Activity: Not on file  Stress: Not on file  Social Connections: Not on file  Intimate Partner Violence: Not on file   Family History  Problem Relation Age of Onset   Hypertension Mother    Hypertension Father    Diabetes Father    Hypertension Sister    Cancer Sister    Diabetes Brother    Diabetes Brother    Colon cancer Neg Hx     Objective: Office vital signs reviewed. BP 136/72   Pulse 70   Temp (!) 97.1 F (36.2 C)   Ht '5\' 4"'  (1.626 m)   Wt 142 lb 9.6  oz (64.7 kg)   SpO2 96%   BMI 24.48 kg/m   Physical Examination:  General: Awake, alert, well nourished, No acute distress HEENT: Normal; sclera white Cardio: regular rate and rhythm, S1S2 heard, no murmurs appreciated Pulm: clear to auscultation bilaterally, no wheezes, rhonchi or rales; normal work of breathing on room air Skin: Mildly erythematous rash under left breast with satellite lesions noted  Assessment/ Plan: 75 y.o. female   Type 2 diabetes mellitus with insulin therapy ([place]) - Plan: CMP14+EGFR, Bayer DCA Hb A1c Waived  Hypertension associated with type 2 diabetes mellitus ([place]) - Plan: CMP14+EGFR  Hyperlipidemia associated with type 2 diabetes mellitus ([place]) - Plan: CMP14+EGFR, Lipid Panel  Intertrigo - Plan: nystatin cream (MYCOSTATIN)  Sugar under better control and at goal for her age at 7.8 today.  I do worry about the hypoglycemic episodes and again offered to switch her medications today but she declined this.  I have asked that she pay close attention to her blood sugars and let us know if she is continuing to have hypoglycemic episodes.  We discussed goal blood sugar of A1c less than 7.9.  Blood pressure was at goal upon recheck.  HCTZ added for as needed edema  Continue statin.  Check fasting lipid panel, CMP  Rash consistent with intertrigo.  Nystatin cream prescribed.  Reinforced needing to keep the area dry  No orders of the defined types were placed in this encounter.  No orders of the defined types were placed in this encounter.    Janora Norlander, DO [place] [phone]

## 2021-03-07 LAB — LIPID PANEL
Chol/HDL Ratio: 1.8 ratio (ref 0.0–4.4)
Cholesterol, Total: 171 mg/dL (ref 100–199)
HDL: 94 mg/dL (ref >39)
LDL Chol Calc (NIH): 66 mg/dL (ref 0–99)
Triglycerides: 52 mg/dL (ref 0–149)
VLDL Cholesterol Cal: 11 mg/dL (ref 5–40)

## 2021-03-07 LAB — CMP14+EGFR
ALT: 17 IU/L (ref 0–32)
AST: 21 IU/L (ref 0–40)
Albumin/Globulin Ratio: 1.9 (ref 1.2–2.2)
Albumin: 4.5 g/dL (ref 3.7–4.7)
Alkaline Phosphatase: 76 IU/L (ref 44–121)
BUN/Creatinine Ratio: 14 (ref 12–28)
BUN: 13 mg/dL (ref 8–27)
Bilirubin Total: 0.4 mg/dL (ref 0.0–1.2)
CO2: 27 mmol/L (ref 20–29)
Calcium: 9.4 mg/dL (ref 8.7–10.3)
Chloride: 99 mmol/L (ref 96–106)
Creatinine, Ser: 0.91 mg/dL (ref 0.57–1.00)
Globulin, Total: 2.4 g/dL (ref 1.5–4.5)
Glucose: 152 mg/dL — ABNORMAL HIGH (ref 65–99)
Potassium: 4.5 mmol/L (ref 3.5–5.2)
Sodium: 140 mmol/L (ref 134–144)
Total Protein: 6.9 g/dL (ref 6.0–8.5)
eGFR: 65 mL/min/{1.73_m2} (ref >59)

## 2021-03-13 DIAGNOSIS — E1165 Type 2 diabetes mellitus with hyperglycemia: Secondary | ICD-10-CM | POA: Diagnosis not present

## 2021-03-15 ENCOUNTER — Ambulatory Visit (INDEPENDENT_AMBULATORY_CARE_PROVIDER_SITE_OTHER): Payer: Medicare Other | Admitting: Family Medicine

## 2021-03-15 ENCOUNTER — Encounter: Payer: Self-pay | Admitting: Family Medicine

## 2021-03-15 DIAGNOSIS — B001 Herpesviral vesicular dermatitis: Secondary | ICD-10-CM

## 2021-03-15 DIAGNOSIS — J019 Acute sinusitis, unspecified: Secondary | ICD-10-CM | POA: Diagnosis not present

## 2021-03-15 MED ORDER — AMOXICILLIN 875 MG PO TABS: 875.0000 mg | ORAL_TABLET | Freq: Two times a day (BID) | ORAL | 0 refills | Status: AC

## 2021-03-15 MED ORDER — CETIRIZINE HCL 5 MG PO TABS: 5.0000 mg | ORAL_TABLET | Freq: Every day | ORAL | 1 refills | Status: DC

## 2021-03-15 MED ORDER — VALACYCLOVIR HCL 1 G PO TABS: 1000.0000 mg | ORAL_TABLET | Freq: Two times a day (BID) | ORAL | 0 refills | Status: DC

## 2021-03-15 NOTE — Progress Notes (Signed)
   Virtual Visit  Note Due to COVID-19 pandemic this visit was conducted virtually. This visit type was conducted due to national recommendations for restrictions regarding the COVID-19 Pandemic (e.g. social distancing, sheltering in place) in an effort to limit this patient's exposure and mitigate transmission in our community. All issues noted in this document were discussed and addressed.  A physical exam was not performed with this format.  I connected with Susan Petty on 03/15/21 at 1244 by telephone and verified that I am speaking with the correct person using two identifiers. Susan Petty is currently located at home and no one is currently with her during the visit. The provider, Gwenlyn Perking, FNP is located in their office at time of visit.  I discussed the limitations, risks, security and privacy concerns of performing an evaluation and management service by telephone and the availability of in person appointments. I also discussed with the patient that there may be a patient responsible charge related to this service. The patient expressed understanding and agreed to proceed.  CC: sinusitis  History and Present Illness:  HPI Susan Petty reports congestion, ear pressure, headache, sneezing, throat irritation, and cough for 1 month. Her symptoms have continued to worsen. She denies fever, shortness of chest, chest pain, or wheezing. She has a history of allergies. She also reports that she typically get a sinus infection 1-2x a year and needs antibiotics for treatment. She has not tried anything for her symptoms. She has had negative Covid tests. She also has had a few cold sores on her mouth for 2-3 days. She has been given valtrex in the past but she is out of this.     ROS As per HPI.   Observations/Objective: Alert and oriented x 3. Able to speak in full sentences without difficulty.   Assessment and Plan: Susan Petty was seen today for sinusitis.  Diagnoses and all orders for  this visit:  Subacute sinusitis, unspecified location Amoxicillin as below. Zyrtec daily. Discussed using flonase that she has at home as well.  -     cetirizine (ZYRTEC) 5 MG tablet; Take 1 tablet (5 mg total) by mouth daily. -     amoxicillin (AMOXIL) 875 MG tablet; Take 1 tablet (875 mg total) by mouth 2 (two) times daily for 10 days.  Cold sore Valtrex refilled.  -     valACYclovir (VALTREX) 1000 MG tablet; Take 1 tablet (1,000 mg total) by mouth 2 (two) times daily.    Follow Up Instructions: Return to office for new or worsening symptoms, or if symptoms persist.     I discussed the assessment and treatment plan with the patient. The patient was provided an opportunity to ask questions and all were answered. The patient agreed with the plan and demonstrated an understanding of the instructions.   The patient was advised to call back or seek an in-person evaluation if the symptoms worsen or if the condition fails to improve as anticipated.  The above assessment and management plan was discussed with the patient. The patient verbalized understanding of and has agreed to the management plan. Patient is aware to call the clinic if symptoms persist or worsen. Patient is aware when to return to the clinic for a follow-up visit. Patient educated on when it is appropriate to go to the emergency department.   Time call ended:  1256  I provided 12 minutes of  non face-to-face time during this encounter.    Gwenlyn Perking, FNP

## 2021-03-20 ENCOUNTER — Other Ambulatory Visit: Payer: Self-pay | Admitting: Family Medicine

## 2021-03-20 DIAGNOSIS — E119 Type 2 diabetes mellitus without complications: Secondary | ICD-10-CM

## 2021-03-21 ENCOUNTER — Telehealth: Payer: Self-pay

## 2021-03-26 ENCOUNTER — Other Ambulatory Visit: Payer: Self-pay | Admitting: Family Medicine

## 2021-03-26 DIAGNOSIS — E785 Hyperlipidemia, unspecified: Secondary | ICD-10-CM

## 2021-03-26 DIAGNOSIS — E1165 Type 2 diabetes mellitus with hyperglycemia: Secondary | ICD-10-CM

## 2021-03-26 DIAGNOSIS — E119 Type 2 diabetes mellitus without complications: Secondary | ICD-10-CM

## 2021-03-26 DIAGNOSIS — E1169 Type 2 diabetes mellitus with other specified complication: Secondary | ICD-10-CM

## 2021-03-26 DIAGNOSIS — E1159 Type 2 diabetes mellitus with other circulatory complications: Secondary | ICD-10-CM

## 2021-03-28 DIAGNOSIS — Z23 Encounter for immunization: Secondary | ICD-10-CM | POA: Diagnosis not present

## 2021-03-30 ENCOUNTER — Ambulatory Visit (INDEPENDENT_AMBULATORY_CARE_PROVIDER_SITE_OTHER): Payer: Medicare Other | Admitting: Pharmacist

## 2021-03-30 DIAGNOSIS — Z794 Long term (current) use of insulin: Secondary | ICD-10-CM

## 2021-03-30 MED ORDER — OMEPRAZOLE 40 MG PO CPDR: 40.0000 mg | DELAYED_RELEASE_CAPSULE | Freq: Two times a day (BID) | ORAL | 3 refills | Status: DC

## 2021-03-30 NOTE — Progress Notes (Signed)
Chronic Care Management Pharmacy Note  03/30/2021 Name:  Susan Petty MRN:  [id number] DOB:  11/30/1945  Summary: T2DM  Recommendations/Changes made from today's visit: Diabetes: Uncontrolled; current treatment: HUMULIN N 65 UNITS IN AM, 5 UNITS IN PM; NOVOLIN R 3 UNITS 3X DAILY WITH MEALS, METFORMIN Patient does not wish to switch regimen at this time-she has been on these insulins for decades, we can continue to address Encouraged patient to consider switching to basal regimen with potential GLP1/SGTL2 for steady/improve glycemic control Patient wearing Libre 2 CGM; education provided; sample has been provided due to sensor malfunctions Having CGM is making patient more aware of what she is eating and how she is taking insulin Gfr 56, A1c 8.2% increasing Current glucose readings: fasting glucose: <150, post prandial glucose: varies Reports hypoglycemic/hyperglycemic symptoms Discussed meal planning options and Plate method for healthy eating Avoid sugary drinks and desserts Incorporate balanced protein, non starchy veggies, 1 serving of carbohydrate with each meal Increase water intake Increase physical activity as able Current exercise: n/a-does not walk like she used to due to age Educated on libre CGM 2 --gets via mail from [facility]  Follow Up Plan: Telephone follow up appointment with care management team member scheduled for: Face to Face appointment with care management team member scheduled for:  1 month   Subjective: Susan Petty is an 75 y.o. year old female who is a primary patient of Janora Norlander, DO.  The CCM team was consulted for assistance with disease management and care coordination needs.    Engaged with patient by telephone for follow up visit in response to provider referral for pharmacy case management and/or care coordination services.   Consent to Services:  The patient was given information about Chronic Care Management  services, agreed to services, and gave verbal consent prior to initiation of services.  Please see initial visit note for detailed documentation.   Patient Care Team: Janora Norlander, DO as PCP - General (Family Medicine) Danie Binder, MD (Inactive) as Consulting Physician (Gastroenterology) Lavera Guise, [facility] (Pharmacist) Eloise Harman, DO as Consulting Physician (Internal Medicine)  Objective:  Lab Results  Component Value Date   CREATININE 0.91 03/06/2021   CREATININE 0.99 08/28/2020   CREATININE 0.77 05/18/2020    Lab Results  Component Value Date   HGBA1C 7.8 (H) 03/06/2021   Last diabetic Eye exam: No results found for: HMDIABEYEEXA  Last diabetic Foot exam: No results found for: HMDIABFOOTEX      Component Value Date/Time   CHOL 171 03/06/2021 0958   CHOL 180 12/14/2012 1611   TRIG 52 03/06/2021 0958   TRIG 102 08/29/2014 1336   TRIG 85 12/14/2012 1611   HDL 94 03/06/2021 0958   HDL 77 08/29/2014 1336   HDL 72 12/14/2012 1611   CHOLHDL 1.8 03/06/2021 0958   LDLCALC 66 03/06/2021 0958   LDLCALC 101 (H) 01/11/2014 0837   LDLCALC 91 12/14/2012 1611    Hepatic Function Latest Ref Rng & Units 03/06/2021 05/18/2020 08/20/2019  Total Protein 6.0 - 8.5 g/dL 6.9 7.0 6.7  Albumin 3.7 - 4.7 g/dL 4.5 4.5 4.3  AST 0 - 40 IU/L '21 22 21  ' ALT 0 - 32 IU/L '17 14 12  ' Alk Phosphatase 44 - 121 IU/L 76 86 88  Total Bilirubin 0.0 - 1.2 mg/dL 0.4 0.3 <0.2    Lab Results  Component Value Date/Time   TSH 4.110 08/28/2020 01:02 PM   TSH 4.990 (H)  05/18/2020 08:54 AM    CBC Latest Ref Rng & Units 11/22/2019 08/20/2019 05/19/2019  WBC 3.4 - 10.8 x10E3/uL - 6.0 5.2  Hemoglobin 11.1 - 15.9 g/dL - 12.6 11.5  Hematocrit 34.0 - 46.6 % 35.9 36.4 35.2  Platelets 150 - 450 x10E3/uL - 224 171    Lab Results  Component Value Date/Time   VD25OH 59.3 05/18/2020 08:54 AM   VD25OH 70.9 12/11/2018 09:29 AM    Clinical ASCVD: No  The 10-year ASCVD risk score (Arnett DK, et  al., 2019) is: 49.5%   Values used to calculate the score:     Age: 57 years     Sex: Female     Is Non-Hispanic African American: No     Diabetic: Yes     Tobacco smoker: No     Systolic Blood Pressure: 967 mmHg     Is BP treated: Yes     HDL Cholesterol: 94 mg/dL     Total Cholesterol: 171 mg/dL    Other: (CHADS2VASc if Afib, PHQ9 if depression, MMRC or CAT for COPD, ACT, DEXA)  Social History   Tobacco Use  Smoking Status Never  Smokeless Tobacco Never   BP Readings from Last 3 Encounters:  03/06/21 136/72  08/28/20 (!) 142/55  05/18/20 (!) 171/64   Pulse Readings from Last 3 Encounters:  03/06/21 70  08/28/20 65  05/18/20 62   Wt Readings from Last 3 Encounters:  03/06/21 142 lb 9.6 oz (64.7 kg)  08/28/20 141 lb (64 kg)  05/18/20 138 lb 12.8 oz (63 kg)    Assessment: Review of patient past medical history, allergies, medications, health status, including review of consultants reports, laboratory and other test data, was performed as part of comprehensive evaluation and provision of chronic care management services.   SDOH:  (Social Determinants of Health) assessments and interventions performed:    CCM Care Plan  Allergies  Allergen Reactions   Sulfa Antibiotics    Sulfa Drugs Cross Reactors Rash    Medications Reviewed Today     Reviewed by Lavera Guise, [facility] (Pharmacist) on 03/30/21 at 85  Med List Status: <None>   Medication Order Taking? Sig Documenting Provider Last Dose Status Informant  alendronate (FOSAMAX) 70 MG tablet [id number] No TAKE ONE TABLET BY MOUTH ONCE A WEEK WITH A FULL GLASS OF WATER ON AN EMPTY STOMACH Ronnie Doss M, DO Taking Active   amLODipine (NORVASC) 10 MG tablet [id number]  TAKE 1 TABLET BY MOUTH ONCE DAILY STOP  HCTZ Ronnie Doss M, DO  Active   aspirin EC 81 MG tablet [id number] No Take 81 mg by mouth daily. [provider] Taking Active Self  benazepril (LOTENSIN) 40 MG tablet [id number]  Take 1 tablet by  mouth once daily Gottschalk, Ashly M, DO  Active   blood glucose meter kit and supplies [id number] No Dispense based on patient and insurance preference. Use up to four times daily as directed. (FOR ICD-10 E10.9, E11.9). Baruch Gouty, FNP Taking Active   cetirizine (ZYRTEC) 5 MG tablet [id number]  Take 1 tablet (5 mg total) by mouth daily. Gwenlyn Perking, FNP  Active   Cholecalciferol (VITAMIN D-3 PO) [id number] No Take 2,000 Units by mouth daily. [provider] Taking Active   Continuous Blood Gluc Sensor (FREESTYLE LIBRE 2 SENSOR) MISC [id number] No by Does not apply route. [provider] Taking Active   FLUoxetine (PROZAC) 10 MG capsule [id number] No Take 1 capsule (10 mg total) by mouth daily. Ronnie Doss  M, DO Taking Active   fluticasone (FLONASE) 50 MCG/ACT nasal spray [id number] No Place 2 sprays into both nostrils daily. Eustaquio Maize, MD Taking Active   glucose blood (ACCU-CHEK AVIVA PLUS) test strip [id number]  CHECK GLUCOSE SIX TIMES DAILY Dx E11.9 Ronnie Doss M, DO  Active   hydrochlorothiazide (MICROZIDE) 12.5 MG capsule [id number]  Take 1 capsule (12.5 mg total) by mouth daily as needed (fluid). Ronnie Doss M, DO  Active   insulin NPH Human (HUMULIN N) 100 UNIT/ML injection [id number]  INJECT 30 UNITS SUBCUTANEOUSLY IN THE MORNING AND INJECT 5 UNITS IN THE [place], Ashly M, DO  Active   insulin regular (HUMULIN R) 100 units/mL injection [id number] No Inject 0.03 mLs (3 Units total) into the skin 3 (three) times daily before meals. (NEEDS TO BE SEEN BEFORE NEXT REFILL) Janora Norlander, DO Taking Active   LINZESS 290 MCG CAPS capsule [id number] No TAKE 1 CAPSULE BY MOUTH ONCE DAILY BEFORE BREAKFAST Carlis Stable, NP Taking Active   metFORMIN (GLUCOPHAGE) 500 MG tablet [id number]  TAKE 2 TABLETS BY MOUTH TWICE DAILY WITH A MEAL Gottschalk, Ashly M, DO  Active   metoprolol tartrate (LOPRESSOR) 25 MG tablet [id number]  Take 1 tablet by mouth twice  daily Gottschalk, Ashly M, DO  Active   nystatin cream (MYCOSTATIN) [id number]  Apply 1 application topically 2 (two) times daily. X7-10 days Ronnie Doss M, DO  Active   ondansetron (ZOFRAN) 4 MG tablet [id number] No Take 1 tablet (4 mg total) by mouth every 8 (eight) hours as needed for nausea or vomiting. Mahala Menghini, PA-C Taking Active   pantoprazole (PROTONIX) 40 MG tablet [id number] No Take one tablet before breakfast and one tablet before evening snack. Mahala Menghini, PA-C Taking Active   pravastatin (PRAVACHOL) 40 MG tablet [id number]  Take 1 tablet by mouth once daily Gottschalk, Ashly M, DO  Active   psyllium (METAMUCIL) 58.6 % powder [id number] No Take 1 packet by mouth daily. [provider] Taking Active   valACYclovir (VALTREX) 1000 MG tablet [id number]  Take 1 tablet (1,000 mg total) by mouth 2 (two) times daily. Gwenlyn Perking, FNP  Active             Patient Active Problem List   Diagnosis Date Noted   Dysuria 08/15/2020   Age-related osteoporosis without current pathological fracture 12/11/2018   Constipation 06/19/2016   Allergic rhinitis 04/22/2016   Bowel habit changes 03/20/2016   Bloating 03/20/2016   Body mass index (BMI) of 23.0-23.9 in adult 07/20/2015   GAD (generalized anxiety disorder) 03/14/2015   GERD (gastroesophageal reflux disease) 12/14/2012   Type 2 diabetes mellitus with insulin therapy ([place]) 12/14/2012   Hyperlipidemia associated with type 2 diabetes mellitus ([place]) 12/14/2012   Hypertension associated with type 2 diabetes mellitus ([place]) 12/14/2012   S/P left THA, 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    Immunization History  Administered Date(s) Administered   Fluad Quad(high Dose 65+) 04/21/2019, 05/18/2020   Influenza, High Dose Seasonal PF 05/05/2013, 04/08/2017, 04/14/2018   Influenza,inj,Quad PF,6+ Mos 04/13/2014   Influenza-Unspecified 04/20/2012   Moderna Sars-Covid-2 Vaccination 09/13/2020   Pneumococcal Conjugate-13 03/14/2015    Pneumococcal Polysaccharide-23 04/14/2018   Td 10/18/1996    Conditions to be addressed/monitored: DMII  Care Plan : PHARMD MEDICATION MANAGEMENT  Updates made by Lavera Guise, [place] since 04/03/2021 12:00 AM     Problem: DISEASE PROGRESSION PREVENTION      Long-Range Goal: T2DM   Recent Progress: Not on  track  Priority: High  Note:   Current Barriers:  Unable to independently monitor therapeutic efficacy Unable to maintain control of T2DM  Pharmacist Clinical Goal(s):  Over the next 90 days, patient will achieve adherence to monitoring guidelines and medication adherence to achieve therapeutic efficacy achieve control of T2DM as evidenced by GOAL A1C<7%, IMPROVED GLYCEMIC CONTROL, AVOID HYPOGLYCEMIA  through collaboration with PharmD and provider.    Interventions: 1:1 collaboration with Janora Norlander, DO regarding development and update of comprehensive plan of care as evidenced by provider attestation and co-signature Inter-disciplinary care team collaboration (see longitudinal plan of care) Comprehensive medication review performed; medication list updated in electronic medical record  Diabetes: Uncontrolled; current treatment: HUMULIN N 30 UNITS IN AM, 5 UNITS IN PM; NOVOLIN R 3 UNITS 3X DAILY WITH MEALS, METFORMIN Patient does not wish to switch regimen at this time-she has been on these insulins for decades, we can continue to address Encouraged patient to consider switching to basal regimen with potential GLP1/SGTL2 for steady/improve glycemic control Patient wearing Libre 2 CGM; education provided; sample has been provided due to sensor malfunctions Having CGM is making patient more aware of what she is eating and how she is taking insulin Gfr 56, A1c 8.2% increasing Current glucose readings: fasting glucose: <150, post prandial glucose: varies Reports hypoglycemic/hyperglycemic symptoms Discussed meal planning options and Plate method for healthy eating Avoid  sugary drinks and desserts Incorporate balanced protein, non starchy veggies, 1 serving of carbohydrate with each meal Increase water intake Increase physical activity as able Current exercise: n/a-does not walk like she used to due to age Educated on [place] CGM  2 --gets via mail from [facility]  Patient Goals/Self-Care Activities Over the next 90 days, patient will:  - take medications as prescribed check glucose daily, document, and provide at future appointments  Follow Up Plan: Telephone follow up appointment with care management team member scheduled for: Face to Face appointment with care management team member scheduled for:  1 month      Medication Assistance: None required.  Patient affirms current coverage meets needs.  Patient's preferred pharmacy is:  [facility] [address] [place] [address] [place] [address] Phone: [phone] Fax: [phone]   Follow Up:  Patient agrees to Care Plan and Follow-up.  Plan: Telephone follow up appointment with care management team member scheduled for:  2 MONTHS   Regina Eck, PharmD, BCPS Clinical Pharmacist, [place]  II Phone [phone]

## 2021-04-03 NOTE — Patient Instructions (Signed)
Visit Information  PATIENT GOALS:  Goals Addressed               This Visit's Progress     Patient Stated     T2DM (pt-stated)        Current Barriers:  Unable to independently monitor therapeutic efficacy Unable to maintain control of T2DM  Pharmacist Clinical Goal(s):  Over the next 90 days, patient will achieve adherence to monitoring guidelines and medication adherence to achieve therapeutic efficacy achieve control of T2DM as evidenced by GOAL A1C<7%, IMPROVED GLYCEMIC CONTROL, AVOID HYPOGLYCEMIA through collaboration with PharmD and provider.    Interventions: 1:1 collaboration with Janora Norlander, DO regarding development and update of comprehensive plan of care as evidenced by provider attestation and co-signature Inter-disciplinary care team collaboration (see longitudinal plan of care) Comprehensive medication review performed; medication list updated in electronic medical record  Diabetes: Uncontrolled; current treatment: HUMULIN N 30 UNITS IN AM, 5 UNITS IN PM; NOVOLIN R 3 UNITS 3X DAILY WITH MEALS, METFORMIN Patient does not wish to switch regimen at this time-she has been on these insulins for decades, we can continue to address Encouraged patient to consider switching to basal regimen with potential GLP1/SGTL2 for steady/improve glycemic control Patient wearing Libre 2 CGM; education provided; sample has been provided due to sensor malfunctions Having CGM is making patient more aware of what she is eating and how she is taking insulin Gfr 56, A1c 8.2% increasing Current glucose readings: fasting glucose: <150, post prandial glucose: varies Reports hypoglycemic/hyperglycemic symptoms Discussed meal planning options and Plate method for healthy eating Avoid sugary drinks and desserts Incorporate balanced protein, non starchy veggies, 1 serving of carbohydrate with each meal Increase water intake Increase physical activity as able Current exercise: n/a-does  not walk like she used to due to age 45 on libre CGM 2 --gets via mail from [facility]  Patient Goals/Self-Care Activities Over the next 90 days, patient will:  - take medications as prescribed check glucose daily, document, and provide at future appointments  Follow Up Plan: Telephone follow up appointment with care management team member scheduled for: Face to Face appointment with care management team member scheduled for:  1 month         The patient verbalized understanding of instructions, educational materials, and care plan provided today and declined offer to receive copy of patient instructions, educational materials, and care plan.   Telephone follow up appointment with care management team member scheduled for: 2 MONTHS  Regina Eck, PharmD, BCPS Clinical Pharmacist, [place]  II Phone [phone]

## 2021-04-04 ENCOUNTER — Telehealth: Payer: Self-pay | Admitting: Family Medicine

## 2021-04-04 DIAGNOSIS — E1165 Type 2 diabetes mellitus with hyperglycemia: Secondary | ICD-10-CM | POA: Diagnosis not present

## 2021-04-04 NOTE — Telephone Encounter (Signed)
Pt is calling to check on the status of her sensor. She stated that [facility]my[facility] was ordering one for her. Please call back and advise.

## 2021-04-05 NOTE — Telephone Encounter (Signed)
Pt aware and voiced understanding 

## 2021-04-05 NOTE — Telephone Encounter (Signed)
Pt calling about this again today.

## 2021-04-05 NOTE — Telephone Encounter (Signed)
She needs to call advanced diabetes supply to refill. If they are shipping here--we have not received it  Patient must call  [phone]  She can fingerstick (traditional testing) if she does not have sensor available

## 2021-04-06 DIAGNOSIS — Z794 Long term (current) use of insulin: Secondary | ICD-10-CM

## 2021-04-06 DIAGNOSIS — E119 Type 2 diabetes mellitus without complications: Secondary | ICD-10-CM | POA: Diagnosis not present

## 2021-04-25 ENCOUNTER — Ambulatory Visit (INDEPENDENT_AMBULATORY_CARE_PROVIDER_SITE_OTHER): Payer: Medicare Other

## 2021-04-25 VITALS — Ht 64.0 in | Wt 143.0 lb

## 2021-04-25 DIAGNOSIS — Z0000 Encounter for general adult medical examination without abnormal findings: Secondary | ICD-10-CM | POA: Diagnosis not present

## 2021-04-25 NOTE — Progress Notes (Signed)
Subjective:   Susan Petty is a 75 y.o. female who presents for Medicare Annual (Subsequent) preventive examination.  Virtual Visit via Telephone Note  I connected with  Susan Petty on 04/25/21 at  9:45 AM EDT by telephone and verified that I am speaking with the correct person using two identifiers.  Location: Patient: Home Provider: [facility] Persons participating in the virtual visit: patient/Nurse Health Advisor   I discussed the limitations, risks, security and privacy concerns of performing an evaluation and management service by telephone and the availability of in person appointments. The patient expressed understanding and agreed to proceed.  Interactive audio and video telecommunications were attempted between this nurse and patient, however failed, due to patient having technical difficulties OR patient did not have access to video capability.  We continued and completed visit with audio only.  Some vital signs may be absent or patient reported.   Rosabel Sermeno E Miron Marxen, LPN   Review of Systems     Cardiac Risk Factors include: advanced age (>64mn, >>96women);diabetes mellitus;dyslipidemia;hypertension     Objective:    Today's Vitals   04/25/21 0953  Weight: 143 lb (64.9 kg)  Height: 5' 4" (1.626 m)   Body mass index is 24.55 kg/m.  Advanced Directives 04/25/2021 04/19/2020 04/19/2019 04/14/2018 03/29/2016 09/03/2011 09/03/2011  Does Patient Have a Medical Advance Directive? _0  Patient does not have advance directive;Patient would not like information -  Would patient like information on creating a medical advance directive? No - Patient declined No - Patient declined No - Patient declined Yes (MAU/Ambulatory/Procedural Areas - Information given) No - patient declined information - -  Pre-existing out of facility DNR order (yellow form or pink MOST form) - - - - - No No    Current Medications (verified) Outpatient Encounter Medications as of 04/25/2021   Medication Sig   alendronate (FOSAMAX) 70 MG tablet TAKE ONE TABLET BY MOUTH ONCE A WEEK WITH A FULL GLASS OF WATER ON AN EMPTY STOMACH   amLODipine (NORVASC) 10 MG tablet TAKE 1 TABLET BY MOUTH ONCE DAILY STOP  HCTZ   aspirin EC 81 MG tablet Take 81 mg by mouth daily.   benazepril (LOTENSIN) 40 MG tablet Take 1 tablet by mouth once daily   cetirizine (ZYRTEC) 5 MG tablet Take 1 tablet (5 mg total) by mouth daily.   Cholecalciferol (VITAMIN D-3 PO) Take 2,000 Units by mouth daily.   Continuous Blood Gluc Sensor (FREESTYLE LIBRE 2 SENSOR) MISC by Does not apply route.   FLUoxetine (PROZAC) 10 MG capsule Take 1 capsule (10 mg total) by mouth daily.   fluticasone (FLONASE) 50 MCG/ACT nasal spray Place 2 sprays into both nostrils daily.   hydrochlorothiazide (MICROZIDE) 12.5 MG capsule Take 1 capsule (12.5 mg total) by mouth daily as needed (fluid).   insulin NPH Human (HUMULIN N) 100 UNIT/ML injection INJECT 30 UNITS SUBCUTANEOUSLY IN THE MORNING AND INJECT 5 UNITS IN THE EVENING   insulin regular (HUMULIN R) 100 units/mL injection Inject 0.03 mLs (3 Units total) into the skin 3 (three) times daily before meals. (NEEDS TO BE SEEN BEFORE NEXT REFILL)   LINZESS 290 MCG CAPS capsule TAKE 1 CAPSULE BY MOUTH ONCE DAILY BEFORE BREAKFAST   metFORMIN (GLUCOPHAGE) 500 MG tablet TAKE 2 TABLETS BY MOUTH TWICE DAILY WITH A MEAL   metoprolol tartrate (LOPRESSOR) 25 MG tablet Take 1 tablet by mouth twice daily   nystatin cream (MYCOSTATIN) Apply 1 application topically 2 (two) times daily. X7-10  days   omeprazole (PRILOSEC) 40 MG capsule Take 1 capsule (40 mg total) by mouth 2 (two) times daily.   ondansetron (ZOFRAN) 4 MG tablet Take 1 tablet (4 mg total) by mouth every 8 (eight) hours as needed for nausea or vomiting.   pravastatin (PRAVACHOL) 40 MG tablet Take 1 tablet by mouth once daily   psyllium (METAMUCIL) 58.6 % powder Take 1 packet by mouth daily.   valACYclovir (VALTREX) 1000 MG tablet Take 1 tablet  (1,000 mg total) by mouth 2 (two) times daily.   blood glucose meter kit and supplies Dispense based on patient and insurance preference. Use up to four times daily as directed. (FOR ICD-10 E10.9, E11.9). (Patient not taking: Reported on 04/25/2021)   glucose blood (ACCU-CHEK AVIVA PLUS) test strip CHECK GLUCOSE SIX TIMES DAILY Dx E11.9 (Patient not taking: Reported on 04/25/2021)   No facility-administered encounter medications on file as of 04/25/2021.    Allergies (verified) Sulfa antibiotics and Sulfa drugs cross reactors   History: Past Medical History:  Diagnosis Date   Diabetes mellitus    GERD (gastroesophageal reflux disease)    Hyperlipidemia    Hypertension    Osteoporosis    Past Surgical History:  Procedure Laterality Date   COLONOSCOPY N/A 03/29/2016   Dr. Oneida Alar: Redundant left colon, nonbleeding internal hemorrhoids.  Consider colonoscopy in 10 to 15 years if benefits outweigh the risk    ESOPHAGOGASTRODUODENOSCOPY N/A 03/29/2016   Dr. Oneida Alar: Small hiatal hernia, small sessile polyps found in the gastric fundus, benign fundic gland polyps, mild inflammation and erythema in the gastric antrum.  Duodenum was normal.  Biopsy negative for celiac.  Schatzki ring, patent.   TOTAL HIP ARTHROPLASTY  09/03/2011   Procedure: TOTAL HIP ARTHROPLASTY;  Surgeon: Mauri Pole, MD;  Location: [facility];  Service: Orthopedics;  Laterality: Left;   Family History  Problem Relation Age of Onset   Hypertension Mother    Hypertension Father    Diabetes Father    Hypertension Sister    Cancer Sister    Diabetes Brother    Diabetes Brother    Colon cancer Neg Hx    Social History   Socioeconomic History   Marital status: Widowed    Spouse name: Not on file   Number of children: 2   Years of education: 12   Highest education level: High school graduate  Occupational History   Occupation: retired  Tobacco Use   Smoking status: Never   Smokeless tobacco: Never  Vaping Use    Vaping Use: Never used  Substance and Sexual Activity   Alcohol use: Yes    Comment: occasional wine   Drug use: No   Sexual activity: Not Currently    Birth control/protection: None  Other Topics Concern   Not on file  Social History Narrative   Lives alone. Daughters visit frequently   Social Determinants of Health   Financial Resource Strain: Low Risk    Difficulty of Paying Living Expenses: Not hard at all  Food Insecurity: No Food Insecurity   Worried About Charity fundraiser in the Last Year: Never true   Arboriculturist in the Last Year: Never true  Transportation Needs: No Transportation Needs   Lack of Transportation (Medical): No   Lack of Transportation (Non-Medical): No  Physical Activity: Insufficiently Active   Days of Exercise per Week: 4 days   Minutes of Exercise per Session: 30 min  Stress: No Stress Concern Present   Feeling  of Stress : Only a little  Social Connections: Moderately Integrated   Frequency of Communication with Friends and Family: More than three times a week   Frequency of Social Gatherings with Friends and Family: More than three times a week   Attends Religious Services: More than 4 times per year   Active Member of [employer] or Organizations: Yes   Attends Archivist Meetings: More than 4 times per year   Marital Status: Widowed    Tobacco Counseling Counseling given: Not Answered   Clinical Intake:  Pre-visit preparation completed: Yes  Pain : No/denies pain     BMI - recorded: 24.55 Nutritional Status: BMI of 19-24  Normal Nutritional Risks: None Diabetes: Yes CBG done?: No Did pt. bring in CBG monitor from home?: No  How often do you need to have someone help you when you read instructions, pamphlets, or other written materials from your doctor or pharmacy?: 1 - Never  Diabetic? Yes Nutrition Risk Assessment:  Has the patient had any N/V/D within the last 2 months?  No  Does the patient have any non-healing  wounds?  No  Has the patient had any unintentional weight loss or weight gain?  No   Diabetes:  Is the patient diabetic?  Yes  If diabetic, was a CBG obtained today?  No  Did the patient bring in their glucometer from home?  No  How often do you monitor your CBG's? Several times per day - has a CGM.   Financial Strains and Diabetes Management:  Are you having any financial strains with the device, your supplies or your medication? No .  Does the patient want to be seen by Chronic Care Management for management of their diabetes?  No  Would the patient like to be referred to a Nutritionist or for Diabetic Management?  No   Diabetic Exams:  Diabetic Eye Exam: Overdue for diabetic eye exam. Pt has been advised about the importance in completing this exam. A referral has been placed today. Refused referral - says she will go see Dr Marin Comment soon  Diabetic Foot Exam: Completed 05/18/2020. Pt has been advised about the importance in completing this exam. Pt is scheduled for diabetic foot exam on next visit.    Interpreter Needed?: No  Information entered by :: Marqui Formby, LPN   Activities of Daily Living In your present state of health, do you have any difficulty performing the following activities: 04/25/2021  Petty? N  Vision? N  Difficulty concentrating or making decisions? N  Walking or climbing stairs? Y  Dressing or bathing? N  Doing errands, shopping? N  Preparing Food and eating ? N  Using the Toilet? N  In the past six months, have you accidently leaked urine? N  Do you have problems with loss of bowel control? N  Managing your Medications? N  Managing your Finances? N  Housekeeping or managing your Housekeeping? N  Some recent data might be hidden    Patient Care Team: Janora Norlander, DO as PCP - General (Family Medicine) Danie Binder, MD (Inactive) as Consulting Physician (Gastroenterology) Lavera Guise, [facility] (Pharmacist) Eloise Harman, DO as  Consulting Physician (Internal Medicine)  Indicate any recent Medical Services you may have received from other than [facility] providers in the past year (date may be approximate).     Assessment:   This is a routine wellness examination for Susan Petty.  Petty/Vision screen Petty Screening - Comments:: Denies Petty difficulties  Vision Screening -  Comments:: Wears reading glasses prn only - behind on annual eye exams with [facility] in [place] issues and exercise activities discussed: Current Exercise Habits: Home exercise routine, Type of exercise: walking, Time (Minutes): 30, Frequency (Times/Week): 5, Weekly Exercise (Minutes/Week): 150, Intensity: Mild, Exercise limited by: neurologic condition(s);orthopedic condition(s)   Goals Addressed             This Visit's Progress    DIET - INCREASE WATER INTAKE   On track    Try to drink 6-8 glasses of water daily.       Depression Screen PHQ 2/9 Scores 04/25/2021 03/06/2021 08/28/2020 05/18/2020 11/22/2019 08/11/2019 05/19/2019  PHQ - 2 Score _0 0 1 0 0  PHQ- 9 Score _1 - 6 - -    Fall Risk Fall Risk  04/25/2021 03/06/2021 05/18/2020 04/19/2020 11/22/2019  Falls in the past year? 0 0 0 0 0  Number falls in past yr: 0 - - - -  Injury with Fall? 0 - - - -  Risk for fall due to : Orthopedic patient - - - -  Follow up Falls prevention discussed - - - -  Comment - - - - -    [place]:  Any stairs in or around the home? No  If so, are there any without handrails? No  Home free of loose throw rugs in walkways, pet beds, electrical cords, etc? Yes  Adequate lighting in your home to reduce risk of falls? Yes   ASSISTIVE DEVICES UTILIZED TO PREVENT FALLS:  Life alert? No  Use of a cane, walker or w/c? No  Grab bars in the bathroom? No  Shower chair or bench in shower? Yes  Elevated toilet seat or a handicapped toilet? Yes   TIMED UP AND GO:  Was the test performed? No .  Telephonic visit  Cognitive Function: Normal cognitive status assessed by direct observation by this Nurse Health Advisor. No abnormalities found.    MMSE - Mini Mental State Exam 04/14/2018  Orientation to time 5  Orientation to Place 5  Registration 3  Attention/ Calculation 5  Recall 2  Language- name 2 objects 2  Language- repeat 1  Language- follow 3 step command 3  Language- read & follow direction 1  Write a sentence 1  Copy design 1  Total score 29     6CIT Screen 04/19/2020 04/19/2019  What Year? 0 points 0 points  What month? 0 points 0 points  What time? 0 points 0 points  Count back from 20 0 points 0 points  Months in reverse 0 points 0 points  Repeat phrase 0 points 0 points  Total Score 0 0    Immunizations Immunization History  Administered Date(s) Administered   Fluad Quad(high Dose 65+) 04/21/2019, 05/18/2020   Influenza, High Dose Seasonal PF 05/05/2013, 04/08/2017, 04/14/2018   Influenza,inj,Quad PF,6+ Mos 04/13/2014   Influenza-Unspecified 04/20/2012   Moderna Sars-Covid-2 Vaccination 09/13/2020, 11/07/2020, 03/28/2021   Pneumococcal Conjugate-13 03/14/2015   Pneumococcal Polysaccharide-23 04/14/2018   Td 10/18/1996    TDAP status: Up to date  Flu Vaccine status: Due, Education has been provided regarding the importance of this vaccine. Advised may receive this vaccine at local pharmacy or Health Dept. Aware to provide a copy of the vaccination record if obtained from local pharmacy or Health Dept. Verbalized acceptance and understanding.  Pneumococcal vaccine status: Up to date  Covid-19 vaccine status: Completed vaccines  Qualifies for  Shingles Vaccine? Yes   Zostavax completed No   Shingrix Completed?: No.    Education has been provided regarding the importance of this vaccine. Patient has been advised to call insurance company to determine out of pocket expense if they have not yet received this vaccine. Advised may also receive vaccine at  local pharmacy or Health Dept. Verbalized acceptance and understanding.  Screening Tests Health Maintenance  Topic Date Due   OPHTHALMOLOGY EXAM  08/19/2015   INFLUENZA VACCINE  02/05/2021   Zoster Vaccines- Shingrix (1 of 2) 06/06/2021 (Originally [birth year]/1993)   FOOT EXAM  05/18/2021   MAMMOGRAM  07/05/2021   COVID-19 Vaccine (4 - Booster for Moderna series) 07/28/2021   HEMOGLOBIN A1C  09/04/2021   TETANUS/TDAP  05/16/2022   DEXA SCAN  05/18/2022   Pneumonia Vaccine 50+ Years old  Completed   Hepatitis C Screening  Completed   HPV VACCINES  Aged Out   PAP SMEAR-Modifier  Discontinued    Health Maintenance  Health Maintenance Due  Topic Date Due   OPHTHALMOLOGY EXAM  08/19/2015   INFLUENZA VACCINE  02/05/2021    Colorectal cancer screening: No longer required.   Mammogram status: Completed 07/05/2020. Repeat every year  Bone Density status: Completed 05/18/2020. Results reflect: Bone density results: OSTEOPOROSIS. Repeat every 2 years.  Lung Cancer Screening: (Low Dose CT Chest recommended if Age 29-80 years, 30 pack-year currently smoking OR have quit w/in 15years.) does not qualify.   Additional Screening:  Hepatitis C Screening: does qualify; Completed 06/29/2015  Vision Screening: Recommended annual ophthalmology exams for early detection of glaucoma and other disorders of the eye. Is the patient up to date with their annual eye exam?  No  Who is the provider or what is the name of the office in which the patient attends annual eye exams? [facility] If pt is not established with a provider, would they like to be referred to a provider to establish care? No .   Dental Screening: Recommended annual dental exams for proper oral hygiene  Community Resource Referral / Chronic Care Management: CRR required this visit?  No   CCM required this visit?  No      Plan:     I have personally reviewed and noted the following in the patient's chart:   Medical and  social history Use of alcohol, tobacco or illicit drugs  Current medications and supplements including opioid prescriptions.  Functional ability and status Nutritional status Physical activity Advanced directives List of other physicians Hospitalizations, surgeries, and ER visits in previous 12 months Vitals Screenings to include cognitive, depression, and falls Referrals and appointments  In addition, I have reviewed and discussed with patient certain preventive protocols, quality metrics, and best practice recommendations. A written personalized care plan for preventive services as well as general preventive health recommendations were provided to patient.     Sandrea Hammond, LPN   [id number]   Nurse Notes: None

## 2021-04-25 NOTE — Patient Instructions (Signed)
[name] , Thank you for taking time to come for your Medicare Wellness Visit. I appreciate your ongoing commitment to your health goals. Please review the following plan we discussed and let me know if I can assist you in the future.   Screening recommendations/referrals: Colonoscopy: Done 03/29/2016 - Repeat in 10-15 years only if benefit outweighs risk Mammogram: Done 07/05/2020 - further testing ordered 07/2020 - please call and schedule this  Bone Density: Done 05/18/2020 - Repeat every 2 years Recommended yearly ophthalmology/optometry visit for glaucoma screening and checkup Recommended yearly dental visit for hygiene and checkup  Vaccinations: Influenza vaccine: Done 05/18/2020 - Appointment at [facility] 10/27 @ 8:30 - Repeat annually Pneumococcal vaccine: Done 03/14/2015 & 04/14/2018 Tdap vaccine: Done 05/16/2012 - Repeat in 10 years Shingles vaccine: Due   Covid-19: Done 09/14/2019, 11/08/2019, & 03/28/2020  Advanced directives: Advance directive discussed with you today. Even though you declined this today, please call our office should you change your mind, and we can give you the proper paperwork for you to fill out.   Conditions/risks identified: Aim for 30 minutes of exercise or brisk walking each day, drink 6-8 glasses of water and eat lots of fruits and vegetables.   Next appointment: Follow up in one year for your annual wellness visit    Preventive Care 65 Years and Older, Female Preventive care refers to lifestyle choices and visits with your health care provider that can promote health and wellness. What does preventive care include? A yearly physical exam. This is also called an annual well check. Dental exams once or twice a year. Routine eye exams. Ask your health care provider how often you should have your eyes checked. Personal lifestyle choices, including: Daily care of your teeth and gums. Regular physical activity. Eating a healthy diet. Avoiding tobacco and drug  use. Limiting alcohol use. Practicing safe sex. Taking low-dose aspirin every day. Taking vitamin and mineral supplements as recommended by your health care provider. What happens during an annual well check? The services and screenings done by your health care provider during your annual well check will depend on your age, overall health, lifestyle risk factors, and family history of disease. Counseling  Your health care provider may ask you questions about your: Alcohol use. Tobacco use. Drug use. Emotional well-being. Home and relationship well-being. Sexual activity. Eating habits. History of falls. Memory and ability to understand (cognition). Work and work Statistician. Reproductive health. Screening  You may have the following tests or measurements: Height, weight, and BMI. Blood pressure. Lipid and cholesterol levels. These may be checked every 5 years, or more frequently if you are over 40 years old. Skin check. Lung cancer screening. You may have this screening every year starting at age 46 if you have a 30-pack-year history of smoking and currently smoke or have quit within the past 15 years. Fecal occult blood test (FOBT) of the stool. You may have this test every year starting at age 35. Flexible sigmoidoscopy or colonoscopy. You may have a sigmoidoscopy every 5 years or a colonoscopy every 10 years starting at age 23. Hepatitis C blood test. Hepatitis B blood test. Sexually transmitted disease (STD) testing. Diabetes screening. This is done by checking your blood sugar (glucose) after you have not eaten for a while (fasting). You may have this done every 1-3 years. Bone density scan. This is done to screen for osteoporosis. You may have this done starting at age 35. Mammogram. This may be done every 1-2 years. Talk to  your health care provider about how often you should have regular mammograms. Talk with your health care provider about your test results, treatment  options, and if necessary, the need for more tests. Vaccines  Your health care provider may recommend certain vaccines, such as: Influenza vaccine. This is recommended every year. Tetanus, diphtheria, and acellular pertussis (Tdap, Td) vaccine. You may need a Td booster every 10 years. Zoster vaccine. You may need this after age 77. Pneumococcal 13-valent conjugate (PCV13) vaccine. One dose is recommended after age 9. Pneumococcal polysaccharide (PPSV23) vaccine. One dose is recommended after age 76. Talk to your health care provider about which screenings and vaccines you need and how often you need them. This information is not intended to replace advice given to you by your health care provider. Make sure you discuss any questions you have with your health care provider. Document Released: 07/21/2015 Document Revised: 03/13/2016 Document Reviewed: 04/25/2015 Elsevier Interactive Patient Education  2017 [place] Prevention in the Home Falls can cause injuries. They can happen to people of all ages. There are many things you can do to make your home safe and to help prevent falls. What can I do on the outside of my home? Regularly fix the edges of walkways and driveways and fix any cracks. Remove anything that might make you trip as you walk through a door, such as a raised step or threshold. Trim any bushes or trees on the path to your home. Use bright outdoor lighting. Clear any walking paths of anything that might make someone trip, such as rocks or tools. Regularly check to see if handrails are loose or broken. Make sure that both sides of any steps have handrails. Any raised decks and porches should have guardrails on the edges. Have any leaves, snow, or ice cleared regularly. Use sand or salt on walking paths during winter. Clean up any spills in your garage right away. This includes oil or grease spills. What can I do in the bathroom? Use night lights. Install grab  bars by the toilet and in the tub and shower. Do not use towel bars as grab bars. Use non-skid mats or decals in the tub or shower. If you need to sit down in the shower, use a plastic, non-slip stool. Keep the floor dry. Clean up any water that spills on the floor as soon as it happens. Remove soap buildup in the tub or shower regularly. Attach bath mats securely with double-sided non-slip rug tape. Do not have throw rugs and other things on the floor that can make you trip. What can I do in the bedroom? Use night lights. Make sure that you have a light by your bed that is easy to reach. Do not use any sheets or blankets that are too big for your bed. They should not hang down onto the floor. Have a firm chair that has side arms. You can use this for support while you get dressed. Do not have throw rugs and other things on the floor that can make you trip. What can I do in the kitchen? Clean up any spills right away. Avoid walking on wet floors. Keep items that you use a lot in easy-to-reach places. If you need to reach something above you, use a strong step stool that has a grab bar. Keep electrical cords out of the way. Do not use floor polish or wax that makes floors slippery. If you must use wax, use non-skid floor wax. Do not  have throw rugs and other things on the floor that can make you trip. What can I do with my stairs? Do not leave any items on the stairs. Make sure that there are handrails on both sides of the stairs and use them. Fix handrails that are broken or loose. Make sure that handrails are as long as the stairways. Check any carpeting to make sure that it is firmly attached to the stairs. Fix any carpet that is loose or worn. Avoid having throw rugs at the top or bottom of the stairs. If you do have throw rugs, attach them to the floor with carpet tape. Make sure that you have a light switch at the top of the stairs and the bottom of the stairs. If you do not have them,  ask someone to add them for you. What else can I do to help prevent falls? Wear shoes that: Do not have high heels. Have rubber bottoms. Are comfortable and fit you well. Are closed at the toe. Do not wear sandals. If you use a stepladder: Make sure that it is fully opened. Do not climb a closed stepladder. Make sure that both sides of the stepladder are locked into place. Ask someone to hold it for you, if possible. Clearly mark and make sure that you can see: Any grab bars or handrails. First and last steps. Where the edge of each step is. Use tools that help you move around (mobility aids) if they are needed. These include: Canes. Walkers. Scooters. Crutches. Turn on the lights when you go into a dark area. Replace any light bulbs as soon as they burn out. Set up your furniture so you have a clear path. Avoid moving your furniture around. If any of your floors are uneven, fix them. If there are any pets around you, be aware of where they are. Review your medicines with your doctor. Some medicines can make you feel dizzy. This can increase your chance of falling. Ask your doctor what other things that you can do to help prevent falls. This information is not intended to replace advice given to you by your health care provider. Make sure you discuss any questions you have with your health care provider. Document Released: 04/20/2009 Document Revised: 11/30/2015 Document Reviewed: 07/29/2014 Elsevier Interactive Patient Education  2017 [employer].

## 2021-05-03 ENCOUNTER — Ambulatory Visit (INDEPENDENT_AMBULATORY_CARE_PROVIDER_SITE_OTHER): Payer: Medicare Other

## 2021-05-03 ENCOUNTER — Other Ambulatory Visit: Payer: Self-pay

## 2021-05-03 DIAGNOSIS — Z23 Encounter for immunization: Secondary | ICD-10-CM | POA: Diagnosis not present

## 2021-05-05 DIAGNOSIS — E1165 Type 2 diabetes mellitus with hyperglycemia: Secondary | ICD-10-CM | POA: Diagnosis not present

## 2021-06-05 DIAGNOSIS — E1165 Type 2 diabetes mellitus with hyperglycemia: Secondary | ICD-10-CM | POA: Diagnosis not present

## 2021-06-13 ENCOUNTER — Other Ambulatory Visit: Payer: Self-pay | Admitting: *Deleted

## 2021-06-13 DIAGNOSIS — E119 Type 2 diabetes mellitus without complications: Secondary | ICD-10-CM

## 2021-06-13 MED ORDER — INSULIN NPH (HUMAN) (ISOPHANE) 100 UNIT/ML ~~LOC~~ SUSP: SUBCUTANEOUS | 0 refills | Status: DC

## 2021-06-14 ENCOUNTER — Telehealth: Payer: Self-pay | Admitting: Family Medicine

## 2021-06-14 DIAGNOSIS — Z794 Long term (current) use of insulin: Secondary | ICD-10-CM

## 2021-06-14 DIAGNOSIS — E119 Type 2 diabetes mellitus without complications: Secondary | ICD-10-CM

## 2021-06-14 MED ORDER — INSULIN NPH (HUMAN) (ISOPHANE) 100 UNIT/ML ~~LOC~~ SUSP: SUBCUTANEOUS | 0 refills | Status: DC

## 2021-06-14 NOTE — Telephone Encounter (Signed)
Called [facility] and left message to void rx on their end and call they patient when that has been done. I aslo called patient daughter and updated her on everything and she verbalized understanding .

## 2021-06-14 NOTE — Telephone Encounter (Signed)
Spoke with pharmacy and they states they stayed [place] filled it and they would have to void it on their end so [facility] can fill it

## 2021-06-14 NOTE — Telephone Encounter (Signed)
When called the number listed to call [phone]  they stated they did not know a [place]

## 2021-06-14 NOTE — Telephone Encounter (Signed)
[facility]

## 2021-06-14 NOTE — Telephone Encounter (Signed)
Daughter [facility] to [facility] to [facility] but was told cannot be filled till Feb.

## 2021-06-25 ENCOUNTER — Other Ambulatory Visit: Payer: Self-pay | Admitting: *Deleted

## 2021-06-25 ENCOUNTER — Other Ambulatory Visit: Payer: Self-pay | Admitting: Family Medicine

## 2021-06-25 DIAGNOSIS — M810 Age-related osteoporosis without current pathological fracture: Secondary | ICD-10-CM

## 2021-06-25 MED ORDER — ACCU-CHEK GUIDE W/DEVICE KIT: PACK | 0 refills | Status: AC

## 2021-06-25 MED ORDER — ACCU-CHEK GUIDE VI STRP: ORAL_STRIP | 3 refills | Status: AC

## 2021-06-25 NOTE — Telephone Encounter (Signed)
New meter sent too

## 2021-06-25 NOTE — Telephone Encounter (Signed)
Fax from [place] not covered, change to [facility]k guide Change made & sent to pharmacy

## 2021-07-06 ENCOUNTER — Telehealth (INDEPENDENT_AMBULATORY_CARE_PROVIDER_SITE_OTHER): Payer: Medicare Other | Admitting: Family

## 2021-07-06 ENCOUNTER — Encounter: Payer: Self-pay | Admitting: Family

## 2021-07-06 DIAGNOSIS — Z20828 Contact with and (suspected) exposure to other viral communicable diseases: Secondary | ICD-10-CM | POA: Diagnosis not present

## 2021-07-06 DIAGNOSIS — R6889 Other general symptoms and signs: Secondary | ICD-10-CM | POA: Diagnosis not present

## 2021-07-06 DIAGNOSIS — E1165 Type 2 diabetes mellitus with hyperglycemia: Secondary | ICD-10-CM | POA: Diagnosis not present

## 2021-07-06 MED ORDER — ONDANSETRON HCL 4 MG PO TABS: 4.0000 mg | ORAL_TABLET | Freq: Three times a day (TID) | ORAL | 0 refills | Status: DC | PRN

## 2021-07-06 MED ORDER — OSELTAMIVIR PHOSPHATE 75 MG PO CAPS: 75.0000 mg | ORAL_CAPSULE | Freq: Two times a day (BID) | ORAL | 0 refills | Status: DC

## 2021-07-06 NOTE — Progress Notes (Signed)
Virtual Visit  Note Due to COVID-19 pandemic this visit was conducted virtually. This visit type was conducted due to national recommendations for restrictions regarding the COVID-19 Pandemic (e.g. social distancing, sheltering in place) in an effort to limit this patient's exposure and mitigate transmission in our community. All issues noted in this document were discussed and addressed.  A physical exam was not performed with this format.  I connected with Susan Petty on 07/06/21 at 9:20 AM  by telephone and verified that I am speaking with the correct person using two identifiers. Susan Petty is currently located at home and no one is currently with her during visit. The provider, Evelina Dun, FNP is located in their office at time of visit.  I discussed the limitations, risks, security and privacy concerns of performing an evaluation and management service by telephone and the availability of in person appointments. I also discussed with the patient that there may be a patient responsible charge related to this service. The patient expressed understanding and agreed to proceed.  Ms. anaaya, fuster are scheduled for a virtual visit with your provider today.    Just as we do with appointments in the office, we must obtain your consent to participate.  Your consent will be active for this visit and any virtual visit you may have with one of our providers in the next 365 days.    If you have a MyChart account, I can also send a copy of this consent to you electronically.  All virtual visits are billed to your insurance company just like a traditional visit in the office.  As this is a virtual visit, video technology does not allow for your provider to perform a traditional examination.  This may limit your provider's ability to fully assess your condition.  If your provider identifies any concerns that need to be evaluated in person or the need to arrange testing such as labs, EKG, etc, we will  make arrangements to do so.    Although advances in technology are sophisticated, we cannot ensure that it will always work on either your end or our end.  If the connection with a video visit is poor, we may have to switch to a telephone visit.  With either a video or telephone visit, we are not always able to ensure that we have a secure connection.   I need to obtain your verbal consent now.   Are you willing to proceed with your visit today?   Susan Petty has provided verbal consent on 07/06/2021 for a virtual visit (video or telephone).   Evelina Dun, [place] 07/06/2021  9:26 AM    History and Present Illness:  Pt calls the office today with flu like symptoms that started two days ago. Her daughter was diagnosed with the flu yesterday. She had taken home COVID test that was negative. Cough This is a new problem. The current episode started in the past 7 days. The problem has been gradually worsening. The problem occurs every few minutes. The cough is Non-productive. Associated symptoms include ear congestion, ear pain, a fever, headaches, myalgias, nasal congestion, postnasal drip, rhinorrhea and shortness of breath. Pertinent negatives include no chills or wheezing. She has tried rest and OTC cough suppressant for the symptoms. The treatment provided mild relief.     Review of Systems  Constitutional:  Positive for fever. Negative for chills.  HENT:  Positive for ear pain, postnasal drip and rhinorrhea.   Respiratory:  Positive for  cough and shortness of breath. Negative for wheezing.   Musculoskeletal:  Positive for myalgias.  Neurological:  Positive for headaches.    Observations/Objective: No SOB or distress noted   Assessment and Plan: 1. Flu-like symptoms Rest, force fluids, tylenol as needed, report any worsening symptoms such as increased shortness of breath, swelling, or continued high fevers. Possible adverse effects discussed with antivirals.  Zofran as needed -  oseltamivir (TAMIFLU) 75 MG capsule; Take 1 capsule (75 mg total) by mouth 2 (two) times daily.  Dispense: 10 capsule; Refill: 0 - ondansetron (ZOFRAN) 4 MG tablet; Take 1 tablet (4 mg total) by mouth every 8 (eight) hours as needed for nausea or vomiting.  Dispense: 20 tablet; Refill: 0  2. Exposure to the flu     I discussed the assessment and treatment plan with the patient. The patient was provided an opportunity to ask questions and all were answered. The patient agreed with the plan and demonstrated an understanding of the instructions.   The patient was advised to call back or seek an in-person evaluation if the symptoms worsen or if the condition fails to improve as anticipated.  The above assessment and management plan was discussed with the patient. The patient verbalized understanding of and has agreed to the management plan. Patient is aware to call the clinic if symptoms persist or worsen. Patient is aware when to return to the clinic for a follow-up visit. Patient educated on when it is appropriate to go to the emergency department.   Time call ended:  9:32 AM   I provided 12 minutes of  non face-to-face time during this encounter.    Evelina Dun, FNP

## 2021-07-26 ENCOUNTER — Encounter: Payer: Self-pay | Admitting: Family Medicine

## 2021-07-26 ENCOUNTER — Ambulatory Visit (INDEPENDENT_AMBULATORY_CARE_PROVIDER_SITE_OTHER): Payer: Medicare Other | Admitting: Family Medicine

## 2021-07-26 ENCOUNTER — Telehealth: Payer: Self-pay | Admitting: *Deleted

## 2021-07-26 VITALS — BP 152/70 | HR 62 | Temp 98.9°F | Ht 64.0 in | Wt 147.0 lb

## 2021-07-26 DIAGNOSIS — Z794 Long term (current) use of insulin: Secondary | ICD-10-CM

## 2021-07-26 DIAGNOSIS — H6123 Impacted cerumen, bilateral: Secondary | ICD-10-CM | POA: Diagnosis not present

## 2021-07-26 DIAGNOSIS — E119 Type 2 diabetes mellitus without complications: Secondary | ICD-10-CM

## 2021-07-26 DIAGNOSIS — M1990 Unspecified osteoarthritis, unspecified site: Secondary | ICD-10-CM

## 2021-07-26 DIAGNOSIS — J069 Acute upper respiratory infection, unspecified: Secondary | ICD-10-CM | POA: Diagnosis not present

## 2021-07-26 LAB — BMP8+EGFR
BUN/Creatinine Ratio: 18 (ref 12–28)
BUN: 14 mg/dL (ref 8–27)
CO2: 26 mmol/L (ref 20–29)
Calcium: 9.8 mg/dL (ref 8.7–10.3)
Chloride: 101 mmol/L (ref 96–106)
Creatinine, Ser: 0.78 mg/dL (ref 0.57–1.00)
Glucose: 173 mg/dL — ABNORMAL HIGH (ref 70–99)
Potassium: 4.5 mmol/L (ref 3.5–5.2)
Sodium: 145 mmol/L — ABNORMAL HIGH (ref 134–144)
eGFR: 78 mL/min/{1.73_m2} (ref >59)

## 2021-07-26 LAB — CBC WITH DIFFERENTIAL/PLATELET
Basophils Absolute: 0.1 10*3/uL (ref 0.0–0.2)
Basos: 2 %
EOS (ABSOLUTE): 0.3 10*3/uL (ref 0.0–0.4)
Eos: 6 %
Hematocrit: 34.5 % (ref 34.0–46.6)
Hemoglobin: 11.5 g/dL (ref 11.1–15.9)
Immature Grans (Abs): 0 10*3/uL (ref 0.0–0.1)
Immature Granulocytes: 0 %
Lymphocytes Absolute: 2.2 10*3/uL (ref 0.7–3.1)
Lymphs: 41 %
MCH: 30.3 pg (ref 26.6–33.0)
MCHC: 33.3 g/dL (ref 31.5–35.7)
MCV: 91 fL (ref 79–97)
Monocytes Absolute: 0.4 10*3/uL (ref 0.1–0.9)
Monocytes: 8 %
Neutrophils Absolute: 2.4 10*3/uL (ref 1.4–7.0)
Neutrophils: 43 %
Platelets: 181 10*3/uL (ref 150–450)
RBC: 3.79 x10E6/uL (ref 3.77–5.28)
RDW: 12 % (ref 11.7–15.4)
WBC: 5.4 10*3/uL (ref 3.4–10.8)

## 2021-07-26 LAB — BAYER DCA HB A1C WAIVED: HB A1C (BAYER DCA - WAIVED): 7.4 % — ABNORMAL HIGH (ref 4.8–5.6)

## 2021-07-26 MED ORDER — DEBROX 6.5 % OT SOLN: 5.0000 [drp] | Freq: Two times a day (BID) | OTIC | 0 refills | Status: DC

## 2021-07-26 MED ORDER — FLUTICASONE PROPIONATE 50 MCG/ACT NA SUSP: 2.0000 | Freq: Every day | NASAL | 6 refills | Status: DC

## 2021-07-26 NOTE — Progress Notes (Addendum)
Subjective:  Patient ID: Susan Petty, female    DOB: 08-06-1945, 76 y.o.   MRN: [id number]  Patient Care Team: Janora Norlander, DO as PCP - General (Family Medicine) Danie Binder, MD (Inactive) as Consulting Physician (Gastroenterology) Lavera Guise, [facility] (Pharmacist) Eloise Harman, DO as Consulting Physician (Internal Medicine)   Chief Complaint:  GI Problem   HPI: Susan Petty is a 76 y.o. female presenting on 07/26/2021 for GI Problem   Pt presents for a follow up diabetes management. She states she had the flu at the end of the December.  She endorses symptoms of a sinus infection for the past 2 weeks including sinus pressure, left ear pain, and congestion. She has not tried OTC therapies. She also complains of intermittent nausea since having the flu relieved by zofran. She denies fever and chills.   GI Problem The primary symptoms include nausea and arthralgias. Primary symptoms do not include fever, fatigue, abdominal pain, vomiting, diarrhea or rash.  The illness does not include chills or constipation.     Relevant past medical, surgical, family, and social history reviewed and updated as indicated.  Allergies and medications reviewed and updated. Data reviewed: Chart in Epic.   Past Medical History:  Diagnosis Date   Diabetes mellitus    GERD (gastroesophageal reflux disease)    Hyperlipidemia    Hypertension    Osteoporosis     Past Surgical History:  Procedure Laterality Date   COLONOSCOPY N/A 03/29/2016   Dr. Oneida Alar: Redundant left colon, nonbleeding internal hemorrhoids.  Consider colonoscopy in 10 to 15 years if benefits outweigh the risk    ESOPHAGOGASTRODUODENOSCOPY N/A 03/29/2016   Dr. Oneida Alar: Small hiatal hernia, small sessile polyps found in the gastric fundus, benign fundic gland polyps, mild inflammation and erythema in the gastric antrum.  Duodenum was normal.  Biopsy negative for celiac.  Schatzki ring, patent.   TOTAL HIP  ARTHROPLASTY  09/03/2011   Procedure: TOTAL HIP ARTHROPLASTY;  Surgeon: Mauri Pole, MD;  Location: [facility];  Service: Orthopedics;  Laterality: Left;    Social History   Socioeconomic History   Marital status: Widowed    Spouse name: Not on file   Number of children: 2   Years of education: 12   Highest education level: High school graduate  Occupational History   Occupation: retired  Tobacco Use   Smoking status: Never   Smokeless tobacco: Never  Vaping Use   Vaping Use: Never used  Substance and Sexual Activity   Alcohol use: Yes    Comment: occasional wine   Drug use: No   Sexual activity: Not Currently    Birth control/protection: None  Other Topics Concern   Not on file  Social History Narrative   Lives alone. Daughters visit frequently   Social Determinants of Health   Financial Resource Strain: Low Risk    Difficulty of Paying Living Expenses: Not hard at all  Food Insecurity: No Food Insecurity   Worried About Charity fundraiser in the Last Year: Never true   Arboriculturist in the Last Year: Never true  Transportation Needs: No Transportation Needs   Lack of Transportation (Medical): No   Lack of Transportation (Non-Medical): No  Physical Activity: Insufficiently Active   Days of Exercise per Week: 4 days   Minutes of Exercise per Session: 30 min  Stress: No Stress Concern Present   Feeling of Stress : Only a little  Social Connections:  Moderately Integrated   Frequency of Communication with Friends and Family: More than three times a week   Frequency of Social Gatherings with Friends and Family: More than three times a week   Attends Religious Services: More than 4 times per year   Active Member of [employer] or Organizations: Yes   Attends Archivist Meetings: More than 4 times per year   Marital Status: Widowed  Intimate Partner Violence: Not At Risk   Fear of Current or Ex-Partner: No   Emotionally Abused: No   Physically Abused: No    Sexually Abused: No    Outpatient Encounter Medications as of 07/26/2021  Medication Sig   Accu-Chek Softclix Lancets lancets SMARTSIG:Topical 6 Times Daily   alendronate (FOSAMAX) 70 MG tablet TAKE 1 TABLET BY MOUTH ONCE A WEEK WITH  A  FULL  GLASS  OF  WATER  ON  AN  EMPTY  STOMACH  (NEEDS TO BE SEEN BEFORE NEXT REFILL)   amLODipine (NORVASC) 10 MG tablet TAKE 1 TABLET BY MOUTH ONCE DAILY STOP  HCTZ   aspirin EC 81 MG tablet Take 81 mg by mouth daily.   benazepril (LOTENSIN) 40 MG tablet Take 1 tablet by mouth once daily   blood glucose meter kit and supplies Dispense based on patient and insurance preference. Use up to four times daily as directed. (FOR ICD-10 E10.9, E11.9).   Blood Glucose Monitoring Suppl (ACCU-CHEK GUIDE) w/Device KIT CHECK GLUCOSE SIX TIMES DAILY Dx E11.9   cetirizine (ZYRTEC) 5 MG tablet Take 1 tablet (5 mg total) by mouth daily.   Cholecalciferol (VITAMIN D-3 PO) Take 2,000 Units by mouth daily.   Continuous Blood Gluc Sensor (FREESTYLE LIBRE 2 SENSOR) MISC by Does not apply route.   FLUoxetine (PROZAC) 10 MG capsule Take 1 capsule (10 mg total) by mouth daily.   fluticasone (FLONASE) 50 MCG/ACT nasal spray Place 2 sprays into both nostrils daily.   glucose blood (ACCU-CHEK GUIDE) test strip CHECK GLUCOSE SIX TIMES DAILY Dx E11.9   hydrochlorothiazide (MICROZIDE) 12.5 MG capsule Take 1 capsule (12.5 mg total) by mouth daily as needed. (NEEDS TO BE SEEN BEFORE NEXT REFILL)   insulin NPH Human (HUMULIN N) 100 UNIT/ML injection INJECT 30 UNITS SUBCUTANEOUSLY IN THE MORNING AND INJECT 5 UNITS IN THE EVENING   insulin regular (HUMULIN R) 100 units/mL injection Inject 0.03 mLs (3 Units total) into the skin 3 (three) times daily before meals. (NEEDS TO BE SEEN BEFORE NEXT REFILL)   LINZESS 290 MCG CAPS capsule TAKE 1 CAPSULE BY MOUTH ONCE DAILY BEFORE BREAKFAST   metFORMIN (GLUCOPHAGE) 500 MG tablet TAKE 2 TABLETS BY MOUTH TWICE DAILY WITH A MEAL   metoprolol tartrate  (LOPRESSOR) 25 MG tablet Take 1 tablet by mouth twice daily   nystatin cream (MYCOSTATIN) Apply 1 application topically 2 (two) times daily. X7-10 days   omeprazole (PRILOSEC) 40 MG capsule Take 1 capsule (40 mg total) by mouth 2 (two) times daily.   ondansetron (ZOFRAN) 4 MG tablet Take 1 tablet (4 mg total) by mouth every 8 (eight) hours as needed for nausea or vomiting.   oseltamivir (TAMIFLU) 75 MG capsule Take 1 capsule (75 mg total) by mouth 2 (two) times daily.   pravastatin (PRAVACHOL) 40 MG tablet Take 1 tablet by mouth once daily   psyllium (METAMUCIL) 58.6 % powder Take 1 packet by mouth daily.   valACYclovir (VALTREX) 1000 MG tablet Take 1 tablet (1,000 mg total) by mouth 2 (two) times daily.   carbamide  peroxide (DEBROX) 6.5 % OTIC solution Place 5 drops into both ears 2 (two) times daily.   fluticasone (FLONASE) 50 MCG/ACT nasal spray Place 2 sprays into both nostrils daily.   No facility-administered encounter medications on file as of 07/26/2021.    Allergies  Allergen Reactions   Sulfa Antibiotics    Sulfa Drugs Cross Reactors Rash    Review of Systems  Constitutional:  Negative for activity change, appetite change, chills, diaphoresis, fatigue, fever and unexpected weight change.  HENT:  Positive for congestion, ear pain and rhinorrhea. Negative for dental problem, drooling, ear discharge, facial swelling, hearing loss, mouth sores, nosebleeds, postnasal drip, sinus pressure, sinus pain, sneezing, sore throat, tinnitus, trouble swallowing and voice change.   Eyes:  Negative for discharge.  Respiratory:  Positive for cough. Negative for shortness of breath and wheezing.   Cardiovascular:  Negative for chest pain, palpitations and leg swelling.  Gastrointestinal:  Positive for nausea. Negative for abdominal pain, constipation, diarrhea and vomiting.  Endocrine: Negative for polydipsia.  Genitourinary:  Negative for decreased urine volume and difficulty urinating.   Musculoskeletal:  Positive for arthralgias and joint swelling (R knee).  Skin:  Negative for rash.  Neurological:  Negative for weakness and numbness.  Psychiatric/Behavioral:  Negative for confusion.   All other systems reviewed and are negative.      Objective:  BP (!) 152/70    Pulse 62    Temp 98.9 F (37.2 C)    Ht _0  (1.626 m)    Wt 66.7 kg    SpO2 99%    BMI 25.23 kg/m    Wt Readings from Last 3 Encounters:  07/26/21 66.7 kg  04/25/21 64.9 kg  03/06/21 64.7 kg    Physical Exam Vitals and nursing note reviewed.  Constitutional:      General: She is not in acute distress.    Appearance: Normal appearance. She is not ill-appearing, toxic-appearing or diaphoretic.  HENT:     Head: Normocephalic.     Comments: Bilateral cerumen impaction      Right Ear: There is impacted cerumen.     Left Ear: There is impacted cerumen.     Nose: Nose normal.     Mouth/Throat:     Mouth: Mucous membranes are moist.     Pharynx: Oropharynx is clear.  Eyes:     Pupils: Pupils are equal, round, and reactive to light.  Cardiovascular:     Rate and Rhythm: Normal rate and regular rhythm.     Pulses: Normal pulses.     Heart sounds: Normal heart sounds.  Pulmonary:     Effort: Pulmonary effort is normal.     Breath sounds: Normal breath sounds.  Abdominal:     General: Bowel sounds are normal. There is no distension.     Palpations: Abdomen is soft.     Tenderness: There is no abdominal tenderness.  Musculoskeletal:        General: Normal range of motion.  Skin:    General: Skin is warm and dry.     Capillary Refill: Capillary refill takes less than 2 seconds.  Neurological:     General: No focal deficit present.     Mental Status: She is alert and oriented to person, place, and time.  Psychiatric:        Mood and Affect: Mood normal.        Behavior: Behavior normal.        Thought Content: Thought content normal.  Judgment: Judgment normal.    Results for orders  placed or performed in visit on 03/06/21  CMP14+EGFR  Result Value Ref Range   Glucose 152 (H) 65 - 99 mg/dL   BUN 13 8 - 27 mg/dL   Creatinine, Ser 0.91 0.57 - 1.00 mg/dL   eGFR 65 >59 mL/min/1.73   BUN/Creatinine Ratio 14 12 - 28   Sodium 140 134 - 144 mmol/L   Potassium 4.5 3.5 - 5.2 mmol/L   Chloride 99 96 - 106 mmol/L   CO2 27 20 - 29 mmol/L   Calcium 9.4 8.7 - 10.3 mg/dL   Total Protein 6.9 6.0 - 8.5 g/dL   Albumin 4.5 3.7 - 4.7 g/dL   Globulin, Total 2.4 1.5 - 4.5 g/dL   Albumin/Globulin Ratio 1.9 1.2 - 2.2   Bilirubin Total 0.4 0.0 - 1.2 mg/dL   Alkaline Phosphatase 76 44 - 121 IU/L   AST 21 0 - 40 IU/L   ALT 17 0 - 32 IU/L  Lipid Panel  Result Value Ref Range   Cholesterol, Total 171 100 - 199 mg/dL   Triglycerides 52 0 - 149 mg/dL   HDL 94 >39 mg/dL   VLDL Cholesterol Cal 11 5 - 40 mg/dL   LDL Chol Calc (NIH) 66 0 - 99 mg/dL   Chol/HDL Ratio 1.8 0.0 - 4.4 ratio  Bayer DCA Hb A1c Waived  Result Value Ref Range   HB A1C (BAYER DCA - WAIVED) 7.8 (H) <7.0 %       Pertinent labs & imaging results that were available during my care of the patient were reviewed by me and considered in my medical decision making.  Assessment & Plan:  Susan Petty was seen today for gi problem.  Diagnoses and all orders for this visit:  Type 2 diabetes mellitus with insulin therapy ([place]) -     Bayer DCA Hb A1c Waived -     BMP8+EGFR -     CBC with Differential/Platelet -     A1C 7.4 in office today, no changes to medications made today.   Arthritis       -     Apply topical Votaren gel to the right knee.   Bilateral impacted cerumen -     carbamide peroxide (DEBROX) 6.5 % OTIC solution;      Place 5 drops into both ears 2 (two) times daily.  Viral upper respiratory tract infection       -     No signs of bacterial infection at this time.        -     fluticasone (FLONASE) 50 MCG/ACT nasal spray;      Place 2 sprays into both nostrils daily.            Continue all other  maintenance medications.  Follow up plan: Return in about 3 months (around 10/24/2021) for with PCP for DM.   Continue healthy lifestyle choices, including diet (rich in fruits, vegetables, and lean proteins, and low in salt and simple carbohydrates) and exercise (at least 30 minutes of moderate physical activity daily).  Educational handout given for diabetes.   The above assessment and management plan was discussed with the patient. The patient verbalized understanding of and has agreed to the management plan. Patient is aware to call the clinic if they develop any new symptoms or if symptoms persist or worsen. Patient is aware when to return to the clinic for a follow-up visit. Patient educated on when it is  appropriate to go to the emergency department.   Addison Virgel Haro, RN, NP Student  I personally was present during the history, physical exam, and medical decision-making activities of this visit and have verified that the services and findings are accurately documented in the nurse practitioner student's note.  Monia Pouch, FNP-C [facility] [address] [place], [place] [address] [phone]

## 2021-08-02 ENCOUNTER — Other Ambulatory Visit: Payer: Self-pay | Admitting: Family Medicine

## 2021-08-06 DIAGNOSIS — E1165 Type 2 diabetes mellitus with hyperglycemia: Secondary | ICD-10-CM | POA: Diagnosis not present

## 2021-08-09 ENCOUNTER — Other Ambulatory Visit: Payer: Self-pay | Admitting: Family Medicine

## 2021-08-09 DIAGNOSIS — B001 Herpesviral vesicular dermatitis: Secondary | ICD-10-CM

## 2021-08-14 ENCOUNTER — Ambulatory Visit: Payer: Medicare Other | Admitting: Family

## 2021-08-15 DIAGNOSIS — X32XXXD Exposure to sunlight, subsequent encounter: Secondary | ICD-10-CM | POA: Diagnosis not present

## 2021-08-15 DIAGNOSIS — B078 Other viral warts: Secondary | ICD-10-CM | POA: Diagnosis not present

## 2021-08-15 DIAGNOSIS — D225 Melanocytic nevi of trunk: Secondary | ICD-10-CM | POA: Diagnosis not present

## 2021-08-15 DIAGNOSIS — L570 Actinic keratosis: Secondary | ICD-10-CM | POA: Diagnosis not present

## 2021-09-06 DIAGNOSIS — E1165 Type 2 diabetes mellitus with hyperglycemia: Secondary | ICD-10-CM | POA: Diagnosis not present

## 2021-09-10 ENCOUNTER — Other Ambulatory Visit: Payer: Self-pay | Admitting: Family Medicine

## 2021-09-10 DIAGNOSIS — Z794 Long term (current) use of insulin: Secondary | ICD-10-CM

## 2021-09-10 DIAGNOSIS — E119 Type 2 diabetes mellitus without complications: Secondary | ICD-10-CM

## 2021-09-13 ENCOUNTER — Ambulatory Visit (INDEPENDENT_AMBULATORY_CARE_PROVIDER_SITE_OTHER): Payer: Medicare Other | Admitting: Family

## 2021-09-13 ENCOUNTER — Encounter: Payer: Self-pay | Admitting: Family

## 2021-09-13 VITALS — BP 142/51 | HR 61 | Temp 98.3°F | Ht 64.0 in | Wt 149.6 lb

## 2021-09-13 DIAGNOSIS — H6122 Impacted cerumen, left ear: Secondary | ICD-10-CM

## 2021-09-13 DIAGNOSIS — R350 Frequency of micturition: Secondary | ICD-10-CM

## 2021-09-13 DIAGNOSIS — J019 Acute sinusitis, unspecified: Secondary | ICD-10-CM

## 2021-09-13 LAB — URINALYSIS, COMPLETE
Bilirubin, UA: NEGATIVE
Ketones, UA: NEGATIVE
Leukocytes,UA: NEGATIVE
Nitrite, UA: NEGATIVE
Protein,UA: NEGATIVE
RBC, UA: NEGATIVE
Specific Gravity, UA: 1.01 (ref 1.005–1.030)
Urobilinogen, Ur: 0.2 mg/dL (ref 0.2–1.0)
pH, UA: 7 (ref 5.0–7.5)

## 2021-09-13 LAB — MICROSCOPIC EXAMINATION
RBC, Urine: NONE SEEN /hpf (ref 0–2)
Renal Epithel, UA: NONE SEEN /hpf

## 2021-09-13 MED ORDER — AMOXICILLIN-POT CLAVULANATE 875-125 MG PO TABS: 1.0000 | ORAL_TABLET | Freq: Two times a day (BID) | ORAL | 0 refills | Status: DC

## 2021-09-13 NOTE — Progress Notes (Signed)
? ?Subjective:  ? ? Patient ID: Susan Petty, female    DOB: 11-Oct-1945, 76 y.o.   MRN: [id number] ? ?Chief Complaint  ?Patient presents with  ? Ear Pain  ? Sinusitis  ? Urinary Frequency  ? ? ?Sinusitis ?This is a new problem. The current episode started more than 1 month ago. The problem has been gradually worsening since onset. Associated symptoms include congestion, coughing (at night), ear pain, headaches, sinus pressure and sneezing. Pertinent negatives include no neck pain or sore throat. Past treatments include oral decongestants. The treatment provided mild relief.  ?Urinary Frequency  ?This is a new problem. The current episode started 1 to 4 weeks ago. The problem occurs intermittently. The patient is experiencing no pain. Associated symptoms include frequency. Pertinent negatives include no hematuria, hesitancy, urgency or vomiting.  ?Ear Fullness  ?There is pain in the left ear. The current episode started 1 to 4 weeks ago. The problem has been waxing and waning. Associated symptoms include coughing (at night), headaches and hearing loss. Pertinent negatives include no neck pain, sore throat or vomiting. She has tried ear drops for the symptoms. The treatment provided mild relief.  ? ? ? ?Review of Systems  ?HENT:  Positive for congestion, ear pain, hearing loss, sinus pressure and sneezing. Negative for sore throat.   ?Respiratory:  Positive for cough (at night).   ?Gastrointestinal:  Negative for vomiting.  ?Genitourinary:  Positive for frequency. Negative for hematuria, hesitancy and urgency.  ?Musculoskeletal:  Negative for neck pain.  ?Neurological:  Positive for headaches.  ?All other systems reviewed and are negative. ? ?   ?Objective:  ? Physical Exam ?Vitals reviewed.  ?Constitutional:   ?   General: She is not in acute distress. ?   Appearance: She is well-developed.  ?HENT:  ?   Head: Normocephalic and atraumatic.  ?   Right Ear: Tympanic membrane normal.  ?   Left Ear: There is impacted  cerumen.  ?   Nose:  ?   Right Sinus: Maxillary sinus tenderness present.  ?   Left Sinus: Maxillary sinus tenderness present.  ?Eyes:  ?   Pupils: Pupils are equal, round, and reactive to light.  ?Neck:  ?   Thyroid: No thyromegaly.  ?Cardiovascular:  ?   Rate and Rhythm: Normal rate and regular rhythm.  ?   Heart sounds: Normal heart sounds. No murmur heard. ?Pulmonary:  ?   Effort: Pulmonary effort is normal. No respiratory distress.  ?   Breath sounds: Normal breath sounds. No wheezing.  ?Abdominal:  ?   General: Bowel sounds are normal. There is no distension.  ?   Palpations: Abdomen is soft.  ?   Tenderness: There is no abdominal tenderness.  ?Musculoskeletal:     ?   General: No tenderness. Normal range of motion.  ?   Cervical back: Normal range of motion and neck supple.  ?Skin: ?   General: Skin is warm and dry.  ?Neurological:  ?   Mental Status: She is alert and oriented to person, place, and time.  ?   Cranial Nerves: No cranial nerve deficit.  ?   Deep Tendon Reflexes: Reflexes are normal and symmetric.  ?Psychiatric:     ?   Behavior: Behavior normal.     ?   Thought Content: Thought content normal.     ?   Judgment: Judgment normal.  ? ?Left ear wash with warm water and peroxide. Pt tolerated well. TM WNL ? ? ?BP Marland Kitchen)  142/51   Pulse 61   Temp 98.3 ?F (36.8 ?C) (Temporal)   Ht '5\' 4"'$  (1.626 m)   Wt 149 lb 9.6 oz (67.9 kg)   BMI 25.68 kg/m?  ? ?   ?Assessment & Plan:  ?Susan Petty comes in today with chief complaint of Ear Pain, Sinusitis, and Urinary Frequency ? ? ?Diagnosis and orders addressed: ? ?1. Impacted cerumen of left ear ?Improved ? ?2. Acute sinusitis, recurrence not specified, unspecified location ?- Take meds as prescribed ?- Use a cool mist humidifier  ?-Use saline nose sprays frequently ?-Force fluids ?-For any cough or congestion ? Use plain Mucinex- regular strength or max strength is fine ?-For fever or aces or pains- take tylenol or ibuprofen. ?-Throat lozenges if  help ?-Follow up if symptoms worsen or do not improve  ?- amoxicillin-clavulanate (AUGMENTIN) 875-125 MG tablet; Take 1 tablet by mouth 2 (two) times daily.  Dispense: 14 tablet; Refill: 0 ? ?3. Urinary frequency ?- Urinalysis, Complete ? ? ?Health Maintenance reviewed ?Diet and exercise encouraged ? ?Follow up plan: ?As needed and keep follow up with PCP ? ? ?Susan Dun, FNP ? ? ? ?

## 2021-09-13 NOTE — Patient Instructions (Signed)

## 2021-09-20 ENCOUNTER — Telehealth: Payer: Self-pay

## 2021-09-20 NOTE — Telephone Encounter (Signed)
Patient is past due for mammogram - patient was to have additional imaging follow screening mammogram done on 07/05/2020, but she did not have done. Patient will need bilat diagnostic mammogram with possible ultrasounds before yearly screening can be resumed. Called patient to notify and find out which location she prefers and I will send orders. Patient declined appointment at this time and states that she will call back when ready to set up appointment.  ?

## 2021-09-21 ENCOUNTER — Other Ambulatory Visit: Payer: Self-pay | Admitting: Family Medicine

## 2021-09-21 DIAGNOSIS — J019 Acute sinusitis, unspecified: Secondary | ICD-10-CM

## 2021-09-27 ENCOUNTER — Other Ambulatory Visit: Payer: Self-pay | Admitting: Family Medicine

## 2021-09-27 DIAGNOSIS — E1159 Type 2 diabetes mellitus with other circulatory complications: Secondary | ICD-10-CM

## 2021-09-27 DIAGNOSIS — M810 Age-related osteoporosis without current pathological fracture: Secondary | ICD-10-CM

## 2021-09-27 DIAGNOSIS — E1169 Type 2 diabetes mellitus with other specified complication: Secondary | ICD-10-CM

## 2021-09-27 DIAGNOSIS — E1165 Type 2 diabetes mellitus with hyperglycemia: Secondary | ICD-10-CM

## 2021-10-08 DIAGNOSIS — E1165 Type 2 diabetes mellitus with hyperglycemia: Secondary | ICD-10-CM | POA: Diagnosis not present

## 2021-11-07 ENCOUNTER — Encounter: Payer: Self-pay | Admitting: Family Medicine

## 2021-11-07 ENCOUNTER — Ambulatory Visit (INDEPENDENT_AMBULATORY_CARE_PROVIDER_SITE_OTHER): Payer: Medicare Other | Admitting: Family Medicine

## 2021-11-07 VITALS — BP 167/74 | HR 67 | Temp 97.6°F | Ht 64.0 in | Wt 148.4 lb

## 2021-11-07 DIAGNOSIS — E119 Type 2 diabetes mellitus without complications: Secondary | ICD-10-CM | POA: Diagnosis not present

## 2021-11-07 DIAGNOSIS — Z794 Long term (current) use of insulin: Secondary | ICD-10-CM

## 2021-11-07 MED ORDER — NOVOFINE PLUS PEN NEEDLE 32G X 4 MM MISC: 3 refills | Status: DC

## 2021-11-07 MED ORDER — TRESIBA FLEXTOUCH 100 UNIT/ML ~~LOC~~ SOPN: 20.0000 [IU] | PEN_INJECTOR | Freq: Every day | SUBCUTANEOUS | 3 refills | Status: DC

## 2021-11-07 NOTE — Progress Notes (Signed)
? ?Subjective: ?CC: Hypoglycemia ?PCP: Janora Norlander, DO ?SAY:TKZSWF Susan Petty is a 76 y.o. female presenting to clinic today for: ? ?1.  Labile blood sugars ?Patient reports that she is been experiencing big fluctuations in her blood sugars.  Sometimes they are high and she will give herself the regular Novolin, typically 3 units.  Other times she has low blood sugars in the 40s and 50s and this seems to becoming more frequent.  Currently injecting 25 units of Novolin in in the morning and 5 units of Novolin and in the evening time.  She notes that often her freestyle [name] will alarm in the middle of the night and she has to wake up and drink or eat something to raise her blood sugar.  She finds it extremely difficult to get blood sugars up sometimes.  She has not had any syncopal episodes reported.  She has been very reluctant to transition over to any other therapies because she has been on this regimen for many decades.  Her daughter assists with insulin ? ? ?ROS: Per HPI ? ?Allergies  ?Allergen Reactions  ? Sulfa Antibiotics   ? Sulfa Drugs Cross Reactors Rash  ? ?Past Medical History:  ?Diagnosis Date  ? Diabetes mellitus   ? GERD (gastroesophageal reflux disease)   ? Hyperlipidemia   ? Hypertension   ? Osteoporosis   ? ? ?Current Outpatient Medications:  ?  Accu-Chek Softclix Lancets lancets, SMARTSIG:Topical 6 Times Daily, Disp: , Rfl:  ?  alendronate (FOSAMAX) 70 MG tablet, TAKE 1 TABLET BY MOUTH ONCE A WEEK WITH  A  FULL  GLASS  OF  WATER  ON  AN  EMPTY  STOMACH, Disp: 4 tablet, Rfl: 0 ?  amLODipine (NORVASC) 10 MG tablet, Take 1 tablet (10 mg total) by mouth daily. (NEEDS TO BE SEEN BEFORE NEXT REFILL), Disp: 30 tablet, Rfl: 0 ?  aspirin EC 81 MG tablet, Take 81 mg by mouth daily., Disp: , Rfl:  ?  benazepril (LOTENSIN) 40 MG tablet, Take 1 tablet (40 mg total) by mouth daily. (NEEDS TO BE SEEN BEFORE NEXT REFILL), Disp: 30 tablet, Rfl: 0 ?  blood glucose meter kit and supplies, Dispense based on  patient and insurance preference. Use up to four times daily as directed. (FOR ICD-10 E10.9, E11.9)., Disp: 1 each, Rfl: 0 ?  Blood Glucose Monitoring Suppl (ACCU-CHEK GUIDE) w/Device KIT, CHECK GLUCOSE SIX TIMES DAILY Dx E11.9, Disp: 1 kit, Rfl: 0 ?  carbamide peroxide (DEBROX) 6.5 % OTIC solution, Place 5 drops into both ears 2 (two) times daily., Disp: 15 mL, Rfl: 0 ?  cetirizine (ZYRTEC) 5 MG tablet, Take 1 tablet by mouth once daily, Disp: 30 tablet, Rfl: 0 ?  Cholecalciferol (VITAMIN D-3 PO), Take 2,000 Units by mouth daily., Disp: , Rfl:  ?  Continuous Blood Gluc Sensor (FREESTYLE LIBRE 2 SENSOR) MISC, by Does not apply route., Disp: , Rfl:  ?  FLUoxetine (PROZAC) 10 MG capsule, Take 1 capsule (10 mg total) by mouth daily., Disp: 90 capsule, Rfl: 3 ?  fluticasone (FLONASE) 50 MCG/ACT nasal spray, Place 2 sprays into both nostrils daily., Disp: 16 g, Rfl: 6 ?  glucose blood (ACCU-CHEK GUIDE) test strip, CHECK GLUCOSE SIX TIMES DAILY Dx E11.9, Disp: 600 each, Rfl: 3 ?  hydrochlorothiazide (MICROZIDE) 12.5 MG capsule, Take 1 capsule (12.5 mg total) by mouth daily. (NEEDS TO BE SEEN BEFORE NEXT REFILL), Disp: 30 capsule, Rfl: 0 ?  insulin NPH Human (HUMULIN N) 100 UNIT/ML injection,  INJECT 30 UNITS SUBCUTANEOUSLY IN THE MORNING AND INJECT 5 UNITS IN THE EVENING, Disp: 30 mL, Rfl: 1 ?  insulin regular (HUMULIN R) 100 units/mL injection, Inject 0.03 mLs (3 Units total) into the skin 3 (three) times daily before meals. (NEEDS TO BE SEEN BEFORE NEXT REFILL), Disp: 3 mL, Rfl: 0 ?  LINZESS 290 MCG CAPS capsule, TAKE 1 CAPSULE BY MOUTH ONCE DAILY BEFORE BREAKFAST, Disp: 30 capsule, Rfl: 11 ?  metFORMIN (GLUCOPHAGE) 500 MG tablet, Take 2 tablets (1,000 mg total) by mouth 2 (two) times daily with a meal. (NEEDS TO BE SEEN BEFORE NEXT REFILL), Disp: 120 tablet, Rfl: 0 ?  metoprolol tartrate (LOPRESSOR) 25 MG tablet, Take 1 tablet (25 mg total) by mouth 2 (two) times daily. (NEEDS TO BE SEEN BEFORE NEXT REFILL), Disp: 60  tablet, Rfl: 0 ?  nystatin cream (MYCOSTATIN), Apply 1 application topically 2 (two) times daily. X7-10 days, Disp: 30 g, Rfl: 0 ?  omeprazole (PRILOSEC) 40 MG capsule, Take 1 capsule (40 mg total) by mouth 2 (two) times daily., Disp: 180 capsule, Rfl: 3 ?  ondansetron (ZOFRAN) 4 MG tablet, Take 1 tablet (4 mg total) by mouth every 8 (eight) hours as needed for nausea or vomiting., Disp: 20 tablet, Rfl: 0 ?  pravastatin (PRAVACHOL) 40 MG tablet, Take 1 tablet (40 mg total) by mouth daily. (NEEDS TO BE SEEN BEFORE NEXT REFILL), Disp: 30 tablet, Rfl: 0 ?  psyllium (METAMUCIL) 58.6 % powder, Take 1 packet by mouth daily., Disp: , Rfl:  ?  valACYclovir (VALTREX) 1000 MG tablet, Take 1 tablet by mouth twice daily, Disp: 20 tablet, Rfl: 0 ?Social History  ? ?Socioeconomic History  ? Marital status: Widowed  ?  Spouse name: Not on file  ? Number of children: 2  ? Years of education: 46  ? Highest education level: High school graduate  ?Occupational History  ? Occupation: retired  ?Tobacco Use  ? Smoking status: Never  ? Smokeless tobacco: Never  ?Vaping Use  ? Vaping Use: Never used  ?Substance and Sexual Activity  ? Alcohol use: Yes  ?  Comment: occasional wine  ? Drug use: No  ? Sexual activity: Not Currently  ?  Birth control/protection: None  ?Other Topics Concern  ? Not on file  ?Social History Narrative  ? Lives alone. Daughters visit frequently  ? ?Social Determinants of Health  ? ?Financial Resource Strain: Low Risk   ? Difficulty of Paying Living Expenses: Not hard at all  ?Food Insecurity: No Food Insecurity  ? Worried About Charity fundraiser in the Last Year: Never true  ? Ran Out of Food in the Last Year: Never true  ?Transportation Needs: No Transportation Needs  ? Lack of Transportation (Medical): No  ? Lack of Transportation (Non-Medical): No  ?Physical Activity: Insufficiently Active  ? Days of Exercise per Week: 4 days  ? Minutes of Exercise per Session: 30 min  ?Stress: No Stress Concern Present  ?  Feeling of Stress : Only a little  ?Social Connections: Moderately Integrated  ? Frequency of Communication with Friends and Family: More than three times a week  ? Frequency of Social Gatherings with Friends and Family: More than three times a week  ? Attends Religious Services: More than 4 times per year  ? Active Member of Clubs or Organizations: Yes  ? Attends Archivist Meetings: More than 4 times per year  ? Marital Status: Widowed  ?Intimate Partner Violence: Not At Risk  ?  Fear of Current or Ex-Partner: No  ? Emotionally Abused: No  ? Physically Abused: No  ? Sexually Abused: No  ? ?Family History  ?Problem Relation Age of Onset  ? Hypertension Mother   ? Hypertension Father   ? Diabetes Father   ? Hypertension Sister   ? Cancer Sister   ? Diabetes Brother   ? Diabetes Brother   ? Colon cancer Neg Hx   ? ? ?Objective: ?Office vital signs reviewed. ?BP (!) 167/74   Pulse 67   Temp 97.6 ?F (36.4 ?C)   Ht '5\' 4"'  (1.626 m)   Wt 148 lb 6.4 oz (67.3 kg)   SpO2 95%   BMI 25.47 kg/m?  ? ?Physical Examination:  ?General: Awake, alert, thin, elderly female, No acute distress ?HEENT: Sclera white. ?Cardio: regular rate and rhythm  ? ?Assessment/ Plan: ?76 y.o. female  ? ?Type 2 diabetes mellitus with insulin therapy ([place]) - Plan: Bayer DCA Hb A1c Waived, insulin degludec (TRESIBA FLEXTOUCH) 100 UNIT/ML FlexTouch Pen, Insulin Pen Needle (NOVOFINE PLUS PEN NEEDLE) 32G X 4 MM MISC ? ?She is agreeable to transitioning over to a more reliable long-acting insulin.  Our clinical pharmacist, Almyra Free, spent quite a bit of time with the patient teaching her how to utilize this pen.  We are going to start her at a slow therapeutic insulin dose of 20 units even though her total daily dosing with the other insulin is roughly 30+ units.  I worry about her developing hypoglycemia but I am hoping that this longer acting molecule will give her more stability than what she is experiencing.  She will follow-up in the next 2  weeks with Almyra Free for sugar review, sooner if any concerns arise.  Reiterated that she is not to use the rapid insulin unless instructed by either myself or Almyra Free to do so. ? ?No orders of the defined types were pl

## 2021-11-07 NOTE — Patient Instructions (Signed)
STOP Novolin ?START tresiba ?Follow up with Almyra Free in 2 weeks for sugar review. ?

## 2021-11-08 ENCOUNTER — Telehealth: Payer: Self-pay | Admitting: Family Medicine

## 2021-11-08 ENCOUNTER — Other Ambulatory Visit: Payer: Self-pay | Admitting: *Deleted

## 2021-11-08 DIAGNOSIS — E1159 Type 2 diabetes mellitus with other circulatory complications: Secondary | ICD-10-CM

## 2021-11-08 DIAGNOSIS — E1165 Type 2 diabetes mellitus with hyperglycemia: Secondary | ICD-10-CM | POA: Diagnosis not present

## 2021-11-08 DIAGNOSIS — E785 Hyperlipidemia, unspecified: Secondary | ICD-10-CM

## 2021-11-08 MED ORDER — BENAZEPRIL HCL 40 MG PO TABS: 40.0000 mg | ORAL_TABLET | Freq: Every day | ORAL | 3 refills | Status: DC

## 2021-11-08 MED ORDER — PRAVASTATIN SODIUM 40 MG PO TABS: 40.0000 mg | ORAL_TABLET | Freq: Every day | ORAL | 3 refills | Status: DC

## 2021-11-08 NOTE — Telephone Encounter (Signed)
Called back and answered questions

## 2021-11-08 NOTE — Telephone Encounter (Signed)
Pt called requesting to speak with Almyra Free. Has questions about insulin she received yesterday. ?

## 2021-11-23 ENCOUNTER — Ambulatory Visit (INDEPENDENT_AMBULATORY_CARE_PROVIDER_SITE_OTHER): Payer: Medicare Other | Admitting: Pharmacist

## 2021-11-23 DIAGNOSIS — E1159 Type 2 diabetes mellitus with other circulatory complications: Secondary | ICD-10-CM

## 2021-11-23 DIAGNOSIS — E119 Type 2 diabetes mellitus without complications: Secondary | ICD-10-CM

## 2021-11-26 ENCOUNTER — Other Ambulatory Visit: Payer: Self-pay | Admitting: Family Medicine

## 2021-11-26 DIAGNOSIS — E1159 Type 2 diabetes mellitus with other circulatory complications: Secondary | ICD-10-CM

## 2021-12-05 DIAGNOSIS — Z794 Long term (current) use of insulin: Secondary | ICD-10-CM

## 2021-12-05 DIAGNOSIS — E1159 Type 2 diabetes mellitus with other circulatory complications: Secondary | ICD-10-CM

## 2021-12-05 DIAGNOSIS — I152 Hypertension secondary to endocrine disorders: Secondary | ICD-10-CM

## 2021-12-05 NOTE — Patient Instructions (Addendum)
Visit Information  Following are the goals we discussed today:  Current Barriers:  Unable to independently monitor therapeutic efficacy Unable to maintain control of T2DM  Pharmacist Clinical Goal(s):  Over the next 90 days, patient will achieve adherence to monitoring guidelines and medication adherence to achieve therapeutic efficacy achieve control of T2DM as evidenced by GOAL A1C<7%, IMPROVED GLYCEMIC CONTROL, AVOID HYPOGLYCEMIA through collaboration with PharmD and provider.    Interventions: 1:1 collaboration with Janora Norlander, DO regarding development and update of comprehensive plan of care as evidenced by provider attestation and co-signature Inter-disciplinary care team collaboration (see longitudinal plan of care) Comprehensive medication review performed; medication list updated in electronic medical record  Diabetes: Uncontrolled-A1C 7.4% with recurrent hypglycemia/falls; current treatment: HUMULIN N 30 UNITS IN AM, 5 UNITS IN PM; NOVOLIN R 3 UNITS 3X DAILY WITH MEALS, METFORMIN Patient hesitant to switch to basal monotherapy with tresiba, but having HYPOGLYCEMIA We switched her to basal (tresiba) 20 units daily Discontinues humlin R/N due to hypoglycemia Continue metformin Patient has not switched yet to our recommended plan She states she will do so over the weekend Will f/u with patient next week Patient wearing Libre 2 CGM; education provided; sample has been provided due to sensor malfunctions Having CGM is making patient more aware of what she is eating and how she is taking insulin Gfr 56, A1c 8.2% increasing, but HYPOGLYCEMIA Current glucose readings: fasting glucose: <150, post prandial glucose: varies Reports hypoglycemic/hyperglycemic symptoms (counseled on hypo/hyper) Discussed meal planning options and Plate method for healthy eating Avoid sugary drinks and desserts Incorporate balanced protein, non starchy veggies, 1 serving of carbohydrate with each  meal Increase water intake Increase physical activity as able Current exercise: n/a-does not walk like she used to due to age Educated on [employer] CGM 2 --gets via mailed to her home from [facility] (parachute portal)  Patient Goals/Self-Care Activities Over the next 90 days, patient will:  - take medications as prescribed check glucose daily, document, and provide at future appointments  Follow Up Plan: Telephone follow up appointment with care management team member scheduled for: Face to Face appointment with care management team member scheduled for:  next week   Plan: Telephone follow up appointment with care management team member scheduled for:  next week  Signature Regina Eck, PharmD, BCPS Clinical Pharmacist, [place]  [facility] Phone [phone]   Please call the care guide team at [phone] if you need to cancel or reschedule your appointment.   Patient verbalizes understanding of instructions and care plan provided today and agrees to view in [place]. Active MyChart status and patient understanding of how to access instructions and care plan via MyChart confirmed with patient.

## 2021-12-05 NOTE — Progress Notes (Signed)
Chronic Care Management Pharmacy Note  11/23/2021 Name:  Susan Petty MRN:  [id number] DOB:  10/09/1945  Summary:  Diabetes: Uncontrolled-A1C 7.4% with recurrent hypglycemia/falls; current treatment: HUMULIN N 30 UNITS IN AM, 5 UNITS IN PM; NOVOLIN R 3 UNITS 3X DAILY WITH MEALS, METFORMIN Patient hesitant to switch to basal monotherapy with tresiba, but having HYPOGLYCEMIA due to humulin N/R overlap & inconsistent diet PCP switched her to basal (tresiba) 20-30 units daily -- will continue to titrate Discontinued humlin R/N due to hypoglycemia Continue metformin Patient has not switched yet to our recommended plan She states she will do so over the weekend Will f/u with patient next week Patient wearing Libre 2 CGM; education provided; sample has been provided due to sensor malfunctions Having CGM is making patient more aware of what she is eating and how she is taking insulin GFR 56, A1c 7.4%, but HYPOGLYCEMIA Current glucose readings: fasting glucose: <150, post prandial glucose: varies--having 40-50s throughout the day--likely to due to insulin overlap of intermediate insulin and regular insulin Reports hypoglycemic/hyperglycemic symptoms (counseled on hypo/hyper) Discussed meal planning options and Plate method for healthy eating Avoid sugary drinks and desserts Incorporate balanced protein, non starchy veggies, 1 serving of carbohydrate with each meal Increase water intake Increase physical activity as able Current exercise: n/a-does not walk like she used to due to age Educated on libre CGM 2 --gets via mailed to her home from [facility] ([facility]ach[employer] portal)  Subjective: Susan Petty is an 76 y.o. year old female who is a primary patient of Janora Norlander, DO.  The CCM team was consulted for assistance with disease management and care coordination needs.    Engaged with patient by telephone for follow up visit in response to provider referral for  pharmacy case management and/or care coordination services.   Consent to Services:  The patient was given information about Chronic Care Management services, agreed to services, and gave verbal consent prior to initiation of services.  Please see initial visit note for detailed documentation.   Patient Care Team: Janora Norlander, DO as PCP - General (Family Medicine) Danie Binder, MD (Inactive) as Consulting Physician (Gastroenterology) Lavera Guise, [facility] (Pharmacist) Eloise Harman, DO as Consulting Physician (Internal Medicine)  Objective:  Lab Results  Component Value Date   CREATININE 0.78 07/26/2021   CREATININE 0.91 03/06/2021   CREATININE 0.99 08/28/2020    Lab Results  Component Value Date   HGBA1C 7.4 (H) 07/26/2021      Component Value Date/Time   CHOL 171 03/06/2021 0958   CHOL 180 12/14/2012 1611   TRIG 52 03/06/2021 0958   TRIG 102 08/29/2014 1336   TRIG 85 12/14/2012 1611   HDL 94 03/06/2021 0958   HDL 77 08/29/2014 1336   HDL 72 12/14/2012 1611   CHOLHDL 1.8 03/06/2021 0958   LDLCALC 66 03/06/2021 0958   LDLCALC 101 (H) 01/11/2014 0837   LDLCALC 91 12/14/2012 1611       Latest Ref Rng & Units 03/06/2021    9:58 AM 05/18/2020    8:54 AM 08/20/2019    8:09 AM  Hepatic Function  Total Protein 6.0 - 8.5 g/dL 6.9   7.0   6.7    Albumin 3.7 - 4.7 g/dL 4.5   4.5   4.3    AST 0 - 40 IU/L $Remov'21   22   21    'Pjrsof$ ALT 0 - 32 IU/L 17   14   12  Alk Phosphatase 44 - 121 IU/L 76   86   88    Total Bilirubin 0.0 - 1.2 mg/dL 0.4   0.3   <0.2      Lab Results  Component Value Date/Time   TSH 4.110 08/28/2020 01:02 PM   TSH 4.990 (H) 05/18/2020 08:54 AM       Latest Ref Rng & Units 07/26/2021    8:09 AM 11/22/2019    1:31 PM 08/20/2019    8:09 AM  CBC  WBC 3.4 - 10.8 x10E3/uL 5.4    6.0    Hemoglobin 11.1 - 15.9 g/dL 11.5    12.6    Hematocrit 34.0 - 46.6 % 34.5   35.9   36.4    Platelets 150 - 450 x10E3/uL 181    224      Lab Results  Component  Value Date/Time   VD25OH 59.3 05/18/2020 08:54 AM   VD25OH 70.9 12/11/2018 09:29 AM    Clinical ASCVD: No  The 10-year ASCVD risk score (Arnett DK, et al., 2019) is: 69.7%   Values used to calculate the score:     Age: 76 years     Sex: Female     Is Non-Hispanic African American: No     Diabetic: Yes     Tobacco smoker: No     Systolic Blood Pressure: 277 mmHg     Is BP treated: Yes     HDL Cholesterol: 94 mg/dL     Total Cholesterol: 171 mg/dL    Other: (CHADS2VASc if Afib, PHQ9 if depression, MMRC or CAT for COPD, ACT, DEXA)  Social History   Tobacco Use  Smoking Status Never  Smokeless Tobacco Never   BP Readings from Last 3 Encounters:  11/07/21 (!) 167/74  09/13/21 (!) 142/51  07/26/21 (!) 152/70   Pulse Readings from Last 3 Encounters:  11/07/21 67  09/13/21 61  07/26/21 62   Wt Readings from Last 3 Encounters:  11/07/21 148 lb 6.4 oz (67.3 kg)  09/13/21 149 lb 9.6 oz (67.9 kg)  07/26/21 147 lb (66.7 kg)    Assessment: Review of patient past medical history, allergies, medications, health status, including review of consultants reports, laboratory and other test data, was performed as part of comprehensive evaluation and provision of chronic care management services.   SDOH:  (Social Determinants of Health) assessments and interventions performed:    CCM Care Plan  Allergies  Allergen Reactions   Sulfa Antibiotics    Sulfa Drugs Cross Reactors Rash    Medications Reviewed Today     Reviewed by Lavera Guise, [facility] (Pharmacist) on 12/05/21 at 1153  Med List Status: <None>   Medication Order Taking? Sig Documenting Provider Last Dose Status Informant  Accu-Chek Softclix Lancets lancets [id number] No SMARTSIG:Topical 6 Times Daily [provider] Taking Active   alendronate (FOSAMAX) 70 MG tablet [id number] No TAKE 1 TABLET BY MOUTH ONCE A WEEK WITH  A  FULL  GLASS  OF  WATER  ON  AN  EMPTY  STOMACH Ronnie Doss M, DO Taking Active    amLODipine (NORVASC) 10 MG tablet [id number]  Take 1 tablet (10 mg total) by mouth daily. Ronnie Doss M, DO  Active   aspirin EC 81 MG tablet [id number] No Take 81 mg by mouth daily. [provider] Taking Active Self  benazepril (LOTENSIN) 40 MG tablet [id number]  Take 1 tablet (40 mg total) by mouth daily. Ronnie Doss M, DO  Active   blood glucose  meter kit and supplies [id number] No Dispense based on patient and insurance preference. Use up to four times daily as directed. (FOR ICD-10 E10.9, E11.9). Sonny Masters, FNP Taking Active   Blood Glucose Monitoring Suppl (ACCU-CHEK GUIDE) w/Device KIT [id number] No CHECK GLUCOSE SIX TIMES DAILY Dx E11.9 Delynn Flavin M, DO Taking Active   carbamide peroxide (DEBROX) 6.5 % OTIC solution [id number] No Place 5 drops into both ears 2 (two) times daily. Sonny Masters, FNP Taking Active   cetirizine (ZYRTEC) 5 MG tablet [id number] No Take 1 tablet by mouth once daily Gabriel Earing, FNP Taking Active   Cholecalciferol (VITAMIN D-3 PO) [id number] No Take 2,000 Units by mouth daily. [provider] Taking Active   Continuous Blood Gluc Sensor (FREESTYLE LIBRE 2 SENSOR) MISC [id number] No by Does not apply route. [provider] Taking Active   FLUoxetine (PROZAC) 10 MG capsule [id number] No Take 1 capsule (10 mg total) by mouth daily. Delynn Flavin M, DO Taking Active   fluticasone (FLONASE) 50 MCG/ACT nasal spray [id number] No Place 2 sprays into both nostrils daily. Johna Sheriff, MD Taking Active   glucose blood (ACCU-CHEK GUIDE) test strip [id number] No CHECK GLUCOSE SIX TIMES DAILY Dx E11.9 Delynn Flavin M, DO Taking Active   hydrochlorothiazide (MICROZIDE) 12.5 MG capsule [id number] No Take 1 capsule (12.5 mg total) by mouth daily. (NEEDS TO BE SEEN BEFORE NEXT REFILL) Delynn Flavin M, DO Taking Active   insulin degludec (TRESIBA FLEXTOUCH) 100 UNIT/ML FlexTouch Pen [id number]  Inject 20-30 Units into the skin  daily. Delynn Flavin M, DO  Active   Insulin Pen Needle (NOVOFINE PLUS PEN NEEDLE) 32G X 4 MM MISC [id number]  UAD with insulin pen E11.9 Raliegh Ip, DO  Active   LINZESS 290 MCG CAPS capsule [id number] No TAKE 1 CAPSULE BY MOUTH ONCE DAILY BEFORE BREAKFAST Anice Paganini, NP Taking Active   metFORMIN (GLUCOPHAGE) 500 MG tablet [id number] No Take 2 tablets (1,000 mg total) by mouth 2 (two) times daily with a meal. (NEEDS TO BE SEEN BEFORE NEXT REFILL) Delynn Flavin M, DO Taking Active   metoprolol tartrate (LOPRESSOR) 25 MG tablet [id number] No Take 1 tablet (25 mg total) by mouth 2 (two) times daily. (NEEDS TO BE SEEN BEFORE NEXT REFILL) Delynn Flavin M, DO Taking Active   nystatin cream (MYCOSTATIN) [id number] No Apply 1 application topically 2 (two) times daily. X7-10 days Delynn Flavin M, DO Taking Active   omeprazole (PRILOSEC) 40 MG capsule [id number] No Take 1 capsule (40 mg total) by mouth 2 (two) times daily. Delynn Flavin M, DO Taking Active   ondansetron (ZOFRAN) 4 MG tablet [id number] No Take 1 tablet (4 mg total) by mouth every 8 (eight) hours as needed for nausea or vomiting. Junie Spencer, FNP Taking Active   pravastatin (PRAVACHOL) 40 MG tablet [id number]  Take 1 tablet (40 mg total) by mouth daily. Delynn Flavin M, DO  Active   psyllium (METAMUCIL) 58.6 % powder [id number] No Take 1 packet by mouth daily. [provider] Taking Active   valACYclovir (VALTREX) 1000 MG tablet [id number] No Take 1 tablet by mouth twice daily Raliegh Ip, DO Taking Active             Patient Active Problem List   Diagnosis Date Noted   Dysuria 08/15/2020   Age-related osteoporosis without current pathological fracture 12/11/2018   Constipation 06/19/2016   Allergic rhinitis 04/22/2016   Bowel habit changes 03/20/2016   Bloating 03/20/2016  Body mass index (BMI) of 23.0-23.9 in adult 07/20/2015   GAD (generalized anxiety disorder) 03/14/2015   GERD  (gastroesophageal reflux disease) 12/14/2012   Type 2 diabetes mellitus with insulin therapy ([place]) 12/14/2012   Hyperlipidemia associated with type 2 diabetes mellitus ([place]) 12/14/2012   Hypertension associated with type 2 diabetes mellitus ([place]) 12/14/2012   S/P left THA, 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    Immunization History  Administered Date(s) Administered   Fluad Quad(high Dose 65+) 04/21/2019, 05/18/2020, 05/03/2021   Influenza, High Dose Seasonal PF 05/05/2013, 04/08/2017, 04/14/2018   Influenza,inj,Quad PF,6+ Mos 04/13/2014   Influenza-Unspecified 04/20/2012   Moderna Sars-Covid-2 Vaccination 09/13/2020, 11/07/2020, 03/28/2021   Pneumococcal Conjugate-13 03/14/2015   Pneumococcal Polysaccharide-23 04/14/2018   Td 10/18/1996   Tdap 05/16/2012    Conditions to be addressed/monitored: DMII  Care Plan : PHARMD MEDICATION MANAGEMENT  Updates made by Lavera Guise, [place] since 12/05/2021 12:00 AM     Problem: DISEASE PROGRESSION PREVENTION      Long-Range Goal: T2DM   Recent Progress: Not on track  Priority: High  Note:   Current Barriers:  Unable to independently monitor therapeutic efficacy Unable to maintain control of T2DM  Pharmacist Clinical Goal(s):  Over the next 90 days, patient will achieve adherence to monitoring guidelines and medication adherence to achieve therapeutic efficacy achieve control of T2DM as evidenced by GOAL A1C<7%, IMPROVED GLYCEMIC CONTROL, AVOID HYPOGLYCEMIA  through collaboration with PharmD and provider.    Interventions: 1:1 collaboration with Janora Norlander, DO regarding development and update of comprehensive plan of care as evidenced by provider attestation and co-signature Inter-disciplinary care team collaboration (see longitudinal plan of care) Comprehensive medication review performed; medication list updated in electronic medical record  Diabetes: Uncontrolled-A1C 7.4% with recurrent hypglycemia/falls; current treatment: HUMULIN  N 30 UNITS IN AM, 5 UNITS IN PM; NOVOLIN R 3 UNITS 3X DAILY WITH MEALS, METFORMIN Patient hesitant to switch to basal monotherapy with tresiba, but having HYPOGLYCEMIA We switched her to basal (tresiba) 20 units daily Discontinues humlin R/N due to hypoglycemia Continue metformin Patient has not switched yet to our recommended plan She states she will do so over the weekend Will f/u with patient next week Patient wearing Libre 2 CGM; education provided; sample has been provided due to sensor malfunctions Having CGM is making patient more aware of what she is eating and how she is taking insulin Gfr 56, A1c 8.2% increasing, but HYPOGLYCEMIA Current glucose readings: fasting glucose: <150, post prandial glucose: varies Reports hypoglycemic/hyperglycemic symptoms (counseled on hypo/hyper) Discussed meal planning options and Plate method for healthy eating Avoid sugary drinks and desserts Incorporate balanced protein, non starchy veggies, 1 serving of carbohydrate with each meal Increase water intake Increase physical activity as able Current exercise: n/a-does not walk like she used to due to age Educated on libre CGM  2 --gets via mailed to her home from [facility] ([employer] portal)  Patient Goals/Self-Care Activities Over the next 90 days, patient will:  - take medications as prescribed check glucose daily, document, and provide at future appointments  Follow Up Plan: Telephone follow up appointment with care management team member scheduled for: Face to Face appointment with care management team member scheduled for:  next week      Follow Up:  Patient agrees to Care Plan and Follow-up.  Plan: Telephone follow up appointment with care management team member scheduled for:  next week    Regina Eck, PharmD, BCPS Clinical Pharmacist, [place]  II Phone [phone]

## 2021-12-06 ENCOUNTER — Ambulatory Visit (INDEPENDENT_AMBULATORY_CARE_PROVIDER_SITE_OTHER): Payer: Medicare Other | Admitting: Pharmacist

## 2021-12-06 DIAGNOSIS — E119 Type 2 diabetes mellitus without complications: Secondary | ICD-10-CM

## 2021-12-06 DIAGNOSIS — E1169 Type 2 diabetes mellitus with other specified complication: Secondary | ICD-10-CM

## 2021-12-06 NOTE — Progress Notes (Signed)
Chronic Care Management Pharmacy Note  12/06/2021 Name:  Susan Petty MRN:  [id number] DOB:  May 02, 1946  Summary:  Diabetes: Uncontrolled-A1C 7.4% with recurrent HYPOglycemia/falls; current treatment: TRESIBA PEN 73 UNITS AT LUNCH TIME, METFORMIN Transition has been made to [place] 20 units at lunch Patient admits to continued use of Humulin Regular insulin which PCP/PharmD have advised against at last 2 patient visits (reinforced this during appt today as patient had additional hypoglycemic events after Regular doses) Blood sugar appears to be improving per patient report Still higher post prandials Patient admits to drinking juice when blood sugar <150 so she doesn't have to eat --> this results in blood sugars of 300 Advised patient to eat 3 steady meals daily to help with this Dietary/habits are playing into patient's glycemic control Recommended higher protein/fiber options  INCREASE Tresiba to 22 units daily Discontinued Humlin R/N due to hypoglycemia Continue metformin Will f/u with patient next week Patient wearing Libre 2 CGM; education provided; sample has been provided due to sensor malfunctions Having CGM is making patient more aware of what she is eating and how she is taking insulin GFR 56, A1c 7.4%, but HYPOGLYCEMIA Current glucose readings: fasting glucose: <150, post prandial glucose: varies--having 40-50s throughout the day--likely to due to insulin overlap of intermediate insulin and regular insulin Reports hypoglycemic/hyperglycemic symptoms (counseled on hypo/hyperglycemia)  Subjective: Susan Petty is an 76 y.o. year old female who is a primary patient of Janora Norlander, DO.  The CCM team was consulted for assistance with disease management and care coordination needs.    Engaged with patient by telephone for follow up visit in response to provider referral for pharmacy case management and/or care coordination services.   Consent to Services:  The  patient was given information about Chronic Care Management services, agreed to services, and gave verbal consent prior to initiation of services.  Please see initial visit note for detailed documentation.   Patient Care Team: Janora Norlander, DO as PCP - General (Family Medicine) Danie Binder, MD (Inactive) as Consulting Physician (Gastroenterology) Lavera Guise, [facility] (Pharmacist) Eloise Harman, DO as Consulting Physician (Internal Medicine)  Objective:  Lab Results  Component Value Date   CREATININE 0.78 07/26/2021   CREATININE 0.91 03/06/2021   CREATININE 0.99 08/28/2020    Lab Results  Component Value Date   HGBA1C 7.4 (H) 07/26/2021   Last diabetic Eye exam: No results found for: HMDIABEYEEXA  Last diabetic Foot exam: No results found for: HMDIABFOOTEX      Component Value Date/Time   CHOL 171 03/06/2021 0958   CHOL 180 12/14/2012 1611   TRIG 52 03/06/2021 0958   TRIG 102 08/29/2014 1336   TRIG 85 12/14/2012 1611   HDL 94 03/06/2021 0958   HDL 77 08/29/2014 1336   HDL 72 12/14/2012 1611   CHOLHDL 1.8 03/06/2021 0958   LDLCALC 66 03/06/2021 0958   LDLCALC 101 (H) 01/11/2014 0837   LDLCALC 91 12/14/2012 1611       Latest Ref Rng & Units 03/06/2021    9:58 AM 05/18/2020    8:54 AM 08/20/2019    8:09 AM  Hepatic Function  Total Protein 6.0 - 8.5 g/dL 6.9   7.0   6.7    Albumin 3.7 - 4.7 g/dL 4.5   4.5   4.3    AST 0 - 40 IU/L '21   22   21    ' ALT 0 - 32 IU/L 17   14  12    Alk Phosphatase 44 - 121 IU/L 76   86   88    Total Bilirubin 0.0 - 1.2 mg/dL 0.4   0.3   <0.2      Lab Results  Component Value Date/Time   TSH 4.110 08/28/2020 01:02 PM   TSH 4.990 (H) 05/18/2020 08:54 AM       Latest Ref Rng & Units 07/26/2021    8:09 AM 11/22/2019    1:31 PM 08/20/2019    8:09 AM  CBC  WBC 3.4 - 10.8 x10E3/uL 5.4    6.0    Hemoglobin 11.1 - 15.9 g/dL 11.5    12.6    Hematocrit 34.0 - 46.6 % 34.5   35.9   36.4    Platelets 150 - 450 x10E3/uL 181     224      Lab Results  Component Value Date/Time   VD25OH 59.3 05/18/2020 08:54 AM   VD25OH 70.9 12/11/2018 09:29 AM    Clinical ASCVD: No  The 10-year ASCVD risk score (Arnett DK, et al., 2019) is: 69.7%   Values used to calculate the score:     Age: 76 years     Sex: Female     Is Non-Hispanic African American: No     Diabetic: Yes     Tobacco smoker: No     Systolic Blood Pressure: 782 mmHg     Is BP treated: Yes     HDL Cholesterol: 94 mg/dL     Total Cholesterol: 171 mg/dL    Other: (CHADS2VASc if Afib, PHQ9 if depression, MMRC or CAT for COPD, ACT, DEXA)  Social History   Tobacco Use  Smoking Status Never  Smokeless Tobacco Never   BP Readings from Last 3 Encounters:  11/07/21 (!) 167/74  09/13/21 (!) 142/51  07/26/21 (!) 152/70   Pulse Readings from Last 3 Encounters:  11/07/21 67  09/13/21 61  07/26/21 62   Wt Readings from Last 3 Encounters:  11/07/21 148 lb 6.4 oz (67.3 kg)  09/13/21 149 lb 9.6 oz (67.9 kg)  07/26/21 147 lb (66.7 kg)    Assessment: Review of patient past medical history, allergies, medications, health status, including review of consultants reports, laboratory and other test data, was performed as part of comprehensive evaluation and provision of chronic care management services.   SDOH:  (Social Determinants of Health) assessments and interventions performed:    CCM Care Plan  Allergies  Allergen Reactions   Sulfa Antibiotics    Sulfa Drugs Cross Reactors Rash    Medications Reviewed Today     Reviewed by Lavera Guise, [facility] (Pharmacist) on 12/06/21 at 1438  Med List Status: <None>   Medication Order Taking? Sig Documenting Provider Last Dose Status Informant  Accu-Chek Softclix Lancets lancets [id number] No SMARTSIG:Topical 6 Times Daily [provider] Taking Active   alendronate (FOSAMAX) 70 MG tablet [id number] No TAKE 1 TABLET BY MOUTH ONCE A WEEK WITH  A  FULL  GLASS  OF  WATER  ON  AN  EMPTY  STOMACH  Ronnie Doss M, DO Taking Active   amLODipine (NORVASC) 10 MG tablet [id number]  Take 1 tablet (10 mg total) by mouth daily. Ronnie Doss M, DO  Active   aspirin EC 81 MG tablet [id number] No Take 81 mg by mouth daily. [provider] Taking Active Self  benazepril (LOTENSIN) 40 MG tablet [id number]  Take 1 tablet (40 mg total) by mouth daily. Janora Norlander, DO  Active  blood glucose meter kit and supplies [id number] No Dispense based on patient and insurance preference. Use up to four times daily as directed. (FOR ICD-10 E10.9, E11.9). Baruch Gouty, FNP Taking Active   Blood Glucose Monitoring Suppl (ACCU-CHEK GUIDE) w/Device KIT [id number] No CHECK GLUCOSE SIX TIMES DAILY Dx E11.9 Ronnie Doss M, DO Taking Active   carbamide peroxide (DEBROX) 6.5 % OTIC solution [id number] No Place 5 drops into both ears 2 (two) times daily. Baruch Gouty, FNP Taking Active   cetirizine (ZYRTEC) 5 MG tablet [id number] No Take 1 tablet by mouth once daily Gwenlyn Perking, FNP Taking Active   Cholecalciferol (VITAMIN D-3 PO) [id number] No Take 2,000 Units by mouth daily. [provider] Taking Active   Continuous Blood Gluc Sensor (FREESTYLE LIBRE 2 SENSOR) MISC [id number] No by Does not apply route. [provider] Taking Active   FLUoxetine (PROZAC) 10 MG capsule [id number] No Take 1 capsule (10 mg total) by mouth daily. Ronnie Doss M, DO Taking Active   fluticasone (FLONASE) 50 MCG/ACT nasal spray [id number] No Place 2 sprays into both nostrils daily. Eustaquio Maize, MD Taking Active   glucose blood (ACCU-CHEK GUIDE) test strip [id number] No CHECK GLUCOSE SIX TIMES DAILY Dx E11.9 Ronnie Doss M, DO Taking Active   hydrochlorothiazide (MICROZIDE) 12.5 MG capsule [id number] No Take 1 capsule (12.5 mg total) by mouth daily. (NEEDS TO BE SEEN BEFORE NEXT REFILL) Ronnie Doss M, DO Taking Active   insulin degludec (TRESIBA FLEXTOUCH) 100 UNIT/ML FlexTouch Pen  [id number]  Inject 20-30 Units into the skin daily. Ronnie Doss M, DO  Active   Insulin Pen Needle (NOVOFINE PLUS PEN NEEDLE) 32G X 4 MM MISC [id number]  UAD with insulin pen E11.9 Janora Norlander, DO  Active   LINZESS 290 MCG CAPS capsule [id number] No TAKE 1 CAPSULE BY MOUTH ONCE DAILY BEFORE BREAKFAST Carlis Stable, NP Taking Active   metFORMIN (GLUCOPHAGE) 500 MG tablet [id number] No Take 2 tablets (1,000 mg total) by mouth 2 (two) times daily with a meal. (NEEDS TO BE SEEN BEFORE NEXT REFILL) Ronnie Doss M, DO Taking Active   metoprolol tartrate (LOPRESSOR) 25 MG tablet [id number] No Take 1 tablet (25 mg total) by mouth 2 (two) times daily. (NEEDS TO BE SEEN BEFORE NEXT REFILL) Ronnie Doss M, DO Taking Active   nystatin cream (MYCOSTATIN) [id number] No Apply 1 application topically 2 (two) times daily. X7-10 days Ronnie Doss M, DO Taking Active   omeprazole (PRILOSEC) 40 MG capsule [id number] No Take 1 capsule (40 mg total) by mouth 2 (two) times daily. Ronnie Doss M, DO Taking Active   ondansetron (ZOFRAN) 4 MG tablet [id number] No Take 1 tablet (4 mg total) by mouth every 8 (eight) hours as needed for nausea or vomiting. Sharion Balloon, FNP Taking Active   pravastatin (PRAVACHOL) 40 MG tablet [id number]  Take 1 tablet (40 mg total) by mouth daily. Ronnie Doss M, DO  Active   psyllium (METAMUCIL) 58.6 % powder [id number] No Take 1 packet by mouth daily. [provider] Taking Active   valACYclovir (VALTREX) 1000 MG tablet [id number] No Take 1 tablet by mouth twice daily Janora Norlander, DO Taking Active             Patient Active Problem List   Diagnosis Date Noted   Dysuria 08/15/2020   Age-related osteoporosis without current pathological fracture 12/11/2018   Constipation 06/19/2016   Allergic rhinitis 04/22/2016   Bowel habit changes 03/20/2016   Bloating  03/20/2016   Body mass index (BMI) of 23.0-23.9 in adult 07/20/2015   GAD  (generalized anxiety disorder) 03/14/2015   GERD (gastroesophageal reflux disease) 12/14/2012   Type 2 diabetes mellitus with insulin therapy ([place]) 12/14/2012   Hyperlipidemia associated with type 2 diabetes mellitus ([place]) 12/14/2012   Hypertension associated with type 2 diabetes mellitus ([place]) 12/14/2012   S/P left THA, 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    Immunization History  Administered Date(s) Administered   Fluad Quad(high Dose 65+) 04/21/2019, 05/18/2020, 05/03/2021   Influenza, High Dose Seasonal PF 05/05/2013, 04/08/2017, 04/14/2018   Influenza,inj,Quad PF,6+ Mos 04/13/2014   Influenza-Unspecified 04/20/2012   Moderna Sars-Covid-2 Vaccination 09/13/2020, 11/07/2020, 03/28/2021   Pneumococcal Conjugate-13 03/14/2015   Pneumococcal Polysaccharide-23 04/14/2018   Td 10/18/1996   Tdap 05/16/2012    Conditions to be addressed/monitored: HLD and DMII  Care Plan : PHARMD MEDICATION MANAGEMENT  Updates made by Lavera Guise, [place] since 12/06/2021 12:00 AM     Problem: DISEASE PROGRESSION PREVENTION      Long-Range Goal: T2DM   Recent Progress: Not on track  Priority: High  Note:   Current Barriers:  Unable to independently monitor therapeutic efficacy Unable to maintain control of T2DM  Pharmacist Clinical Goal(s):  Over the next 90 days, patient will achieve adherence to monitoring guidelines and medication adherence to achieve therapeutic efficacy achieve control of T2DM as evidenced by GOAL A1C<7%, IMPROVED GLYCEMIC CONTROL, AVOID HYPOGLYCEMIA  through collaboration with PharmD and provider.    Interventions: 1:1 collaboration with Janora Norlander, DO regarding development and update of comprehensive plan of care as evidenced by provider attestation and co-signature Inter-disciplinary care team collaboration (see longitudinal plan of care) Comprehensive medication review performed; medication list updated in electronic medical record  Diabetes: Uncontrolled-A1C 7.4% with  recurrent HYPOglycemia/falls; current treatment: TRESIBA PEN 22 UNITS AT LUNCH TIME, METFORMIN Transition has been made to [place] 20 units at lunch Patient admits to continued use of Humulin Regular insulin which PCP/PharmD have advised against at last 2 patient visits (reinforced this during appt today as patient had additional hypoglycemic events after Regular doses) Blood sugar appears to be improving per patient report Still higher post prandials Patient admits to drinking juice when blood sugar <150 so she doesn't have to eat --> this results in blood sugars of 300 Advised patient to eat 3 steady meals daily to help with this Dietary/habits are playing into patient's glycemic control Recommended higher protein/fiber options  INCREASE Tresiba to 22 units daily Discontinued Humlin R/N due to hypoglycemia Continue metformin Will f/u with patient next week Patient wearing Libre 2 CGM; education provided; sample has been provided due to sensor malfunctions Having CGM is making patient more aware of what she is eating and how she is taking insulin GFR 56, A1c 7.4%, but HYPOGLYCEMIA Current glucose readings: fasting glucose: <150, post prandial glucose: varies--having 40-50s throughout the day--likely to due to insulin overlap of intermediate insulin and regular insulin Reports hypoglycemic/hyperglycemic symptoms (counseled on hypo/hyperglycemia)  Patient Goals/Self-Care Activities Over the next 90 days, patient will:  - take medications as prescribed check glucose daily, document, and provide at future appointments  Follow Up Plan: Telephone follow up appointment with care management team member scheduled for: Face to Face appointment with care management team member scheduled for:  next week      Medication Assistance: None required.  Patient affirms current coverage meets needs.  Patient's preferred pharmacy is:  [facility] [address] [place] [address] [place]  [address] [place] [address] Phone: [phone] Fax: [phone]  [id number] [id number]: [phone]: [phone] Follow Up:  Patient agrees to Care Plan and Follow-up.  Plan: Telephone follow up appointment with care management team member scheduled for:  12/14/21   JRegina Eck PharmD, BCPS Clinical Pharmacist, [place] Phone [phone]

## 2021-12-06 NOTE — Patient Instructions (Addendum)
Visit Information  Following are the goals we discussed today:  Current Barriers:  Unable to independently monitor therapeutic efficacy Unable to maintain control of T2DM  Pharmacist Clinical Goal(s):  Over the next 90 days, patient will achieve adherence to monitoring guidelines and medication adherence to achieve therapeutic efficacy achieve control of T2DM as evidenced by GOAL A1C<7%, IMPROVED GLYCEMIC CONTROL, AVOID HYPOGLYCEMIA through collaboration with PharmD and provider.    Interventions: 1:1 collaboration with Janora Norlander, DO regarding development and update of comprehensive plan of care as evidenced by provider attestation and co-signature Inter-disciplinary care team collaboration (see longitudinal plan of care) Comprehensive medication review performed; medication list updated in electronic medical record  Diabetes: Uncontrolled-A1C 7.4% with recurrent HYPOglycemia/falls; current treatment: TRESIBA PEN 22 UNITS AT LUNCH TIME, METFORMIN Transition has been made to [place] 20 units at lunch Patient admits to continued use of Humulin Regular insulin which PCP/PharmD have advised against at last 2 patient visits (reinforced this during appt today as patient had additional hypoglycemic events after Regular doses) Blood sugar appears to be improving per patient report Still higher post prandials Patient admits to drinking juice when blood sugar <150 so she doesn't have to eat --> this results in blood sugars of 300 Advised patient to eat 3 steady meals daily to help with this Dietary/habits are playing into patient's glycemic control Recommended higher protein/fiber options  INCREASE Tresiba to 22 units daily Discontinued Humlin R/N due to hypoglycemia Continue metformin Will f/u with patient next week Patient wearing Libre 2 CGM; education provided; sample has been provided due to sensor malfunctions Having CGM is making patient more aware of what she is eating and how  she is taking insulin GFR 56, A1c 7.4%, but HYPOGLYCEMIA Current glucose readings: fasting glucose: <150, post prandial glucose: varies--having 40-50s throughout the day--likely to due to insulin overlap of intermediate insulin and regular insulin Reports hypoglycemic/hyperglycemic symptoms (counseled on hypo/hyperglycemia)  Patient Goals/Self-Care Activities Over the next 90 days, patient will:  - take medications as prescribed check glucose daily, document, and provide at future appointments  Follow Up Plan: Telephone follow up appointment with care management team member scheduled for: Face to Face appointment with care management team member scheduled for:  next week   Plan: Telephone follow up appointment with care management team member scheduled for:  2 weeks  Signature Regina Eck, PharmD, BCPS Clinical Pharmacist, [place]  [place] Phone [phone]   Please call the care guide team at [phone] if you need to cancel or reschedule your appointment.   Patient verbalizes understanding of instructions and care plan provided today and agrees to view in [place]. Active MyChart status and patient understanding of how to access instructions and care plan via MyChart confirmed with patient.

## 2021-12-09 DIAGNOSIS — E1165 Type 2 diabetes mellitus with hyperglycemia: Secondary | ICD-10-CM | POA: Diagnosis not present

## 2021-12-14 ENCOUNTER — Ambulatory Visit: Payer: Medicare Other | Admitting: Pharmacist

## 2021-12-14 DIAGNOSIS — E119 Type 2 diabetes mellitus without complications: Secondary | ICD-10-CM

## 2021-12-14 DIAGNOSIS — E1169 Type 2 diabetes mellitus with other specified complication: Secondary | ICD-10-CM

## 2021-12-14 NOTE — Patient Instructions (Addendum)
Visit Information  Following are the goals we discussed today:  Current Barriers:  Unable to independently monitor therapeutic efficacy Unable to maintain control of T2DM  Pharmacist Clinical Goal(s):  Over the next 90 days, patient will achieve adherence to monitoring guidelines and medication adherence to achieve therapeutic efficacy achieve control of T2DM as evidenced by GOAL A1C<7%, IMPROVED GLYCEMIC CONTROL, AVOID HYPOGLYCEMIA through collaboration with PharmD and provider.    Interventions: 1:1 collaboration with Janora Norlander, DO regarding development and update of comprehensive plan of care as evidenced by provider attestation and co-signature Inter-disciplinary care team collaboration (see longitudinal plan of care) Comprehensive medication review performed; medication list updated in electronic medical record  Diabetes: Uncontrolled-A1C 7.4% with recurrent HYPOglycemia/falls (improved--only 1 episode of hypoglycemia); current treatment: TRESIBA PEN 20-22 UNITS AT LUNCH TIME, METFORMIN Transition has been made to [place] 20 units at 9am Patient admits to continued use of Humulin Regular insulin (using much less) which PCP/PharmD have advised against at last 2 patient visits (reinforced this during appt today as patient had additional hypoglycemic events after Regular doses) Blood sugar appears to be improving per patient report Still higher post prandials--work on diet/slight increase in basal insulin Patient admits to drinking juice when blood sugar <150 so she doesn't have to eat --> this results in blood sugars of 300 Advised patient to eat 3 steady meals daily to help with this Dietary/habits are playing into patient's glycemic control Recommended higher protein/fiber options  INCREASE Tresiba to 22 units daily Discontinued Humlin R/N due to hypoglycemia Continue metformin Will f/u with patient next month Patient wearing Libre 2 CGM; education provided; sample has  been provided due to sensor malfunctions Having CGM is making patient more aware of what she is eating and how she is taking insulin GFR 56, A1c 7.4%, but HYPOGLYCEMIA Current glucose readings: fasting glucose: <150, post prandial glucose: varies--having 40-50s throughout the day--likely to due to insulin overlap of intermediate insulin and regular insulin Reports hypoglycemic/hyperglycemic symptoms (counseled on hypo/hyperglycemia)  Patient Goals/Self-Care Activities Over the next 90 days, patient will:  - take medications as prescribed check glucose daily, document, and provide at future appointments  Follow Up Plan: Telephone follow up appointment with care management team member scheduled for: Face to Face appointment with care management team member scheduled for:  next month   Plan: Telephone follow up appointment with care management team member scheduled for:  1 month  Signature Regina Eck, PharmD, BCPS Clinical Pharmacist, [place] Phone [phone]   Please call the care guide team at [phone] if you need to cancel or reschedule your appointment.   The patient verbalized understanding of instructions, educational materials, and care plan provided today and DECLINED offer to receive copy of patient instructions, educational materials, and care plan.

## 2021-12-14 NOTE — Progress Notes (Signed)
Chronic Care Management Pharmacy Note  12/14/2021 Name:  Susan Petty MRN:  [id number] DOB:  [birth year]  Summary: Diabetes: Uncontrolled-A1C 7.4% with recurrent HYPOglycemia/falls (improved--only 1 episode of hypoglycemia); current treatment: TRESIBA PEN 20-22 UNITS AT LUNCH TIME, METFORMIN Transition has been made to [place] 20 units at 9am Patient admits to continued use of Humulin Regular insulin (using much less) which PCP/PharmD have advised against at last 2 patient visits (reinforced this during appt today as patient had additional hypoglycemic events after Regular doses) Blood sugar appears to be improving per patient report Still higher post prandials--work on diet/slight increase in basal insulin Patient admits to drinking juice when blood sugar <150 so she doesn't have to eat --> this results in blood sugars of 300 Advised patient to eat 3 steady meals daily to help with this Dietary/habits are playing into patient's glycemic control Recommended higher protein/fiber options  INCREASE Tresiba to 22 units daily Discontinued Humlin R/N due to hypoglycemia Continue metformin Will f/u with patient next month Patient wearing Libre 2 CGM; education provided; sample has been provided due to sensor malfunctions Having CGM is making patient more aware of what she is eating and how she is taking insulin GFR 56, A1c 7.4%, but HYPOGLYCEMIA Current glucose readings: fasting glucose: <150, post prandial glucose: varies--having 40-50s throughout the day--likely to due to insulin overlap of intermediate insulin and regular insulin Reports hypoglycemic/hyperglycemic symptoms (counseled on hypo/hyperglycemia)   Subjective: Susan Petty is an 76 y.o. year old female who is a primary patient of Janora Norlander, DO.  The CCM team was consulted for assistance with disease management and care coordination needs.    Engaged with patient by telephone for follow up visit in response to  provider referral for pharmacy case management and/or care coordination services.   Consent to Services:  The patient was given information about Chronic Care Management services, agreed to services, and gave verbal consent prior to initiation of services.  Please see initial visit note for detailed documentation.   Patient Care Team: Janora Norlander, DO as PCP - General (Family Medicine) Danie Binder, MD (Inactive) as Consulting Physician (Gastroenterology) Lavera Guise, [facility] (Pharmacist) Eloise Harman, DO as Consulting Physician (Internal Medicine)  Objective:  Lab Results  Component Value Date   CREATININE 0.78 07/26/2021   CREATININE 0.91 03/06/2021   CREATININE 0.99 08/28/2020    Lab Results  Component Value Date   HGBA1C 7.4 (H) 07/26/2021   Last diabetic Eye exam: No results found for: "HMDIABEYEEXA"  Last diabetic Foot exam: No results found for: "HMDIABFOOTEX"      Component Value Date/Time   CHOL 171 03/06/2021 0958   CHOL 180 12/14/2012 1611   TRIG 52 03/06/2021 0958   TRIG 102 08/29/2014 1336   TRIG 85 12/14/2012 1611   HDL 94 03/06/2021 0958   HDL 77 08/29/2014 1336   HDL 72 12/14/2012 1611   CHOLHDL 1.8 03/06/2021 0958   LDLCALC 66 03/06/2021 0958   LDLCALC 101 (H) 01/11/2014 0837   LDLCALC 91 12/14/2012 1611       Latest Ref Rng & Units 03/06/2021    9:58 AM 05/18/2020    8:54 AM 08/20/2019    8:09 AM  Hepatic Function  Total Protein 6.0 - 8.5 g/dL 6.9  7.0  6.7   Albumin 3.7 - 4.7 g/dL 4.5  4.5  4.3   AST 0 - 40 IU/L _0 ALT 0 - 32  IU/L _0 Alk Phosphatase 44 - 121 IU/L 76  86  88   Total Bilirubin 0.0 - 1.2 mg/dL 0.4  0.3  <0.2     Lab Results  Component Value Date/Time   TSH 4.110 08/28/2020 01:02 PM   TSH 4.990 (H) 05/18/2020 08:54 AM       Latest Ref Rng & Units 07/26/2021    8:09 AM 11/22/2019    1:31 PM 08/20/2019    8:09 AM  CBC  WBC 3.4 - 10.8 x10E3/uL 5.4   6.0   Hemoglobin 11.1 - 15.9 g/dL 11.5    12.6   Hematocrit 34.0 - 46.6 % 34.5  35.9  36.4   Platelets 150 - 450 x10E3/uL 181   224     Lab Results  Component Value Date/Time   VD25OH 59.3 05/18/2020 08:54 AM   VD25OH 70.9 12/11/2018 09:29 AM    Clinical ASCVD: No  The 10-year ASCVD risk score (Arnett DK, et al., 2019) is: 69.7%   Values used to calculate the score:     Age: 80 years     Sex: Female     Is Non-Hispanic African American: No     Diabetic: Yes     Tobacco smoker: No     Systolic Blood Pressure: 559 mmHg     Is BP treated: Yes     HDL Cholesterol: 94 mg/dL     Total Cholesterol: 171 mg/dL    Other: (CHADS2VASc if Afib, PHQ9 if depression, MMRC or CAT for COPD, ACT, DEXA)  Social History   Tobacco Use  Smoking Status Never  Smokeless Tobacco Never   BP Readings from Last 3 Encounters:  11/07/21 (!) 167/74  09/13/21 (!) 142/51  07/26/21 (!) 152/70   Pulse Readings from Last 3 Encounters:  11/07/21 67  09/13/21 61  07/26/21 62   Wt Readings from Last 3 Encounters:  11/07/21 148 lb 6.4 oz (67.3 kg)  09/13/21 149 lb 9.6 oz (67.9 kg)  07/26/21 147 lb (66.7 kg)    Assessment: Review of patient past medical history, allergies, medications, health status, including review of consultants reports, laboratory and other test data, was performed as part of comprehensive evaluation and provision of chronic care management services.   SDOH:  (Social Determinants of Health) assessments and interventions performed:    CCM Care Plan  Allergies  Allergen Reactions   Sulfa Antibiotics    Sulfa Drugs Cross Reactors Rash    Medications Reviewed Today     Reviewed by Lavera Guise, [facility] (Pharmacist) on 12/14/21 at 1359  Med List Status: <None>   Medication Order Taking? Sig Documenting Provider Last Dose Status Informant  Accu-Chek Softclix Lancets lancets [id number] No SMARTSIG:Topical 6 Times Daily [provider] Taking Active   alendronate (FOSAMAX) 70 MG tablet [id number] No TAKE 1 TABLET  BY MOUTH ONCE A WEEK WITH  A  FULL  GLASS  OF  WATER  ON  AN  EMPTY  STOMACH Ronnie Doss M, DO Taking Active   amLODipine (NORVASC) 10 MG tablet [id number]  Take 1 tablet (10 mg total) by mouth daily. Ronnie Doss M, DO  Active   aspirin EC 81 MG tablet [id number] No Take 81 mg by mouth daily. [provider] Taking Active Self  benazepril (LOTENSIN) 40 MG tablet [id number]  Take 1 tablet (40 mg total) by mouth daily. Ronnie Doss M, DO  Active   blood glucose meter kit and supplies [id number] No Dispense  based on patient and insurance preference. Use up to four times daily as directed. (FOR ICD-10 E10.9, E11.9). Baruch Gouty, FNP Taking Active   Blood Glucose Monitoring Suppl (ACCU-CHEK GUIDE) w/Device KIT [id number] No CHECK GLUCOSE SIX TIMES DAILY Dx E11.9 Ronnie Doss M, DO Taking Active   carbamide peroxide (DEBROX) 6.5 % OTIC solution [id number] No Place 5 drops into both ears 2 (two) times daily. Baruch Gouty, FNP Taking Active   cetirizine (ZYRTEC) 5 MG tablet [id number] No Take 1 tablet by mouth once daily Gwenlyn Perking, FNP Taking Active   Cholecalciferol (VITAMIN D-3 PO) [id number] No Take 2,000 Units by mouth daily. [provider] Taking Active   Continuous Blood Gluc Sensor (FREESTYLE LIBRE 2 SENSOR) MISC [id number] No by Does not apply route. [provider] Taking Active   FLUoxetine (PROZAC) 10 MG capsule [id number] No Take 1 capsule (10 mg total) by mouth daily. Ronnie Doss M, DO Taking Active   fluticasone (FLONASE) 50 MCG/ACT nasal spray [id number] No Place 2 sprays into both nostrils daily. Eustaquio Maize, MD Taking Active   glucose blood (ACCU-CHEK GUIDE) test strip [id number] No CHECK GLUCOSE SIX TIMES DAILY Dx E11.9 Ronnie Doss M, DO Taking Active   hydrochlorothiazide (MICROZIDE) 12.5 MG capsule [id number] No Take 1 capsule (12.5 mg total) by mouth daily. (NEEDS TO BE SEEN BEFORE NEXT REFILL) Ronnie Doss M, DO Taking  Active   insulin degludec (TRESIBA FLEXTOUCH) 100 UNIT/ML FlexTouch Pen [id number]  Inject 20-30 Units into the skin daily.  Patient taking differently: Inject 22 Units into the skin daily.   Ronnie Doss M, DO  Active   Insulin Pen Needle (NOVOFINE PLUS PEN NEEDLE) 32G X 4 MM MISC [id number]  UAD with insulin pen E11.9 Janora Norlander, DO  Active   LINZESS 290 MCG CAPS capsule [id number] No TAKE 1 CAPSULE BY MOUTH ONCE DAILY BEFORE BREAKFAST Carlis Stable, NP Taking Active   metFORMIN (GLUCOPHAGE) 500 MG tablet [id number] No Take 2 tablets (1,000 mg total) by mouth 2 (two) times daily with a meal. (NEEDS TO BE SEEN BEFORE NEXT REFILL) Ronnie Doss M, DO Taking Active   metoprolol tartrate (LOPRESSOR) 25 MG tablet [id number] No Take 1 tablet (25 mg total) by mouth 2 (two) times daily. (NEEDS TO BE SEEN BEFORE NEXT REFILL) Ronnie Doss M, DO Taking Active   nystatin cream (MYCOSTATIN) [id number] No Apply 1 application topically 2 (two) times daily. X7-10 days Ronnie Doss M, DO Taking Active   omeprazole (PRILOSEC) 40 MG capsule [id number] No Take 1 capsule (40 mg total) by mouth 2 (two) times daily. Ronnie Doss M, DO Taking Active   ondansetron (ZOFRAN) 4 MG tablet [id number] No Take 1 tablet (4 mg total) by mouth every 8 (eight) hours as needed for nausea or vomiting. Sharion Balloon, FNP Taking Active   pravastatin (PRAVACHOL) 40 MG tablet [id number]  Take 1 tablet (40 mg total) by mouth daily. Ronnie Doss M, DO  Active   psyllium (METAMUCIL) 58.6 % powder [id number] No Take 1 packet by mouth daily. [provider] Taking Active   valACYclovir (VALTREX) 1000 MG tablet [id number] No Take 1 tablet by mouth twice daily Janora Norlander, DO Taking Active             Patient Active Problem List   Diagnosis Date Noted   Dysuria 08/15/2020   Age-related osteoporosis without current pathological fracture 12/11/2018   Constipation 06/19/2016   Allergic rhinitis  04/22/2016   Bowel habit  changes 03/20/2016   Bloating 03/20/2016   Body mass index (BMI) of 23.0-23.9 in adult 07/20/2015   GAD (generalized anxiety disorder) 03/14/2015   GERD (gastroesophageal reflux disease) 12/14/2012   Type 2 diabetes mellitus with insulin therapy ([place]) 12/14/2012   Hyperlipidemia associated with type 2 diabetes mellitus ([place]) 12/14/2012   Hypertension associated with type 2 diabetes mellitus ([employer]) 12/14/2012   S/P left THA, 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    Immunization History  Administered Date(s) Administered   Fluad Quad(high Dose 65+) 04/21/2019, 05/18/2020, 05/03/2021   Influenza, High Dose Seasonal PF 05/05/2013, 04/08/2017, 04/14/2018   Influenza,inj,Quad PF,6+ Mos 04/13/2014   Influenza-Unspecified 04/20/2012   Moderna Sars-Covid-2 Vaccination 09/13/2020, 11/07/2020, 03/28/2021   Pneumococcal Conjugate-13 03/14/2015   Pneumococcal Polysaccharide-23 04/14/2018   Td 10/18/1996   Tdap 05/16/2012    Conditions to be addressed/monitored: HLD and DMII  Care Plan : PHARMD MEDICATION MANAGEMENT  Updates made by Lavera Guise, [place] since 12/14/2021 12:00 AM     Problem: DISEASE PROGRESSION PREVENTION      Long-Range Goal: T2DM   Recent Progress: Not on track  Priority: High  Note:   Current Barriers:  Unable to independently monitor therapeutic efficacy Unable to maintain control of T2DM  Pharmacist Clinical Goal(s):  Over the next 90 days, patient will achieve adherence to monitoring guidelines and medication adherence to achieve therapeutic efficacy achieve control of T2DM as evidenced by GOAL A1C<7%, IMPROVED GLYCEMIC CONTROL, AVOID HYPOGLYCEMIA  through collaboration with PharmD and provider.    Interventions: 1:1 collaboration with Janora Norlander, DO regarding development and update of comprehensive plan of care as evidenced by provider attestation and co-signature Inter-disciplinary care team collaboration (see longitudinal plan of  care) Comprehensive medication review performed; medication list updated in electronic medical record  Diabetes: Uncontrolled-A1C 7.4% with recurrent HYPOglycemia/falls (improved--only 1 episode of hypoglycemia); current treatment: TRESIBA PEN 20-22 UNITS AT LUNCH TIME, METFORMIN Transition has been made to [place] 20 units at 9am Patient admits to continued use of Humulin Regular insulin (using much less) which PCP/PharmD have advised against at last 2 patient visits (reinforced this during appt today as patient had additional hypoglycemic events after Regular doses) Blood sugar appears to be improving per patient report Still higher post prandials--work on diet/slight increase in basal insulin Patient admits to drinking juice when blood sugar <150 so she doesn't have to eat --> this results in blood sugars of 300 Advised patient to eat 3 steady meals daily to help with this Dietary/habits are playing into patient's glycemic control Recommended higher protein/fiber options  INCREASE Tresiba to 22 units daily Discontinued Humlin R/N due to hypoglycemia Continue metformin Will f/u with patient next month Patient wearing Libre 2 CGM; education provided; sample has been provided due to sensor malfunctions Having CGM is making patient more aware of what she is eating and how she is taking insulin GFR 56, A1c 7.4%, but HYPOGLYCEMIA Current glucose readings: fasting glucose: <150, post prandial glucose: varies--having 40-50s throughout the day--likely to due to insulin overlap of intermediate insulin and regular insulin Reports hypoglycemic/hyperglycemic symptoms (counseled on hypo/hyperglycemia)  Patient Goals/Self-Care Activities Over the next 90 days, patient will:  - take medications as prescribed check glucose daily, document, and provide at future appointments  Follow Up Plan: Telephone follow up appointment with care management team member scheduled for: Face to Face appointment with  care management team member scheduled for:  next month    Follow Up:  Patient agrees to Care Plan and Follow-up.  Plan: Telephone  follow up appointment with care management team member scheduled for:  1 month   Regina Eck, PharmD, BCPS Clinical Pharmacist, [place]  [facility] Phone [phone]

## 2021-12-19 ENCOUNTER — Other Ambulatory Visit: Payer: Self-pay | Admitting: Family Medicine

## 2022-01-03 ENCOUNTER — Ambulatory Visit: Payer: Medicare Other | Admitting: Pharmacist

## 2022-01-03 DIAGNOSIS — E119 Type 2 diabetes mellitus without complications: Secondary | ICD-10-CM

## 2022-01-03 DIAGNOSIS — E1169 Type 2 diabetes mellitus with other specified complication: Secondary | ICD-10-CM

## 2022-01-03 NOTE — Progress Notes (Signed)
Chronic Care Management Pharmacy Note  01/03/2022 Name:  Susan Petty MRN:  [id number] DOB:  [birth year]  Summary:  Diabetes: Uncontrolled-A1C 7.4% with recurrent HYPOglycemia/falls (improved--only 1 episode of hypoglycemia); current treatment: TRESIBA PEN 20-22 UNITS AT LUNCH TIME, METFORMIN Transition has been made to [place] 20 units at 9am Patient admits to continued use of Humulin Regular insulin 2-3 units with lunch daily which PCP/PharmD have advised against at last 2 patient visits  Blood sugar appears to be improving per patient report Still higher post prandials--work on diet/slight increase in basal insulin Patient admits to drinking juice when blood sugar <150 so she doesn't have to eat --> this results in blood sugars of 300 Advised patient to eat 3 steady/balanced meals daily to help with this Dietary/habits are playing into patient's glycemic control (sweets, carbs, etc) Recommended higher protein/fiber options  Continue Tresiba to 22 units daily Discontinued Humlin R/N due to hypoglycemia Continue metformin Will f/u with patient in 3 months Patient wearing Libre 2 CGM; education provided; sample has been provided due to sensor malfunctions Having CGM is making patient more aware of what she is eating and how she is taking insulin GFR 56, A1c 7.4%, but HYPOGLYCEMIA Current glucose readings: fasting glucose: <150, post prandial glucose: varies--having 40-50s throughout the day--likely to due to insulin overlap of intermediate insulin and regular insulin Reports hypoglycemic/hyperglycemic symptoms (counseled on hypo/hyperglycemia)   Subjective: Susan Petty is an 76 y.o. year old female who is a primary patient of Susan Norlander, DO.  The CCM team was consulted for assistance with disease management and care coordination needs.    Engaged with patient by telephone for follow up visit in response to provider referral for pharmacy case management and/or care  coordination services.   Consent to Services:  The patient was given information about Chronic Care Management services, agreed to services, and gave verbal consent prior to initiation of services.  Please see initial visit note for detailed documentation.   Patient Care Team: Susan Norlander, DO as PCP - General (Family Medicine) Susan Binder, MD (Inactive) as Consulting Physician (Gastroenterology) Susan Petty, [facility] (Pharmacist) Susan Harman, DO as Consulting Physician (Internal Medicine)   Objective:  Lab Results  Component Value Date   CREATININE 0.78 07/26/2021   CREATININE 0.91 03/06/2021   CREATININE 0.99 08/28/2020    Lab Results  Component Value Date   HGBA1C 7.4 (H) 07/26/2021   Last diabetic Eye exam: No results found for: "HMDIABEYEEXA"  Last diabetic Foot exam: No results found for: "HMDIABFOOTEX"      Component Value Date/Time   CHOL 171 03/06/2021 0958   CHOL 180 12/14/2012 1611   TRIG 52 03/06/2021 0958   TRIG 102 08/29/2014 1336   TRIG 85 12/14/2012 1611   HDL 94 03/06/2021 0958   HDL 77 08/29/2014 1336   HDL 72 12/14/2012 1611   CHOLHDL 1.8 03/06/2021 0958   LDLCALC 66 03/06/2021 0958   LDLCALC 101 (H) 01/11/2014 0837   LDLCALC 91 12/14/2012 1611       Latest Ref Rng & Units 03/06/2021    9:58 AM 05/18/2020    8:54 AM 08/20/2019    8:09 AM  Hepatic Function  Total Protein 6.0 - 8.5 g/dL 6.9  7.0  6.7   Albumin 3.7 - 4.7 g/dL 4.5  4.5  4.3   AST 0 - 40 IU/L _0 ALT 0 - 32 IU/L 17  14  12   Alk Phosphatase 44 - 121 IU/L 76  86  88   Total Bilirubin 0.0 - 1.2 mg/dL 0.4  0.3  <0.2     Lab Results  Component Value Date/Time   TSH 4.110 08/28/2020 01:02 PM   TSH 4.990 (H) 05/18/2020 08:54 AM       Latest Ref Rng & Units 07/26/2021    8:09 AM 11/22/2019    1:31 PM 08/20/2019    8:09 AM  CBC  WBC 3.4 - 10.8 x10E3/uL 5.4   6.0   Hemoglobin 11.1 - 15.9 g/dL 11.5   12.6   Hematocrit 34.0 - 46.6 % 34.5  35.9  36.4    Platelets 150 - 450 x10E3/uL 181   224     Lab Results  Component Value Date/Time   VD25OH 59.3 05/18/2020 08:54 AM   VD25OH 70.9 12/11/2018 09:29 AM    Clinical ASCVD: No  The 10-year ASCVD risk score (Arnett DK, et al., 2019) is: 69.7%   Values used to calculate the score:     Age: 55 years     Sex: Female     Is Non-Hispanic African American: No     Diabetic: Yes     Tobacco smoker: No     Systolic Blood Pressure: 786 mmHg     Is BP treated: Yes     HDL Cholesterol: 94 mg/dL     Total Cholesterol: 171 mg/dL    Other: (CHADS2VASc if Afib, PHQ9 if depression, MMRC or CAT for COPD, ACT, DEXA)  Social History   Tobacco Use  Smoking Status Never  Smokeless Tobacco Never   BP Readings from Last 3 Encounters:  11/07/21 (!) 167/74  09/13/21 (!) 142/51  07/26/21 (!) 152/70   Pulse Readings from Last 3 Encounters:  11/07/21 67  09/13/21 61  07/26/21 62   Wt Readings from Last 3 Encounters:  11/07/21 148 lb 6.4 oz (67.3 kg)  09/13/21 149 lb 9.6 oz (67.9 kg)  07/26/21 147 lb (66.7 kg)    Assessment: Review of patient past medical history, allergies, medications, health status, including review of consultants reports, laboratory and other test data, was performed as part of comprehensive evaluation and provision of chronic care management services.   SDOH:  (Social Determinants of Health) assessments and interventions performed:    CCM Care Plan  Allergies  Allergen Reactions   Sulfa Antibiotics    Sulfa Drugs Cross Reactors Rash    Medications Reviewed Today     Reviewed by Susan Petty, [facility] (Pharmacist) on 12/14/21 at 1359  Med List Status: <None>   Medication Order Taking? Sig Documenting Provider Last Dose Status Informant  Accu-Chek Softclix Lancets lancets [id number] No SMARTSIG:Topical 6 Times Daily [provider] Taking Active   alendronate (FOSAMAX) 70 MG tablet [id number] No TAKE 1 TABLET BY MOUTH ONCE A WEEK WITH  A  FULL  GLASS  OF   WATER  ON  AN  EMPTY  STOMACH Ronnie Doss M, DO Taking Active   amLODipine (NORVASC) 10 MG tablet [id number]  Take 1 tablet (10 mg total) by mouth daily. Ronnie Doss M, DO  Active   aspirin EC 81 MG tablet [id number] No Take 81 mg by mouth daily. [provider] Taking Active Self  benazepril (LOTENSIN) 40 MG tablet [id number]  Take 1 tablet (40 mg total) by mouth daily. Ronnie Doss M, DO  Active   blood glucose meter kit and supplies [id number] No Dispense based on patient and insurance  preference. Use up to four times daily as directed. (FOR ICD-10 E10.9, E11.9). Baruch Gouty, FNP Taking Active   Blood Glucose Monitoring Suppl (ACCU-CHEK GUIDE) w/Device KIT [id number] No CHECK GLUCOSE SIX TIMES DAILY Dx E11.9 Ronnie Doss M, DO Taking Active   carbamide peroxide (DEBROX) 6.5 % OTIC solution [id number] No Place 5 drops into both ears 2 (two) times daily. Baruch Gouty, FNP Taking Active   cetirizine (ZYRTEC) 5 MG tablet [id number] No Take 1 tablet by mouth once daily Gwenlyn Perking, FNP Taking Active   Cholecalciferol (VITAMIN D-3 PO) [id number] No Take 2,000 Units by mouth daily. [provider] Taking Active   Continuous Blood Gluc Sensor (FREESTYLE LIBRE 2 SENSOR) MISC [id number] No by Does not apply route. [provider] Taking Active   FLUoxetine (PROZAC) 10 MG capsule [id number] No Take 1 capsule (10 mg total) by mouth daily. Ronnie Doss M, DO Taking Active   fluticasone (FLONASE) 50 MCG/ACT nasal spray [id number] No Place 2 sprays into both nostrils daily. Eustaquio Maize, MD Taking Active   glucose blood (ACCU-CHEK GUIDE) test strip [id number] No CHECK GLUCOSE SIX TIMES DAILY Dx E11.9 Ronnie Doss M, DO Taking Active   hydrochlorothiazide (MICROZIDE) 12.5 MG capsule [id number] No Take 1 capsule (12.5 mg total) by mouth daily. (NEEDS TO BE SEEN BEFORE NEXT REFILL) Ronnie Doss M, DO Taking Active   insulin degludec (TRESIBA FLEXTOUCH)  100 UNIT/ML FlexTouch Pen [id number]  Inject 20-30 Units into the skin daily.  Patient taking differently: Inject 22 Units into the skin daily.   Ronnie Doss M, DO  Active   Insulin Pen Needle (NOVOFINE PLUS PEN NEEDLE) 32G X 4 MM MISC [id number]  UAD with insulin pen E11.9 Susan Norlander, DO  Active   LINZESS 290 MCG CAPS capsule [id number] No TAKE 1 CAPSULE BY MOUTH ONCE DAILY BEFORE BREAKFAST Carlis Stable, NP Taking Active   metFORMIN (GLUCOPHAGE) 500 MG tablet [id number] No Take 2 tablets (1,000 mg total) by mouth 2 (two) times daily with a meal. (NEEDS TO BE SEEN BEFORE NEXT REFILL) Ronnie Doss M, DO Taking Active   metoprolol tartrate (LOPRESSOR) 25 MG tablet [id number] No Take 1 tablet (25 mg total) by mouth 2 (two) times daily. (NEEDS TO BE SEEN BEFORE NEXT REFILL) Ronnie Doss M, DO Taking Active   nystatin cream (MYCOSTATIN) [id number] No Apply 1 application topically 2 (two) times daily. X7-10 days Ronnie Doss M, DO Taking Active   omeprazole (PRILOSEC) 40 MG capsule [id number] No Take 1 capsule (40 mg total) by mouth 2 (two) times daily. Ronnie Doss M, DO Taking Active   ondansetron (ZOFRAN) 4 MG tablet [id number] No Take 1 tablet (4 mg total) by mouth every 8 (eight) hours as needed for nausea or vomiting. Sharion Balloon, FNP Taking Active   pravastatin (PRAVACHOL) 40 MG tablet [id number]  Take 1 tablet (40 mg total) by mouth daily. Ronnie Doss M, DO  Active   psyllium (METAMUCIL) 58.6 % powder [id number] No Take 1 packet by mouth daily. [provider] Taking Active   valACYclovir (VALTREX) 1000 MG tablet [id number] No Take 1 tablet by mouth twice daily Susan Norlander, DO Taking Active             Patient Active Problem List   Diagnosis Date Noted   Dysuria 08/15/2020   Age-related osteoporosis without current pathological fracture 12/11/2018   Constipation 06/19/2016   Allergic rhinitis 04/22/2016   Bowel habit changes 03/20/2016    Bloating  03/20/2016   Body mass index (BMI) of 23.0-23.9 in adult 07/20/2015   GAD (generalized anxiety disorder) 03/14/2015   GERD (gastroesophageal reflux disease) 12/14/2012   Type 2 diabetes mellitus with insulin therapy ([place]) 12/14/2012   Hyperlipidemia associated with type 2 diabetes mellitus ([place]) 12/14/2012   Hypertension associated with type 2 diabetes mellitus ([place]) 12/14/2012   S/P left THA, 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    Immunization History  Administered Date(s) Administered   Fluad Quad(high Dose 65+) 04/21/2019, 05/18/2020, 05/03/2021   Influenza, High Dose Seasonal PF 05/05/2013, 04/08/2017, 04/14/2018   Influenza,inj,Quad PF,6+ Mos 04/13/2014   Influenza-Unspecified 04/20/2012   Moderna Sars-Covid-2 Vaccination 09/13/2020, 11/07/2020, 03/28/2021   Pneumococcal Conjugate-13 03/14/2015   Pneumococcal Polysaccharide-23 04/14/2018   Td 10/18/1996   Tdap 05/16/2012    Conditions to be addressed/monitored: DMII  Care Plan : PHARMD MEDICATION MANAGEMENT  Updates made by Susan Petty, [place] since 01/04/2022 12:00 AM     Problem: DISEASE PROGRESSION PREVENTION      Long-Range Goal: T2DM   Recent Progress: Not on track  Priority: High  Note:   Current Barriers:  Unable to independently monitor therapeutic efficacy Unable to maintain control of T2DM  Pharmacist Clinical Goal(s):  Over the next 90 days, patient will achieve adherence to monitoring guidelines and medication adherence to achieve therapeutic efficacy achieve control of T2DM as evidenced by GOAL A1C<7%, IMPROVED GLYCEMIC CONTROL, AVOID HYPOGLYCEMIA  through collaboration with PharmD and provider.    Interventions: 1:1 collaboration with Susan Norlander, DO regarding development and update of comprehensive plan of care as evidenced by provider attestation and co-signature Inter-disciplinary care team collaboration (see longitudinal plan of care) Comprehensive medication review performed; medication  list updated in electronic medical record  Diabetes: Uncontrolled-A1C 7.4% with recurrent HYPOglycemia/falls (improved--only 1 episode of hypoglycemia); current treatment: TRESIBA PEN 20-22 UNITS AT LUNCH TIME, METFORMIN Transition has been made to [place] 20 units at 9am Patient admits to continued use of Humulin Regular insulin 2-3 units with lunch daily which PCP/PharmD have advised against at last 2 patient visits  Blood sugar appears to be improving per patient report Still higher post prandials--work on diet/slight increase in basal insulin Patient admits to drinking juice when blood sugar <150 so she doesn't have to eat --> this results in blood sugars of 300 Advised patient to eat 3 steady/balanced meals daily to help with this Dietary/habits are playing into patient's glycemic control (sweets, carbs, etc) Recommended higher protein/fiber options  Continue Tresiba to 22 units daily Discontinued Humlin R/N due to hypoglycemia Continue metformin Will f/u with patient in 3 months Patient wearing Libre 2 CGM; education provided; sample has been provided due to sensor malfunctions Having CGM is making patient more aware of what she is eating and how she is taking insulin GFR 56, A1c 7.4%, but HYPOGLYCEMIA Current glucose readings: fasting glucose: <150, post prandial glucose: varies--having 40-50s throughout the day--likely to due to insulin overlap of intermediate insulin and regular insulin Reports hypoglycemic/hyperglycemic symptoms (counseled on hypo/hyperglycemia)  Patient Goals/Self-Care Activities Over the next 90 days, patient will:  - take medications as prescribed check glucose daily, document, and provide at future appointments  Follow Up Plan: Telephone follow up appointment with care management team member scheduled for: Face to Face appointment with care management team member scheduled for:  3 months    Follow Up:  Patient agrees to Care Plan and Follow-up.  Plan:  Telephone follow up appointment with care management team member scheduled for:  3 months  Regina Eck, PharmD, BCPS Clinical Pharmacist, [place]  II Phone [phone]

## 2022-01-04 DIAGNOSIS — E119 Type 2 diabetes mellitus without complications: Secondary | ICD-10-CM

## 2022-01-04 DIAGNOSIS — E785 Hyperlipidemia, unspecified: Secondary | ICD-10-CM

## 2022-01-04 DIAGNOSIS — E1169 Type 2 diabetes mellitus with other specified complication: Secondary | ICD-10-CM

## 2022-01-04 DIAGNOSIS — Z794 Long term (current) use of insulin: Secondary | ICD-10-CM

## 2022-01-04 NOTE — Patient Instructions (Addendum)
Visit Information  Following are the goals we discussed today:  Current Barriers:  Unable to independently monitor therapeutic efficacy Unable to maintain control of T2DM  Pharmacist Clinical Goal(s):  Over the next 90 days, patient will achieve adherence to monitoring guidelines and medication adherence to achieve therapeutic efficacy achieve control of T2DM as evidenced by GOAL A1C<7%, IMPROVED GLYCEMIC CONTROL, AVOID HYPOGLYCEMIA through collaboration with PharmD and provider.    Interventions: 1:1 collaboration with Janora Norlander, DO regarding development and update of comprehensive plan of care as evidenced by provider attestation and co-signature Inter-disciplinary care team collaboration (see longitudinal plan of care) Comprehensive medication review performed; medication list updated in electronic medical record  Diabetes: Uncontrolled-A1C 7.4% with recurrent HYPOglycemia/falls (improved--only 1 episode of hypoglycemia); current treatment: TRESIBA PEN 20-22 UNITS AT LUNCH TIME, METFORMIN Transition has been made to [place] 20 units at 9am Patient admits to continued use of Humulin Regular insulin 2-3 units with lunch daily which PCP/PharmD have advised against at last 2 patient visits  Blood sugar appears to be improving per patient report Still higher post prandials--work on diet/slight increase in basal insulin Patient admits to drinking juice when blood sugar <150 so she doesn't have to eat --> this results in blood sugars of 300 Advised patient to eat 3 steady/balanced meals daily to help with this Dietary/habits are playing into patient's glycemic control (sweets, carbs, etc) Recommended higher protein/fiber options  Continue Tresiba to 22 units daily Discontinued Humlin R/N due to hypoglycemia Continue metformin Will f/u with patient in 3 months Patient wearing Libre 2 CGM; education provided; sample has been provided due to sensor malfunctions Having CGM is making  patient more aware of what she is eating and how she is taking insulin GFR 56, A1c 7.4%, but HYPOGLYCEMIA Current glucose readings: fasting glucose: <150, post prandial glucose: varies--having 40-50s throughout the day--likely to due to insulin overlap of intermediate insulin and regular insulin Reports hypoglycemic/hyperglycemic symptoms (counseled on hypo/hyperglycemia)  Patient Goals/Self-Care Activities Over the next 90 days, patient will:  - take medications as prescribed check glucose daily, document, and provide at future appointments  Follow Up Plan: Telephone follow up appointment with care management team member scheduled for: Face to Face appointment with care management team member scheduled for:  3 months   Plan: Telephone follow up appointment with care management team member scheduled for:  3 months  Signature Regina Eck, PharmD, BCPS Clinical Pharmacist, [place]  II Phone [phone]   Please call the care guide team at [phone] if you need to cancel or reschedule your appointment.   Patient verbalizes understanding of instructions and care plan provided today and agrees to view in [place]. Active MyChart status and patient understanding of how to access instructions and care plan via MyChart confirmed with patient.

## 2022-01-09 DIAGNOSIS — E119 Type 2 diabetes mellitus without complications: Secondary | ICD-10-CM | POA: Diagnosis not present

## 2022-01-25 ENCOUNTER — Encounter: Payer: Self-pay | Admitting: Family

## 2022-01-25 ENCOUNTER — Ambulatory Visit (INDEPENDENT_AMBULATORY_CARE_PROVIDER_SITE_OTHER): Payer: Medicare Other | Admitting: Family

## 2022-01-25 VITALS — BP 138/60 | HR 60 | Temp 97.7°F | Ht 64.0 in | Wt 149.6 lb

## 2022-01-25 DIAGNOSIS — R5383 Other fatigue: Secondary | ICD-10-CM | POA: Diagnosis not present

## 2022-01-25 DIAGNOSIS — G2581 Restless legs syndrome: Secondary | ICD-10-CM | POA: Diagnosis not present

## 2022-01-25 DIAGNOSIS — B372 Candidiasis of skin and nail: Secondary | ICD-10-CM

## 2022-01-25 DIAGNOSIS — L304 Erythema intertrigo: Secondary | ICD-10-CM | POA: Diagnosis not present

## 2022-01-25 DIAGNOSIS — R6889 Other general symptoms and signs: Secondary | ICD-10-CM | POA: Diagnosis not present

## 2022-01-25 MED ORDER — NYSTATIN 100000 UNIT/GM EX POWD: 1.0000 | Freq: Three times a day (TID) | CUTANEOUS | 1 refills | Status: DC

## 2022-01-25 MED ORDER — NYSTATIN 100000 UNIT/GM EX CREA: 1.0000 | TOPICAL_CREAM | Freq: Two times a day (BID) | CUTANEOUS | 0 refills | Status: DC

## 2022-01-25 NOTE — Progress Notes (Signed)
Subjective:    Patient ID: Susan Petty, female    DOB: 12-Oct-1945, 76 y.o.   MRN: [id number]  Chief Complaint  Patient presents with   Rash    Under breasts   PT presents to the office today with rash under bilateral breast that has been on going on and off for year. She has used nystatin cream with mild relief.   She is complaining of restless legs at night and waking up in the middle of the night with twitching of her legs. States this will wake her up.   She is also complaining of fatigue over the last year. Unsure if this is related to her age. Reports she feels tired, wore out.  Rash This is a recurrent problem. The current episode started in the past 7 days. The problem has been waxing and waning since onset. Location: under bilateral breast. The rash is characterized by itchiness, redness and burning. Treatments tried: nystatin cream. The treatment provided mild relief.      Review of Systems  Skin:  Positive for rash.  All other systems reviewed and are negative.      Objective:   Physical Exam Vitals reviewed.  Constitutional:      General: She is not in acute distress.    Appearance: She is well-developed.  HENT:     Head: Normocephalic and atraumatic.  Eyes:     Pupils: Pupils are equal, round, and reactive to light.  Neck:     Thyroid: No thyromegaly.  Cardiovascular:     Rate and Rhythm: Normal rate and regular rhythm.     Heart sounds: Normal heart sounds. No murmur heard. Pulmonary:     Effort: Pulmonary effort is normal. No respiratory distress.     Breath sounds: Normal breath sounds. No wheezing.  Abdominal:     General: Bowel sounds are normal. There is no distension.     Palpations: Abdomen is soft.     Tenderness: There is no abdominal tenderness.  Musculoskeletal:        General: No tenderness. Normal range of motion.     Cervical back: Normal range of motion and neck supple.  Skin:    General: Skin is warm and dry.     Findings:  Erythema (erythemas under bilatetal breasts) present.  Neurological:     Mental Status: She is alert and oriented to person, place, and time.     Cranial Nerves: No cranial nerve deficit.     Deep Tendon Reflexes: Reflexes are normal and symmetric.  Psychiatric:        Behavior: Behavior normal.        Thought Content: Thought content normal.        Judgment: Judgment normal.     BP 138/60   Pulse 60   Temp 97.7 F (36.5 C)   Ht _0  (1.626 m)   Wt 149 lb 9.6 oz (67.9 kg)   SpO2 98%   BMI 25.68 kg/m        Assessment & Plan:  Susan Petty comes in today with chief complaint of Rash (Under breasts)   Diagnosis and orders addressed:  1. Candidal skin infection Keep clean and dry Nystatin powder QID and Nystatin cream - BMP8+EGFR - nystatin (MYCOSTATIN/NYSTOP) powder; Apply 1 Application topically 3 (three) times daily.  Dispense: 60 g; Refill: 1 - nystatin cream (MYCOSTATIN); Apply 1 Application topically 2 (two) times daily. X7-10 days  Dispense: 30 g; Refill: 0  2. Restless leg  syndrome Avoid caffeine  If symptoms do not improve may need medication  - BMP8+EGFR - Iron, TIBC and Ferritin Panel  3. Other fatigue Labs pending  - CBC with Differential/Platelet - BMP8+EGFR - TSH - Iron, TIBC and Ferritin Panel  4. Intertrigo  - BMP8+EGFR - nystatin cream (MYCOSTATIN); Apply 1 Application topically 2 (two) times daily. X7-10 days  Dispense: 30 g; Refill: 0   Evelina Dun, FNP

## 2022-01-25 NOTE — Patient Instructions (Signed)
Restless Legs Syndrome Restless legs syndrome is a condition that causes uncomfortable feelings or sensations in the legs, especially while sitting or lying down. The sensations usually cause an overwhelming urge to move the legs. The arms can also sometimes be affected. The condition can range from mild to severe. The symptoms often interfere with a person's ability to sleep. What are the causes? The cause of this condition is not known. What increases the risk? The following factors may make you more likely to develop this condition: Being older than 50. Pregnancy. Being a woman. In general, the condition is more common in women than in men. A family history of the condition. Having iron deficiency. Overuse of caffeine, nicotine, or alcohol. Certain medical conditions, such as kidney disease, Parkinson's disease, or nerve damage. Certain medicines, such as those for high blood pressure, nausea, colds, allergies, depression, and some heart conditions. What are the signs or symptoms? The main symptom of this condition is uncomfortable sensations in the legs, such as: Pulling. Tingling. Prickling. Throbbing. Crawling. Burning. Usually, the sensations: Affect both sides of the body. Are worse when you sit or lie down. Are worse at night. These may make it difficult to fall asleep. Make you have a strong urge to move your legs. Are temporarily relieved by moving your legs or standing. The arms can also be affected, but this is rare. People who have this condition often have tiredness during the day because of their lack of sleep at night. How is this diagnosed? This condition may be diagnosed based on: Your symptoms. Blood tests. In some cases, you may be monitored in a sleep lab by a specialist (a sleep study). This can detect any disruptions in your sleep. How is this treated? This condition is treated by managing the symptoms. This may include: Lifestyle changes, such as  exercising, using relaxation techniques, and avoiding caffeine, alcohol, or tobacco. Iron supplements. Medicines. Parkinson's medications may be tried first. Anti-seizure medications can also be helpful. Follow these instructions at home: General instructions Take over-the-counter and prescription medicines only as told by your health care provider. Use methods to help relieve the uncomfortable sensations, such as: Massaging your legs. Walking or stretching. Taking a cold or hot bath. Keep all follow-up visits. This is important. Lifestyle     Practice good sleep habits. For example, go to bed and get up at the same time every day. Most adults should get 7-9 hours of sleep each night. Exercise regularly. Try to get at least 30 minutes of exercise most days of the week. Practice ways of relaxing, such as yoga or meditation. Avoid caffeine and alcohol. Do not use any products that contain nicotine or tobacco. These products include cigarettes, chewing tobacco, and vaping devices, such as e-cigarettes. If you need help quitting, ask your health care provider. Where to find more information National Institute of Neurological Disorders and Stroke: www.ninds.nih.gov Contact a health care provider if: Your symptoms get worse or they do not improve with treatment. Summary Restless legs syndrome is a condition that causes uncomfortable feelings or sensations in the legs, especially while sitting or lying down. The symptoms often interfere with your ability to sleep. This condition is treated by managing the symptoms. You may need to make lifestyle changes or take medicines. This information is not intended to replace advice given to you by your health care provider. Make sure you discuss any questions you have with your health care provider. Document Revised: 02/04/2021 Document Reviewed: 02/04/2021 Elsevier Patient Education    2023 [employer]  

## 2022-01-26 LAB — CBC WITH DIFFERENTIAL/PLATELET
Basophils Absolute: 0.1 10*3/uL (ref 0.0–0.2)
Basos: 2 %
EOS (ABSOLUTE): 0.3 10*3/uL (ref 0.0–0.4)
Eos: 6 %
Hematocrit: 34.1 % (ref 34.0–46.6)
Hemoglobin: 11.3 g/dL (ref 11.1–15.9)
Immature Grans (Abs): 0 10*3/uL (ref 0.0–0.1)
Immature Granulocytes: 0 %
Lymphocytes Absolute: 1.7 10*3/uL (ref 0.7–3.1)
Lymphs: 33 %
MCH: 30.8 pg (ref 26.6–33.0)
MCHC: 33.1 g/dL (ref 31.5–35.7)
MCV: 93 fL (ref 79–97)
Monocytes Absolute: 0.4 10*3/uL (ref 0.1–0.9)
Monocytes: 7 %
Neutrophils Absolute: 2.6 10*3/uL (ref 1.4–7.0)
Neutrophils: 52 %
Platelets: 176 10*3/uL (ref 150–450)
RBC: 3.67 x10E6/uL — ABNORMAL LOW (ref 3.77–5.28)
RDW: 13.3 % (ref 11.7–15.4)
WBC: 5 10*3/uL (ref 3.4–10.8)

## 2022-01-26 LAB — BMP8+EGFR
BUN/Creatinine Ratio: 17 (ref 12–28)
BUN: 13 mg/dL (ref 8–27)
CO2: 29 mmol/L (ref 20–29)
Calcium: 9.5 mg/dL (ref 8.7–10.3)
Chloride: 104 mmol/L (ref 96–106)
Creatinine, Ser: 0.76 mg/dL (ref 0.57–1.00)
Glucose: 216 mg/dL — ABNORMAL HIGH (ref 70–99)
Potassium: 4.9 mmol/L (ref 3.5–5.2)
Sodium: 143 mmol/L (ref 134–144)
eGFR: 80 mL/min/{1.73_m2} (ref >59)

## 2022-01-26 LAB — IRON,TIBC AND FERRITIN PANEL
Ferritin: 37 ng/mL (ref 15–150)
Iron Saturation: 19 % (ref 15–55)
Iron: 71 ug/dL (ref 27–139)
Total Iron Binding Capacity: 372 ug/dL (ref 250–450)
UIBC: 301 ug/dL (ref 118–369)

## 2022-01-26 LAB — TSH: TSH: 3.23 u[IU]/mL (ref 0.450–4.500)

## 2022-01-29 ENCOUNTER — Other Ambulatory Visit: Payer: Self-pay | Admitting: Family Medicine

## 2022-01-29 DIAGNOSIS — E1159 Type 2 diabetes mellitus with other circulatory complications: Secondary | ICD-10-CM

## 2022-01-29 DIAGNOSIS — E1165 Type 2 diabetes mellitus with hyperglycemia: Secondary | ICD-10-CM

## 2022-02-09 DIAGNOSIS — E119 Type 2 diabetes mellitus without complications: Secondary | ICD-10-CM | POA: Diagnosis not present

## 2022-02-20 ENCOUNTER — Ambulatory Visit: Payer: Medicare Other | Admitting: Family Medicine

## 2022-02-22 ENCOUNTER — Other Ambulatory Visit: Payer: Self-pay | Admitting: Family Medicine

## 2022-02-22 DIAGNOSIS — E1165 Type 2 diabetes mellitus with hyperglycemia: Secondary | ICD-10-CM

## 2022-03-02 ENCOUNTER — Other Ambulatory Visit: Payer: Self-pay | Admitting: Family Medicine

## 2022-03-02 DIAGNOSIS — J309 Allergic rhinitis, unspecified: Secondary | ICD-10-CM

## 2022-03-06 ENCOUNTER — Ambulatory Visit (INDEPENDENT_AMBULATORY_CARE_PROVIDER_SITE_OTHER): Payer: Medicare Other | Admitting: Pharmacist

## 2022-03-06 DIAGNOSIS — E119 Type 2 diabetes mellitus without complications: Secondary | ICD-10-CM

## 2022-03-06 DIAGNOSIS — E785 Hyperlipidemia, unspecified: Secondary | ICD-10-CM

## 2022-03-06 NOTE — Progress Notes (Signed)
Chronic Care Management Pharmacy Note  03/06/2022 Name:  Susan Petty MRN:  [id number] DOB:  [birth year]  Summary:  Diabetes: Uncontrolled-A1C 7.4% with recurrent HYPOglycemia/falls (improved--only 1 episode of hypoglycemia); current treatment: TRESIBA PEN 20-30 UNITS AT LUNCH TIME, METFORMIN Transition has been made to [place] 25 units at 9am Patient admits to continued use of Humulin Regular insulin 2-3 units with lunch daily -- discussed potentially giving rapid insulin for lunch time in the place of regular Blood sugar appears to be improving per patient report Dietary/habits are playing into patient's glycemic control (sweets, carbs, etc) Recommended higher protein/fiber options  Continue Tresiba to 25 units daily Continue metformin Will f/u with patient in 3 months Patient wearing Libre 2 CGM; education provided; sample has been provided due to sensor malfunctions Having CGM is making patient more aware of what she is eating and how she is taking insulin GFR 56, A1c 7.4% Current glucose readings: fasting glucose: <150, post prandial glucose: varies   Patient Goals/Self-Care Activities Over the next 90 days, patient will:  - take medications as prescribed check glucose daily, document, and provide at future appointments  Follow Up Plan: Telephone follow up appointment with care management team member scheduled for: Face to Face appointment with care management team member scheduled for:  3 months   Subjective: Susan Petty is an 76 y.o. year old female who is a primary patient of Susan Norlander, DO.  The CCM team was consulted for assistance with disease management and care coordination needs.    Engaged with patient by telephone for follow up visit in response to provider referral for pharmacy case management and/or care coordination services.   Consent to Services:  The patient was given information about Chronic Care Management services, agreed to services,  and gave verbal consent prior to initiation of services.  Please see initial visit note for detailed documentation.   Patient Care Team: Susan Norlander, DO as PCP - General (Family Medicine) Susan Binder, MD (Inactive) as Consulting Physician (Gastroenterology) Susan Petty, [facility] (Pharmacist) Susan Harman, DO as Consulting Physician (Internal Medicine)  Objective:  Lab Results  Component Value Date   CREATININE 0.76 01/25/2022   CREATININE 0.78 07/26/2021   CREATININE 0.91 03/06/2021    Lab Results  Component Value Date   HGBA1C 7.4 (H) 07/26/2021   Last diabetic Eye exam: No results found for: "HMDIABEYEEXA"  Last diabetic Foot exam: No results found for: "HMDIABFOOTEX"      Component Value Date/Time   CHOL 171 03/06/2021 0958   CHOL 180 12/14/2012 1611   TRIG 52 03/06/2021 0958   TRIG 102 08/29/2014 1336   TRIG 85 12/14/2012 1611   HDL 94 03/06/2021 0958   HDL 77 08/29/2014 1336   HDL 72 12/14/2012 1611   CHOLHDL 1.8 03/06/2021 0958   LDLCALC 66 03/06/2021 0958   LDLCALC 101 (H) 01/11/2014 0837   LDLCALC 91 12/14/2012 1611       Latest Ref Rng & Units 03/06/2021    9:58 AM 05/18/2020    8:54 AM 08/20/2019    8:09 AM  Hepatic Function  Total Protein 6.0 - 8.5 g/dL 6.9  7.0  6.7   Albumin 3.7 - 4.7 g/dL 4.5  4.5  4.3   AST 0 - 40 IU/L _0 ALT 0 - 32 IU/L _1 Alk Phosphatase 44 - 121 IU/L 76  86  88   Total  Bilirubin 0.0 - 1.2 mg/dL 0.4  0.3  <0.2     Lab Results  Component Value Date/Time   TSH 3.230 01/25/2022 08:45 AM   TSH 4.110 08/28/2020 01:02 PM       Latest Ref Rng & Units 01/25/2022    8:45 AM 07/26/2021    8:09 AM 11/22/2019    1:31 PM  CBC  WBC 3.4 - 10.8 x10E3/uL 5.0  5.4    Hemoglobin 11.1 - 15.9 g/dL 11.3  11.5    Hematocrit 34.0 - 46.6 % 34.1  34.5  35.9   Platelets 150 - 450 x10E3/uL 176  181      Lab Results  Component Value Date/Time   VD25OH 59.3 05/18/2020 08:54 AM   VD25OH 70.9 12/11/2018 09:29  AM    Clinical ASCVD: No  The 10-year ASCVD risk score (Arnett DK, et al., 2019) is: 55.6%   Values used to calculate the score:     Age: 22 years     Sex: Female     Is Non-Hispanic African American: No     Diabetic: Yes     Tobacco smoker: No     Systolic Blood Pressure: 161 mmHg     Is BP treated: Yes     HDL Cholesterol: 94 mg/dL     Total Cholesterol: 171 mg/dL    Other: (CHADS2VASc if Afib, PHQ9 if depression, MMRC or CAT for COPD, ACT, DEXA)  Social History   Tobacco Use  Smoking Status Never  Smokeless Tobacco Never   BP Readings from Last 3 Encounters:  01/25/22 138/60  11/07/21 (!) 167/74  09/13/21 (!) 142/51   Pulse Readings from Last 3 Encounters:  01/25/22 60  11/07/21 67  09/13/21 61   Wt Readings from Last 3 Encounters:  01/25/22 149 lb 9.6 oz (67.9 kg)  11/07/21 148 lb 6.4 oz (67.3 kg)  09/13/21 149 lb 9.6 oz (67.9 kg)    Assessment: Review of patient past medical history, allergies, medications, health status, including review of consultants reports, laboratory and other test data, was performed as part of comprehensive evaluation and provision of chronic care management services.   SDOH:  (Social Determinants of Health) assessments and interventions performed:    CCM Care Plan  Allergies  Allergen Reactions   Sulfa Antibiotics    Sulfa Drugs Cross Reactors Rash    Medications Reviewed Today     Reviewed by Susan Petty, [facility] (Pharmacist) on 03/06/22 at 1428  Med List Status: <None>   Medication Order Taking? Sig Documenting Provider Last Dose Status Informant  Accu-Chek Softclix Lancets lancets [id number]  SMARTSIG:Topical 6 Times Daily [provider]  Active   alendronate (FOSAMAX) 70 MG tablet [id number]  TAKE 1 TABLET BY MOUTH ONCE A WEEK WITH  A  FULL  GLASS  OF  WATER  ON  AN  EMPTY  STOMACH Susan Petty M, DO  Active   amLODipine (NORVASC) 10 MG tablet [id number]  Take 1 tablet (10 mg total) by mouth daily. Susan Petty M, DO  Active   aspirin EC 81 MG tablet [id number]  Take 81 mg by mouth daily. [provider]  Active Self  benazepril (LOTENSIN) 40 MG tablet [id number]  Take 1 tablet (40 mg total) by mouth daily. Susan Petty M, DO  Active   blood glucose meter kit and supplies [id number]  Dispense based on patient and insurance preference. Use up to four times daily as directed. (FOR ICD-10 E10.9, E11.9). Baruch Gouty, FNP  Active   Blood Glucose Monitoring Suppl (ACCU-CHEK GUIDE) w/Device KIT [id number]  CHECK GLUCOSE SIX TIMES DAILY Dx E11.9 Susan Petty M, DO  Active   carbamide peroxide (DEBROX) 6.5 % OTIC solution [id number]  Place 5 drops into both ears 2 (two) times daily. Baruch Gouty, FNP  Active   cetirizine (ZYRTEC) 5 MG tablet [id number]  Take 1 tablet by mouth once daily Gwenlyn Perking, FNP  Active   Cholecalciferol (VITAMIN D-3 PO) [id number]  Take 2,000 Units by mouth daily. [provider]  Active   Continuous Blood Gluc Sensor (FREESTYLE LIBRE 2 SENSOR) [place] [id number]  by Does not apply route. [provider]  Active            Med Note Blanca Friend, Royce Macadamia   Fri Jan 04, 2022  8:37 AM) ADS via parachute portal  FLUoxetine (PROZAC) 10 MG capsule [id number]  Take 1 capsule (10 mg total) by mouth daily. Susan Petty M, DO  Active   fluticasone (FLONASE) 50 MCG/ACT nasal spray [id number]  Place 2 sprays into both nostrils daily. Eustaquio Maize, MD  Active   glucose blood (ACCU-CHEK GUIDE) test strip [id number]  CHECK GLUCOSE SIX TIMES DAILY Dx E11.9 Susan Petty M, DO  Active   hydrochlorothiazide (MICROZIDE) 12.5 MG capsule [id number]  Take 1 capsule (12.5 mg total) by mouth daily. Susan Petty M, DO  Active   insulin degludec (TRESIBA FLEXTOUCH) 100 UNIT/ML FlexTouch Pen [id number]  Inject 20-30 Units into the skin daily.  Patient taking differently: Inject 22 Units into the skin daily.   Susan Petty M, DO  Active   Insulin Pen Needle  (NOVOFINE PLUS PEN NEEDLE) 32G X 4 MM MISC [id number]  UAD with insulin pen E11.9 Susan Norlander, DO  Active   LINZESS 290 MCG CAPS capsule [id number]  TAKE 1 CAPSULE BY MOUTH ONCE DAILY BEFORE BREAKFAST Carlis Stable, NP  Active   metFORMIN (GLUCOPHAGE) 500 MG tablet [id number]  Take 2 tablets (1,000 mg total) by mouth 2 (two) times daily with a meal. (NEEDS TO BE SEEN BEFORE NEXT REFILL) Susan Petty M, DO  Active   metoprolol tartrate (LOPRESSOR) 25 MG tablet [id number]  Take 1 tablet by mouth twice daily Gottschalk, Ashly M, DO  Active   nystatin (MYCOSTATIN/NYSTOP) powder [id number]  Apply 1 Application topically 3 (three) times daily. Sharion Balloon, FNP  Active   nystatin cream (MYCOSTATIN) [id number]  Apply 1 Application topically 2 (two) times daily. X7-10 days Sharion Balloon, FNP  Active   omeprazole (PRILOSEC) 40 MG capsule [id number]  Take 1 capsule by mouth twice daily Susan Petty M, DO  Active   ondansetron (ZOFRAN) 4 MG tablet [id number]  Take 1 tablet (4 mg total) by mouth every 8 (eight) hours as needed for nausea or vomiting. Evelina Dun A, FNP  Active   pravastatin (PRAVACHOL) 40 MG tablet [id number]  Take 1 tablet (40 mg total) by mouth daily. Susan Petty M, DO  Active   psyllium (METAMUCIL) 58.6 % powder [id number]  Take 1 packet by mouth daily. [provider]  Active   valACYclovir (VALTREX) 1000 MG tablet [id number]  Take 1 tablet by mouth twice daily Susan Norlander, DO  Active             Patient Active Problem List   Diagnosis Date Noted   Dysuria 08/15/2020   Age-related osteoporosis without current pathological fracture 12/11/2018   Constipation 06/19/2016   Allergic rhinitis 04/22/2016   Bowel  habit changes 03/20/2016   Bloating 03/20/2016   Body mass index (BMI) of 23.0-23.9 in adult 07/20/2015   GAD (generalized anxiety disorder) 03/14/2015   GERD (gastroesophageal reflux disease) 12/14/2012   Type 2 diabetes mellitus with  insulin therapy ([place]) 12/14/2012   Hyperlipidemia associated with type 2 diabetes mellitus ([place]) 12/14/2012   Hypertension associated with type 2 diabetes mellitus ([place]) 12/14/2012   S/P left THA, 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    Immunization History  Administered Date(s) Administered   Fluad Quad(high Dose 65+) 04/21/2019, 05/18/2020, 05/03/2021   Influenza, High Dose Seasonal PF 05/05/2013, 04/08/2017, 04/14/2018   Influenza,inj,Quad PF,6+ Mos 04/13/2014   Influenza-Unspecified 04/20/2012   Moderna Sars-Covid-2 Vaccination 09/13/2020, 11/07/2020, 03/28/2021   Pneumococcal Conjugate-13 03/14/2015   Pneumococcal Polysaccharide-23 04/14/2018   Td 10/18/1996   Tdap 05/16/2012    Conditions to be addressed/monitored: HLD, DMII, and CKD Stage 3a  Care Plan : PHARMD MEDICATION MANAGEMENT  Updates made by Susan Petty, RPH since 03/07/2022 12:00 AM     Problem: DISEASE PROGRESSION PREVENTION      Long-Range Goal: T2DM   This Visit's Progress: On track  Recent Progress: Not on track  Priority: High  Note:   Current Barriers:  Unable to independently monitor therapeutic efficacy Unable to maintain control of T2DM  Pharmacist Clinical Goal(s):  Over the next 90 days, patient will achieve adherence to monitoring guidelines and medication adherence to achieve therapeutic efficacy achieve control of T2DM as evidenced by GOAL A1C<7%, IMPROVED GLYCEMIC CONTROL, AVOID HYPOGLYCEMIA  through collaboration with PharmD and provider.    Interventions: 1:1 collaboration with Susan Norlander, DO regarding development and update of comprehensive plan of care as evidenced by provider attestation and co-signature Inter-disciplinary care team collaboration (see longitudinal plan of care) Comprehensive medication review performed; medication list updated in electronic medical record  Diabetes: Uncontrolled-A1C 7.4% with recurrent HYPOglycemia/falls (improved--only 1 episode of hypoglycemia);  current treatment: TRESIBA PEN 20-30 UNITS AT LUNCH TIME, METFORMIN Transition has been made to [place] 25 units at 9am Patient admits to continued use of Humulin Regular insulin 2-3 units with lunch daily -- discussed potentially giving rapid insulin for lunch time in the place of regular Blood sugar appears to be improving per patient report Dietary/habits are playing into patient's glycemic control (sweets, carbs, etc) Recommended higher protein/fiber options  Continue Tresiba to 25 units daily Continue metformin Will f/u with patient in 3 months Patient wearing Libre 2 CGM; education provided; sample has been provided due to sensor malfunctions Having CGM is making patient more aware of what she is eating and how she is taking insulin GFR 56, A1c 7.4% Current glucose readings: fasting glucose: <150, post prandial glucose: varies   Patient Goals/Self-Care Activities Over the next 90 days, patient will:  - take medications as prescribed check glucose daily, document, and provide at future appointments  Follow Up Plan: Telephone follow up appointment with care management team member scheduled for: Face to Face appointment with care management team member scheduled for:  3 months      Medication Assistance: None required.  Patient affirms current coverage meets needs.  Patient's preferred pharmacy is:  [facility] [address] [place] [address] [place] [address] Phone: [phone] Fax: [phone]  [id number] [id number]: [phone]: [phone]  Follow Up:  Patient agrees to Care Plan and Follow-up.  Plan: Telephone follow up  appointment with care management team member scheduled for:  3 months    Regina Eck, PharmD, BCPS Clinical Pharmacist, [place]  II Phone [phone]

## 2022-03-07 DIAGNOSIS — E119 Type 2 diabetes mellitus without complications: Secondary | ICD-10-CM

## 2022-03-07 DIAGNOSIS — E785 Hyperlipidemia, unspecified: Secondary | ICD-10-CM

## 2022-03-07 DIAGNOSIS — Z794 Long term (current) use of insulin: Secondary | ICD-10-CM

## 2022-03-07 DIAGNOSIS — E1169 Type 2 diabetes mellitus with other specified complication: Secondary | ICD-10-CM

## 2022-03-07 NOTE — Patient Instructions (Addendum)
Visit Information  Following are the goals we discussed today:  Current Barriers:  Unable to independently monitor therapeutic efficacy Unable to maintain control of T2DM  Pharmacist Clinical Goal(s):  Over the next 90 days, patient will achieve adherence to monitoring guidelines and medication adherence to achieve therapeutic efficacy achieve control of T2DM as evidenced by GOAL A1C<7%, IMPROVED GLYCEMIC CONTROL, AVOID HYPOGLYCEMIA through collaboration with PharmD and provider.    Interventions: 1:1 collaboration with Janora Norlander, DO regarding development and update of comprehensive plan of care as evidenced by provider attestation and co-signature Inter-disciplinary care team collaboration (see longitudinal plan of care) Comprehensive medication review performed; medication list updated in electronic medical record  Diabetes: Uncontrolled-A1C 7.4% with recurrent HYPOglycemia/falls (improved--only 1 episode of hypoglycemia); current treatment: TRESIBA PEN 20-30 UNITS AT LUNCH TIME, METFORMIN Transition has been made to [place] 25 units at 9am Patient admits to continued use of Humulin Regular insulin 2-3 units with lunch daily -- discussed potentially giving rapid insulin for lunch time in the place of regular Blood sugar appears to be improving per patient report Dietary/habits are playing into patient's glycemic control (sweets, carbs, etc) Recommended higher protein/fiber options  Continue Tresiba to 25 units daily Continue metformin Will f/u with patient in 3 months Patient wearing Libre 2 CGM; education provided; sample has been provided due to sensor malfunctions Having CGM is making patient more aware of what she is eating and how she is taking insulin GFR 56, A1c 7.4% Current glucose readings: fasting glucose: <150, post prandial glucose: varies   Patient Goals/Self-Care Activities Over the next 90 days, patient will:  - take medications as prescribed check  glucose daily, document, and provide at future appointments  Follow Up Plan: Telephone follow up appointment with care management team member scheduled for: Face to Face appointment with care management team member scheduled for:  3 months   Plan: Telephone follow up appointment with care management team member scheduled for:  3 months  Signature Regina Eck, PharmD, BCPS Clinical Pharmacist, [place]  II Phone [phone]   Please call the care guide team at [phone] if you need to cancel or reschedule your appointment.   The patient verbalized understanding of instructions, educational materials, and care plan provided today and DECLINED offer to receive copy of patient instructions, educational materials, and care plan.

## 2022-03-12 DIAGNOSIS — E119 Type 2 diabetes mellitus without complications: Secondary | ICD-10-CM | POA: Diagnosis not present

## 2022-03-21 ENCOUNTER — Other Ambulatory Visit: Payer: Self-pay | Admitting: Family Medicine

## 2022-03-21 ENCOUNTER — Encounter: Payer: Self-pay | Admitting: Family Medicine

## 2022-03-21 DIAGNOSIS — E1165 Type 2 diabetes mellitus with hyperglycemia: Secondary | ICD-10-CM

## 2022-03-21 NOTE — Telephone Encounter (Signed)
Letter sent.

## 2022-03-21 NOTE — Telephone Encounter (Signed)
Refill denied, 30 day supply was given 02/18/22.  Please schedule pt with PCP for further refills.

## 2022-03-28 ENCOUNTER — Telehealth: Payer: Self-pay | Admitting: Family Medicine

## 2022-03-29 NOTE — Telephone Encounter (Signed)
Please let patient know  If not scanning, sensor could have become dislodged from skin without her knowing (can't see under sticky adhesive) Also, if sensors fills with blood or hits a vessel---it will not read/work.  Multiple things can happen  Technology can fail [place] unfortunately!  Sample of [place] 2 CGM left up front for patient to pick up  If sensor fails in the future, always use traditional fingerstick/glucometer to test sugar.  Thank you! Almyra Free

## 2022-03-29 NOTE — Telephone Encounter (Signed)
Patient notified

## 2022-04-03 DIAGNOSIS — X32XXXD Exposure to sunlight, subsequent encounter: Secondary | ICD-10-CM | POA: Diagnosis not present

## 2022-04-03 DIAGNOSIS — L570 Actinic keratosis: Secondary | ICD-10-CM | POA: Diagnosis not present

## 2022-04-12 DIAGNOSIS — E119 Type 2 diabetes mellitus without complications: Secondary | ICD-10-CM | POA: Diagnosis not present

## 2022-04-26 ENCOUNTER — Ambulatory Visit (INDEPENDENT_AMBULATORY_CARE_PROVIDER_SITE_OTHER): Payer: Medicare Other

## 2022-04-26 ENCOUNTER — Other Ambulatory Visit: Payer: Self-pay | Admitting: Family Medicine

## 2022-04-26 DIAGNOSIS — E1159 Type 2 diabetes mellitus with other circulatory complications: Secondary | ICD-10-CM

## 2022-04-26 DIAGNOSIS — Z0000 Encounter for general adult medical examination without abnormal findings: Secondary | ICD-10-CM

## 2022-04-26 NOTE — Progress Notes (Signed)
Subjective:   Susan Petty is a 76 y.o. female who presents for Medicare Annual (Subsequent) preventive examination.  I connected with  [name] on 04/26/22 by a audio enabled telemedicine application and verified that I am speaking with the correct person using two identifiers.  Patient Location: Home  Provider Location: Office/Clinic  I discussed the limitations of evaluation and management by telemedicine. The patient expressed understanding and agreed to proceed.     Review of Systems     Cardiac Risk Factors include: advanced age (>63mn, >>18women);diabetes mellitus;dyslipidemia;hypertension;sedentary lifestyle     Objective:    Today's Vitals   04/26/22 0957  PainSc: 6    Unable to obtain vitals     04/26/2022   10:23 AM 04/25/2021   10:09 AM 04/19/2020    9:43 AM 04/19/2019    9:52 AM 04/14/2018   10:37 AM 03/29/2016    9:25 AM 09/03/2011    6:36 PM  Advanced Directives  Does Patient Have a Medical Advance Directive? No No No No No No Patient does not have advance directive;Patient would not like information  Would patient like information on creating a medical advance directive? No - Patient declined No - Patient declined No - Patient declined No - Patient declined Yes (MAU/Ambulatory/Procedural Areas - Information given) No - patient declined information   Pre-existing out of facility DNR order (yellow form or pink MOST form)       No    Current Medications (verified) Outpatient Encounter Medications as of 04/26/2022  Medication Sig   Accu-Chek Softclix Lancets lancets SMARTSIG:Topical 6 Times Daily   amLODipine (NORVASC) 10 MG tablet Take 1 tablet (10 mg total) by mouth daily.   aspirin EC 81 MG tablet Take 81 mg by mouth daily.   benazepril (LOTENSIN) 40 MG tablet Take 1 tablet (40 mg total) by mouth daily.   blood glucose meter kit and supplies Dispense based on patient and insurance preference. Use up to four times daily as directed. (FOR  ICD-10 E10.9, E11.9).   Blood Glucose Monitoring Suppl (ACCU-CHEK GUIDE) w/Device KIT CHECK GLUCOSE SIX TIMES DAILY Dx E11.9   Cholecalciferol (VITAMIN D-3 PO) Take 1,000 Units by mouth in the morning and at bedtime.   Continuous Blood Gluc Sensor (FREESTYLE LIBRE 2 SENSOR) MISC by Does not apply route.   FLUoxetine (PROZAC) 10 MG capsule Take 1 capsule (10 mg total) by mouth daily.   glucose blood (ACCU-CHEK GUIDE) test strip CHECK GLUCOSE SIX TIMES DAILY Dx E11.9   hydrochlorothiazide (MICROZIDE) 12.5 MG capsule Take 1 capsule (12.5 mg total) by mouth daily.   insulin degludec (TRESIBA FLEXTOUCH) 100 UNIT/ML FlexTouch Pen Inject 20-30 Units into the skin daily. (Patient taking differently: Inject 22 Units into the skin daily.)   Insulin Pen Needle (NOVOFINE PLUS PEN NEEDLE) 32G X 4 MM MISC UAD with insulin pen E11.9   insulin regular (NOVOLIN R) 100 units/mL injection Inject 3 Units into the skin daily with lunch.   metFORMIN (GLUCOPHAGE) 500 MG tablet Take 2 tablets (1,000 mg total) by mouth 2 (two) times daily with a meal. (NEEDS TO BE SEEN BEFORE NEXT REFILL)   metoprolol tartrate (LOPRESSOR) 25 MG tablet Take 1 tablet by mouth twice daily   nystatin (MYCOSTATIN/NYSTOP) powder Apply 1 Application topically 3 (three) times daily.   nystatin cream (MYCOSTATIN) Apply 1 Application topically 2 (two) times daily. X7-10 days   omeprazole (PRILOSEC) 40 MG capsule Take 1 capsule by mouth twice daily   pravastatin (PRAVACHOL)  40 MG tablet Take 1 tablet (40 mg total) by mouth daily.   psyllium (METAMUCIL) 58.6 % powder Take 1 packet by mouth daily.   valACYclovir (VALTREX) 1000 MG tablet Take 1 tablet by mouth twice daily   alendronate (FOSAMAX) 70 MG tablet TAKE 1 TABLET BY MOUTH ONCE A WEEK WITH  A  FULL  GLASS  OF  WATER  ON  AN  EMPTY  STOMACH (Patient not taking: Reported on 04/26/2022)   carbamide peroxide (DEBROX) 6.5 % OTIC solution Place 5 drops into both ears 2 (two) times daily. (Patient not  taking: Reported on 04/26/2022)   cetirizine (ZYRTEC) 5 MG tablet Take 1 tablet by mouth once daily (Patient not taking: Reported on 04/26/2022)   fluticasone (FLONASE) 50 MCG/ACT nasal spray Place 2 sprays into both nostrils daily. (Patient not taking: Reported on 04/26/2022)   LINZESS 290 MCG CAPS capsule TAKE 1 CAPSULE BY MOUTH ONCE DAILY BEFORE BREAKFAST (Patient not taking: Reported on 04/26/2022)   ondansetron (ZOFRAN) 4 MG tablet Take 1 tablet (4 mg total) by mouth every 8 (eight) hours as needed for nausea or vomiting. (Patient not taking: Reported on 04/26/2022)   No facility-administered encounter medications on file as of 04/26/2022.    Allergies (verified) Sulfa antibiotics and Sulfa drugs cross reactors   History: Past Medical History:  Diagnosis Date   Diabetes mellitus    GERD (gastroesophageal reflux disease)    Hyperlipidemia    Hypertension    Osteoporosis    Past Surgical History:  Procedure Laterality Date   COLONOSCOPY N/A 03/29/2016   Dr. Oneida Alar: Redundant left colon, nonbleeding internal hemorrhoids.  Consider colonoscopy in 10 to 15 years if benefits outweigh the risk    ESOPHAGOGASTRODUODENOSCOPY N/A 03/29/2016   Dr. Oneida Alar: Small hiatal hernia, small sessile polyps found in the gastric fundus, benign fundic gland polyps, mild inflammation and erythema in the gastric antrum.  Duodenum was normal.  Biopsy negative for celiac.  Schatzki ring, patent.   TOTAL HIP ARTHROPLASTY  09/03/2011   Procedure: TOTAL HIP ARTHROPLASTY;  Surgeon: Mauri Pole, MD;  Location: [facility];  Service: Orthopedics;  Laterality: Left;   Family History  Problem Relation Age of Onset   Hypertension Mother    Hypertension Father    Diabetes Father    Hypertension Sister    Cancer Sister    Diabetes Brother    Diabetes Brother    Colon cancer Neg Hx    Social History   Socioeconomic History   Marital status: Widowed    Spouse name: Not on file   Number of children: 2   Years  of education: 12   Highest education level: High school graduate  Occupational History   Occupation: retired  Tobacco Use   Smoking status: Never   Smokeless tobacco: Never  Vaping Use   Vaping Use: Never used  Substance and Sexual Activity   Alcohol use: Yes    Comment: occasional wine   Drug use: No   Sexual activity: Not Currently    Birth control/protection: None  Other Topics Concern   Not on file  Social History Narrative   Lives alone. Daughters visit frequently   Social Determinants of Health   Financial Resource Strain: Low Risk  (04/26/2022)   Overall Financial Resource Strain (CARDIA)    Difficulty of Paying Living Expenses: Not hard at all  Food Insecurity: No Food Insecurity (04/26/2022)   Hunger Vital Sign    Worried About Running Out of Food in the  Last Year: Never true    Fifty-Six in the Last Year: Never true  Transportation Needs: No Transportation Needs (04/26/2022)   PRAPARE - Hydrologist (Medical): No    Lack of Transportation (Non-Medical): No  Physical Activity: Insufficiently Active (04/26/2022)   Exercise Vital Sign    Days of Exercise per Week: 2 days    Minutes of Exercise per Session: 60 min  Stress: No Stress Concern Present (04/26/2022)   [place]    Feeling of Stress : Not at all  Social Connections: Socially Isolated (04/26/2022)   Social Connection and Isolation Panel [NHANES]    Frequency of Communication with Friends and Family: More than three times a week    Frequency of Social Gatherings with Friends and Family: Three times a week    Attends Religious Services: Never    Active Member of Clubs or Organizations: No    Attends Archivist Meetings: Never    Marital Status: Widowed    Tobacco Counseling Counseling given: Not Answered   Clinical Intake:  Pre-visit preparation completed: Yes  Pain : 0-10 Pain Score:  6  Pain Type: Chronic pain Pain Location: Knee Pain Orientation: Left Pain Descriptors / Indicators: Tightness Pain Onset: More than a month ago Pain Frequency: Constant Pain Relieving Factors: Rest, Tylenol, elevation and ice, knee brace  Pain Relieving Factors: Rest, Tylenol, elevation and ice, knee brace  Diabetes: Yes CBG done?: No Did pt. bring in CBG monitor from home?: No  How often do you need to have someone help you when you read instructions, pamphlets, or other written materials from your doctor or pharmacy?: 1 - Never What is the last grade level you completed in school?: 12th grade  Diabetic? Yes  Nutrition Risk Assessment:  Has the patient had any N/V/D within the last 2 months?  No  Does the patient have any non-healing wounds?  No  Has the patient had any unintentional weight loss or weight gain?  No   Diabetes:  Is the patient diabetic?  Yes  If diabetic, was a CBG obtained today?  No  Did the patient bring in their glucometer from home?  No  How often do you monitor your CBG's? daily.   Financial Strains and Diabetes Management:  Are you having any financial strains with the device, your supplies or your medication? No .  Does the patient want to be seen by Chronic Care Management for management of their diabetes?  No  Would the patient like to be referred to a Nutritionist or for Diabetic Management?  No   Diabetic Exams:  Diabetic Eye Exam: Patient had eye exam on within the last year, results pending. Diabetic Foot Exam: Overdue, Pt has been advised about the importance in completing this exam. Pt is scheduled for diabetic foot exam on 06/05/2022.   Interpreter Needed?: No  Information entered by :: Felicity Coyer LPN   Activities of Daily Living    04/26/2022   10:23 AM  In your present state of health, do you have any difficulty performing the following activities:  Hearing? 0  Vision? 0  Difficulty concentrating or making decisions? 0   Walking or climbing stairs? 1  Comment knee pain  Dressing or bathing? 0  Doing errands, shopping? 0  Preparing Food and eating ? N  Using the Toilet? N  In the past six months, have you accidently leaked urine? N  Do you have problems with loss of bowel control? N  Managing your Medications? N  Managing your Finances? N  Housekeeping or managing your Housekeeping? N    Patient Care Team: Janora Norlander, DO as PCP - General (Family Medicine) Danie Binder, MD (Inactive) as Consulting Physician (Gastroenterology) Lavera Guise, [facility] (Pharmacist) Eloise Harman, DO as Consulting Physician (Internal Medicine)  Indicate any recent Medical Services you may have received from other than [facility] providers in the past year (date may be approximate).     Assessment:   This is a routine wellness examination for Sharnika.  Hearing/Vision screen No results found.  Dietary issues and exercise activities discussed: Current Exercise Habits: Home exercise routine, Type of exercise: walking, Time (Minutes): 60, Frequency (Times/Week): 2, Weekly Exercise (Minutes/Week): 120, Intensity: Mild, Exercise limited by: orthopedic condition(s)   Goals Addressed             This Visit's Progress    Patient Stated       04/26/2022 AWV Goal: Improved Nutrition/Diet  Patient will verbalize understanding that diet plays an important role in overall health and that a poor diet is a risk factor for many chronic medical conditions.  Over the next year, patient will improve self management of their diet by incorporating increased physical activity. Patient will utilize available community resources to help with food acquisition if needed (ex: food pantries, [address], etc) Patient will work with nutrition specialist if a referral was made        Depression Screen    04/26/2022   10:15 AM 01/25/2022    8:11 AM 11/07/2021   10:28 AM 07/26/2021    8:09 AM 04/25/2021    9:57 AM 03/06/2021     9:58 AM 08/28/2020    1:17 PM  PHQ 2/9 Scores  PHQ - 2 Score 0 2 0 0 '2 2 1  ' PHQ- 9 Score  '8  1 5 5 3    ' Fall Risk    04/26/2022   10:29 AM 01/25/2022    8:11 AM 11/07/2021   10:27 AM 04/25/2021   10:08 AM 03/06/2021    9:58 AM  Fall Risk   Falls in the past year? 1 0 0 0 0  Number falls in past yr: 0 0  0   Injury with Fall? 0 0  0   Risk for fall due to :    Orthopedic patient   Follow up Falls evaluation completed Falls evaluation completed  Falls prevention discussed     [place]:  Any stairs in or around the home? Yes  If so, are there any without handrails? Yes  Home free of loose throw rugs in walkways, pet beds, electrical cords, etc? Yes  Adequate lighting in your home to reduce risk of falls? Yes   ASSISTIVE DEVICES UTILIZED TO PREVENT FALLS:  Life alert? No  Use of a cane, walker or w/c? No  Grab bars in the bathroom? No  Shower chair or bench in shower? Yes  Elevated toilet seat or a handicapped toilet? Yes    Cognitive Function:    04/14/2018   10:38 AM  MMSE - Mini Mental State Exam  Orientation to time 5  Orientation to Place 5  Registration 3  Attention/ Calculation 5  Recall 2  Language- name 2 objects 2  Language- repeat 1  Language- follow 3 step command 3  Language- read & follow direction 1  Write a sentence  1  Copy design 1  Total score 29        04/26/2022   10:25 AM 04/19/2020    9:46 AM 04/19/2019    9:58 AM  6CIT Screen  What Year? 0 points 0 points 0 points  What month? 0 points 0 points 0 points  What time? 0 points 0 points 0 points  Count back from 20 0 points 0 points 0 points  Months in reverse 2 points 0 points 0 points  Repeat phrase 0 points 0 points 0 points  Total Score 2 points 0 points 0 points    Immunizations Immunization History  Administered Date(s) Administered   Fluad Quad(high Dose 65+) 04/21/2019, 05/18/2020, 05/03/2021   Influenza, High Dose Seasonal PF 05/05/2013,  04/08/2017, 04/14/2018   Influenza,inj,Quad PF,6+ Mos 04/13/2014   Influenza-Unspecified 04/20/2012   Moderna Sars-Covid-2 Vaccination 09/13/2020, 11/07/2020, 03/28/2021   Pneumococcal Conjugate-13 03/14/2015   Pneumococcal Polysaccharide-23 04/14/2018   Td 10/18/1996   Tdap 05/16/2012    TDAP status: Due, Education has been provided regarding the importance of this vaccine. Advised may receive this vaccine at local pharmacy or Health Dept. Aware to provide a copy of the vaccination record if obtained from local pharmacy or Health Dept. Verbalized acceptance and understanding.  Flu Vaccine status: Due, Education has been provided regarding the importance of this vaccine. Advised may receive this vaccine at local pharmacy or Health Dept. Aware to provide a copy of the vaccination record if obtained from local pharmacy or Health Dept. Verbalized acceptance and understanding.  Pneumococcal vaccine status: Due, Education has been provided regarding the importance of this vaccine. Advised may receive this vaccine at local pharmacy or Health Dept. Aware to provide a copy of the vaccination record if obtained from local pharmacy or Health Dept. Verbalized acceptance and understanding.  Covid-19 vaccine status: Declined, Education has been provided regarding the importance of this vaccine but patient still declined. Advised may receive this vaccine at local pharmacy or Health Dept.or vaccine clinic. Aware to provide a copy of the vaccination record if obtained from local pharmacy or Health Dept. Verbalized acceptance and understanding.  Qualifies for Shingles Vaccine? Yes   Zostavax completed No   Shingrix Completed?: No.    Education has been provided regarding the importance of this vaccine. Patient has been advised to call insurance company to determine out of pocket expense if they have not yet received this vaccine. Advised may also receive vaccine at local pharmacy or Health Dept. Verbalized  acceptance and understanding.  Screening Tests Health Maintenance  Topic Date Due   OPHTHALMOLOGY EXAM  08/19/2015   Diabetic kidney evaluation - Urine ACR  08/19/2020   FOOT EXAM  05/18/2021   COVID-19 Vaccine (4 - Moderna series) 05/23/2021   HEMOGLOBIN A1C  01/23/2022   INFLUENZA VACCINE  02/05/2022   Zoster Vaccines- Shingrix (1 of 2) 04/27/2022 (Originally [birth year]/1993)   MAMMOGRAM  01/26/2023 (Originally 07/05/2021)   TETANUS/TDAP  05/16/2022   DEXA SCAN  05/18/2022   Diabetic kidney evaluation - GFR measurement  01/26/2023   Pneumonia Vaccine 43+ Years old  Completed   Hepatitis C Screening  Completed   HPV VACCINES  Aged Out   PAP SMEAR-Modifier  Discontinued   COLONOSCOPY (Pts 45-23yr Insurance coverage will need to be confirmed)  Discontinued    Health Maintenance  Health Maintenance Due  Topic Date Due   OPHTHALMOLOGY EXAM  08/19/2015   Diabetic kidney evaluation - Urine ACR  08/19/2020   FOOT EXAM  05/18/2021  COVID-19 Vaccine (4 - Moderna series) 05/23/2021   HEMOGLOBIN A1C  01/23/2022   INFLUENZA VACCINE  02/05/2022    Colorectal cancer screening: No longer required.   Mammogram status: No longer required due to age.  Dexa screening:  Patient is due    Lung Cancer Screening: (Low Dose CT Chest recommended if Age 55-80 years, 30 pack-year currently smoking OR have quit w/in 15years.) does not qualify.   Lung Cancer Screening Referral: N/A   Additional Screening:  Hepatitis C Screening: does not qualify  Vision Screening: Recommended annual ophthalmology exams for early detection of glaucoma and other disorders of the eye. Is the patient up to date with their annual eye exam?  No  Who is the provider or what is the name of the office in which the patient attends annual eye exams? [facility] If pt is not established with a provider, would they like to be referred to a provider to establish care?  N/A .   Dental Screening: Recommended annual  dental exams for proper oral hygiene  Community Resource Referral / Chronic Care Management: CRR required this visit?  No   CCM required this visit?  No      Plan:     I have personally reviewed and noted the following in the patient's chart:   Medical and social history Use of alcohol, tobacco or illicit drugs  Current medications and supplements including opioid prescriptions. Patient is not currently taking opioid prescriptions. Functional ability and status Nutritional status Physical activity Advanced directives List of other physicians Hospitalizations, surgeries, and ER visits in previous 12 months Vitals Screenings to include cognitive, depression, and falls Referrals and appointments  In addition, I have reviewed and discussed with patient certain preventive protocols, quality metrics, and best practice recommendations. A written personalized care plan for preventive services as well as general preventive health recommendations were provided to patient.     Felicity Coyer LPN     [id number]   Nurse Notes: patient is due for diabetic follow up and appointment was scheduled on 06/05/2022.    After visit summary declined

## 2022-06-05 ENCOUNTER — Other Ambulatory Visit: Payer: Self-pay | Admitting: Family Medicine

## 2022-06-05 ENCOUNTER — Ambulatory Visit (INDEPENDENT_AMBULATORY_CARE_PROVIDER_SITE_OTHER): Payer: Medicare Other | Admitting: Family Medicine

## 2022-06-05 ENCOUNTER — Encounter: Payer: Self-pay | Admitting: Family Medicine

## 2022-06-05 VITALS — BP 147/61 | HR 62 | Temp 97.6°F | Ht 64.0 in | Wt 150.8 lb

## 2022-06-05 DIAGNOSIS — I152 Hypertension secondary to endocrine disorders: Secondary | ICD-10-CM | POA: Diagnosis not present

## 2022-06-05 DIAGNOSIS — M810 Age-related osteoporosis without current pathological fracture: Secondary | ICD-10-CM

## 2022-06-05 DIAGNOSIS — E119 Type 2 diabetes mellitus without complications: Secondary | ICD-10-CM

## 2022-06-05 DIAGNOSIS — Z794 Long term (current) use of insulin: Secondary | ICD-10-CM | POA: Diagnosis not present

## 2022-06-05 DIAGNOSIS — R790 Abnormal level of blood mineral: Secondary | ICD-10-CM | POA: Diagnosis not present

## 2022-06-05 DIAGNOSIS — E785 Hyperlipidemia, unspecified: Secondary | ICD-10-CM | POA: Diagnosis not present

## 2022-06-05 DIAGNOSIS — R252 Cramp and spasm: Secondary | ICD-10-CM | POA: Diagnosis not present

## 2022-06-05 DIAGNOSIS — E1159 Type 2 diabetes mellitus with other circulatory complications: Secondary | ICD-10-CM

## 2022-06-05 DIAGNOSIS — E1169 Type 2 diabetes mellitus with other specified complication: Secondary | ICD-10-CM | POA: Diagnosis not present

## 2022-06-05 DIAGNOSIS — Z23 Encounter for immunization: Secondary | ICD-10-CM

## 2022-06-05 LAB — BAYER DCA HB A1C WAIVED: HB A1C (BAYER DCA - WAIVED): 7.9 % — ABNORMAL HIGH (ref 4.8–5.6)

## 2022-06-05 MED ORDER — TRESIBA FLEXTOUCH 100 UNIT/ML ~~LOC~~ SOPN: 20.0000 [IU] | PEN_INJECTOR | Freq: Every day | SUBCUTANEOUS | 3 refills | Status: DC

## 2022-06-05 MED ORDER — FREESTYLE LIBRE 2 SENSOR MISC: 4 refills | Status: DC

## 2022-06-05 MED ORDER — METOPROLOL TARTRATE 25 MG PO TABS: 25.0000 mg | ORAL_TABLET | Freq: Two times a day (BID) | ORAL | 3 refills | Status: DC

## 2022-06-05 MED ORDER — AMLODIPINE BESYLATE 10 MG PO TABS: 10.0000 mg | ORAL_TABLET | Freq: Every day | ORAL | 3 refills | Status: DC

## 2022-06-05 MED ORDER — METFORMIN HCL 500 MG PO TABS: 1000.0000 mg | ORAL_TABLET | Freq: Two times a day (BID) | ORAL | 3 refills | Status: AC

## 2022-06-05 MED ORDER — FLUOXETINE HCL 10 MG PO CAPS: 10.0000 mg | ORAL_CAPSULE | Freq: Every day | ORAL | 3 refills | Status: DC

## 2022-06-05 MED ORDER — OMEPRAZOLE 40 MG PO CPDR: 40.0000 mg | DELAYED_RELEASE_CAPSULE | Freq: Two times a day (BID) | ORAL | 3 refills | Status: DC

## 2022-06-05 MED ORDER — HYDROCHLOROTHIAZIDE 12.5 MG PO CAPS: 12.5000 mg | ORAL_CAPSULE | Freq: Every day | ORAL | 3 refills | Status: DC

## 2022-06-05 MED ORDER — BENAZEPRIL HCL 40 MG PO TABS: 40.0000 mg | ORAL_TABLET | Freq: Every day | ORAL | 3 refills | Status: DC

## 2022-06-05 MED ORDER — ALENDRONATE SODIUM 70 MG PO TABS: ORAL_TABLET | ORAL | 3 refills | Status: DC

## 2022-06-05 MED ORDER — INSULIN REGULAR HUMAN 100 UNIT/ML IJ SOLN: INTRAMUSCULAR | 99 refills | Status: DC

## 2022-06-05 MED ORDER — PRAVASTATIN SODIUM 40 MG PO TABS: 40.0000 mg | ORAL_TABLET | Freq: Every day | ORAL | 3 refills | Status: DC

## 2022-06-05 NOTE — Progress Notes (Signed)
I have separately seen and examined the patient. I have discussed the findings and exam with student Dr Susan Petty and agree with the below note.  My changes/additions are outlined in BLUE.    S: Patient reports that overall she is been doing relatively well.  She still sees pretty wide fluctuations in her blood sugars where sometimes she will have hypoglycemia down into the 50s but other times her sugars will be in the 300s.  Utilizing the [place] several times per day to check her sugars.  Stress certainly increases her sugar levels.  She typically injects 20 units of Tresiba daily and 3 to 4 units of Novolin R with breakfast and supper.  Occasionally she will Susan Petty a sliding scale insulin but pretty much just guesses how much she may need.  She does not actually have a sliding scale at home to use her reference.  Compliant with metformin 1000 mg twice daily, Norvasc 10 mg daily, benazepril 40 mg daily and Pravachol 40 mg daily.  O: Vitals:   06/05/22 1050 06/05/22 1133  BP: (!) 154/68 (!) 147/61  Pulse: 62   Temp: 97.6 F (36.4 C)   SpO2: 96%     General appearance: alert, cooperative, appears stated age, and no distress Eyes: conjunctivae/corneas clear.  Lungs: clear to auscultation bilaterally Heart: regular rate and rhythm, S1, S2 normal, no murmur, click, rub or gallop Extremities: extremities normal, atraumatic, no cyanosis or edema   Diabetic Foot Exam - Simple   Simple Foot Form Diabetic Foot exam was performed with the following findings: Yes 06/05/2022 12:44 PM  Visual Inspection No deformities, no ulcerations, no other skin breakdown bilaterally: Yes Sensation Testing Intact to touch and monofilament testing bilaterally: Yes Pulse Check Posterior Tibialis and Dorsalis pulse intact bilaterally: Yes Comments     A/P:  Type 2 diabetes mellitus with insulin therapy ([place]) - Plan: Microalbumin / creatinine urine ratio, Bayer DCA Hb A1c Waived, insulin degludec (TRESIBA FLEXTOUCH)  100 UNIT/ML FlexTouch Pen, metFORMIN (GLUCOPHAGE) 500 MG tablet, insulin regular (NOVOLIN R) 100 units/mL injection, Continuous Blood Gluc Sensor (FREESTYLE LIBRE 2 SENSOR) MISC  Hypertension associated with type 2 diabetes mellitus ([place]) - Plan: amLODipine (NORVASC) 10 MG tablet, benazepril (LOTENSIN) 40 MG tablet, hydrochlorothiazide (MICROZIDE) 12.5 MG capsule, metoprolol tartrate (LOPRESSOR) 25 MG tablet  Hyperlipidemia associated with type 2 diabetes mellitus ([place]) - Plan: pravastatin (PRAVACHOL) 40 MG tablet, Lipid Panel, CMP14+EGFR  Age-related osteoporosis without current pathological fracture - Plan: alendronate (FOSAMAX) 70 MG tablet, DG [facility] DEXA, VITAMIN D 25 Hydroxy (Vit-D Deficiency, Fractures)  Leg cramp - Plan: Magnesium  Need for immunization against influenza - Plan: Flu Vaccine QUAD High Dose(Fluad)  Sugar not at goal with A1c rising to 7.9.  Ideally I would like her to have closer to 7 A1c but understand that she has quite labile blood sugars.  I given her a very sensitive sliding scale today to reference.  Would like to reassess her in the next couple of months, sooner if concerns arise.  Increase of physical activity to reduce sugar naturally.  All medications have been renewed including her Susan Petty  She wants to defer the DEXA scan to next visit but I did renew her alendronate  Check magnesium given reports of leg cramping and influenza vaccination administered  Susan Petty M. Susan Petty, Susan Petty   -------------------------------------------------------------------------------------------------------------------------------------------------------------------------------------      Subjective: CC: Hypertension, diabetes PCP: Susan Petty, Susan Petty UEK:[id number] Susan Petty is a 76 y.o. female presenting to clinic today for:  Hypertension  Hyperlipidemia  Type 2 Diabets Patient reports that overall things are going well. Strong social support  through her family. She feels anxious about low blood sugars and may overcompensate with high-carb food choices. She has also been more sedentary lately, as she is now retired. She is frustrated that her weight is up. She checks her blood sugars regularly using the [place]. Her blood sugars are as high as 300s, low of 50s, average of 183. Patient notes that she does have a treadmill at home. Patient also checks blood pressures at home and reports that the systolic pressures range from 120s to 140s. Patient reports some cramping in her legs when she wakes up in the morning. Patient denies chest Petty, shortness of breath, food regurgitation, indigestion, lightheadedness, and muscle cramps.   ROS: Per HPI  Allergies  Allergen Reactions   Sulfa Antibiotics    Sulfa Drugs Cross Reactors Rash   Past Medical History:  Diagnosis Date   Diabetes mellitus    GERD (gastroesophageal reflux disease)    Hyperlipidemia    Hypertension    Osteoporosis     Current Outpatient Medications:    Accu-Chek Softclix Lancets lancets, SMARTSIG:Topical 6 Times Daily, Disp: , Rfl:    aspirin EC 81 MG tablet, Take 81 mg by mouth daily., Disp: , Rfl:    blood glucose meter kit and supplies, Dispense based on patient and insurance preference. Use up to four times daily as directed. (FOR ICD-10 E10.9, E11.9)., Disp: 1 each, Rfl: 0   Blood Glucose Monitoring Suppl (ACCU-CHEK GUIDE) w/Device KIT, CHECK GLUCOSE SIX TIMES DAILY Dx E11.9, Disp: 1 kit, Rfl: 0   Cholecalciferol (VITAMIN D-3 PO), Take 1,000 Units by mouth in the morning and at bedtime., Disp: , Rfl:    glucose blood (ACCU-CHEK GUIDE) test strip, CHECK GLUCOSE SIX TIMES DAILY Dx E11.9, Disp: 600 each, Rfl: 3   Insulin Pen Needle (NOVOFINE PLUS PEN NEEDLE) 32G X 4 MM MISC, UAD with insulin pen E11.9, Disp: 100 each, Rfl: 3   nystatin (MYCOSTATIN/NYSTOP) powder, Apply 1 Application topically 3 (three) times daily., Disp: 60 g, Rfl: 1   nystatin cream (MYCOSTATIN),  Apply 1 Application topically 2 (two) times daily. X7-10 days, Disp: 30 g, Rfl: 0   psyllium (METAMUCIL) 58.6 % powder, Take 1 packet by mouth daily., Disp: , Rfl:    valACYclovir (VALTREX) 1000 MG tablet, Take 1 tablet by mouth twice daily, Disp: 20 tablet, Rfl: 0   alendronate (FOSAMAX) 70 MG tablet, TAKE 1 TABLET BY MOUTH ONCE A WEEK WITH  A  FULL  GLASS  OF  WATER  ON  AN  EMPTY  STOMACH, Disp: 12 tablet, Rfl: 3   amLODipine (NORVASC) 10 MG tablet, Take 1 tablet (10 mg total) by mouth daily., Disp: 90 tablet, Rfl: 3   benazepril (LOTENSIN) 40 MG tablet, Take 1 tablet (40 mg total) by mouth daily., Disp: 90 tablet, Rfl: 3   carbamide peroxide (DEBROX) 6.5 % OTIC solution, Place 5 drops into both ears 2 (two) times daily. (Patient not taking: Reported on 04/26/2022), Disp: 15 mL, Rfl: 0   cetirizine (ZYRTEC) 5 MG tablet, Take 1 tablet by mouth once daily (Patient not taking: Reported on 04/26/2022), Disp: 30 tablet, Rfl: 0   Continuous Blood Gluc Sensor (FREESTYLE LIBRE 2 SENSOR) MISC, UAD to check sugars at least 4 times daily. E11.9, Disp: 6 each, Rfl: 4   FLUoxetine (PROZAC) 10 MG capsule, Take 1 capsule (10 mg total) by mouth daily., Disp:  90 capsule, Rfl: 3   fluticasone (FLONASE) 50 MCG/ACT nasal spray, Place 2 sprays into both nostrils daily. (Patient not taking: Reported on 04/26/2022), Disp: 16 g, Rfl: 6   hydrochlorothiazide (MICROZIDE) 12.5 MG capsule, Take 1 capsule (12.5 mg total) by mouth daily., Disp: 90 capsule, Rfl: 3   insulin degludec (TRESIBA FLEXTOUCH) 100 UNIT/ML FlexTouch Pen, Inject 20-30 Units into the skin daily., Disp: 30 mL, Rfl: 3   insulin regular (NOVOLIN R) 100 units/mL injection, Use per sliding scale as directed., Disp: 10 mL, Rfl: PRN   LINZESS 290 MCG CAPS capsule, TAKE 1 CAPSULE BY MOUTH ONCE DAILY BEFORE BREAKFAST (Patient not taking: Reported on 04/26/2022), Disp: 30 capsule, Rfl: 11   metFORMIN (GLUCOPHAGE) 500 MG tablet, Take 2 tablets (1,000 mg total) by  mouth 2 (two) times daily with a meal., Disp: 360 tablet, Rfl: 3   metoprolol tartrate (LOPRESSOR) 25 MG tablet, Take 1 tablet (25 mg total) by mouth 2 (two) times daily., Disp: 180 tablet, Rfl: 3   omeprazole (PRILOSEC) 40 MG capsule, Take 1 capsule (40 mg total) by mouth 2 (two) times daily., Disp: 180 capsule, Rfl: 3   ondansetron (ZOFRAN) 4 MG tablet, Take 1 tablet (4 mg total) by mouth every 8 (eight) hours as needed for nausea or vomiting. (Patient not taking: Reported on 04/26/2022), Disp: 20 tablet, Rfl: 0   pravastatin (PRAVACHOL) 40 MG tablet, Take 1 tablet (40 mg total) by mouth daily., Disp: 90 tablet, Rfl: 3 Social History   Socioeconomic History   Marital status: Widowed    Spouse name: Not on file   Number of children: 2   Years of education: 12   Highest education level: High school graduate  Occupational History   Occupation: retired  Tobacco Use   Smoking status: Never   Smokeless tobacco: Never  Vaping Use   Vaping Use: Never used  Substance and Sexual Activity   Alcohol use: Yes    Comment: occasional wine   Drug use: No   Sexual activity: Not Currently    Birth control/protection: None  Other Topics Concern   Not on file  Social History Narrative   Lives alone. Daughters visit frequently   Social Determinants of Health   Financial Resource Strain: Low Risk  (04/26/2022)   Overall Financial Resource Strain (CARDIA)    Difficulty of Paying Living Expenses: Not hard at all  Food Insecurity: No Food Insecurity (04/26/2022)   Hunger Vital Sign    Worried About Running Out of Food in the Last Year: Never true    Ran Out of Food in the Last Year: Never true  Transportation Needs: No Transportation Needs (04/26/2022)   PRAPARE - Hydrologist (Medical): No    Lack of Transportation (Non-Medical): No  Physical Activity: Insufficiently Active (04/26/2022)   Exercise Vital Sign    Days of Exercise per Week: 2 days    Minutes of  Exercise per Session: 60 min  Stress: No Stress Concern Present (04/26/2022)   [place]    Feeling of Stress : Not at all  Social Connections: Socially Isolated (04/26/2022)   Social Connection and Isolation Panel [NHANES]    Frequency of Communication with Friends and Family: More than three times a week    Frequency of Social Gatherings with Friends and Family: Three times a week    Attends Religious Services: Never    Active Member of Clubs or Organizations: No  Attends Archivist Meetings: Never    Marital Status: Widowed  Intimate Partner Violence: Not At Risk (04/26/2022)   Humiliation, Afraid, Rape, and Kick questionnaire    Fear of Current or Ex-Partner: No    Emotionally Abused: No    Physically Abused: No    Sexually Abused: No   Family History  Problem Relation Age of Onset   Hypertension Mother    Hypertension Father    Diabetes Father    Hypertension Sister    Cancer Sister    Diabetes Brother    Diabetes Brother    Colon cancer Neg Hx     Objective: Office vital signs reviewed. BP (!) 147/61   Pulse 62   Temp 97.6 F (36.4 C)   Ht _0  (1.626 m)   Wt 150 lb 12.8 oz (68.4 kg)   SpO2 96%   BMI 25.88 kg/m   Physical Exam General: Awake, alert, well nourished, No acute distress Cardio: regular rate and rhythm, S1S2 heard, no murmurs appreciated Pulm: clear to auscultation bilaterally, no wheezes, rhonchi or rales; normal work of breathing on room air Extremities: warm, well perfused, No edema, cyanosis or clubbing; +1 pulses bilaterally Skin: dry; intact; no rashes or lesions  Body mass index is 25.88 kg/m.  Assessment/ Plan: 76 y.o. female with a history of hypertension, hyperlipidemia, and diabetes who presents to clinic today for monitoring of chronic conditions.   Today's plan includes implementing a sliding scale for insulin administration for the patient,  considering her sensitivity as evidenced by highs in the 300s and lows in the 50s. Additionally, we will check magnesium levels due to reported cramps, and administer a flu shot. The A1c is elevated at 7.9, prompting the patient's desire to adjust her sugars through increased exercise, given recent sedentary behavior. There's an expectation that her sugars will normalize with this lifestyle adjustment. The patient reports a diet rich in fruits and vegetables, with no current changes. However, due to the patient experiencing lows in the 38s, there is hesitancy to add more insulin to her regimen at this time. The A1c was checked today, and lipids, CMP, and magnesium will also be assessed. No changes are planned for blood pressure medications, as the patient reports normal blood pressures at home, likely indicating white coat hypertension.  Orders Placed This Encounter  Procedures   DG [facility] DEXA    Order Specific Question:   Reason for Exam (SYMPTOM  OR DIAGNOSIS REQUIRED)    Answer:   screen osteoporosis   Microalbumin / creatinine urine ratio   Bayer DCA Hb A1c Waived   Magnesium   Lipid Panel   CMP14+EGFR   Meds ordered this encounter  Medications   alendronate (FOSAMAX) 70 MG tablet    Sig: TAKE 1 TABLET BY MOUTH ONCE A WEEK WITH  A  FULL  GLASS  OF  WATER  ON  AN  EMPTY  STOMACH    Dispense:  12 tablet    Refill:  3   amLODipine (NORVASC) 10 MG tablet    Sig: Take 1 tablet (10 mg total) by mouth daily.    Dispense:  90 tablet    Refill:  3   benazepril (LOTENSIN) 40 MG tablet    Sig: Take 1 tablet (40 mg total) by mouth daily.    Dispense:  90 tablet    Refill:  3   FLUoxetine (PROZAC) 10 MG capsule    Sig: Take 1 capsule (10 mg total) by mouth daily.  Dispense:  90 capsule    Refill:  3   hydrochlorothiazide (MICROZIDE) 12.5 MG capsule    Sig: Take 1 capsule (12.5 mg total) by mouth daily.    Dispense:  90 capsule    Refill:  3   insulin degludec (TRESIBA FLEXTOUCH) 100  UNIT/ML FlexTouch Pen    Sig: Inject 20-30 Units into the skin daily.    Dispense:  30 mL    Refill:  3   metFORMIN (GLUCOPHAGE) 500 MG tablet    Sig: Take 2 tablets (1,000 mg total) by mouth 2 (two) times daily with a meal.    Dispense:  360 tablet    Refill:  3   metoprolol tartrate (LOPRESSOR) 25 MG tablet    Sig: Take 1 tablet (25 mg total) by mouth 2 (two) times daily.    Dispense:  180 tablet    Refill:  3   omeprazole (PRILOSEC) 40 MG capsule    Sig: Take 1 capsule (40 mg total) by mouth 2 (two) times daily.    Dispense:  180 capsule    Refill:  3   pravastatin (PRAVACHOL) 40 MG tablet    Sig: Take 1 tablet (40 mg total) by mouth daily.    Dispense:  90 tablet    Refill:  3   insulin regular (NOVOLIN R) 100 units/mL injection    Sig: Use per sliding scale as directed.    Dispense:  10 mL    Refill:  PRN   Continuous Blood Gluc Sensor (FREESTYLE LIBRE 2 SENSOR) MISC    Sig: UAD to check sugars at least 4 times daily. E11.9    Dispense:  6 each    Refill:  [place], MS3

## 2022-06-06 LAB — LIPID PANEL
Chol/HDL Ratio: 2.2 ratio (ref 0.0–4.4)
Cholesterol, Total: 160 mg/dL (ref 100–199)
HDL: 74 mg/dL (ref >39)
LDL Chol Calc (NIH): 70 mg/dL (ref 0–99)
Triglycerides: 88 mg/dL (ref 0–149)
VLDL Cholesterol Cal: 16 mg/dL (ref 5–40)

## 2022-06-06 LAB — CMP14+EGFR
ALT: 12 IU/L (ref 0–32)
AST: 19 IU/L (ref 0–40)
Albumin/Globulin Ratio: 1.8 (ref 1.2–2.2)
Albumin: 4.4 g/dL (ref 3.8–4.8)
Alkaline Phosphatase: 86 IU/L (ref 44–121)
BUN/Creatinine Ratio: 18 (ref 12–28)
BUN: 14 mg/dL (ref 8–27)
Bilirubin Total: 0.3 mg/dL (ref 0.0–1.2)
CO2: 29 mmol/L (ref 20–29)
Calcium: 9.9 mg/dL (ref 8.7–10.3)
Chloride: 101 mmol/L (ref 96–106)
Creatinine, Ser: 0.78 mg/dL (ref 0.57–1.00)
Globulin, Total: 2.4 g/dL (ref 1.5–4.5)
Glucose: 230 mg/dL — ABNORMAL HIGH (ref 70–99)
Potassium: 4.7 mmol/L (ref 3.5–5.2)
Sodium: 143 mmol/L (ref 134–144)
Total Protein: 6.8 g/dL (ref 6.0–8.5)
eGFR: 78 mL/min/{1.73_m2} (ref >59)

## 2022-06-06 LAB — MAGNESIUM: Magnesium: 1.5 mg/dL — ABNORMAL LOW (ref 1.6–2.3)

## 2022-06-07 ENCOUNTER — Telehealth: Payer: Medicare Other

## 2022-06-07 LAB — SPECIMEN STATUS REPORT

## 2022-06-07 LAB — VITAMIN D 25 HYDROXY (VIT D DEFICIENCY, FRACTURES): Vit D, 25-Hydroxy: 64.4 ng/mL (ref 30.0–100.0)

## 2022-06-07 LAB — MICROALBUMIN / CREATININE URINE RATIO
Creatinine, Urine: 54.7 mg/dL
Microalb/Creat Ratio: 52 mg/g creat — ABNORMAL HIGH (ref 0–29)
Microalbumin, Urine: 28.2 ug/mL

## 2022-06-07 NOTE — Addendum Note (Signed)
Addended by: Everlean Cherry on: 06/07/2022 12:57 PM   Modules accepted: Orders

## 2022-06-12 ENCOUNTER — Telehealth: Payer: Self-pay | Admitting: Pharmacist

## 2022-06-12 DIAGNOSIS — E119 Type 2 diabetes mellitus without complications: Secondary | ICD-10-CM

## 2022-06-12 DIAGNOSIS — I152 Hypertension secondary to endocrine disorders: Secondary | ICD-10-CM

## 2022-06-12 NOTE — Telephone Encounter (Signed)
NEW REFERRAL NEEDED FOR CCM

## 2022-06-18 DIAGNOSIS — X32XXXD Exposure to sunlight, subsequent encounter: Secondary | ICD-10-CM | POA: Diagnosis not present

## 2022-06-18 DIAGNOSIS — B078 Other viral warts: Secondary | ICD-10-CM | POA: Diagnosis not present

## 2022-06-18 DIAGNOSIS — L570 Actinic keratosis: Secondary | ICD-10-CM | POA: Diagnosis not present

## 2022-07-15 DIAGNOSIS — E119 Type 2 diabetes mellitus without complications: Secondary | ICD-10-CM | POA: Diagnosis not present

## 2022-07-29 ENCOUNTER — Other Ambulatory Visit: Payer: Self-pay | Admitting: Family Medicine

## 2022-07-29 DIAGNOSIS — I152 Hypertension secondary to endocrine disorders: Secondary | ICD-10-CM

## 2022-08-20 ENCOUNTER — Ambulatory Visit (INDEPENDENT_AMBULATORY_CARE_PROVIDER_SITE_OTHER): Payer: Medicare Other | Admitting: Pharmacist

## 2022-08-20 VITALS — BP 130/80 | HR 75

## 2022-08-20 DIAGNOSIS — E119 Type 2 diabetes mellitus without complications: Secondary | ICD-10-CM

## 2022-08-20 DIAGNOSIS — E1169 Type 2 diabetes mellitus with other specified complication: Secondary | ICD-10-CM

## 2022-08-20 NOTE — Progress Notes (Signed)
Chronic Care Management Pharmacy Note  03/06/2022 Name:  Susan Petty MRN:  [id number] DOB:  1945/09/23  Summary:  Diabetes: Uncontrolled-A1C 7.9% with recurrent HYPOglycemia/falls (improved--only 1 episode of hypoglycemia); current treatment: TRESIBA PEN 20 UNITS QAM, Novolin R 2-3 units with lunch, METFORMIN Transition has been made to [place] 20 units at 9am Patient uses of Novolin Regular insulin 2-3 units with lunch daily -- previously discussed potentially giving rapid insulin for lunch time in the place of regular Patient reports hypoglycemia ~3x/week. Lowest observed ws 53 mg/dL over the weekend; 99 mg/dL this morning.  Dietary/habits are playing into patient's glycemic control (sweets, carbs, etc) Recommended higher protein/fiber options  Reduce Tresiba to 18 units daily given hypoglycemia still occurring.  Continue metformin and Novolin R Will f/u with patient in 3 months Patient wearing Libre 2 CGM; education provided Having CGM is making patient more aware of what she is eating and how she is taking insulin GFR 78, A1c 7.9%, UACR 52 Current glucose readings: fasting glucose: <150, post prandial glucose: varies  Patient Goals/Self-Care Activities Over the next 90 days, patient will:  - take medications as prescribed check glucose daily, document, and provide at future appointments  Follow Up Plan: Telephone follow up appointment with care management team member scheduled for: Face to Face appointment with care management team member scheduled for:  3 months   Subjective: Susan Petty is an 77 y.o. year old female who is a primary patient of Janora Norlander, DO.  The CCM team was consulted for assistance with disease management and care coordination needs.    Engaged with patient by telephone for follow up visit in response to provider referral for pharmacy case management and/or care coordination services.   Consent to Services:  The patient was given  information about Chronic Care Management services, agreed to services, and gave verbal consent prior to initiation of services.  Please see initial visit note for detailed documentation.   Patient Care Team: Janora Norlander, DO as PCP - General (Family Medicine) Danie Binder, MD (Inactive) as Consulting Physician (Gastroenterology) Lavera Guise, [facility] (Pharmacist) Eloise Harman, DO as Consulting Physician (Internal Medicine)  Objective:  Lab Results  Component Value Date   CREATININE 0.78 06/05/2022   CREATININE 0.76 01/25/2022   CREATININE 0.78 07/26/2021    Lab Results  Component Value Date   HGBA1C 7.9 (H) 06/05/2022   Last diabetic Eye exam: No results found for: "HMDIABEYEEXA"  Last diabetic Foot exam: No results found for: "HMDIABFOOTEX"      Component Value Date/Time   CHOL 160 06/05/2022 1141   CHOL 180 12/14/2012 1611   TRIG 88 06/05/2022 1141   TRIG 102 08/29/2014 1336   TRIG 85 12/14/2012 1611   HDL 74 06/05/2022 1141   HDL 77 08/29/2014 1336   HDL 72 12/14/2012 1611   CHOLHDL 2.2 06/05/2022 1141   LDLCALC 70 06/05/2022 1141   LDLCALC 101 (H) 01/11/2014 0837   LDLCALC 91 12/14/2012 1611       Latest Ref Rng & Units 06/05/2022   11:41 AM 03/06/2021    9:58 AM 05/18/2020    8:54 AM  Hepatic Function  Total Protein 6.0 - 8.5 g/dL 6.8  6.9  7.0   Albumin 3.8 - 4.8 g/dL 4.4  4.5  4.5   AST 0 - 40 IU/L 19  21  22   $ ALT 0 - 32 IU/L 12  17  14   $ Alk Phosphatase 44 -  121 IU/L 86  76  86   Total Bilirubin 0.0 - 1.2 mg/dL 0.3  0.4  0.3     Lab Results  Component Value Date/Time   TSH 3.230 01/25/2022 08:45 AM   TSH 4.110 08/28/2020 01:02 PM       Latest Ref Rng & Units 01/25/2022    8:45 AM 07/26/2021    8:09 AM 11/22/2019    1:31 PM  CBC  WBC 3.4 - 10.8 x10E3/uL 5.0  5.4    Hemoglobin 11.1 - 15.9 g/dL 11.3  11.5    Hematocrit 34.0 - 46.6 % 34.1  34.5  35.9   Platelets 150 - 450 x10E3/uL 176  181      Lab Results  Component Value  Date/Time   VD25OH 64.4 06/05/2022 11:41 AM   VD25OH 59.3 05/18/2020 08:54 AM    Clinical ASCVD: No  The 10-year ASCVD risk score (Arnett DK, et al., 2019) is: 59.1%   Values used to calculate the score:     Age: 33 years     Sex: Female     Is Non-Hispanic African American: No     Diabetic: Yes     Tobacco smoker: No     Systolic Blood Pressure: [id number] mmHg     Is BP treated: Yes     HDL Cholesterol: 74 mg/dL     Total Cholesterol: 160 mg/dL    Other: (CHADS2VASc if Afib, PHQ9 if depression, MMRC or CAT for COPD, ACT, DEXA)  Social History   Tobacco Use  Smoking Status Never  Smokeless Tobacco Never   BP Readings from Last 3 Encounters:  06/05/22 (!) 147/61  01/25/22 138/60  11/07/21 (!) 167/74   Pulse Readings from Last 3 Encounters:  06/05/22 62  01/25/22 60  11/07/21 67   Wt Readings from Last 3 Encounters:  06/05/22 150 lb 12.8 oz (68.4 kg)  01/25/22 149 lb 9.6 oz (67.9 kg)  11/07/21 148 lb 6.4 oz (67.3 kg)    Assessment: Review of patient past medical history, allergies, medications, health status, including review of consultants reports, laboratory and other test data, was performed as part of comprehensive evaluation and provision of chronic care management services.   SDOH:  (Social Determinants of Health) assessments and interventions performed:  SDOH Interventions    Flowsheet Row Clinical Support from 04/26/2022 in [facility] Clinical Support from 04/25/2021 in [facility]  SDOH Interventions    Food Insecurity Interventions Intervention Not Indicated Intervention Not Indicated  Housing Interventions Intervention Not Indicated Intervention Not Indicated  Transportation Interventions Intervention Not Indicated Intervention Not Indicated  Utilities Interventions Intervention Not Indicated --  Depression Interventions/Treatment  -- Currently on Treatment  Financial Strain Interventions  Intervention Not Indicated Intervention Not Indicated  Physical Activity Interventions Intervention Not Indicated Intervention Not Indicated  Stress Interventions Intervention Not Indicated Intervention Not Indicated  Social Connections Interventions Intervention Not Indicated Intervention Not Indicated       CCM Care Plan  Allergies  Allergen Reactions   Sulfa Antibiotics    Sulfa Drugs Cross Reactors Rash    Medications Reviewed Today     Reviewed by Everlean Cherry, CMA (Certified Medical Assistant) on 06/05/22 at 1047  Med List Status: <None>   Medication Order Taking? Sig Documenting Provider Last Dose Status Informant  Accu-Chek Softclix Lancets lancets [id number]  SMARTSIG:Topical 6 Times Daily [provider]  Active   alendronate (FOSAMAX) 70 MG tablet [id number]  TAKE 1  TABLET BY MOUTH ONCE A WEEK WITH  A  FULL  GLASS  OF  WATER  ON  AN  EMPTY  STOMACH  Patient not taking: Reported on 04/26/2022   Ronnie Doss M, DO  Active   amLODipine (NORVASC) 10 MG tablet [id number]  Take 1 tablet (10 mg total) by mouth daily. Ronnie Doss M, DO  Active   aspirin EC 81 MG tablet [id number]  Take 81 mg by mouth daily. [provider]  Active Self  benazepril (LOTENSIN) 40 MG tablet [id number]  Take 1 tablet (40 mg total) by mouth daily. Ronnie Doss M, DO  Active   blood glucose meter kit and supplies [id number]  Dispense based on patient and insurance preference. Use up to four times daily as directed. (FOR ICD-10 E10.9, E11.9). Baruch Gouty, FNP  Active   Blood Glucose Monitoring Suppl (ACCU-CHEK GUIDE) w/Device Drucie Opitz [id number]  CHECK GLUCOSE SIX TIMES DAILY Dx E11.9 Ronnie Doss M, DO  Active   carbamide peroxide (DEBROX) 6.5 % OTIC solution [id number]  Place 5 drops into both ears 2 (two) times daily.  Patient not taking: Reported on 04/26/2022   Baruch Gouty, FNP  Active   cetirizine (ZYRTEC) 5 MG tablet [id number]  Take 1 tablet by mouth once daily   Patient not taking: Reported on 04/26/2022   Gwenlyn Perking, FNP  Active   Cholecalciferol (VITAMIN D-3 PO) [id number]  Take 1,000 Units by mouth in the morning and at bedtime. [provider]  Active   Continuous Blood Gluc Sensor (FREESTYLE LIBRE 2 SENSOR) [place] [id number]  by Does not apply route. [provider]  Active            Med Note Blanca Friend, Royce Macadamia   Fri Jan 04, 2022  8:37 AM) ADS via parachute portal  FLUoxetine (PROZAC) 10 MG capsule [id number]  Take 1 capsule (10 mg total) by mouth daily. Ronnie Doss M, DO  Active   fluticasone (FLONASE) 50 MCG/ACT nasal spray [id number]  Place 2 sprays into both nostrils daily.  Patient not taking: Reported on 04/26/2022   Eustaquio Maize, MD  Active   glucose blood (ACCU-CHEK GUIDE) test strip [id number]  CHECK GLUCOSE SIX TIMES DAILY Dx E11.9 Ronnie Doss M, DO  Active   hydrochlorothiazide (MICROZIDE) 12.5 MG capsule [id number]  Take 1 capsule (12.5 mg total) by mouth daily. Ronnie Doss M, DO  Active   insulin degludec (TRESIBA FLEXTOUCH) 100 UNIT/ML FlexTouch Pen [id number]  Inject 20-30 Units into the skin daily.  Patient taking differently: Inject 22 Units into the skin daily.   Ronnie Doss M, DO  Active   Insulin Pen Needle (NOVOFINE PLUS PEN NEEDLE) 32G X 4 MM MISC [id number]  UAD with insulin pen E11.9 Ronnie Doss M, DO  Active   insulin regular (NOVOLIN R) 100 units/mL injection [id number]  Inject 3 Units into the skin daily with lunch. [provider]  Active            Med Note Michaela Corner   Fri Apr 26, 2022 10:05 AM) Patient takes prn   LINZESS 290 MCG CAPS capsule [id number]  TAKE 1 CAPSULE BY MOUTH ONCE DAILY BEFORE BREAKFAST  Patient not taking: Reported on 04/26/2022   Carlis Stable, NP  Active   metFORMIN (GLUCOPHAGE) 500 MG tablet [id number]  Take 2 tablets (1,000 mg total) by mouth 2 (two) times daily with a meal. (NEEDS TO BE SEEN BEFORE NEXT REFILL) Janora Norlander,  DO  Active   metoprolol tartrate (LOPRESSOR) 25 MG tablet [id number]  Take 1 tablet (25 mg total) by mouth 2 (two) times daily. Ronnie Doss M, DO  Active   nystatin (MYCOSTATIN/NYSTOP) powder [id number]  Apply 1 Application topically 3 (three) times daily. Sharion Balloon, FNP  Active   nystatin cream (MYCOSTATIN) [id number]  Apply 1 Application topically 2 (two) times daily. X7-10 days Sharion Balloon, FNP  Active   omeprazole (PRILOSEC) 40 MG capsule [id number]  Take 1 capsule by mouth twice daily Ronnie Doss M, DO  Active   ondansetron (ZOFRAN) 4 MG tablet [id number]  Take 1 tablet (4 mg total) by mouth every 8 (eight) hours as needed for nausea or vomiting.  Patient not taking: Reported on 04/26/2022   Sharion Balloon, FNP  Active   pravastatin (PRAVACHOL) 40 MG tablet [id number]  Take 1 tablet (40 mg total) by mouth daily. Ronnie Doss M, DO  Active   psyllium (METAMUCIL) 58.6 % powder [id number]  Take 1 packet by mouth daily. [provider]  Active   valACYclovir (VALTREX) 1000 MG tablet [id number]  Take 1 tablet by mouth twice daily Janora Norlander, DO  Active             Patient Active Problem List   Diagnosis Date Noted   Dysuria 08/15/2020   Age-related osteoporosis without current pathological fracture 12/11/2018   Constipation 06/19/2016   Allergic rhinitis 04/22/2016   Bowel habit changes 03/20/2016   Bloating 03/20/2016   Body mass index (BMI) of 23.0-23.9 in adult 07/20/2015   GAD (generalized anxiety disorder) 03/14/2015   GERD (gastroesophageal reflux disease) 12/14/2012   Type 2 diabetes mellitus with insulin therapy ([place]) 12/14/2012   Hyperlipidemia associated with type 2 diabetes mellitus ([place]) 12/14/2012   Hypertension associated with type 2 diabetes mellitus ([place]) 12/14/2012   S/P left THA, 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    Immunization History  Administered Date(s) Administered   Fluad Quad(high Dose 65+) 04/21/2019, 05/18/2020, 05/03/2021,  06/05/2022   Influenza, High Dose Seasonal PF 05/05/2013, 04/08/2017, 04/14/2018   Influenza,inj,Quad PF,6+ Mos 04/13/2014   Influenza-Unspecified 04/20/2012   Moderna Sars-Covid-2 Vaccination 09/13/2020, 11/07/2020, 03/28/2021   Pneumococcal Conjugate-13 03/14/2015   Pneumococcal Polysaccharide-23 04/14/2018   Td 10/18/1996   Tdap 05/16/2012    Conditions to be addressed/monitored: HLD, DMII, and CKD Stage 3a  There are no care plans that you recently modified to display for this patient.    Medication Assistance: None required.  Patient affirms current coverage meets needs.  Patient's preferred pharmacy is:  [facility] [address], [place] - [address] [place] [address] Phone: [phone] Fax: [phone]  [id number] [place] [address] [place]: [phone]: [phone]  Follow Up:  Patient agrees to Care Plan and Follow-up.  Plan: Telephone follow up appointment with care management team member scheduled for:  3 months    JRegina Eck PharmD, BCPS Clinical Pharmacist, [place] Phone [phone] RJoseph Art PFloridaD. PGY-2 Ambulatory Care Pharmacy Resident 08/20/2022 11:09 AM

## 2022-08-21 ENCOUNTER — Other Ambulatory Visit: Payer: Self-pay | Admitting: Family Medicine

## 2022-08-21 DIAGNOSIS — E119 Type 2 diabetes mellitus without complications: Secondary | ICD-10-CM

## 2022-08-22 ENCOUNTER — Telehealth: Payer: Self-pay | Admitting: Family Medicine

## 2022-08-22 DIAGNOSIS — E119 Type 2 diabetes mellitus without complications: Secondary | ICD-10-CM

## 2022-08-22 NOTE — Telephone Encounter (Signed)
  Prescription Request  08/22/2022  Is this a "Controlled Substance" medicine? no  Have you seen your PCP in the last 2 weeks? no  If YES, route message to pool  -  If NO, patient needs to be scheduled for appointment.  What is the name of the medication or equipment? insulin regular (HUMULIN R) 100 units/mL injection  Have you contacted your pharmacy to request a refill? yes   Which pharmacy would you like this sent to? [facility] [place], pt has [facility] getting [facility] Pt is [facility] of this medication    Patient notified that their request is being sent to the clinical staff for review and that they should receive a response within 2 business days.

## 2022-08-23 ENCOUNTER — Telehealth: Payer: Self-pay | Admitting: Family Medicine

## 2022-08-23 ENCOUNTER — Other Ambulatory Visit (HOSPITAL_COMMUNITY): Payer: Self-pay

## 2022-08-23 DIAGNOSIS — E119 Type 2 diabetes mellitus without complications: Secondary | ICD-10-CM

## 2022-08-23 MED ORDER — INSULIN REGULAR HUMAN 100 UNIT/ML IJ SOLN: INTRAMUSCULAR | 99 refills | Status: DC

## 2022-08-23 NOTE — Telephone Encounter (Signed)
Is she referring to Novolin R?  This was sent 06/05/2022 with refills through 06/06/2023.  She should be able to have refilled

## 2022-08-23 NOTE — Addendum Note (Signed)
Addended by: Janora Norlander on: 08/23/2022 12:09 PM   Modules accepted: Orders

## 2022-08-23 NOTE — Telephone Encounter (Signed)
resent

## 2022-08-23 NOTE — Telephone Encounter (Signed)
Pharmacy Patient Advocate Encounter   Received notification that prior authorization for NovoLIN R 100UNIT/ML solution is required/requested.  Per Test Claim: Humulin R preferred   PA submitted on 08/23/22 to (ins) [employer] via CoverMyMeds [place] Status is pending

## 2022-08-23 NOTE — Telephone Encounter (Signed)
Pts daughter called stating that [facility] stating that [facility] dont [facility] the Novolin R on file and that the only one they received in Nov 2023 was for the tresiba.  [facility] to confirm this and was told the same thing. Can we resend?

## 2022-08-23 NOTE — Telephone Encounter (Signed)
NOVOLIN R 100 UNIT/ML injection   Pharmacy comment: Alternative Requested:NEEDS PRIOR AUTHORIZATION

## 2022-08-23 NOTE — Telephone Encounter (Signed)
Insurance will not cover. Needing PA

## 2022-08-26 ENCOUNTER — Other Ambulatory Visit: Payer: Self-pay | Admitting: Family Medicine

## 2022-08-26 DIAGNOSIS — J309 Allergic rhinitis, unspecified: Secondary | ICD-10-CM

## 2022-08-26 NOTE — Telephone Encounter (Signed)
Patient Advocate Encounter  Prior Authorization for Novolin R  has been approved.    PA# [id number] Effective dates: approved through 07/08/2023

## 2022-08-27 ENCOUNTER — Telehealth: Payer: Self-pay | Admitting: Family Medicine

## 2022-08-27 NOTE — Telephone Encounter (Signed)
[facility] and they do not have this medication.   The other mecication that  Reli-On Novolin R

## 2022-08-27 NOTE — Telephone Encounter (Signed)
Per Dr Darnell Level she spoke with Almyra Free and said it is the same thing and ok to use with the same directions and pt's daughter is aware.

## 2022-08-27 NOTE — Telephone Encounter (Signed)
Patient's daughter calling because [facility] insulin regular (NOVOLIN R) 100 units/mL injection but [facility] told her that they have something similar and daughter wants to discuss with nurse first. Please call back to advise.

## 2022-08-27 NOTE — Telephone Encounter (Signed)
[facility] and they do not have this medication.    The other mecication that [facility] has is Reli-On Novolin R

## 2022-08-27 NOTE — Telephone Encounter (Signed)
I spoke to pt's daughter and advised to call [facility] to see if they have the Novolin insulin in stock and then [facility] can [facility] if [facility]. Pt voiced understanding.

## 2022-08-27 NOTE — Telephone Encounter (Signed)
I'm honestly not sure.  I will ask Almyra Free.

## 2022-08-27 NOTE — Telephone Encounter (Signed)
Dr Darnell Level is the Reli-On  Novolin R comparable to her current insulin and ok to use?

## 2022-09-05 DIAGNOSIS — E1169 Type 2 diabetes mellitus with other specified complication: Secondary | ICD-10-CM

## 2022-09-05 DIAGNOSIS — E119 Type 2 diabetes mellitus without complications: Secondary | ICD-10-CM

## 2022-09-05 DIAGNOSIS — Z794 Long term (current) use of insulin: Secondary | ICD-10-CM

## 2022-09-05 DIAGNOSIS — E785 Hyperlipidemia, unspecified: Secondary | ICD-10-CM

## 2022-10-16 DIAGNOSIS — E119 Type 2 diabetes mellitus without complications: Secondary | ICD-10-CM | POA: Diagnosis not present

## 2022-10-21 ENCOUNTER — Ambulatory Visit (INDEPENDENT_AMBULATORY_CARE_PROVIDER_SITE_OTHER): Payer: Medicare Other | Admitting: Family Medicine

## 2022-10-21 VITALS — BP 133/64 | HR 68 | Temp 98.5°F | Ht 64.0 in | Wt 141.8 lb

## 2022-10-21 DIAGNOSIS — Z794 Long term (current) use of insulin: Secondary | ICD-10-CM | POA: Diagnosis not present

## 2022-10-21 DIAGNOSIS — E1169 Type 2 diabetes mellitus with other specified complication: Secondary | ICD-10-CM

## 2022-10-21 DIAGNOSIS — I152 Hypertension secondary to endocrine disorders: Secondary | ICD-10-CM

## 2022-10-21 DIAGNOSIS — Z8639 Personal history of other endocrine, nutritional and metabolic disease: Secondary | ICD-10-CM

## 2022-10-21 DIAGNOSIS — E119 Type 2 diabetes mellitus without complications: Secondary | ICD-10-CM | POA: Diagnosis not present

## 2022-10-21 DIAGNOSIS — E1159 Type 2 diabetes mellitus with other circulatory complications: Secondary | ICD-10-CM | POA: Diagnosis not present

## 2022-10-21 DIAGNOSIS — E785 Hyperlipidemia, unspecified: Secondary | ICD-10-CM | POA: Diagnosis not present

## 2022-10-21 LAB — BAYER DCA HB A1C WAIVED: HB A1C (BAYER DCA - WAIVED): 8 % — ABNORMAL HIGH (ref 4.8–5.6)

## 2022-10-21 NOTE — Progress Notes (Signed)
Subjective: CC:DM PCP: Raliegh Ip, DO [id number] Susan Petty is a 77 y.o. female presenting to clinic today for:  1. Type 2 Diabetes with hypertension, hyperlipidemia:  Patient is compliant with her 18 units of Tresiba.  She notes that she is been having several overnight low blood sugars into the 50s and 60s.  She brings her freestyle libre to me today to review.  Average blood sugars are well into the 200s however.  She does not check a fingerstick glucose when she observes these low blood sugars and she does not necessarily feel symptomatic during them but will always eat some type of cookie or sugary beverage in efforts to bring her sugar back up.  She does have point-of-care testing supplies if needed however  She is compliant with her antihypertensive and cholesterol medication as well as metformin.  Not utilizing the Novolin at all given these overnight lows  Last eye exam: needs Last foot exam: UTD Last A1c:  Lab Results  Component Value Date   HGBA1C 7.9 (H) 06/05/2022   Nephropathy screen indicated?: UTD Last flu, zoster and/or pneumovax:  Immunization History  Administered Date(s) Administered   Fluad Quad(high Dose 65+) 04/21/2019, 05/18/2020, 05/03/2021, 06/05/2022   Influenza, High Dose Seasonal PF 05/05/2013, 04/08/2017, 04/14/2018   Influenza,inj,Quad PF,6+ Mos 04/13/2014   Influenza-Unspecified 04/20/2012   Moderna Sars-Covid-2 Vaccination 09/13/2020, 11/07/2020, 03/28/2021   Pneumococcal Conjugate-13 03/14/2015   Pneumococcal Polysaccharide-23 04/14/2018   Td 10/18/1996   Tdap 05/16/2012    ROS: No chest pain, shortness of breath, polydipsia, polyurea or visual disturbance reported to me today   ROS: Per HPI  Allergies  Allergen Reactions   Sulfa Antibiotics    Sulfa Drugs Cross Reactors Rash   Past Medical History:  Diagnosis Date   Diabetes mellitus    GERD (gastroesophageal reflux disease)    Hyperlipidemia    Hypertension    Osteoporosis      Current Outpatient Medications:    Accu-Chek Softclix Lancets lancets, SMARTSIG:Topical 6 Times Daily, Disp: , Rfl:    alendronate (FOSAMAX) 70 MG tablet, TAKE 1 TABLET BY MOUTH ONCE A WEEK WITH  A  FULL  GLASS  OF  WATER  ON  AN  EMPTY  STOMACH, Disp: 12 tablet, Rfl: 3   amLODipine (NORVASC) 10 MG tablet, Take 1 tablet (10 mg total) by mouth daily., Disp: 90 tablet, Rfl: 3   aspirin EC 81 MG tablet, Take 81 mg by mouth daily., Disp: , Rfl:    benazepril (LOTENSIN) 40 MG tablet, Take 1 tablet (40 mg total) by mouth daily., Disp: 90 tablet, Rfl: 3   blood glucose meter kit and supplies, Dispense based on patient and insurance preference. Use up to four times daily as directed. (FOR ICD-10 E10.9, E11.9)., Disp: 1 each, Rfl: 0   Blood Glucose Monitoring Suppl (ACCU-CHEK GUIDE) w/Device KIT, CHECK GLUCOSE SIX TIMES DAILY Dx E11.9, Disp: 1 kit, Rfl: 0   carbamide peroxide (DEBROX) 6.5 % OTIC solution, Place 5 drops into both ears 2 (two) times daily. (Patient not taking: Reported on 04/26/2022), Disp: 15 mL, Rfl: 0   cetirizine (ZYRTEC) 5 MG tablet, Take 1 tablet by mouth once daily (Patient not taking: Reported on 04/26/2022), Disp: 30 tablet, Rfl: 0   Cholecalciferol (VITAMIN D-3 PO), Take 1,000 Units by mouth in the morning and at bedtime., Disp: , Rfl:    Continuous Blood Gluc Sensor (FREESTYLE LIBRE 2 SENSOR) MISC, UAD to check sugars at least 4 times daily. E11.9, Disp: 6  each, Rfl: 4   FLUoxetine (PROZAC) 10 MG capsule, Take 1 capsule (10 mg total) by mouth daily., Disp: 90 capsule, Rfl: 3   fluticasone (FLONASE) 50 MCG/ACT nasal spray, Place 2 sprays into both nostrils daily. (Patient not taking: Reported on 04/26/2022), Disp: 16 g, Rfl: 6   glucose blood (ACCU-CHEK GUIDE) test strip, CHECK GLUCOSE SIX TIMES DAILY Dx E11.9, Disp: 600 each, Rfl: 3   hydrochlorothiazide (MICROZIDE) 12.5 MG capsule, Take 1 capsule (12.5 mg total) by mouth daily., Disp: 90 capsule, Rfl: 3   insulin degludec  (TRESIBA FLEXTOUCH) 100 UNIT/ML FlexTouch Pen, Inject 20-30 Units into the skin daily., Disp: 30 mL, Rfl: 3   Insulin Pen Needle (NOVOFINE PLUS PEN NEEDLE) 32G X 4 MM MISC, UAD with insulin pen E11.9, Disp: 100 each, Rfl: 3   insulin regular (NOVOLIN R) 100 units/mL injection, Use per sliding scale as directed., Disp: 10 mL, Rfl: PRN   LINZESS 290 MCG CAPS capsule, TAKE 1 CAPSULE BY MOUTH ONCE DAILY BEFORE BREAKFAST (Patient not taking: Reported on 04/26/2022), Disp: 30 capsule, Rfl: 11   metFORMIN (GLUCOPHAGE) 500 MG tablet, Take 2 tablets (1,000 mg total) by mouth 2 (two) times daily with a meal., Disp: 360 tablet, Rfl: 3   metoprolol tartrate (LOPRESSOR) 25 MG tablet, Take 1 tablet (25 mg total) by mouth 2 (two) times daily., Disp: 180 tablet, Rfl: 3   nystatin (MYCOSTATIN/NYSTOP) powder, Apply 1 Application topically 3 (three) times daily., Disp: 60 g, Rfl: 1   nystatin cream (MYCOSTATIN), Apply 1 Application topically 2 (two) times daily. X7-10 days, Disp: 30 g, Rfl: 0   omeprazole (PRILOSEC) 40 MG capsule, Take 1 capsule (40 mg total) by mouth 2 (two) times daily., Disp: 180 capsule, Rfl: 3   ondansetron (ZOFRAN) 4 MG tablet, Take 1 tablet (4 mg total) by mouth every 8 (eight) hours as needed for nausea or vomiting. (Patient not taking: Reported on 04/26/2022), Disp: 20 tablet, Rfl: 0   pravastatin (PRAVACHOL) 40 MG tablet, Take 1 tablet (40 mg total) by mouth daily., Disp: 90 tablet, Rfl: 3   psyllium (METAMUCIL) 58.6 % powder, Take 1 packet by mouth daily., Disp: , Rfl:    valACYclovir (VALTREX) 1000 MG tablet, Take 1 tablet by mouth twice daily, Disp: 20 tablet, Rfl: 0 Social History   Socioeconomic History   Marital status: Widowed    Spouse name: Not on file   Number of children: 2   Years of education: 24   Highest education level: 12th grade  Occupational History   Occupation: retired  Tobacco Use   Smoking status: Never   Smokeless tobacco: Never  Vaping Use   Vaping Use:  Never used  Substance and Sexual Activity   Alcohol use: Yes    Comment: occasional wine   Drug use: No   Sexual activity: Not Currently    Birth control/protection: None  Other Topics Concern   Not on file  Social History Narrative   Lives alone. Daughters visit frequently   Social Determinants of Health   Financial Resource Strain: Low Risk  (10/21/2022)   Overall Financial Resource Strain (CARDIA)    Difficulty of Paying Living Expenses: Not hard at all  Food Insecurity: No Food Insecurity (10/21/2022)   Hunger Vital Sign    Worried About Running Out of Food in the Last Year: Never true    Ran Out of Food in the Last Year: Never true  Transportation Needs: No Transportation Needs (10/21/2022)   PRAPARE - Transportation  Lack of Transportation (Medical): No    Lack of Transportation (Non-Medical): No  Physical Activity: Insufficiently Active (10/21/2022)   Exercise Vital Sign    Days of Exercise per Week: 3 days    Minutes of Exercise per Session: 20 min  Stress: No Stress Concern Present (10/21/2022)   [employer] of Occupational Health - Occupational Stress Questionnaire    Feeling of Stress : Not at all  Social Connections: Moderately Isolated (10/21/2022)   Social Connection and Isolation Panel [NHANES]    Frequency of Communication with Friends and Family: More than three times a week    Frequency of Social Gatherings with Friends and Family: More than three times a week    Attends Religious Services: More than 4 times per year    Active Member of [employer] or Organizations: No    Attends Banker Meetings: Never    Marital Status: Widowed  Intimate Partner Violence: Not At Risk (04/26/2022)   Humiliation, Afraid, Rape, and Kick questionnaire    Fear of Current or Ex-Partner: No    Emotionally Abused: No    Physically Abused: No    Sexually Abused: No   Family History  Problem Relation Age of Onset   Hypertension Mother    Hypertension Father     Diabetes Father    Hypertension Sister    Cancer Sister    Diabetes Brother    Diabetes Brother    Colon cancer Neg Hx     Objective: Office vital signs reviewed. BP 133/64   Pulse 68   Temp 98.5 F (36.9 C)   Ht  (1.626 m)   Wt 141 lb 12.8 oz (64.3 kg)   SpO2 95%   BMI 24.34 kg/m   Physical Examination:  General: Awake, alert, nontoxic female, No acute distress HEENT: Sclera white.  Moist mucous membranes Cardio: regular rate and rhythm, S1S2 heard, no murmurs appreciated Pulm: clear to auscultation bilaterally, no wheezes, rhonchi or rales; normal work of breathing on room air  Assessment/ Plan: 77 y.o. female   Type 2 diabetes mellitus with insulin therapy - Plan: Bayer DCA Hb A1c Waived, Microalbumin / creatinine urine ratio  Hyperlipidemia associated with type 2 diabetes mellitus  Hypertension associated with type 2 diabetes mellitus - Plan: Microalbumin / creatinine urine ratio  History of hypoglycemia - Plan: Bayer DCA Hb A1c Waived  Not sure that she is actually having a low blood sugars given reports of no symptomology.  I do wonder if perhaps she is compressing on that freestyle libre and she is getting erroneous blood sugar readings.  I encouraged her to check point-of-care blood sugars when she finds these sugars that are low on her libre machine and we will try determine if she is having true lows that would warrant alternative therapy regimen.  Check urine microalbumin.  Possible initiation of something like [place] or [place] given positive urine micro noted previously.  Continue statin  Blood pressure well-controlled no changes  Close follow-up in the next 2 weeks to review blood sugars  No orders of the defined types were placed in this encounter.  No orders of the defined types were placed in this encounter.    Raliegh Ip, DO [facility] [phone]

## 2022-10-22 LAB — MICROALBUMIN / CREATININE URINE RATIO
Creatinine, Urine: 260.9 mg/dL
Microalb/Creat Ratio: 17 mg/g creat (ref 0–29)
Microalbumin, Urine: 44.8 ug/mL

## 2022-11-04 ENCOUNTER — Ambulatory Visit: Payer: Medicare Other | Admitting: Family Medicine

## 2022-12-25 ENCOUNTER — Encounter: Payer: Self-pay | Admitting: Family Medicine

## 2022-12-25 ENCOUNTER — Ambulatory Visit (INDEPENDENT_AMBULATORY_CARE_PROVIDER_SITE_OTHER): Payer: Medicare Other | Admitting: Family Medicine

## 2022-12-25 VITALS — BP 137/60 | HR 56 | Temp 98.6°F | Ht 64.0 in | Wt 141.2 lb

## 2022-12-25 DIAGNOSIS — E785 Hyperlipidemia, unspecified: Secondary | ICD-10-CM | POA: Diagnosis not present

## 2022-12-25 DIAGNOSIS — F411 Generalized anxiety disorder: Secondary | ICD-10-CM

## 2022-12-25 DIAGNOSIS — E1159 Type 2 diabetes mellitus with other circulatory complications: Secondary | ICD-10-CM

## 2022-12-25 DIAGNOSIS — E119 Type 2 diabetes mellitus without complications: Secondary | ICD-10-CM | POA: Diagnosis not present

## 2022-12-25 DIAGNOSIS — I152 Hypertension secondary to endocrine disorders: Secondary | ICD-10-CM | POA: Diagnosis not present

## 2022-12-25 DIAGNOSIS — Z794 Long term (current) use of insulin: Secondary | ICD-10-CM

## 2022-12-25 DIAGNOSIS — E1169 Type 2 diabetes mellitus with other specified complication: Secondary | ICD-10-CM | POA: Diagnosis not present

## 2022-12-25 DIAGNOSIS — Z8639 Personal history of other endocrine, nutritional and metabolic disease: Secondary | ICD-10-CM

## 2022-12-25 LAB — BAYER DCA HB A1C WAIVED: HB A1C (BAYER DCA - WAIVED): 7.9 % — ABNORMAL HIGH (ref 4.8–5.6)

## 2022-12-25 MED ORDER — BUSPIRONE HCL 7.5 MG PO TABS: 7.5000 mg | ORAL_TABLET | Freq: Two times a day (BID) | ORAL | 0 refills | Status: DC

## 2022-12-25 NOTE — Progress Notes (Signed)
Subjective: CC:DM PCP: Raliegh Ip, DO [id number] Susan Petty is a 77 y.o. female presenting to clinic today for:  1. Type 2 Diabetes with hypertension, hyperlipidemia:  Patient reports compliance with her insulin.  She has had some hypoglycemic episodes down into the 50s.  She did point-of-care test this with a fingerstick and this was consistent.  She is typically injecting about 18 units of Tresiba and anywhere from 3 to 4 units of sliding scale Novolin.  She is compliant with 1000 mg of metformin twice daily, Norvasc 10 mg, benazepril 40 mg, Lopressor 25 mg twice daily and Pravachol 40 mg daily.  Last eye exam: needs Last foot exam: UTD Last A1c:  Lab Results  Component Value Date   HGBA1C 8.0 (H) 10/21/2022   Nephropathy screen indicated?: UTD Last flu, zoster and/or pneumovax:  Immunization History  Administered Date(s) Administered   Fluad Quad(high Dose 65+) 04/21/2019, 05/18/2020, 05/03/2021, 06/05/2022   Influenza, High Dose Seasonal PF 05/05/2013, 04/08/2017, 04/14/2018   Influenza,inj,Quad PF,6+ Mos 04/13/2014   Influenza-Unspecified 04/20/2012   Moderna Sars-Covid-2 Vaccination 09/13/2020, 11/07/2020, 03/28/2021   Pneumococcal Conjugate-13 03/14/2015   Pneumococcal Polysaccharide-23 04/14/2018   Td 10/18/1996   Tdap 05/16/2012    ROS: No chest pain, shortness of breath, blurred vision reported  2.  Generalized anxiety disorder panic attack Patient previously treated with benzodiazepine.  She had control of her symptoms for a while but notes that she started to get some anxiety again.  She would like to get something for her nerves.  ROS: Per HPI  Allergies  Allergen Reactions   Sulfa Antibiotics    Sulfa Drugs Cross Reactors Rash   Past Medical History:  Diagnosis Date   Diabetes mellitus    GERD (gastroesophageal reflux disease)    Hyperlipidemia    Hypertension    Osteoporosis     Current Outpatient Medications:    Accu-Chek Softclix Lancets  lancets, SMARTSIG:Topical 6 Times Daily, Disp: , Rfl:    alendronate (FOSAMAX) 70 MG tablet, TAKE 1 TABLET BY MOUTH ONCE A WEEK WITH  A  FULL  GLASS  OF  WATER  ON  AN  EMPTY  STOMACH, Disp: 12 tablet, Rfl: 3   amLODipine (NORVASC) 10 MG tablet, Take 1 tablet (10 mg total) by mouth daily., Disp: 90 tablet, Rfl: 3   aspirin EC 81 MG tablet, Take 81 mg by mouth daily., Disp: , Rfl:    benazepril (LOTENSIN) 40 MG tablet, Take 1 tablet (40 mg total) by mouth daily., Disp: 90 tablet, Rfl: 3   blood glucose meter kit and supplies, Dispense based on patient and insurance preference. Use up to four times daily as directed. (FOR ICD-10 E10.9, E11.9)., Disp: 1 each, Rfl: 0   Blood Glucose Monitoring Suppl (ACCU-CHEK GUIDE) w/Device KIT, CHECK GLUCOSE SIX TIMES DAILY Dx E11.9, Disp: 1 kit, Rfl: 0   carbamide peroxide (DEBROX) 6.5 % OTIC solution, Place 5 drops into both ears 2 (two) times daily., Disp: 15 mL, Rfl: 0   cetirizine (ZYRTEC) 5 MG tablet, Take 1 tablet by mouth once daily, Disp: 30 tablet, Rfl: 0   Cholecalciferol (VITAMIN D-3 PO), Take 1,000 Units by mouth in the morning and at bedtime., Disp: , Rfl:    Continuous Blood Gluc Sensor (FREESTYLE LIBRE 2 SENSOR) MISC, UAD to check sugars at least 4 times daily. E11.9, Disp: 6 each, Rfl: 4   FLUoxetine (PROZAC) 10 MG capsule, Take 1 capsule (10 mg total) by mouth daily., Disp: 90 capsule, Rfl: 3  fluticasone (FLONASE) 50 MCG/ACT nasal spray, Place 2 sprays into both nostrils daily., Disp: 16 g, Rfl: 6   glucose blood (ACCU-CHEK GUIDE) test strip, CHECK GLUCOSE SIX TIMES DAILY Dx E11.9, Disp: 600 each, Rfl: 3   hydrochlorothiazide (MICROZIDE) 12.5 MG capsule, Take 1 capsule (12.5 mg total) by mouth daily., Disp: 90 capsule, Rfl: 3   insulin degludec (TRESIBA FLEXTOUCH) 100 UNIT/ML FlexTouch Pen, Inject 20-30 Units into the skin daily., Disp: 30 mL, Rfl: 3   Insulin Pen Needle (NOVOFINE PLUS PEN NEEDLE) 32G X 4 MM MISC, UAD with insulin pen E11.9, Disp:  100 each, Rfl: 3   insulin regular (NOVOLIN R) 100 units/mL injection, Use per sliding scale as directed., Disp: 10 mL, Rfl: PRN   LINZESS 290 MCG CAPS capsule, TAKE 1 CAPSULE BY MOUTH ONCE DAILY BEFORE BREAKFAST, Disp: 30 capsule, Rfl: 11   metFORMIN (GLUCOPHAGE) 500 MG tablet, Take 2 tablets (1,000 mg total) by mouth 2 (two) times daily with a meal., Disp: 360 tablet, Rfl: 3   metoprolol tartrate (LOPRESSOR) 25 MG tablet, Take 1 tablet (25 mg total) by mouth 2 (two) times daily., Disp: 180 tablet, Rfl: 3   nystatin (MYCOSTATIN/NYSTOP) powder, Apply 1 Application topically 3 (three) times daily., Disp: 60 g, Rfl: 1   nystatin cream (MYCOSTATIN), Apply 1 Application topically 2 (two) times daily. X7-10 days, Disp: 30 g, Rfl: 0   omeprazole (PRILOSEC) 40 MG capsule, Take 1 capsule (40 mg total) by mouth 2 (two) times daily., Disp: 180 capsule, Rfl: 3   ondansetron (ZOFRAN) 4 MG tablet, Take 1 tablet (4 mg total) by mouth every 8 (eight) hours as needed for nausea or vomiting., Disp: 20 tablet, Rfl: 0   pravastatin (PRAVACHOL) 40 MG tablet, Take 1 tablet (40 mg total) by mouth daily., Disp: 90 tablet, Rfl: 3   psyllium (METAMUCIL) 58.6 % powder, Take 1 packet by mouth daily., Disp: , Rfl:    valACYclovir (VALTREX) 1000 MG tablet, Take 1 tablet by mouth twice daily, Disp: 20 tablet, Rfl: 0 Social History   Socioeconomic History   Marital status: Widowed    Spouse name: Not on file   Number of children: 2   Years of education: 15   Highest education level: 12th grade  Occupational History   Occupation: retired  Tobacco Use   Smoking status: Never   Smokeless tobacco: Never  Vaping Use   Vaping Use: Never used  Substance and Sexual Activity   Alcohol use: Yes    Comment: occasional wine   Drug use: No   Sexual activity: Not Currently    Birth control/protection: None  Other Topics Concern   Not on file  Social History Narrative   Lives alone. Daughters visit frequently   Social  Determinants of Health   Financial Resource Strain: Low Risk  (10/21/2022)   Overall Financial Resource Strain (CARDIA)    Difficulty of Paying Living Expenses: Not hard at all  Food Insecurity: No Food Insecurity (10/21/2022)   Hunger Vital Sign    Worried About Running Out of Food in the Last Year: Never true    Ran Out of Food in the Last Year: Never true  Transportation Needs: No Transportation Needs (10/21/2022)   PRAPARE - Administrator, Civil Service (Medical): No    Lack of Transportation (Non-Medical): No  Physical Activity: Insufficiently Active (10/21/2022)   Exercise Vital Sign    Days of Exercise per Week: 3 days    Minutes of Exercise per Session:  20 min  Stress: No Stress Concern Present (10/21/2022)   [employer] of Occupational Health - Occupational Stress Questionnaire    Feeling of Stress : Not at all  Social Connections: Moderately Isolated (10/21/2022)   Social Connection and Isolation Panel [NHANES]    Frequency of Communication with Friends and Family: More than three times a week    Frequency of Social Gatherings with Friends and Family: More than three times a week    Attends Religious Services: More than 4 times per year    Active Member of [employer] or Organizations: No    Attends Banker Meetings: Never    Marital Status: Widowed  Intimate Partner Violence: Not At Risk (04/26/2022)   Humiliation, Afraid, Rape, and Kick questionnaire    Fear of Current or Ex-Partner: No    Emotionally Abused: No    Physically Abused: No    Sexually Abused: No   Family History  Problem Relation Age of Onset   Hypertension Mother    Hypertension Father    Diabetes Father    Hypertension Sister    Cancer Sister    Diabetes Brother    Diabetes Brother    Colon cancer Neg Hx     Objective: Office vital signs reviewed. BP 137/60   Pulse (!) 56   Temp 98.6 F (37 C)   Ht 5\' 4"  (1.626 m)   Wt 141 lb 3.2 oz (64 kg)   SpO2 97%   BMI 24.24  kg/m   Physical Examination:  General: Awake, alert, well nourished, No acute distress HEENT: Sclera white.  Moist mucous membranes Cardio: regular rate and rhythm, S1S2 heard, no murmurs appreciated Pulm: clear to auscultation bilaterally, no wheezes, rhonchi or rales; normal work of breathing on room air Psych: Mood stable, speech normal, affect appropriate     12/25/2022    4:50 PM 06/05/2022   11:04 AM 04/26/2022   10:15 AM  Depression screen PHQ 2/9  Decreased Interest 1 0 0  Down, Depressed, Hopeless 1 0 0  PHQ - 2 Score 2 0 0  Altered sleeping 1    Tired, decreased energy 1    Change in appetite 0    Feeling bad or failure about yourself  1    Trouble concentrating 0    Moving slowly or fidgety/restless 0    Suicidal thoughts 0    PHQ-9 Score 5    Difficult doing work/chores Somewhat difficult        12/25/2022    4:51 PM 06/05/2022   11:03 AM 01/25/2022    8:12 AM 11/07/2021   10:28 AM  GAD 7 : Generalized Anxiety Score  Nervous, Anxious, on Edge 1 0 2 0  Control/stop worrying 1 0 2 0  Worry too much - different things 0 0 2 0  Trouble relaxing 0 0 0 0  Restless 0 0 0 0  Easily annoyed or irritable 0 0 0 0  Afraid - awful might happen 0 0 0 0  Total GAD 7 Score 2 0 6 0  Anxiety Difficulty Not difficult at all Not difficult at all Not difficult at all Not difficult at all    Assessment/ Plan: 77 y.o. female   Type 2 diabetes mellitus with insulin therapy (HCC) - Plan: Bayer DCA Hb A1c Waived  Hyperlipidemia associated with type 2 diabetes mellitus (HCC)  Hypertension associated with type 2 diabetes mellitus (HCC)  History of hypoglycemia  GAD (generalized anxiety disorder) - Plan: busPIRone (BUSPAR) 7.5  MG tablet  Sugar is borderline at 7.9.  She was interested in potentially starting a GLP.  I am sure she will need patient assistance for this so I will see if I can coordinate care with CCM to further discuss.  She has been having some hypoglycemia so I  am hesitant to make any further adjustments to insulin and would prefer to try and pursue some type of non-insulin therapy  She will continue blood pressure and cholesterol control for now  I have added buspirone for anxiety disorder  Orders Placed This Encounter  Procedures   Bayer DCA Hb A1c Waived   No orders of the defined types were placed in this encounter.    Raliegh Ip, DO [facility] [phone]

## 2023-01-06 ENCOUNTER — Telehealth: Payer: Self-pay

## 2023-01-06 NOTE — Progress Notes (Signed)
   Care Guide Note  01/06/2023 Name: Susan Petty MRN: [id number] DOB: April 06, 1946  Referred by: Raliegh Ip, DO Reason for referral : Care Coordination (Outreach to schedule with Pharm d )   Susan Petty is a 77 y.o. year old female who is a primary care patient of Raliegh Ip, DO. Susan Petty was referred to the pharmacist for assistance related to DM.    Successful contact was made with the patient to discuss pharmacy services including being ready for the pharmacist to call at least 5 minutes before the scheduled appointment time, to have medication bottles and any blood sugar or blood pressure readings ready for review. The patient agreed to meet with the pharmacist via with the pharmacist via telephone visit on (date/time).  02/07/2023  Penne Lash, RMA Care Guide [facility]  [place], [place] [address] Direct Dial: [phone] [email or web address]

## 2023-01-15 DIAGNOSIS — E119 Type 2 diabetes mellitus without complications: Secondary | ICD-10-CM | POA: Diagnosis not present

## 2023-02-07 ENCOUNTER — Ambulatory Visit (INDEPENDENT_AMBULATORY_CARE_PROVIDER_SITE_OTHER): Payer: Medicare Other | Admitting: Pharmacist

## 2023-02-07 DIAGNOSIS — E119 Type 2 diabetes mellitus without complications: Secondary | ICD-10-CM

## 2023-02-07 DIAGNOSIS — Z794 Long term (current) use of insulin: Secondary | ICD-10-CM

## 2023-02-07 NOTE — Progress Notes (Signed)
   02/07/2023 Name: Susan Petty MRN: [id number] DOB: 06-29-1946  Chief Complaint  Patient presents with   Diabetes    MODENE CLOER is a 77 y.o. year old female who presented for a telephone visit.  I connected with  Ellison Hughs on 02/25/23 by telephone and verified that I am speaking with the correct person using two identifiers. I discussed the limitations of evaluation and management by telemedicine. The patient expressed understanding and agreed to proceed.  Patient was located in her home and PharmD in PCP office during this visit.   They were referred to the pharmacist by their PCP for assistance in managing diabetes.  Patient is interested in starting Ozempic for additional glycemic control & cardiac protection.  Subjective:  Care Team: Primary Care Provider: Raliegh Ip, DO  Medication Access/Adherence  Current Pharmacy:  [facility] [address], [place] - [address] [place] [address] Phone: [phone] Fax: [phone]  [facility] [phone] - [place], [place] - [address] [place] [address] Phone: [phone] Fax: [phone]   Patient reports affordability concerns with their medications: No  Patient reports access/transportation concerns to their pharmacy: No  Patient reports adherence concerns with their medications:  No     Diabetes:  Current medications: Tresib (AM), Novolin R PRN meals, metformin Medications tried in the past: was on NPH & R Uncontrolled-A1C 7.9% with recurrent HYPOglycemia/falls (improved--only 1 episode of hypoglycemia); current treatment: TRESIBA PEN 20 UNITS QAM, Novolin R 2-3 units with lunch, METFORMIN   Current glucose readings: FBG<130, reports some hypoglycemia overnight Using Libre 2 CGM; testing continuously  -Patient is injecting insulin 3 or more times daily -She greatly benefits from a continuous glucose monitoring system  Patient reports  hypoglycemic s/sx including dizziness, shakiness, sweating. Patient denies hyperglycemic symptoms including polyuria, polydipsia, polyphagia, nocturia, neuropathy, blurred vision.  Current physical activity: encouraged as able  Current medication access support: n/a   Objective:  Lab Results  Component Value Date   HGBA1C 7.9 (H) 12/25/2022    Lab Results  Component Value Date   CREATININE 0.78 06/05/2022   BUN 14 06/05/2022   NA 143 06/05/2022   K 4.7 06/05/2022   CL 101 06/05/2022   CO2 29 06/05/2022    Lab Results  Component Value Date   CHOL 160 06/05/2022   HDL 74 06/05/2022   LDLCALC 70 06/05/2022   TRIG 88 06/05/2022   CHOLHDL 2.2 06/05/2022    Medications Reviewed Today   Medications were not reviewed in this encounter     Assessment/Plan:   Diabetes: - Currently uncontrolled - Reviewed long term cardiovascular and renal outcomes of uncontrolled blood sugar - Reviewed goal A1c, goal fasting, and goal 2 hour post prandial glucose - Recommend to: Start Ozempic 0.25mg  sq weekly for now (titrate slowly due to GI)--patient denies personal or family history of medullary thyroid carcinoma (MTC) or in patients with Multiple Endocrine Neoplasia syndrome type 2 (MEN 2) STOP Novolin R Continue Tresiba 18-20 units every morning - Recommend to check glucose using Libre 2 CGM   Follow Up Plan: 1 month 30 min of patient care was provided to the patient during this visit time    Kieth Brightly, PharmD, BCACP Clinical Pharmacist, [facility]

## 2023-02-12 DIAGNOSIS — Z1283 Encounter for screening for malignant neoplasm of skin: Secondary | ICD-10-CM | POA: Diagnosis not present

## 2023-02-12 DIAGNOSIS — D225 Melanocytic nevi of trunk: Secondary | ICD-10-CM | POA: Diagnosis not present

## 2023-02-12 DIAGNOSIS — L570 Actinic keratosis: Secondary | ICD-10-CM | POA: Diagnosis not present

## 2023-02-12 DIAGNOSIS — X32XXXD Exposure to sunlight, subsequent encounter: Secondary | ICD-10-CM | POA: Diagnosis not present

## 2023-02-25 ENCOUNTER — Encounter: Payer: Self-pay | Admitting: Pharmacist

## 2023-02-25 MED ORDER — OZEMPIC (0.25 OR 0.5 MG/DOSE) 2 MG/3ML ~~LOC~~ SOPN: 0.2500 mg | PEN_INJECTOR | SUBCUTANEOUS | Status: DC

## 2023-02-27 ENCOUNTER — Ambulatory Visit (INDEPENDENT_AMBULATORY_CARE_PROVIDER_SITE_OTHER): Payer: Medicare Other | Admitting: Pharmacist

## 2023-02-27 ENCOUNTER — Telehealth: Payer: Medicare Other

## 2023-02-27 DIAGNOSIS — E119 Type 2 diabetes mellitus without complications: Secondary | ICD-10-CM

## 2023-02-27 DIAGNOSIS — Z794 Long term (current) use of insulin: Secondary | ICD-10-CM

## 2023-02-27 NOTE — Progress Notes (Addendum)
02/27/2023 Name: Susan Petty MRN: [id number] DOB: [birth year]  Chief Complaint  Patient presents with   Diabetes   Subjective:  Susan Petty is a 77 y.o. year old female who presented for a telephone visit.   They were referred to the pharmacist by their PCP for assistance in managing diabetes. At last visit Susan Petty discussed starting Ozempic with the clinic pharmacist. Today Susan Petty reported she had not yet started Ozempic. She expressed concern about possibly experiencing high blood sugars while on Ozempic but not having the Novolin R to correct. In the past she would only correct with Novolin if her blood sugar was > 300 and this typically occurred only a few times. This morning her fasting BG was 130-140. Currently, throughout the day her BG is typically above 200. She did agree to start Ozempic for blood sugar management.   Care Team: Primary Care Provider: Raliegh Ip, DO  [facility] Clinical Pharmacist: Vanice Sarah, Next Scheduled Appointment  03/06/2023   Medication Access/Adherence  Current Pharmacy:  [facility] [address] [place] - [address] [place] [address] Phone: [phone] Fax: [phone]  [id number] - [place] [place] - [address] [place] [address] Phone: [phone] Fax: [phone]   Patient reports affordability concerns with their medications: No  Patient reports access/transportation concerns to their pharmacy: No  Patient reports adherence concerns with their medications:  No     Diabetes:  Current medications:  Tresiba 18-20 units at bedtime Ozempic 0.25 mg  weekly (not yet started) Metformin 1000 mg bid   Medications tried in the past:  Novolin R SSI  Current glucose readings: 200's during the day; this morning FBG was 130-140 Patient is using a CGM sensor (FreeStyle [place] 2)  Patient denies hypoglycemic s/sx including dizziness, shakiness, sweating. Patient  denies hyperglycemic symptoms including polyuria, polydipsia, polyphagia, nocturia, neuropathy, blurred vision.  Patient denies personal or family history of medullary thyroid carcinoma (MTC) or in patients with Multiple Endocrine Neoplasia syndrome type 2 (MEN 2)   Objective:  Lab Results  Component Value Date   HGBA1C 7.9 (H) 12/25/2022    Lab Results  Component Value Date   CREATININE 0.78 06/05/2022   BUN 14 06/05/2022   NA 143 06/05/2022   K 4.7 06/05/2022   CL 101 06/05/2022   CO2 29 06/05/2022    Lab Results  Component Value Date   CHOL 160 06/05/2022   HDL 74 06/05/2022   LDLCALC 70 06/05/2022   TRIG 88 06/05/2022   CHOLHDL 2.2 06/05/2022    Medications Reviewed Today     Reviewed by Gwenlyn Found, RPH (Pharmacist) on 02/27/23 at 1042  Med List Status: <None>   Medication Order Taking? Sig Documenting Provider Last Dose Status Informant  Accu-Chek Softclix Lancets lancets [id number]  SMARTSIG:Topical 6 Times Daily [provider]  Active   alendronate (FOSAMAX) 70 MG tablet [id number]  TAKE 1 TABLET BY MOUTH ONCE A WEEK WITH  A  FULL  GLASS  OF  WATER  ON  AN  EMPTY  STOMACH Delynn Flavin M, DO  Active   amLODipine (NORVASC) 10 MG tablet [id number]  Take 1 tablet (10 mg total) by mouth daily. Delynn Flavin M, DO  Active   aspirin EC 81 MG tablet [id number]  Take 81 mg by mouth daily. [provider]  Active Self  benazepril (LOTENSIN) 40 MG tablet [id number]  Take 1 tablet (40 mg total) by  mouth daily. Delynn Flavin M, DO  Active   blood glucose meter kit and supplies [id number]  Dispense based on patient and insurance preference. Use up to four times daily as directed. (FOR ICD-10 E10.9, E11.9). Sonny Masters, FNP  Active   Blood Glucose Monitoring Suppl (ACCU-CHEK GUIDE) w/Device Andria Rhein [id number]  CHECK GLUCOSE SIX TIMES DAILY Dx E11.9 Delynn Flavin M, DO  Active   busPIRone (BUSPAR) 7.5 MG tablet [id number]  Take 1 tablet (7.5 mg  total) by mouth in the morning and at bedtime. For anxiety Delynn Flavin M, DO  Active   carbamide peroxide (DEBROX) 6.5 % OTIC solution [id number]  Place 5 drops into both ears 2 (two) times daily. Sonny Masters, FNP  Active   cetirizine (ZYRTEC) 5 MG tablet [id number]  Take 1 tablet by mouth once daily Gabriel Earing, FNP  Active   Cholecalciferol (VITAMIN D-3 PO) [id number]  Take 1,000 Units by mouth in the morning and at bedtime. [provider]  Active   Continuous Blood Gluc Sensor (FREESTYLE LIBRE 2 SENSOR) MISC [id number]  UAD to check sugars at least 4 times daily. E11.9 Raliegh Ip, DO  Active            Med Note Donn Pierini Aug 22, 2022 10:38 PM)    FLUoxetine (PROZAC) 10 MG capsule [id number]  Take 1 capsule (10 mg total) by mouth daily. Delynn Flavin M, DO  Active   fluticasone (FLONASE) 50 MCG/ACT nasal spray [id number]  Place 2 sprays into both nostrils daily. Johna Sheriff, MD  Active   glucose blood (ACCU-CHEK GUIDE) test strip [id number]  CHECK GLUCOSE SIX TIMES DAILY Dx E11.9 Delynn Flavin M, DO  Active   hydrochlorothiazide (MICROZIDE) 12.5 MG capsule [id number]  Take 1 capsule (12.5 mg total) by mouth daily. Delynn Flavin M, DO  Active   insulin degludec (TRESIBA FLEXTOUCH) 100 UNIT/ML FlexTouch Pen [id number]  Inject 20-30 Units into the skin daily. Delynn Flavin M, DO  Active   Insulin Pen Needle (NOVOFINE PLUS PEN NEEDLE) 32G X 4 MM MISC [id number]  UAD with insulin pen E11.9 Raliegh Ip, DO  Active   LINZESS 290 MCG CAPS capsule [id number]  TAKE 1 CAPSULE BY MOUTH ONCE DAILY BEFORE BREAKFAST Anice Paganini, NP  Active   metFORMIN (GLUCOPHAGE) 500 MG tablet [id number]  Take 2 tablets (1,000 mg total) by mouth 2 (two) times daily with a meal. Delynn Flavin M, DO  Active   metoprolol tartrate (LOPRESSOR) 25 MG tablet [id number]  Take 1 tablet (25 mg total) by mouth 2 (two) times daily. Delynn Flavin M, DO  Active   nystatin  (MYCOSTATIN/NYSTOP) powder [id number]  Apply 1 Application topically 3 (three) times daily. Junie Spencer, FNP  Active   nystatin cream (MYCOSTATIN) [id number]  Apply 1 Application topically 2 (two) times daily. X7-10 days Junie Spencer, FNP  Active   omeprazole (PRILOSEC) 40 MG capsule [id number]  Take 1 capsule (40 mg total) by mouth 2 (two) times daily. Delynn Flavin M, DO  Active   ondansetron (ZOFRAN) 4 MG tablet [id number]  Take 1 tablet (4 mg total) by mouth every 8 (eight) hours as needed for nausea or vomiting. Jannifer Rodney A, FNP  Active   pravastatin (PRAVACHOL) 40 MG tablet [id number]  Take 1 tablet (40 mg total) by mouth daily. Delynn Flavin M, DO  Active   psyllium (METAMUCIL) 58.6 % powder [id number]  Take 1 packet by mouth daily. [provider]  Active   Semaglutide,0.25 or 0.5MG /DOS, (OZEMPIC, 0.25 OR 0.5 MG/DOSE,) 2 MG/3ML SOPN [id number] No Inject 0.25 mg into the skin once a week.  Patient not taking: Reported on 02/27/2023   Raliegh Ip, DO Not Taking Active   valACYclovir (VALTREX) 1000 MG tablet [id number]  Take 1 tablet by mouth twice daily Delynn Flavin M, DO  Active              Assessment/Plan:   Diabetes: - Currently uncontrolled. A1c 7.9 - Reviewed long term cardiovascular and renal outcomes of uncontrolled blood sugar - Reviewed goal A1c, goal fasting, and goal 2 hour post prandial glucose - Recommend to start Ozempic and hold Novolin R unless BG > 300, at which time she may administer 1 unit. She will continue Metformin as prescribed.  - Recommend to check glucose using Freestyle Libre 2 CGM   Follow Up Plan: She will start Ozempic and return for telephonic visit on 03/06/2023 to discuss blood sugar management.   Sofie Rower, PharmD, [facility] PGY1   Kieth Brightly, PharmD, BCACP Clinical Pharmacist, [facility]

## 2023-03-06 ENCOUNTER — Telehealth: Payer: Medicare Other

## 2023-03-06 NOTE — Progress Notes (Deleted)
   03/06/2023 Name: Susan Petty MRN: [id number] DOB: 1945-10-18  No chief complaint on file.   Susan Petty is a 77 y.o. year old female who presented for a telephone visit.   They were referred to the pharmacist by their PCP for assistance in managing diabetes.  Have you gotten any low BGs? Sample should be good for 6 weeks   [phone] Subjective:  Care Team: Primary Care Provider: Raliegh Ip, DO ; Next Scheduled Visit: *** {careteamprovider:27366}  Medication Access/Adherence  Current Pharmacy:  [facility] [address], [place] - [address] [place] [address] Phone: [phone] Fax: [phone]  [id number] - [place], [place] - [address] [place] [address] Phone: [phone] Fax: [phone]   Patient reports affordability concerns with their medications: {YES/NO:21197} Patient reports access/transportation concerns to their pharmacy: {YES/NO:21197} Patient reports adherence concerns with their medications:  {YES/NO:21197} ***   Diabetes:  Current medications:  Medications tried in the past:   Current glucose readings: *** Using *** meter; testing *** times daily  @CGM @  Patient {Actions; denies-reports:120008} hypoglycemic s/sx including ***dizziness, shakiness, sweating. Patient {Actions; denies-reports:120008} hyperglycemic symptoms including ***polyuria, polydipsia, polyphagia, nocturia, neuropathy, blurred vision.  Current meal patterns:  - Breakfast: *** - Lunch *** - Supper *** - Snacks *** - Drinks ***  Current physical activity: ***  Current medication access support: ***   Objective:  Lab Results  Component Value Date   HGBA1C 7.9 (H) 12/25/2022    Lab Results  Component Value Date   CREATININE 0.78 06/05/2022   BUN 14 06/05/2022   NA 143 06/05/2022   K 4.7 06/05/2022   CL 101 06/05/2022   CO2 29 06/05/2022    Lab Results  Component  Value Date   CHOL 160 06/05/2022   HDL 74 06/05/2022   LDLCALC 70 06/05/2022   TRIG 88 06/05/2022   CHOLHDL 2.2 06/05/2022    Medications Reviewed Today   Medications were not reviewed in this encounter      Assessment/Plan:   Diabetes: - Currently {CHL Controlled/Uncontrolled:[phone]} - Reviewed long term cardiovascular and renal outcomes of uncontrolled blood sugar - Reviewed goal A1c, goal fasting, and goal 2 hour post prandial glucose - Reviewed dietary modifications including *** - Reviewed lifestyle modifications including: - Recommend to ***  - Recommend to check glucose *** - Meets financial criteria for *** patient assistance program through ***. Will collaborate with provider, CPhT, and patient to pursue assistance.     Follow Up Plan: ***  ***

## 2023-03-25 ENCOUNTER — Other Ambulatory Visit: Payer: Self-pay | Admitting: Family Medicine

## 2023-03-25 DIAGNOSIS — F411 Generalized anxiety disorder: Secondary | ICD-10-CM

## 2023-04-07 ENCOUNTER — Encounter: Payer: Self-pay | Admitting: Family Medicine

## 2023-04-07 ENCOUNTER — Ambulatory Visit (INDEPENDENT_AMBULATORY_CARE_PROVIDER_SITE_OTHER): Payer: Medicare Other

## 2023-04-07 VITALS — BP 141/56 | HR 63 | Temp 98.2°F | Ht 64.0 in | Wt 143.2 lb

## 2023-04-07 DIAGNOSIS — B9689 Other specified bacterial agents as the cause of diseases classified elsewhere: Secondary | ICD-10-CM | POA: Diagnosis not present

## 2023-04-07 DIAGNOSIS — N760 Acute vaginitis: Secondary | ICD-10-CM

## 2023-04-07 DIAGNOSIS — Z23 Encounter for immunization: Secondary | ICD-10-CM | POA: Diagnosis not present

## 2023-04-07 DIAGNOSIS — R300 Dysuria: Secondary | ICD-10-CM

## 2023-04-07 LAB — URINALYSIS, ROUTINE W REFLEX MICROSCOPIC
Bilirubin, UA: NEGATIVE
Glucose, UA: NEGATIVE
Ketones, UA: NEGATIVE
Leukocytes,UA: NEGATIVE
Nitrite, UA: NEGATIVE
Protein,UA: NEGATIVE
RBC, UA: NEGATIVE
Specific Gravity, UA: 1.01 (ref 1.005–1.030)
Urobilinogen, Ur: 0.2 mg/dL (ref 0.2–1.0)
pH, UA: 7 (ref 5.0–7.5)

## 2023-04-07 LAB — WET PREP FOR TRICH, YEAST, CLUE
Clue Cell Exam: POSITIVE — AB
Trichomonas Exam: NEGATIVE
Yeast Exam: NEGATIVE

## 2023-04-07 MED ORDER — METRONIDAZOLE 500 MG PO TABS: 500.0000 mg | ORAL_TABLET | Freq: Two times a day (BID) | ORAL | 0 refills | Status: DC

## 2023-04-07 NOTE — Progress Notes (Addendum)
   Acute Office Visit  Subjective:     Patient ID: Susan Petty, female    DOB: April 26, 1946, 77 y.o.   MRN: [id number]  Chief Complaint  Patient presents with   Dysuria    Dysuria  This is a new problem. The current episode started in the past 7 days. The problem occurs every urination. The problem has been unchanged. The patient is experiencing no pain. There has been no fever. Associated symptoms include frequency and urgency. Pertinent negatives include no chills, discharge, flank pain, hematuria, hesitancy, nausea, possible pregnancy, sweats or vomiting. Treatments tried: AZO. The treatment provided no relief. There is no history of kidney stones or recurrent UTIs.   Did a home urine test 3 days ago that was concerning for a UTI. She also reports some vaginal itching.   Review of Systems  Constitutional:  Negative for chills.  Gastrointestinal:  Negative for nausea and vomiting.  Genitourinary:  Positive for dysuria, frequency and urgency. Negative for flank pain, hematuria and hesitancy.        Objective:    BP (!) 141/56   Pulse 63   Temp 98.2 F (36.8 C) (Temporal)   Ht 5\' 4"  (1.626 m)   Wt 143 lb 4 oz (65 kg)   SpO2 99%   BMI 24.59 kg/m    Physical Exam Vitals and nursing note reviewed.  Constitutional:      General: She is not in acute distress.    Appearance: Normal appearance. She is not ill-appearing, toxic-appearing or diaphoretic.  Pulmonary:     Effort: Pulmonary effort is normal. No respiratory distress.  Abdominal:     General: Bowel sounds are normal. There is no distension.     Palpations: Abdomen is soft.     Tenderness: There is no abdominal tenderness. There is no right CVA tenderness, left CVA tenderness, guarding or rebound.  Musculoskeletal:     Right lower leg: No edema.     Left lower leg: No edema.  Skin:    General: Skin is warm and dry.  Neurological:     General: No focal deficit present.     Mental Status: She is alert and  oriented to person, place, and time.  Psychiatric:        Mood and Affect: Mood normal.        Behavior: Behavior normal.     Urine dipstick shows negative for all components.    Microscopic wet-mount exam shows clue cells.      Assessment & Plan:   Susan Petty was seen today for dysuria.  Diagnoses and all orders for this visit:  Dysuria Negative UA today. Wet prep with clue cells. Will treat with flagyl for BV. Urine culture pending. Return to office for new or worsening symptoms, or if symptoms persist.  -     Urinalysis, Routine w reflex microscopic -     WET PREP FOR TRICH, YEAST, CLUE -     Urine Culture  Bacterial vaginosis -     metroNIDAZOLE (FLAGYL) 500 MG tablet; Take 1 tablet (500 mg total) by mouth 2 (two) times daily.  Flu vaccine today in office  The patient indicates understanding of these issues and agrees with the plan.   Gabriel Earing, FNP

## 2023-04-07 NOTE — Addendum Note (Signed)
Addended by: Gabriel Earing on: 04/07/2023 02:12 PM   Modules accepted: Level of Service

## 2023-04-07 NOTE — Patient Instructions (Signed)

## 2023-04-11 ENCOUNTER — Other Ambulatory Visit: Payer: Self-pay | Admitting: Family Medicine

## 2023-04-11 DIAGNOSIS — N3000 Acute cystitis without hematuria: Secondary | ICD-10-CM

## 2023-04-11 LAB — URINE CULTURE

## 2023-04-11 MED ORDER — AMOXICILLIN 500 MG PO CAPS
500.0000 mg | ORAL_CAPSULE | Freq: Three times a day (TID) | ORAL | 0 refills | Status: AC
Start: 2023-04-11 — End: 2023-04-16

## 2023-04-16 DIAGNOSIS — X32XXXD Exposure to sunlight, subsequent encounter: Secondary | ICD-10-CM | POA: Diagnosis not present

## 2023-04-16 DIAGNOSIS — L570 Actinic keratosis: Secondary | ICD-10-CM | POA: Diagnosis not present

## 2023-04-29 ENCOUNTER — Ambulatory Visit: Payer: Medicare Other

## 2023-04-29 VITALS — Ht 65.0 in | Wt 143.0 lb

## 2023-04-29 DIAGNOSIS — Z0000 Encounter for general adult medical examination without abnormal findings: Secondary | ICD-10-CM | POA: Diagnosis not present

## 2023-04-29 DIAGNOSIS — E119 Type 2 diabetes mellitus without complications: Secondary | ICD-10-CM | POA: Diagnosis not present

## 2023-04-29 NOTE — Progress Notes (Signed)
Subjective:   Susan Petty is a 77 y.o. female who presents for Medicare Annual (Subsequent) preventive examination.  Visit Complete: Virtual I connected with  Ellison Hughs on 04/29/23 by a audio enabled telemedicine application and verified that I am speaking with the correct person using two identifiers.  Patient Location: Home  Provider Location: Home Office  I discussed the limitations of evaluation and management by telemedicine. The patient expressed understanding and agreed to proceed.  Vital Signs: Because this visit was a virtual/telehealth visit, some criteria may be missing or patient reported. Any vitals not documented were not able to be obtained and vitals that have been documented are patient reported.  Patient Medicare AWV questionnaire was completed by the patient on 04/29/2023; I have confirmed that all information answered by patient is correct and no changes since this date.  Cardiac Risk Factors include: advanced age (>24men, >66 women);diabetes mellitus;dyslipidemia;hypertension     Objective:    Today's Vitals   04/29/23 1036  Weight: 143 lb (64.9 kg)  Height: 5\' 5"  (1.651 m)   Body mass index is 23.8 kg/m.     04/29/2023   10:40 AM 04/26/2022   10:23 AM 04/25/2021   10:09 AM 04/19/2020    9:43 AM 04/19/2019    9:52 AM 04/14/2018   10:37 AM 03/29/2016    9:25 AM  Advanced Directives  Does Patient Have a Medical Advance Directive? No No No No No No No  Would patient like information on creating a medical advance directive? Yes (MAU/Ambulatory/Procedural Areas - Information given) No - Patient declined No - Patient declined No - Patient declined No - Patient declined Yes (MAU/Ambulatory/Procedural Areas - Information given) No - patient declined information    Current Medications (verified) Outpatient Encounter Medications as of 04/29/2023  Medication Sig   Accu-Chek Softclix Lancets lancets SMARTSIG:Topical 6 Times Daily   alendronate  (FOSAMAX) 70 MG tablet TAKE 1 TABLET BY MOUTH ONCE A WEEK WITH  A  FULL  GLASS  OF  WATER  ON  AN  EMPTY  STOMACH   amLODipine (NORVASC) 10 MG tablet Take 1 tablet (10 mg total) by mouth daily.   aspirin EC 81 MG tablet Take 81 mg by mouth daily.   benazepril (LOTENSIN) 40 MG tablet Take 1 tablet (40 mg total) by mouth daily.   Blood Glucose Monitoring Suppl (ACCU-CHEK GUIDE) w/Device KIT CHECK GLUCOSE SIX TIMES DAILY Dx E11.9   busPIRone (BUSPAR) 7.5 MG tablet Take 1 tablet (7.5 mg total) by mouth in the morning and at bedtime. For anxiety   carbamide peroxide (DEBROX) 6.5 % OTIC solution Place 5 drops into both ears 2 (two) times daily.   cetirizine (ZYRTEC) 5 MG tablet Take 1 tablet by mouth once daily   Cholecalciferol (VITAMIN D-3 PO) Take 1,000 Units by mouth in the morning and at bedtime.   Continuous Blood Gluc Sensor (FREESTYLE LIBRE 2 SENSOR) MISC UAD to check sugars at least 4 times daily. E11.9   FLUoxetine (PROZAC) 10 MG capsule Take 1 capsule (10 mg total) by mouth daily.   fluticasone (FLONASE) 50 MCG/ACT nasal spray Place 2 sprays into both nostrils daily.   glucose blood (ACCU-CHEK GUIDE) test strip CHECK GLUCOSE SIX TIMES DAILY Dx E11.9   hydrochlorothiazide (MICROZIDE) 12.5 MG capsule Take 1 capsule (12.5 mg total) by mouth daily.   insulin degludec (TRESIBA FLEXTOUCH) 100 UNIT/ML FlexTouch Pen Inject 20-30 Units into the skin daily.   Insulin Pen Needle (NOVOFINE PLUS PEN NEEDLE) 32G  X 4 MM MISC UAD with insulin pen E11.9   LINZESS 290 MCG CAPS capsule TAKE 1 CAPSULE BY MOUTH ONCE DAILY BEFORE BREAKFAST   metFORMIN (GLUCOPHAGE) 500 MG tablet Take 2 tablets (1,000 mg total) by mouth 2 (two) times daily with a meal.   metoprolol tartrate (LOPRESSOR) 25 MG tablet Take 1 tablet (25 mg total) by mouth 2 (two) times daily.   metroNIDAZOLE (FLAGYL) 500 MG tablet Take 1 tablet (500 mg total) by mouth 2 (two) times daily.   nystatin (MYCOSTATIN/NYSTOP) powder Apply 1 Application  topically 3 (three) times daily.   nystatin cream (MYCOSTATIN) Apply 1 Application topically 2 (two) times daily. X7-10 days   omeprazole (PRILOSEC) 40 MG capsule Take 1 capsule (40 mg total) by mouth 2 (two) times daily.   ondansetron (ZOFRAN) 4 MG tablet Take 1 tablet (4 mg total) by mouth every 8 (eight) hours as needed for nausea or vomiting.   pravastatin (PRAVACHOL) 40 MG tablet Take 1 tablet (40 mg total) by mouth daily.   psyllium (METAMUCIL) 58.6 % powder Take 1 packet by mouth daily.   Semaglutide,0.25 or 0.5MG /DOS, (OZEMPIC, 0.25 OR 0.5 MG/DOSE,) 2 MG/3ML SOPN Inject 0.25 mg into the skin once a week.   valACYclovir (VALTREX) 1000 MG tablet Take 1 tablet by mouth twice daily   No facility-administered encounter medications on file as of 04/29/2023.    Allergies (verified) Sulfa antibiotics and Sulfa drugs cross reactors   History: Past Medical History:  Diagnosis Date   Diabetes mellitus    GERD (gastroesophageal reflux disease)    Hyperlipidemia    Hypertension    Osteoporosis    Past Surgical History:  Procedure Laterality Date   COLONOSCOPY N/A 03/29/2016   Dr. Darrick Penna: Redundant left colon, nonbleeding internal hemorrhoids.  Consider colonoscopy in 10 to 15 years if benefits outweigh the risk    ESOPHAGOGASTRODUODENOSCOPY N/A 03/29/2016   Dr. Darrick Penna: Small hiatal hernia, small sessile polyps found in the gastric fundus, benign fundic gland polyps, mild inflammation and erythema in the gastric antrum.  Duodenum was normal.  Biopsy negative for celiac.  Schatzki ring, patent.   TOTAL HIP ARTHROPLASTY  09/03/2011   Procedure: TOTAL HIP ARTHROPLASTY;  Surgeon: Shelda Pal, MD;  Location: [facility];  Service: Orthopedics;  Laterality: Left;   Family History  Problem Relation Age of Onset   Hypertension Mother    Hypertension Father    Diabetes Father    Hypertension Sister    Cancer Sister    Diabetes Brother    Diabetes Brother    Colon cancer Neg Hx    Social  History   Socioeconomic History   Marital status: Widowed    Spouse name: Not on file   Number of children: 2   Years of education: 13   Highest education level: 12th grade  Occupational History   Occupation: retired  Tobacco Use   Smoking status: Never   Smokeless tobacco: Never  Vaping Use   Vaping status: Never Used  Substance and Sexual Activity   Alcohol use: Yes    Comment: occasional wine   Drug use: No   Sexual activity: Not Currently    Birth control/protection: None  Other Topics Concern   Not on file  Social History Narrative   Lives alone. Daughters visit frequently   Social Determinants of Health   Financial Resource Strain: Low Risk  (04/29/2023)   Overall Financial Resource Strain (CARDIA)    Difficulty of Paying Living Expenses: Not hard at  all  Food Insecurity: No Food Insecurity (04/29/2023)   Hunger Vital Sign    Worried About Running Out of Food in the Last Year: Never true    Ran Out of Food in the Last Year: Never true  Transportation Needs: No Transportation Needs (04/29/2023)   PRAPARE - Administrator, Civil Service (Medical): No    Lack of Transportation (Non-Medical): No  Physical Activity: Insufficiently Active (04/29/2023)   Exercise Vital Sign    Days of Exercise per Week: 3 days    Minutes of Exercise per Session: 30 min  Stress: No Stress Concern Present (04/29/2023)   [employer] of Occupational Health - Occupational Stress Questionnaire    Feeling of Stress : Not at all  Social Connections: Moderately Isolated (04/29/2023)   Social Connection and Isolation Panel [NHANES]    Frequency of Communication with Friends and Family: More than three times a week    Frequency of Social Gatherings with Friends and Family: More than three times a week    Attends Religious Services: 1 to 4 times per year    Active Member of [employer] or Organizations: No    Attends Banker Meetings: Never    Marital Status: Widowed     Tobacco Counseling Counseling given: Not Answered   Clinical Intake:  Pre-visit preparation completed: Yes  Pain : No/denies pain     Nutritional Risks: None Diabetes: Yes CBG done?: No Did pt. bring in CBG monitor from home?: No  How often do you need to have someone help you when you read instructions, pamphlets, or other written materials from your doctor or pharmacy?: 1 - Never  Interpreter Needed?: No  Information entered by :: Renie Ora, LPN   Activities of Daily Living    04/29/2023   10:40 AM  In your present state of health, do you have any difficulty performing the following activities:  Hearing? 0  Vision? 0  Difficulty concentrating or making decisions? 0  Walking or climbing stairs? 0  Dressing or bathing? 0  Doing errands, shopping? 0  Preparing Food and eating ? N  Using the Toilet? N  In the past six months, have you accidently leaked urine? N  Do you have problems with loss of bowel control? N  Managing your Medications? N  Managing your Finances? N  Housekeeping or managing your Housekeeping? N    Patient Care Team: Raliegh Ip, DO as PCP - General (Family Medicine) West Bali, MD (Inactive) as Consulting Physician (Gastroenterology) Danella Maiers, [facility] (Pharmacist) Lanelle Bal, DO as Consulting Physician (Internal Medicine)  Indicate any recent Medical Services you may have received from other than [facility] providers in the past year (date may be approximate).     Assessment:   This is a routine wellness examination for Kai.  Hearing/Vision screen Vision Screening - Comments:: Wears rx glasses - up to date with routine eye exams with  Dr.Le    Goals Addressed             This Visit's Progress    DIET - INCREASE WATER INTAKE   On track    Try to drink 6-8 glasses of water daily.       Depression Screen    04/29/2023   10:39 AM 12/25/2022    4:50 PM 06/05/2022   11:04 AM 04/26/2022   10:15 AM  01/25/2022    8:11 AM 11/07/2021   10:28 AM 07/26/2021    8:09 AM  PHQ 2/9 Scores  PHQ - 2 Score 0 2 0 0 2 0 0  PHQ- 9 Score  5   8  1     Fall Risk    04/29/2023   10:37 AM 12/25/2022   10:21 AM 06/05/2022   11:04 AM 04/26/2022   10:29 AM 01/25/2022    8:11 AM  Fall Risk   Falls in the past year? 0 0 1 1 0  Number falls in past yr: 0 0 0 0 0  Injury with Fall? 0 0 0 0 0  Risk for fall due to : No Fall Risks No Fall Risks History of fall(s)    Follow up Falls prevention discussed  Education provided Falls evaluation completed Falls evaluation completed    MEDICARE RISK AT HOME: Medicare Risk at Home Any stairs in or around the home?: Yes If so, are there any without handrails?: No Home free of loose throw rugs in walkways, pet beds, electrical cords, etc?: Yes Adequate lighting in your home to reduce risk of falls?: Yes Life alert?: No Use of a cane, walker or w/c?: No Grab bars in the bathroom?: Yes Shower chair or bench in shower?: Yes Elevated toilet seat or a handicapped toilet?: Yes  TIMED UP AND GO:  Was the test performed?  No    Cognitive Function:    04/14/2018   10:38 AM  MMSE - Mini Mental State Exam  Orientation to time 5  Orientation to Place 5  Registration 3  Attention/ Calculation 5  Recall 2  Language- name 2 objects 2  Language- repeat 1  Language- follow 3 step command 3  Language- read & follow direction 1  Write a sentence 1  Copy design 1  Total score 29        04/29/2023   10:40 AM 04/26/2022   10:25 AM 04/19/2020    9:46 AM 04/19/2019    9:58 AM  6CIT Screen  What Year? 0 points 0 points 0 points 0 points  What month? 0 points 0 points 0 points 0 points  What time? 0 points 0 points 0 points 0 points  Count back from 20 0 points 0 points 0 points 0 points  Months in reverse 0 points 2 points 0 points 0 points  Repeat phrase 0 points 0 points 0 points 0 points  Total Score 0 points 2 points 0 points 0 points     Immunizations Immunization History  Administered Date(s) Administered   Fluad Quad(high Dose 65+) 04/21/2019, 05/18/2020, 05/03/2021, 06/05/2022   Fluad Trivalent(High Dose 65+) 04/07/2023   Influenza, High Dose Seasonal PF 05/05/2013, 04/08/2017, 04/14/2018   Influenza,inj,Quad PF,6+ Mos 04/13/2014   Influenza-Unspecified 04/20/2012   Moderna Sars-Covid-2 Vaccination 09/13/2020, 11/07/2020, 03/28/2021   Pneumococcal Conjugate-13 03/14/2015   Pneumococcal Polysaccharide-23 04/14/2018   Td 10/18/1996   Tdap 05/16/2012    TDAP status: Due, Education has been provided regarding the importance of this vaccine. Advised may receive this vaccine at local pharmacy or Health Dept. Aware to provide a copy of the vaccination record if obtained from local pharmacy or Health Dept. Verbalized acceptance and understanding.  Flu Vaccine status: Up to date  Pneumococcal vaccine status: Up to date  Covid-19 vaccine status: Completed vaccines  Qualifies for Shingles Vaccine? Yes   Zostavax completed No   Shingrix Completed?: No.    Education has been provided regarding the importance of this vaccine. Patient has been advised to call insurance company to determine out of pocket expense if they  have not yet received this vaccine. Advised may also receive vaccine at local pharmacy or Health Dept. Verbalized acceptance and understanding.  Screening Tests Health Maintenance  Topic Date Due   Zoster Vaccines- Shingrix (1 of 2) Never done   Cervical Cancer Screening (Pap smear)  01/12/2015   OPHTHALMOLOGY EXAM  08/19/2015   MAMMOGRAM  07/05/2021   DTaP/Tdap/Td (3 - Td or Tdap) 05/16/2022   COVID-19 Vaccine (4 - 2023-24 season) 03/09/2023   DEXA SCAN  06/06/2023 (Originally 05/18/2022)   Diabetic kidney evaluation - eGFR measurement  06/06/2023   FOOT EXAM  06/06/2023   HEMOGLOBIN A1C  06/26/2023   Diabetic kidney evaluation - Urine ACR  10/21/2023   Medicare Annual Wellness (AWV)  04/28/2024    Pneumonia Vaccine 81+ Years old  Completed   INFLUENZA VACCINE  Completed   Hepatitis C Screening  Completed   HPV VACCINES  Aged Out   Colonoscopy  Discontinued    Health Maintenance  Health Maintenance Due  Topic Date Due   Zoster Vaccines- Shingrix (1 of 2) Never done   Cervical Cancer Screening (Pap smear)  01/12/2015   OPHTHALMOLOGY EXAM  08/19/2015   MAMMOGRAM  07/05/2021   DTaP/Tdap/Td (3 - Td or Tdap) 05/16/2022   COVID-19 Vaccine (4 - 2023-24 season) 03/09/2023    Colorectal cancer screening: No longer required.   Mammogram status: No longer required due to age.  Bone Density status: Ordered declined . Pt provided with contact info and advised to call to schedule appt.  Lung Cancer Screening: (Low Dose CT Chest recommended if Age 36-80 years, 20 pack-year currently smoking OR have quit w/in 15years.) does not qualify.   Lung Cancer Screening Referral: n/a  Additional Screening:  Hepatitis C Screening: does not qualify; Completed 06/29/2015  Vision Screening: Recommended annual ophthalmology exams for early detection of glaucoma and other disorders of the eye. Is the patient up to date with their annual eye exam?  Yes  Who is the provider or what is the name of the office in which the patient attends annual eye exams? Dr Conley Rolls  If pt is not established with a provider, would they like to be referred to a provider to establish care? No .   Dental Screening: Recommended annual dental exams for proper oral hygiene  Diabetic Foot Exam: Diabetic Foot Exam: Overdue, Pt has been advised about the importance in completing this exam. Pt is scheduled for diabetic foot exam on next office visit .  Community Resource Referral / Chronic Care Management: CRR required this visit?  No   CCM required this visit?  No     Plan:     I have personally reviewed and noted the following in the patient's chart:   Medical and social history Use of alcohol, tobacco or illicit drugs   Current medications and supplements including opioid prescriptions. Patient is not currently taking opioid prescriptions. Functional ability and status Nutritional status Physical activity Advanced directives List of other physicians Hospitalizations, surgeries, and ER visits in previous 12 months Vitals Screenings to include cognitive, depression, and falls Referrals and appointments  In addition, I have reviewed and discussed with patient certain preventive protocols, quality metrics, and best practice recommendations. A written personalized care plan for preventive services as well as general preventive health recommendations were provided to patient.     Lorrene Reid, LPN   [id number]/04/[id number]   After Visit Summary: (MyChart) Due to this being a telephonic visit, the after visit summary with patients personalized plan  was offered to patient via MyChart   Nurse Notes: none

## 2023-04-29 NOTE — Patient Instructions (Signed)
Susan Petty , Thank you for taking time to come for your Medicare Wellness Visit. I appreciate your ongoing commitment to your health goals. Please review the following plan we discussed and let me know if I can assist you in the future.   Referrals/Orders/Follow-Ups/Clinician Recommendations: Aim for 30 minutes of exercise or brisk walking, 6-8 glasses of water, and 5 servings of fruits and vegetables each day.   This is a list of the screening recommended for you and due dates:  Health Maintenance  Topic Date Due   Zoster (Shingles) Vaccine (1 of 2) Never done   Pap Smear  01/12/2015   Eye exam for diabetics  08/19/2015   Mammogram  07/05/2021   DTaP/Tdap/Td vaccine (3 - Td or Tdap) 05/16/2022   COVID-19 Vaccine (4 - 2023-24 season) 03/09/2023   DEXA scan (bone density measurement)  06/06/2023*   Yearly kidney function blood test for diabetes  06/06/2023   Complete foot exam   06/06/2023   Hemoglobin A1C  06/26/2023   Yearly kidney health urinalysis for diabetes  10/21/2023   Medicare Annual Wellness Visit  04/28/2024   Pneumonia Vaccine  Completed   Flu Shot  Completed   Hepatitis C Screening  Completed   HPV Vaccine  Aged Out   Colon Cancer Screening  Discontinued  *Topic was postponed. The date shown is not the original due date.    Advanced directives: (Provided) Advance directive discussed with you today. I have provided a copy for you to complete at home and have notarized. Once this is complete, please bring a copy in to our office so we can scan it into your chart. Information on Advanced Care Planning can be found at [facility] Advance Health Care Directives Advance Health Care Directives ([email or web address]    Next Medicare Annual Wellness Visit scheduled for next year: Yes  Insert Preventive Care attachment Insert FALL PREVENTION attachment if needed

## 2023-05-09 ENCOUNTER — Ambulatory Visit: Payer: Medicare Other | Admitting: Family Medicine

## 2023-05-14 ENCOUNTER — Ambulatory Visit: Payer: Medicare Other | Admitting: Family Medicine

## 2023-05-14 VITALS — BP 136/54 | HR 64 | Temp 97.8°F | Ht 65.0 in | Wt 142.2 lb

## 2023-05-14 DIAGNOSIS — R300 Dysuria: Secondary | ICD-10-CM | POA: Diagnosis not present

## 2023-05-14 LAB — URINALYSIS, ROUTINE W REFLEX MICROSCOPIC
Bilirubin, UA: NEGATIVE
Ketones, UA: NEGATIVE
Leukocytes,UA: NEGATIVE
Nitrite, UA: NEGATIVE
Protein,UA: NEGATIVE
RBC, UA: NEGATIVE
Specific Gravity, UA: 1.02 (ref 1.005–1.030)
Urobilinogen, Ur: 0.2 mg/dL (ref 0.2–1.0)
pH, UA: 6.5 (ref 5.0–7.5)

## 2023-05-14 LAB — MICROSCOPIC EXAMINATION
Bacteria, UA: NONE SEEN
Epithelial Cells (non renal): NONE SEEN /[HPF] (ref 0–10)
RBC, Urine: NONE SEEN /[HPF] (ref 0–2)
Renal Epithel, UA: NONE SEEN /[HPF]
Yeast, UA: NONE SEEN

## 2023-05-14 NOTE — Progress Notes (Signed)
   Acute Office Visit  Subjective:     Patient ID: Susan Petty, female    DOB: [birth year], 77 y.o.   MRN: [id number]  Chief Complaint  Patient presents with   Dysuria    Dysuria  This is a new problem. The current episode started 1 to 4 weeks ago. The problem occurs intermittently. The problem has been waxing and waning. The quality of the pain is described as aching. There has been no fever. Associated symptoms include frequency and urgency. Pertinent negatives include no chills, discharge, hematuria, nausea or vomiting. She has tried antibiotics for the symptoms. The treatment provided moderate relief. There is no history of recurrent UTIs.    Review of Systems  Constitutional:  Negative for chills.  Gastrointestinal:  Negative for nausea and vomiting.  Genitourinary:  Positive for dysuria, frequency and urgency. Negative for hematuria.        Objective:    BP (!) 136/54   Pulse 64   Temp 97.8 F (36.6 C) (Temporal)   Ht 5\' 5"  (1.651 m)   Wt 142 lb 4 oz (64.5 kg)   SpO2 98%   BMI 23.67 kg/m    Physical Exam Vitals and nursing note reviewed.  Constitutional:      General: She is not in acute distress.    Appearance: Normal appearance. She is not ill-appearing, toxic-appearing or diaphoretic.  Pulmonary:     Effort: Pulmonary effort is normal. No respiratory distress.  Abdominal:     General: Bowel sounds are normal. There is no distension.     Palpations: Abdomen is soft.     Tenderness: There is no abdominal tenderness. There is no right CVA tenderness, left CVA tenderness, guarding or rebound.  Musculoskeletal:     Right lower leg: No edema.     Left lower leg: No edema.  Skin:    General: Skin is warm and dry.  Neurological:     General: No focal deficit present.     Mental Status: She is alert and oriented to person, place, and time.  Psychiatric:        Mood and Affect: Mood normal.        Behavior: Behavior normal.     Urine dipstick shows  positive for glucose.  Micro exam: 0-5 WBC's per HPF.       Assessment & Plan:   Susan Petty was seen today for dysuria.  Diagnoses and all orders for this visit:  Dysuria Urine essentially normal. Discussed pushing fluids, AZO OTC pending urine culture. Will treat empirically however if symptoms worsen in the meantime.  -     Urinalysis, Routine w reflex microscopic -     Urine Culture   Return to office for new or worsening symptoms, or if symptoms persist.   The patient indicates understanding of these issues and agrees with the plan.  Gabriel Earing, FNP

## 2023-05-14 NOTE — Patient Instructions (Signed)
AZO over the counter for urinary pain

## 2023-05-15 LAB — URINE CULTURE

## 2023-05-21 ENCOUNTER — Ambulatory Visit: Payer: Medicare Other | Admitting: Family Medicine

## 2023-05-21 ENCOUNTER — Encounter: Payer: Self-pay | Admitting: Family Medicine

## 2023-05-21 VITALS — BP 164/52 | HR 54 | Temp 97.6°F | Ht 65.0 in | Wt 142.5 lb

## 2023-05-21 DIAGNOSIS — H60502 Unspecified acute noninfective otitis externa, left ear: Secondary | ICD-10-CM

## 2023-05-21 MED ORDER — OFLOXACIN 0.3 % OT SOLN: 10.0000 [drp] | Freq: Every day | OTIC | 0 refills | Status: AC

## 2023-05-21 MED ORDER — OFLOXACIN 0.3 % OT SOLN: 10.0000 [drp] | Freq: Every day | OTIC | 0 refills | Status: DC

## 2023-05-21 NOTE — Patient Instructions (Signed)
Otitis Externa  Otitis externa is an infection of the outer ear canal. The outer ear canal is the area between the outside of the ear and the eardrum. Otitis externa is sometimes called swimmer's ear. What are the causes? Common causes of this condition include: Swimming in dirty water. Moisture in the ear. An injury to the inside of the ear. An object stuck in the ear. A cut or scrape on the outside of the ear or in the ear canal. What increases the risk? You are more likely to develop this condition if you go swimming often. What are the signs or symptoms? The first symptom of this condition is often itching in the ear. Later symptoms of the condition include: Swelling of the ear. Redness in the ear. Ear pain. The pain may get worse when you pull on your ear. Pus coming from the ear. How is this diagnosed? This condition may be diagnosed by examining the ear and testing fluid from the ear for bacteria and funguses. How is this treated? This condition may be treated with: Antibiotic ear drops. These are often given for 10-14 days. Medicines to reduce itching and swelling. Follow these instructions at home: If you were prescribed antibiotic ear drops, use them as told by your health care provider. Do not stop using the antibiotic even if you start to feel better. Take over-the-counter and prescription medicines only as told by your health care provider. Avoid getting water in your ears as told by your health care provider. This may include avoiding swimming or water sports for a few days. Keep all follow-up visits. This is important. How is this prevented? Keep your ears dry. Use the corner of a towel to dry your ears after you swim or bathe. Avoid scratching or putting things in your ear. Doing these things can damage the ear canal or remove the protective wax that lines it, which makes it easier for bacteria and funguses to grow. Avoid swimming in lakes, polluted water, or swimming  pools that may not have enough chlorine. Contact a health care provider if: You have a fever. Your ear is still red, swollen, painful, or draining pus after 3 days. Your redness, swelling, or pain gets worse. You have a severe headache. Get help right away if: You have redness, swelling, and pain or tenderness in the area behind your ear. Summary Otitis externa is an infection of the outer ear canal. Common causes include swimming in dirty water, moisture in the ear, or a cut or scrape in the ear. Symptoms include pain, redness, and swelling of the ear canal. If you were prescribed antibiotic ear drops, use them as told by your health care provider. Do not stop using the antibiotic even if you start to feel better. This information is not intended to replace advice given to you by your health care provider. Make sure you discuss any questions you have with your health care provider. Document Revised: 09/06/2020 Document Reviewed: 09/06/2020 Elsevier Patient Education  2024 [employer]  

## 2023-05-21 NOTE — Progress Notes (Signed)
   Acute Office Visit  Subjective:     Patient ID: Susan Petty, female    DOB: 1945/11/09, 77 y.o.   MRN: [id number]  Chief Complaint  Patient presents with   Otalgia    Otalgia  There is pain in the left ear. This is a new problem. The current episode started in the past 7 days. The problem occurs hourly. The problem has been waxing and waning. There has been no fever. Associated symptoms include coughing, headaches and rhinorrhea. Pertinent negatives include no abdominal pain, diarrhea, ear discharge, hearing loss, neck pain, rash, sore throat or vomiting.   Outer ear has been sore to the touch and red.   Review of Systems  HENT:  Positive for ear pain and rhinorrhea. Negative for ear discharge, hearing loss and sore throat.   Respiratory:  Positive for cough.   Gastrointestinal:  Negative for abdominal pain, diarrhea and vomiting.  Musculoskeletal:  Negative for neck pain.  Skin:  Negative for rash.  Neurological:  Positive for headaches.        Objective:    BP (!) 164/52   Pulse (!) 54   Temp 97.6 F (36.4 C) (Temporal)   Ht 5\' 5"  (1.651 m)   Wt 142 lb 8 oz (64.6 kg)   SpO2 98%   BMI 23.71 kg/m    Physical Exam Vitals and nursing note reviewed.  Constitutional:      General: She is not in acute distress.    Appearance: She is not ill-appearing, toxic-appearing or diaphoretic.  HENT:     Right Ear: Tympanic membrane, ear canal and external ear normal.     Left Ear: Tympanic membrane normal. No drainage.     Ears:     Comments: Mild warmth and tenderness to left external ear with mild swelling of left ear canal. Cardiovascular:     Rate and Rhythm: Normal rate and regular rhythm.     Heart sounds: Normal heart sounds. No murmur heard. Musculoskeletal:     Cervical back: Neck supple. No rigidity.  Lymphadenopathy:     Cervical: No cervical adenopathy.  Skin:    General: Skin is dry.  Neurological:     Mental Status: She is alert and oriented to person,  place, and time. Mental status is at baseline.  Psychiatric:        Mood and Affect: Mood normal.        Behavior: Behavior normal.     No results found for any visits on 05/21/23.      Assessment & Plan:   Susan Petty was seen today for otalgia.  Diagnoses and all orders for this visit:  Acute otitis externa of left ear, unspecified type Ofloxacin as below.  -     ofloxacin (FLOXIN) 0.3 % OTIC solution; Place 10 drops into the left ear daily for 7 days.   Return to office for new or worsening symptoms, or if symptoms persist.   The patient indicates understanding of these issues and agrees with the plan.  Gabriel Earing, FNP

## 2023-05-28 ENCOUNTER — Other Ambulatory Visit: Payer: Self-pay | Admitting: Family Medicine

## 2023-05-28 DIAGNOSIS — F411 Generalized anxiety disorder: Secondary | ICD-10-CM

## 2023-06-06 ENCOUNTER — Other Ambulatory Visit: Payer: Self-pay | Admitting: Family Medicine

## 2023-06-06 DIAGNOSIS — E119 Type 2 diabetes mellitus without complications: Secondary | ICD-10-CM

## 2023-06-08 ENCOUNTER — Other Ambulatory Visit: Payer: Self-pay | Admitting: Family Medicine

## 2023-07-20 ENCOUNTER — Other Ambulatory Visit: Payer: Self-pay | Admitting: Family Medicine

## 2023-07-20 DIAGNOSIS — E1169 Type 2 diabetes mellitus with other specified complication: Secondary | ICD-10-CM

## 2023-07-20 DIAGNOSIS — I152 Hypertension secondary to endocrine disorders: Secondary | ICD-10-CM

## 2023-08-04 ENCOUNTER — Ambulatory Visit: Payer: Medicare Other | Admitting: Family Medicine

## 2023-08-06 ENCOUNTER — Ambulatory Visit (INDEPENDENT_AMBULATORY_CARE_PROVIDER_SITE_OTHER): Payer: Medicare Other | Admitting: Family Medicine

## 2023-08-06 ENCOUNTER — Encounter: Payer: Self-pay | Admitting: Family Medicine

## 2023-08-06 VITALS — BP 139/52 | HR 68 | Temp 98.4°F | Ht 65.0 in | Wt 142.8 lb

## 2023-08-06 DIAGNOSIS — E1169 Type 2 diabetes mellitus with other specified complication: Secondary | ICD-10-CM | POA: Diagnosis not present

## 2023-08-06 DIAGNOSIS — I152 Hypertension secondary to endocrine disorders: Secondary | ICD-10-CM

## 2023-08-06 DIAGNOSIS — M810 Age-related osteoporosis without current pathological fracture: Secondary | ICD-10-CM

## 2023-08-06 DIAGNOSIS — E1159 Type 2 diabetes mellitus with other circulatory complications: Secondary | ICD-10-CM

## 2023-08-06 DIAGNOSIS — E119 Type 2 diabetes mellitus without complications: Secondary | ICD-10-CM

## 2023-08-06 DIAGNOSIS — E785 Hyperlipidemia, unspecified: Secondary | ICD-10-CM

## 2023-08-06 DIAGNOSIS — Z794 Long term (current) use of insulin: Secondary | ICD-10-CM | POA: Diagnosis not present

## 2023-08-06 LAB — BAYER DCA HB A1C WAIVED: HB A1C (BAYER DCA - WAIVED): 8.3 % — ABNORMAL HIGH (ref 4.8–5.6)

## 2023-08-06 MED ORDER — FREESTYLE LIBRE 3 PLUS SENSOR MISC: 3 refills | Status: DC

## 2023-08-06 MED ORDER — EMPAGLIFLOZIN 25 MG PO TABS: 25.0000 mg | ORAL_TABLET | Freq: Every day | ORAL | 3 refills | Status: DC

## 2023-08-06 MED ORDER — FREESTYLE LIBRE 3 READER DEVI: 0 refills | Status: DC

## 2023-08-06 NOTE — Progress Notes (Signed)
Subjective: CC:DM PCP: Raliegh Ip, DO [id number] Susan Petty is a 78 y.o. female presenting to clinic today for:  1. Type 2 Diabetes with hypertension, hyperlipidemia:  Patient compliant with all medications.  Currently injecting 18 units of Tresiba.  Never did go on the Ozempic because she was scared to lose too much weight and look old.  She is been trying to lose weight naturally.  She does report some neuropathic pain in the tips of her fingers sometimes.  Her blood sugars have been running as high as 300s.  She uses the [place] and fingerstick for confirmation.  She notes that sometimes she will have low blood sugars into the 50s and 60s in the morning even if she goes to bed with 200s.  Diabetes Health Maintenance Due  Topic Date Due   OPHTHALMOLOGY EXAM  08/19/2015   FOOT EXAM  06/06/2023   HEMOGLOBIN A1C  06/26/2023    Last A1c:  Lab Results  Component Value Date   HGBA1C 7.9 (H) 12/25/2022    ROS: Denies dizziness, LOC, polyuria, polydipsia, unintended weight loss/gain, foot ulcerations, numbness or tingling in extremities, shortness of breath or chest pain.   ROS: Per HPI  Allergies  Allergen Reactions   Sulfa Antibiotics    Sulfa Drugs Cross Reactors Rash   Past Medical History:  Diagnosis Date   Diabetes mellitus    GERD (gastroesophageal reflux disease)    Hyperlipidemia    Hypertension    Osteoporosis     Current Outpatient Medications:    Accu-Chek Softclix Lancets lancets, SMARTSIG:Topical 6 Times Daily, Disp: , Rfl:    alendronate (FOSAMAX) 70 MG tablet, TAKE 1 TABLET BY MOUTH ONCE A WEEK WITH  A  FULL  GLASS  OF  WATER  ON  AN  EMPTY  STOMACH, Disp: 12 tablet, Rfl: 3   amLODipine (NORVASC) 10 MG tablet, Take 1 tablet (10 mg total) by mouth daily., Disp: 90 tablet, Rfl: 3   aspirin EC 81 MG tablet, Take 81 mg by mouth daily., Disp: , Rfl:    benazepril (LOTENSIN) 40 MG tablet, TAKE 1 TABLET BY MOUTH EVERY DAY, Disp: 90 tablet, Rfl: 0   Blood  Glucose Monitoring Suppl (ACCU-CHEK GUIDE) w/Device KIT, CHECK GLUCOSE SIX TIMES DAILY Dx E11.9, Disp: 1 kit, Rfl: 0   busPIRone (BUSPAR) 7.5 MG tablet, TAKE 1 TABLET (7.5 MG TOTAL) BY MOUTH IN THE MORNING AND AT BEDTIME. FOR ANXIETY, Disp: 180 tablet, Rfl: 0   carbamide peroxide (DEBROX) 6.5 % OTIC solution, Place 5 drops into both ears 2 (two) times daily., Disp: 15 mL, Rfl: 0   cetirizine (ZYRTEC) 5 MG tablet, Take 1 tablet by mouth once daily, Disp: 30 tablet, Rfl: 0   Cholecalciferol (VITAMIN D-3 PO), Take 1,000 Units by mouth in the morning and at bedtime., Disp: , Rfl:    Continuous Blood Gluc Sensor (FREESTYLE LIBRE 2 SENSOR) MISC, UAD to check sugars at least 4 times daily. E11.9, Disp: 6 each, Rfl: 4   FLUoxetine (PROZAC) 10 MG capsule, TAKE 1 CAPSULE BY MOUTH EVERY DAY, Disp: 90 capsule, Rfl: 0   fluticasone (FLONASE) 50 MCG/ACT nasal spray, Place 2 sprays into both nostrils daily., Disp: 16 g, Rfl: 6   glucose blood (ACCU-CHEK GUIDE) test strip, CHECK GLUCOSE SIX TIMES DAILY Dx E11.9, Disp: 600 each, Rfl: 3   hydrochlorothiazide (MICROZIDE) 12.5 MG capsule, TAKE 1 CAPSULE BY MOUTH EVERY DAY, Disp: 90 capsule, Rfl: 0   insulin degludec (TRESIBA FLEXTOUCH) 100 UNIT/ML FlexTouch  Pen, Inject 20-30 Units into the skin daily., Disp: 30 mL, Rfl: 3   Insulin Pen Needle (BD PEN NEEDLE NANO 2ND GEN) 32G X 4 MM MISC, Use daily with insulin pen Dx E11.9, Disp: 100 each, Rfl: 3   LINZESS 290 MCG CAPS capsule, TAKE 1 CAPSULE BY MOUTH ONCE DAILY BEFORE BREAKFAST, Disp: 30 capsule, Rfl: 11   metFORMIN (GLUCOPHAGE) 500 MG tablet, Take 2 tablets (1,000 mg total) by mouth 2 (two) times daily with a meal., Disp: 360 tablet, Rfl: 3   metoprolol tartrate (LOPRESSOR) 25 MG tablet, TAKE 1 TABLET BY MOUTH TWICE A DAY, Disp: 180 tablet, Rfl: 0   nystatin (MYCOSTATIN/NYSTOP) powder, Apply 1 Application topically 3 (three) times daily., Disp: 60 g, Rfl: 1   nystatin cream (MYCOSTATIN), Apply 1 Application topically  2 (two) times daily. X7-10 days, Disp: 30 g, Rfl: 0   omeprazole (PRILOSEC) 40 MG capsule, TAKE 1 CAPSULE BY MOUTH TWICE A DAY, Disp: 180 capsule, Rfl: 0   ondansetron (ZOFRAN) 4 MG tablet, Take 1 tablet (4 mg total) by mouth every 8 (eight) hours as needed for nausea or vomiting., Disp: 20 tablet, Rfl: 0   pravastatin (PRAVACHOL) 40 MG tablet, TAKE 1 TABLET BY MOUTH EVERY DAY, Disp: 90 tablet, Rfl: 0   psyllium (METAMUCIL) 58.6 % powder, Take 1 packet by mouth daily., Disp: , Rfl:    Semaglutide,0.25 or 0.5MG /DOS, (OZEMPIC, 0.25 OR 0.5 MG/DOSE,) 2 MG/3ML SOPN, Inject 0.25 mg into the skin once a week., Disp: , Rfl:    valACYclovir (VALTREX) 1000 MG tablet, Take 1 tablet by mouth twice daily, Disp: 20 tablet, Rfl: 0 Social History   Socioeconomic History   Marital status: Widowed    Spouse name: Not on file   Number of children: 2   Years of education: 66   Highest education level: 12th grade  Occupational History   Occupation: retired  Tobacco Use   Smoking status: Never   Smokeless tobacco: Never  Vaping Use   Vaping status: Never Used  Substance and Sexual Activity   Alcohol use: Yes    Comment: occasional wine   Drug use: No   Sexual activity: Not Currently    Birth control/protection: None  Other Topics Concern   Not on file  Social History Narrative   Lives alone. Daughters visit frequently   Social Drivers of Health   Financial Resource Strain: Low Risk  (05/14/2023)   Overall Financial Resource Strain (CARDIA)    Difficulty of Paying Living Expenses: Not hard at all  Food Insecurity: No Food Insecurity (05/14/2023)   Hunger Vital Sign    Worried About Running Out of Food in the Last Year: Never true    Ran Out of Food in the Last Year: Never true  Transportation Needs: No Transportation Needs (05/14/2023)   PRAPARE - Administrator, Civil Service (Medical): No    Lack of Transportation (Non-Medical): No  Physical Activity: Unknown (05/14/2023)   Exercise  Vital Sign    Days of Exercise per Week: 3 days    Minutes of Exercise per Session: Patient declined  Recent Concern: Physical Activity - Insufficiently Active (04/29/2023)   Exercise Vital Sign    Days of Exercise per Week: 3 days    Minutes of Exercise per Session: 30 min  Stress: No Stress Concern Present (05/14/2023)   [employer] of Occupational Health - Occupational Stress Questionnaire    Feeling of Stress : Not at all  Social Connections: Moderately Isolated (  05/14/2023)   Social Connection and Isolation Panel [NHANES]    Frequency of Communication with Friends and Family: More than three times a week    Frequency of Social Gatherings with Friends and Family: More than three times a week    Attends Religious Services: More than 4 times per year    Active Member of [employer] or Organizations: No    Attends Banker Meetings: Never    Marital Status: Widowed  Intimate Partner Violence: Not At Risk (04/29/2023)   Humiliation, Afraid, Rape, and Kick questionnaire    Fear of Current or Ex-Partner: No    Emotionally Abused: No    Physically Abused: No    Sexually Abused: No   Family History  Problem Relation Age of Onset   Hypertension Mother    Hypertension Father    Diabetes Father    Hypertension Sister    Cancer Sister    Diabetes Brother    Diabetes Brother    Colon cancer Neg Hx     Objective: Office vital signs reviewed. BP (!) 139/52   Pulse 68   Temp 98.4 F (36.9 C)   Ht 5\' 5"  (1.651 m)   Wt 142 lb 12.8 oz (64.8 kg)   SpO2 91%   BMI 23.76 kg/m   Physical Examination:  General: Awake, alert, well nourished, No acute distress HEENT: sclera white, MMM Cardio: regular rate and rhythm, S1S2 heard, no murmurs appreciated Pulm: clear to auscultation bilaterally, no wheezes, rhonchi or rales; normal work of breathing on room air   Diabetic Foot Exam - Simple   Simple Foot Form Diabetic Foot exam was performed with the following findings: Yes  08/06/2023  4:55 PM  Visual Inspection No deformities, no ulcerations, no other skin breakdown bilaterally: Yes Sensation Testing Intact to touch and monofilament testing bilaterally: Yes Pulse Check Posterior Tibialis and Dorsalis pulse intact bilaterally: Yes Comments Onychomycotic changes to the nails bilaterally      Assessment/ Plan: 78 y.o. female   Type 2 diabetes mellitus with insulin therapy (HCC) - Plan: Microalbumin / creatinine urine ratio, Bayer DCA Hb A1c Waived, CMP14+EGFR, empagliflozin (JARDIANCE) 25 MG TABS tablet, Continuous Glucose Sensor (FREESTYLE LIBRE 3 PLUS SENSOR) MISC, Continuous Glucose Receiver (FREESTYLE LIBRE 3 READER) DEVI  Hyperlipidemia associated with type 2 diabetes mellitus (HCC) - Plan: CMP14+EGFR, Lipid Panel  Hypertension associated with type 2 diabetes mellitus (HCC) - Plan: CMP14+EGFR  Age-related osteoporosis without current pathological fracture - Plan: CMP14+EGFR, VITAMIN D 25 Hydroxy (Vit-D Deficiency, Fractures), CANCELED: DG [facility] DEXA  I have upgraded her to the freestyle libre 3+ since the 2 is getting phased out.  I am going to trial her on Jardiance.  She can back down on the long-acting insulin to 16 units for now.  If given her 2 weeks of the 10 mg and 2 weeks of the 25 mg as samples and sent over 25 mg to the pharmacy.  If she needs patient assistance with this, I will have her see Raynelle Fanning again to see if we can get her qualified.  Lipid panel, CMP, vitamin D collected today.  She did not want have DEXA done today.  She will continue current medications as prescribed for blood pressure and cholesterol  Effa Yarrow Hulen Skains, DO [facility] [phone]

## 2023-08-07 LAB — CMP14+EGFR
ALT: 10 [IU]/L (ref 0–32)
AST: 18 [IU]/L (ref 0–40)
Albumin: 4.1 g/dL (ref 3.8–4.8)
Alkaline Phosphatase: 78 [IU]/L (ref 44–121)
BUN/Creatinine Ratio: 19 (ref 12–28)
BUN: 16 mg/dL (ref 8–27)
Bilirubin Total: 0.5 mg/dL (ref 0.0–1.2)
CO2: 25 mmol/L (ref 20–29)
Calcium: 9.5 mg/dL (ref 8.7–10.3)
Chloride: 97 mmol/L (ref 96–106)
Creatinine, Ser: 0.85 mg/dL (ref 0.57–1.00)
Globulin, Total: 2.6 g/dL (ref 1.5–4.5)
Glucose: 276 mg/dL — ABNORMAL HIGH (ref 70–99)
Potassium: 4.4 mmol/L (ref 3.5–5.2)
Sodium: 138 mmol/L (ref 134–144)
Total Protein: 6.7 g/dL (ref 6.0–8.5)
eGFR: 70 mL/min/{1.73_m2} (ref >59)

## 2023-08-07 LAB — LIPID PANEL
Chol/HDL Ratio: 2.1 {ratio} (ref 0.0–4.4)
Cholesterol, Total: 175 mg/dL (ref 100–199)
HDL: 83 mg/dL (ref >39)
LDL Chol Calc (NIH): 79 mg/dL (ref 0–99)
Triglycerides: 69 mg/dL (ref 0–149)
VLDL Cholesterol Cal: 13 mg/dL (ref 5–40)

## 2023-08-07 LAB — MICROALBUMIN / CREATININE URINE RATIO
Creatinine, Urine: 110.7 mg/dL
Microalb/Creat Ratio: 17 mg/g{creat} (ref 0–29)
Microalbumin, Urine: 18.3 ug/mL

## 2023-08-07 LAB — VITAMIN D 25 HYDROXY (VIT D DEFICIENCY, FRACTURES): Vit D, 25-Hydroxy: 61.1 ng/mL (ref 30.0–100.0)

## 2023-08-08 ENCOUNTER — Encounter: Payer: Self-pay | Admitting: Family Medicine

## 2023-08-15 ENCOUNTER — Other Ambulatory Visit: Payer: Self-pay | Admitting: Family Medicine

## 2023-08-15 DIAGNOSIS — E1159 Type 2 diabetes mellitus with other circulatory complications: Secondary | ICD-10-CM

## 2023-09-01 ENCOUNTER — Other Ambulatory Visit: Payer: Self-pay | Admitting: Family Medicine

## 2023-09-01 DIAGNOSIS — F411 Generalized anxiety disorder: Secondary | ICD-10-CM

## 2023-09-02 DIAGNOSIS — C44519 Basal cell carcinoma of skin of other part of trunk: Secondary | ICD-10-CM | POA: Diagnosis not present

## 2023-09-02 DIAGNOSIS — X32XXXD Exposure to sunlight, subsequent encounter: Secondary | ICD-10-CM | POA: Diagnosis not present

## 2023-09-02 DIAGNOSIS — L570 Actinic keratosis: Secondary | ICD-10-CM | POA: Diagnosis not present

## 2023-09-02 DIAGNOSIS — L821 Other seborrheic keratosis: Secondary | ICD-10-CM | POA: Diagnosis not present

## 2023-09-03 ENCOUNTER — Other Ambulatory Visit: Payer: Self-pay | Admitting: Family Medicine

## 2023-09-03 DIAGNOSIS — E119 Type 2 diabetes mellitus without complications: Secondary | ICD-10-CM

## 2023-09-10 LAB — OPHTHALMOLOGY REPORT-SCANNED

## 2023-09-14 ENCOUNTER — Other Ambulatory Visit: Payer: Self-pay | Admitting: Family Medicine

## 2023-09-18 ENCOUNTER — Other Ambulatory Visit: Payer: Self-pay | Admitting: Family Medicine

## 2023-09-18 DIAGNOSIS — E119 Type 2 diabetes mellitus without complications: Secondary | ICD-10-CM

## 2023-10-22 ENCOUNTER — Other Ambulatory Visit: Payer: Self-pay | Admitting: Family Medicine

## 2023-10-22 DIAGNOSIS — E119 Type 2 diabetes mellitus without complications: Secondary | ICD-10-CM

## 2023-10-24 ENCOUNTER — Other Ambulatory Visit: Payer: Self-pay | Admitting: Family Medicine

## 2023-10-24 DIAGNOSIS — E1159 Type 2 diabetes mellitus with other circulatory complications: Secondary | ICD-10-CM

## 2023-10-24 DIAGNOSIS — E1169 Type 2 diabetes mellitus with other specified complication: Secondary | ICD-10-CM

## 2023-10-27 ENCOUNTER — Encounter: Payer: Self-pay | Admitting: Family Medicine

## 2023-10-27 NOTE — Telephone Encounter (Signed)
 Copied from CRM [id number]. Topic: General - Other >> Oct 24, 2023  9:01 AM Tiffany S wrote: Reason for CRM: Patient is requesting call back from Dr Bonnell Butcher or her nurse  regarding injection

## 2023-11-03 ENCOUNTER — Encounter: Payer: Self-pay | Admitting: Family Medicine

## 2023-11-03 ENCOUNTER — Ambulatory Visit (INDEPENDENT_AMBULATORY_CARE_PROVIDER_SITE_OTHER): Admitting: Family Medicine

## 2023-11-03 VITALS — BP 130/80 | HR 64 | Temp 98.4°F | Ht 65.0 in | Wt 138.0 lb

## 2023-11-03 DIAGNOSIS — I152 Hypertension secondary to endocrine disorders: Secondary | ICD-10-CM | POA: Diagnosis not present

## 2023-11-03 DIAGNOSIS — E1159 Type 2 diabetes mellitus with other circulatory complications: Secondary | ICD-10-CM | POA: Diagnosis not present

## 2023-11-03 DIAGNOSIS — E785 Hyperlipidemia, unspecified: Secondary | ICD-10-CM

## 2023-11-03 DIAGNOSIS — M25562 Pain in left knee: Secondary | ICD-10-CM | POA: Diagnosis not present

## 2023-11-03 DIAGNOSIS — E1169 Type 2 diabetes mellitus with other specified complication: Secondary | ICD-10-CM | POA: Diagnosis not present

## 2023-11-03 DIAGNOSIS — E119 Type 2 diabetes mellitus without complications: Secondary | ICD-10-CM

## 2023-11-03 DIAGNOSIS — Z794 Long term (current) use of insulin: Secondary | ICD-10-CM | POA: Diagnosis not present

## 2023-11-03 LAB — BAYER DCA HB A1C WAIVED: HB A1C (BAYER DCA - WAIVED): 8.2 % — ABNORMAL HIGH (ref 4.8–5.6)

## 2023-11-03 MED ORDER — OZEMPIC (0.25 OR 0.5 MG/DOSE) 2 MG/3ML ~~LOC~~ SOPN: 0.2500 mg | PEN_INJECTOR | SUBCUTANEOUS | 2 refills | Status: DC

## 2023-11-03 MED ORDER — NAPROXEN 500 MG PO TABS: 500.0000 mg | ORAL_TABLET | Freq: Two times a day (BID) | ORAL | 1 refills | Status: DC

## 2023-11-03 NOTE — Progress Notes (Signed)
 Subjective: CC:DM PCP: Susan Guerin, DO [id number] Susan Petty is a 78 y.o. female presenting to clinic today for:  1. Type 2 Diabetes with hypertension, hyperlipidemia:  Patient currently utilizing freestyle libre 2 for monitoring of glucose.  She has been prescribed the 3+ but has not started it yet.  She notes that her blood sugars have been running on average 170s.  No hypoglycemic episodes.  She is tolerating the Jardiance  without any difficulty and denies any vaginal symptoms.  She continues to inject Tresiba  as prescribed and rarely her Novolin for as needed use.  Diabetes Health Maintenance Due  Topic Date Due   OPHTHALMOLOGY EXAM  08/19/2015   HEMOGLOBIN A1C  02/03/2024   FOOT EXAM  08/05/2024    Last A1c:  Lab Results  Component Value Date   HGBA1C 8.3 (H) 08/06/2023   2.  Bilateral knee swelling She reports bilateral knee swelling but actually has some left medial inferior knee pain as well.  No preceding injury.  No buckling.  She feels like it has little warmth to it.  Has used ibuprofen, heat, ice but nothing is really helping.  She is wanting an injection   ROS: Per HPI  Allergies  Allergen Reactions   Sulfa Antibiotics    Sulfa Drugs Cross Reactors Rash   Past Medical History:  Diagnosis Date   Diabetes mellitus    GERD (gastroesophageal reflux disease)    Hyperlipidemia    Hypertension    Osteoporosis     Current Outpatient Medications:    Accu-Chek Softclix Lancets lancets, SMARTSIG:Topical 6 Times Daily, Disp: , Rfl:    alendronate  (FOSAMAX ) 70 MG tablet, TAKE 1 TABLET BY MOUTH ONCE A WEEK WITH  A  FULL  GLASS  OF  WATER   ON  AN  EMPTY  STOMACH, Disp: 12 tablet, Rfl: 3   amLODipine  (NORVASC ) 10 MG tablet, TAKE 1 TABLET BY MOUTH EVERY DAY, Disp: 90 tablet, Rfl: 0   aspirin  EC 81 MG tablet, Take 81 mg by mouth daily., Disp: , Rfl:    benazepril  (LOTENSIN ) 40 MG tablet, TAKE 1 TABLET BY MOUTH EVERY DAY, Disp: 90 tablet, Rfl: 0   Blood Glucose  Monitoring Suppl (ACCU-CHEK GUIDE) w/Device KIT, CHECK GLUCOSE SIX TIMES DAILY Dx E11.9, Disp: 1 kit, Rfl: 0   busPIRone  (BUSPAR ) 7.5 MG tablet, TAKE 1 TABLET (7.5 MG TOTAL) BY MOUTH IN THE MORNING AND AT BEDTIME. FOR ANXIETY, Disp: 180 tablet, Rfl: 0   carbamide peroxide (DEBROX) 6.5 % OTIC solution, Place 5 drops into both ears 2 (two) times daily., Disp: 15 mL, Rfl: 0   cetirizine  (ZYRTEC ) 5 MG tablet, Take 1 tablet by mouth once daily, Disp: 30 tablet, Rfl: 0   Cholecalciferol (VITAMIN D -3 PO), Take 1,000 Units by mouth in the morning and at bedtime., Disp: , Rfl:    Continuous Glucose Receiver (FREESTYLE LIBRE 3 READER) DEVI, CHECK BLOOD GLUCOSE CONTINUOUSLY E11.9, Disp: 1 each, Rfl: 1   Continuous Glucose Sensor (FREESTYLE LIBRE 3 PLUS SENSOR) MISC, Check BGs continuously E11.9. Change sensor every 15 days., Disp: 6 each, Rfl: 3   empagliflozin  (JARDIANCE ) 25 MG TABS tablet, Take 1 tablet (25 mg total) by mouth daily before breakfast., Disp: 90 tablet, Rfl: 3   FLUoxetine  (PROZAC ) 10 MG capsule, TAKE 1 CAPSULE BY MOUTH EVERY DAY, Disp: 90 capsule, Rfl: 0   fluticasone  (FLONASE ) 50 MCG/ACT nasal spray, Place 2 sprays into both nostrils daily., Disp: 16 g, Rfl: 6   glucose blood (ACCU-CHEK GUIDE)  test strip, CHECK GLUCOSE SIX TIMES DAILY Dx E11.9, Disp: 600 each, Rfl: 3   hydrochlorothiazide  (MICROZIDE ) 12.5 MG capsule, TAKE 1 CAPSULE BY MOUTH EVERY DAY, Disp: 90 capsule, Rfl: 0   insulin  degludec (TRESIBA  FLEXTOUCH) 100 UNIT/ML FlexTouch Pen, INJECT 20-30 UNITS INTO THE SKIN DAILY., Disp: 18 mL, Rfl: 0   Insulin  Pen Needle (BD PEN NEEDLE NANO 2ND GEN) 32G X 4 MM MISC, Use daily with insulin  pen Dx E11.9, Disp: 100 each, Rfl: 3   LINZESS  290 MCG CAPS capsule, TAKE 1 CAPSULE BY MOUTH ONCE DAILY BEFORE BREAKFAST, Disp: 30 capsule, Rfl: 11   metFORMIN  (GLUCOPHAGE ) 500 MG tablet, Take 2 tablets (1,000 mg total) by mouth 2 (two) times daily with a meal., Disp: 360 tablet, Rfl: 3   metoprolol  tartrate  (LOPRESSOR ) 25 MG tablet, TAKE 1 TABLET BY MOUTH TWICE A DAY, Disp: 180 tablet, Rfl: 0   nystatin  (MYCOSTATIN /NYSTOP ) powder, Apply 1 Application topically 3 (three) times daily., Disp: 60 g, Rfl: 1   nystatin  cream (MYCOSTATIN ), Apply 1 Application topically 2 (two) times daily. X7-10 days, Disp: 30 g, Rfl: 0   omeprazole  (PRILOSEC) 40 MG capsule, TAKE 1 CAPSULE BY MOUTH TWICE A DAY, Disp: 180 capsule, Rfl: 0   ondansetron  (ZOFRAN ) 4 MG tablet, Take 1 tablet (4 mg total) by mouth every 8 (eight) hours as needed for nausea or vomiting., Disp: 20 tablet, Rfl: 0   pravastatin  (PRAVACHOL ) 40 MG tablet, TAKE 1 TABLET BY MOUTH EVERY DAY, Disp: 90 tablet, Rfl: 0   psyllium (METAMUCIL) 58.6 % powder, Take 1 packet by mouth daily., Disp: , Rfl:    Semaglutide ,0.25 or 0.5MG /DOS, (OZEMPIC , 0.25 OR 0.5 MG/DOSE,) 2 MG/3ML SOPN, Inject 0.25 mg into the skin once a week., Disp: , Rfl:    valACYclovir  (VALTREX ) 1000 MG tablet, Take 1 tablet by mouth twice daily, Disp: 20 tablet, Rfl: 0 Social History   Socioeconomic History   Marital status: Widowed    Spouse name: Not on file   Number of children: 2   Years of education: 77   Highest education level: 12th grade  Occupational History   Occupation: retired  Tobacco Use   Smoking status: Never   Smokeless tobacco: Never  Vaping Use   Vaping status: Never Used  Substance and Sexual Activity   Alcohol use: Yes    Comment: occasional wine   Drug use: No   Sexual activity: Not Currently    Birth control/protection: None  Other Topics Concern   Not on file  Social History Narrative   Lives alone. Daughters visit frequently   Social Drivers of Health   Financial Resource Strain: Low Risk  (10/27/2023)   Overall Financial Resource Strain (CARDIA)    Difficulty of Paying Living Expenses: Not hard at all  Food Insecurity: No Food Insecurity (10/27/2023)   Hunger Vital Sign    Worried About Running Out of Food in the Last Year: Never true    Ran Out of  Food in the Last Year: Never true  Transportation Needs: No Transportation Needs (10/27/2023)   PRAPARE - Administrator, Civil Service (Medical): No    Lack of Transportation (Non-Medical): No  Physical Activity: Insufficiently Active (10/27/2023)   Exercise Vital Sign    Days of Exercise per Week: 2 days    Minutes of Exercise per Session: 10 min  Stress: No Stress Concern Present (10/27/2023)   [employer] of Occupational Health - Occupational Stress Questionnaire    Feeling of Stress :  Only a little  Social Connections: Moderately Isolated (10/27/2023)   Social Connection and Isolation Panel [NHANES]    Frequency of Communication with Friends and Family: More than three times a week    Frequency of Social Gatherings with Friends and Family: More than three times a week    Attends Religious Services: More than 4 times per year    Active Member of [employer] or Organizations: No    Attends Banker Meetings: Never    Marital Status: Widowed  Intimate Partner Violence: Not At Risk (04/29/2023)   Humiliation, Afraid, Rape, and Kick questionnaire    Fear of Current or Ex-Partner: No    Emotionally Abused: No    Physically Abused: No    Sexually Abused: No   Family History  Problem Relation Age of Onset   Hypertension Mother    Hypertension Father    Diabetes Father    Hypertension Sister    Cancer Sister    Diabetes Brother    Diabetes Brother    Colon cancer Neg Hx     Objective: Office vital signs reviewed. BP 130/80   Pulse 64   Temp 98.4 F (36.9 C)   Ht 5\' 5"  (1.651 m)   Wt 138 lb (62.6 kg)   SpO2 98%   BMI 22.96 kg/m   Physical Examination:  General: Awake, alert, well appearing female, No acute distress HEENT: sclera white. MMM Cardio: regular rate and rhythm, S1S2 heard, no murmurs appreciated Pulm: clear to auscultation bilaterally, no wheezes, rhonchi or rales; normal work of breathing on room air MSK: Ambulating  independently Knee: She has some mild joint effusion noted bilaterally.  Minimal warmth noted along the left lower leg.  No erythema.  No tenderness to palpation.  She has full active range of motion with no palpable crepitus.  No tenderness to palpation of patella, patella tendon, quad tendon, joint line or posterior popliteal fossa   Assessment/ Plan: 78 y.o. female   Type 2 diabetes mellitus with insulin  therapy (HCC) - Plan: Bayer DCA Hb A1c Waived  Hyperlipidemia associated with type 2 diabetes mellitus (HCC)  Hypertension associated with type 2 diabetes mellitus (HCC)  Acute pain of left knee - Plan: naproxen (NAPROSYN) 500 MG tablet  Sugar remains not at goal.  She is only had a 0.1 reduction in her blood sugar despite addition of Jardiance  25 mg daily.  She is compliant with Tresiba  and metformin  but does not take the Ozempic  that was prescribed because she "did not want to look old because she lost too much weight".  However, I do think that it warrants usage as her sugar is not at goal.  She is either going to have to start planning mealtime insulin  or she is going to strongly need to consider going onto the Ozempic   Her blood pressure is controlled.  She should continue her meds as prescribed including statin  I am referring her to orthopedics for consideration of gel shots if they think this is clinically appropriate.  I did offer plain films of the knees today and referral to physical therapy.  I will place her on Naprosyn to see if this might help with some of the pain and inflammation but was very hesitant to administer any corticosteroid injection given grossly uncontrolled blood sugar as I fear that this may cause her to have sugars in the 5 and 600s which could land her in the hospital.  I discussed that with her at length today  Susan Petty  Bambi Bonine, DO [facility] [phone]

## 2023-11-03 NOTE — Patient Instructions (Addendum)
 NO ibuprofen.  I'm sending in a high dose of arthritis medication that you can take twice daily as needed. Take with food.  Your sugar is not controlled with your current medication. I prescribed Ozempic  to you to use once per week but last visit you told me you didn't want to use because you'd lose too much weight and it would make you look old.  Since you are spiking your sugars with meals, you probably just need to make sure you inject your meal time insulin  or start the ozempic .

## 2023-11-05 ENCOUNTER — Ambulatory Visit: Payer: Medicare Other | Admitting: Family Medicine

## 2023-11-16 ENCOUNTER — Other Ambulatory Visit: Payer: Self-pay | Admitting: Family Medicine

## 2023-11-16 DIAGNOSIS — E119 Type 2 diabetes mellitus without complications: Secondary | ICD-10-CM

## 2023-11-18 ENCOUNTER — Other Ambulatory Visit: Payer: Self-pay

## 2023-11-18 ENCOUNTER — Encounter: Payer: Self-pay | Admitting: Family Medicine

## 2023-11-18 DIAGNOSIS — E119 Type 2 diabetes mellitus without complications: Secondary | ICD-10-CM

## 2023-11-18 MED ORDER — FREESTYLE LIBRE 3 READER DEVI: 1 refills | Status: AC

## 2023-11-19 DIAGNOSIS — B078 Other viral warts: Secondary | ICD-10-CM | POA: Diagnosis not present

## 2023-11-19 DIAGNOSIS — L570 Actinic keratosis: Secondary | ICD-10-CM | POA: Diagnosis not present

## 2023-11-19 DIAGNOSIS — Z08 Encounter for follow-up examination after completed treatment for malignant neoplasm: Secondary | ICD-10-CM | POA: Diagnosis not present

## 2023-11-19 DIAGNOSIS — Z85828 Personal history of other malignant neoplasm of skin: Secondary | ICD-10-CM | POA: Diagnosis not present

## 2023-11-19 DIAGNOSIS — X32XXXD Exposure to sunlight, subsequent encounter: Secondary | ICD-10-CM | POA: Diagnosis not present

## 2023-11-20 ENCOUNTER — Other Ambulatory Visit: Payer: Self-pay | Admitting: Family Medicine

## 2023-11-20 DIAGNOSIS — E1159 Type 2 diabetes mellitus with other circulatory complications: Secondary | ICD-10-CM

## 2023-11-24 ENCOUNTER — Ambulatory Visit: Admitting: Nurse Practitioner

## 2023-12-01 ENCOUNTER — Other Ambulatory Visit: Payer: Self-pay | Admitting: Family Medicine

## 2023-12-01 DIAGNOSIS — F411 Generalized anxiety disorder: Secondary | ICD-10-CM

## 2023-12-11 ENCOUNTER — Ambulatory Visit: Payer: Self-pay

## 2023-12-11 NOTE — Telephone Encounter (Signed)
 FYI Only or Action Required?: FYI only for provider  Patient was last seen in primary care on 11/03/2023 by Eliodoro Guerin, DO. Called Nurse Triage reporting Dysuria. Symptoms began a week ago. Interventions attempted: OTC medications: urinary otc. Symptoms are: unchanged.  Triage Disposition: See Physician Within 24 Hours  Patient/caregiver understands and will follow disposition?: Yes  Copied from CRM [id number]. Topic: Clinical - Pink Word Triage >> Dec 11, 2023  3:53 PM Tiffany H wrote: Reason for CRM: Patient's daughter called to advise that patient suspects she's had a UTI for a couple days. Daughter advised that there is some burning. She's tried OTC conservative therapy, to no avail. Patient is scheduled to see Dr. Bonnell Butcher in July, but would like to come in for an acute care visit for a urine culture and treatment. Please call patient back directly as patient is more aware of symptom. Reason for Disposition  Urinating more frequently than usual (i.e., frequency)  Answer Assessment - Initial Assessment Questions 1. SYMPTOM: "What's the main symptom you're concerned about?" (e.g., frequency, incontinence)     dysuria 2. ONSET: "When did the  pain  start?"     One week 3. PAIN: "Is there any pain?" If Yes, ask: "How bad is it?" (Scale: 1-10; mild, moderate, severe)     Burning, itching 4. CAUSE: "What do you think is causing the symptoms?"     infection 5. OTHER SYMPTOMS: "Do you have any other symptoms?" (e.g., blood in urine, fever, flank pain, pain with urination)     Denies  9 day otc treatment not helping.  Protocols used: Urinary Symptoms-A-AH

## 2023-12-11 NOTE — Telephone Encounter (Signed)
 E2C2 scheduled appointment.

## 2023-12-12 ENCOUNTER — Telehealth: Payer: Self-pay

## 2023-12-12 ENCOUNTER — Encounter: Payer: Self-pay | Admitting: Family Medicine

## 2023-12-12 ENCOUNTER — Other Ambulatory Visit (HOSPITAL_COMMUNITY)
Admission: RE | Admit: 2023-12-12 | Discharge: 2023-12-12 | Disposition: A | Discharge status: Home / Self Care | Source: Ambulatory Visit | Attending: Family Medicine | Admitting: Family Medicine

## 2023-12-12 ENCOUNTER — Ambulatory Visit: Payer: Self-pay | Admitting: Family Medicine

## 2023-12-12 ENCOUNTER — Ambulatory Visit (INDEPENDENT_AMBULATORY_CARE_PROVIDER_SITE_OTHER): Admitting: Family Medicine

## 2023-12-12 VITALS — BP 126/58 | HR 76 | Temp 97.5°F | Ht 65.0 in | Wt 129.8 lb

## 2023-12-12 DIAGNOSIS — N898 Other specified noninflammatory disorders of vagina: Secondary | ICD-10-CM | POA: Diagnosis present

## 2023-12-12 DIAGNOSIS — B3731 Acute candidiasis of vulva and vagina: Secondary | ICD-10-CM | POA: Diagnosis not present

## 2023-12-12 DIAGNOSIS — R300 Dysuria: Secondary | ICD-10-CM

## 2023-12-12 LAB — MICROSCOPIC EXAMINATION
Epithelial Cells (non renal): NONE SEEN /HPF (ref 0–10)
Renal Epithel, UA: NONE SEEN /HPF
Yeast, UA: NONE SEEN

## 2023-12-12 LAB — URINALYSIS, COMPLETE
Bilirubin, UA: NEGATIVE
Leukocytes,UA: NEGATIVE
Nitrite, UA: NEGATIVE
Protein,UA: NEGATIVE
Specific Gravity, UA: <1.005 — ABNORMAL LOW (ref 1.005–1.030)
Urobilinogen, Ur: 0.2 mg/dL (ref 0.2–1.0)
pH, UA: 5.5 (ref 5.0–7.5)

## 2023-12-12 LAB — WET PREP FOR TRICH, YEAST, CLUE
Clue Cell Exam: NEGATIVE
Trichomonas Exam: NEGATIVE
Yeast Exam: POSITIVE — AB

## 2023-12-12 MED ORDER — FLUCONAZOLE 150 MG PO TABS
150.0000 mg | ORAL_TABLET | Freq: Once | ORAL | 0 refills | Status: AC
Start: 2023-12-12 — End: 2023-12-12

## 2023-12-12 NOTE — Progress Notes (Signed)
 Subjective:  Patient ID: Susan Petty, female    DOB: 30-Mar-1946, 78 y.o.   MRN: [id number]  Patient Care Team: Eliodoro Guerin, DO as PCP - General (Family Medicine) Alyce Jubilee, MD (Inactive) as Consulting Physician (Gastroenterology) Delilah Fend, [facility] (Pharmacist) Vinetta Greening, DO as Consulting Physician (Internal Medicine)   Chief Complaint:  Urinary Tract Infection   HPI: Susan Petty is a 78 y.o. female presenting on 12/12/2023 for Urinary Tract Infection   Susan Petty is an 78 year old female who presents with urinary symptoms and vaginal itching.  She has been experiencing burning and itching sensations, particularly during urination, for a couple of weeks. Her urine is yellow with a strong odor. This morning, she noted increased thirst and difficulty urinating, despite usually urinating frequently.  She has tried over-the-counter treatments, including Monistat, but these have not provided relief. No vaginal discharge, fever, or confusion, although she feels weak and tired, which she considers normal.      Relevant past medical, surgical, family, and social history reviewed and updated as indicated.  Allergies and medications reviewed and updated. Data reviewed: Chart in Epic.   Past Medical History:  Diagnosis Date   Diabetes mellitus    GERD (gastroesophageal reflux disease)    Hyperlipidemia    Hypertension    Osteoporosis     Past Surgical History:  Procedure Laterality Date   COLONOSCOPY N/A 03/29/2016   Dr. Nolene Baumgarten: Redundant left colon, nonbleeding internal hemorrhoids.  Consider colonoscopy in 10 to 15 years if benefits outweigh the risk    ESOPHAGOGASTRODUODENOSCOPY N/A 03/29/2016   Dr. Nolene Baumgarten: Small hiatal hernia, small sessile polyps found in the gastric fundus, benign fundic gland polyps, mild inflammation and erythema in the gastric antrum.  Duodenum was normal.  Biopsy negative for celiac.  Schatzki ring, patent.   TOTAL HIP  ARTHROPLASTY  09/03/2011   Procedure: TOTAL HIP ARTHROPLASTY;  Surgeon: Bevin Bucks, MD;  Location: [facility];  Service: Orthopedics;  Laterality: Left;    Social History   Socioeconomic History   Marital status: Widowed    Spouse name: Not on file   Number of children: 2   Years of education: 75   Highest education level: 12th grade  Occupational History   Occupation: retired  Tobacco Use   Smoking status: Never   Smokeless tobacco: Never  Vaping Use   Vaping status: Never Used  Substance and Sexual Activity   Alcohol use: Yes    Comment: occasional wine   Drug use: No   Sexual activity: Not Currently    Birth control/protection: None  Other Topics Concern   Not on file  Social History Narrative   Lives alone. Daughters visit frequently   Social Drivers of Health   Financial Resource Strain: Low Risk  (10/27/2023)   Overall Financial Resource Strain (CARDIA)    Difficulty of Paying Living Expenses: Not hard at all  Food Insecurity: No Food Insecurity (10/27/2023)   Hunger Vital Sign    Worried About Running Out of Food in the Last Year: Never true    Ran Out of Food in the Last Year: Never true  Transportation Needs: No Transportation Needs (10/27/2023)   PRAPARE - Administrator, Civil Service (Medical): No    Lack of Transportation (Non-Medical): No  Physical Activity: Insufficiently Active (10/27/2023)   Exercise Vital Sign    Days of Exercise per Week: 2 days    Minutes of Exercise  per Session: 10 min  Stress: No Stress Concern Present (10/27/2023)   [employer] of Occupational Health - Occupational Stress Questionnaire    Feeling of Stress : Only a little  Social Connections: Moderately Isolated (10/27/2023)   Social Connection and Isolation Panel [NHANES]    Frequency of Communication with Friends and Family: More than three times a week    Frequency of Social Gatherings with Friends and Family: More than three times a week    Attends Religious  Services: More than 4 times per year    Active Member of [employer] or Organizations: No    Attends Banker Meetings: Never    Marital Status: Widowed  Intimate Partner Violence: Not At Risk (04/29/2023)   Humiliation, Afraid, Rape, and Kick questionnaire    Fear of Current or Ex-Partner: No    Emotionally Abused: No    Physically Abused: No    Sexually Abused: No    Outpatient Encounter Medications as of 12/12/2023  Medication Sig   Accu-Chek Softclix Lancets lancets SMARTSIG:Topical 6 Times Daily   alendronate  (FOSAMAX ) 70 MG tablet TAKE 1 TABLET BY MOUTH ONCE A WEEK WITH  A  FULL  GLASS  OF  WATER   ON  AN  EMPTY  STOMACH   amLODipine  (NORVASC ) 10 MG tablet TAKE 1 TABLET BY MOUTH EVERY DAY   aspirin  EC 81 MG tablet Take 81 mg by mouth daily.   benazepril  (LOTENSIN ) 40 MG tablet TAKE 1 TABLET BY MOUTH EVERY DAY   Blood Glucose Monitoring Suppl (ACCU-CHEK GUIDE) w/Device KIT CHECK GLUCOSE SIX TIMES DAILY Dx E11.9   carbamide peroxide (DEBROX) 6.5 % OTIC solution Place 5 drops into both ears 2 (two) times daily.   cetirizine  (ZYRTEC ) 5 MG tablet Take 1 tablet by mouth once daily   Cholecalciferol (VITAMIN D -3 PO) Take 1,000 Units by mouth in the morning and at bedtime.   Continuous Glucose Receiver (FREESTYLE LIBRE 3 READER) DEVI CHECK BLOOD GLUCOSE CONTINUOUSLY E11.9   Continuous Glucose Sensor (FREESTYLE LIBRE 2 SENSOR) MISC USE AS DIRECTED TO CHECK SUGARS AT LEAST 4 TIMES DAILY. E11.9   empagliflozin  (JARDIANCE ) 25 MG TABS tablet Take 1 tablet (25 mg total) by mouth daily before breakfast.   fluconazole  (DIFLUCAN ) 150 MG tablet Take 1 tablet (150 mg total) by mouth once for 1 dose.   FLUoxetine  (PROZAC ) 10 MG capsule TAKE 1 CAPSULE BY MOUTH EVERY DAY   fluticasone  (FLONASE ) 50 MCG/ACT nasal spray Place 2 sprays into both nostrils daily.   glucose blood (ACCU-CHEK GUIDE) test strip CHECK GLUCOSE SIX TIMES DAILY Dx E11.9   hydrochlorothiazide  (MICROZIDE ) 12.5 MG capsule TAKE 1  CAPSULE BY MOUTH EVERY DAY   insulin  degludec (TRESIBA  FLEXTOUCH) 100 UNIT/ML FlexTouch Pen INJECT 20-30 UNITS INTO THE SKIN DAILY.   Insulin  Pen Needle (BD PEN NEEDLE NANO 2ND GEN) 32G X 4 MM MISC Use daily with insulin  pen Dx E11.9   LINZESS  290 MCG CAPS capsule TAKE 1 CAPSULE BY MOUTH ONCE DAILY BEFORE BREAKFAST   metFORMIN  (GLUCOPHAGE ) 500 MG tablet Take 2 tablets (1,000 mg total) by mouth 2 (two) times daily with a meal.   metoprolol  tartrate (LOPRESSOR ) 25 MG tablet TAKE 1 TABLET BY MOUTH TWICE A DAY   naproxen  (NAPROSYN ) 500 MG tablet Take 1 tablet (500 mg total) by mouth 2 (two) times daily with a meal. As needed for knee pain/ swelling   nystatin  (MYCOSTATIN /NYSTOP ) powder Apply 1 Application topically 3 (three) times daily.   nystatin  cream (MYCOSTATIN ) Apply 1  Application topically 2 (two) times daily. X7-10 days   omeprazole  (PRILOSEC) 40 MG capsule TAKE 1 CAPSULE BY MOUTH TWICE A DAY   ondansetron  (ZOFRAN ) 4 MG tablet Take 1 tablet (4 mg total) by mouth every 8 (eight) hours as needed for nausea or vomiting.   pravastatin  (PRAVACHOL ) 40 MG tablet TAKE 1 TABLET BY MOUTH EVERY DAY   psyllium (METAMUCIL) 58.6 % powder Take 1 packet by mouth daily.   Semaglutide ,0.25 or 0.5MG /DOS, (OZEMPIC , 0.25 OR 0.5 MG/DOSE,) 2 MG/3ML SOPN Inject 0.25 mg into the skin once a week.   valACYclovir  (VALTREX ) 1000 MG tablet Take 1 tablet by mouth twice daily   busPIRone  (BUSPAR ) 7.5 MG tablet TAKE 1 TABLET (7.5 MG TOTAL) BY MOUTH IN THE MORNING AND AT BEDTIME. FOR ANXIETY (Patient not taking: Reported on 12/12/2023)   No facility-administered encounter medications on file as of 12/12/2023.    Allergies  Allergen Reactions   Sulfa Antibiotics    Sulfa Drugs Cross Reactors Rash    Pertinent ROS per HPI, otherwise unremarkable      Objective:  BP (!) 126/58   Pulse 76   Temp (!) 97.5 F (36.4 C) (Temporal)   Ht 5\' 5"  (1.651 m)   Wt 129 lb 12.8 oz (58.9 kg)   BMI 21.60 kg/m    Wt Readings from  Last 3 Encounters:  12/12/23 129 lb 12.8 oz (58.9 kg)  11/03/23 138 lb (62.6 kg)  08/06/23 142 lb 12.8 oz (64.8 kg)    Physical Exam Vitals and nursing note reviewed.  Constitutional:      General: She is not in acute distress.    Appearance: Normal appearance. She is normal weight. She is not ill-appearing, toxic-appearing or diaphoretic.  HENT:     Head: Normocephalic and atraumatic.     Nose: Nose normal.     Mouth/Throat:     Mouth: Mucous membranes are moist.  Eyes:     Pupils: Pupils are equal, round, and reactive to light.  Cardiovascular:     Rate and Rhythm: Normal rate and regular rhythm.     Heart sounds: Normal heart sounds.  Pulmonary:     Effort: Pulmonary effort is normal.     Breath sounds: Normal breath sounds.  Abdominal:     General: Abdomen is flat.     Palpations: Abdomen is soft.     Tenderness: There is no abdominal tenderness. There is no right CVA tenderness or left CVA tenderness.  Genitourinary:    Comments: Self wet prep performed Musculoskeletal:     Cervical back: Neck supple.     Right lower leg: No edema.     Left lower leg: No edema.  Skin:    General: Skin is warm and dry.     Capillary Refill: Capillary refill takes less than 2 seconds.  Neurological:     General: No focal deficit present.     Mental Status: She is alert and oriented to person, place, and time.  Psychiatric:        Mood and Affect: Mood normal.        Behavior: Behavior normal.        Thought Content: Thought content normal.        Judgment: Judgment normal.        Results for orders placed or performed in visit on 11/03/23  Bayer DCA Hb A1c Waived   Collection Time: 11/03/23 10:33 AM  Result Value Ref Range   HB A1C (BAYER DCA - WAIVED)  8.2 (H) 4.8 - 5.6 %       Pertinent labs & imaging results that were available during my care of the patient were reviewed by me and considered in my medical decision making.  Assessment & Plan:  Felishia was seen today for  urinary tract infection.  Diagnoses and all orders for this visit:  Dysuria -     Urine Culture -     Urinalysis, Complete -     WET PREP FOR TRICH, YEAST, CLUE -     Urine cytology ancillary only  Vaginal irritation -     WET PREP FOR TRICH, YEAST, CLUE -     Urine cytology ancillary only  Yeast vaginitis -     fluconazole  (DIFLUCAN ) 150 MG tablet; Take 1 tablet (150 mg total) by mouth once for 1 dose. -     Urine cytology ancillary only      Vulvovaginal Candidiasis Presents with vaginal itching and irritation for a couple of weeks. Wet prep confirmed yeast presence, indicating vulvovaginal candidiasis. Greater than 25 WBC on wet prep, will add GC testing. No vaginal discharge reported, and not sexually active at this time. Condition likely uncomplicated given absence of systemic symptoms such as fever. - Prescribe fluconazole  (Diflucan ) as a single-dose treatment. - Instruct to pick up medication from the pharmacy.  Urinary Tract Infection (UTI) - Ruled Out Reported symptoms suggestive of a urinary tract infection, including dysuria, frequent urination, and malodorous urine. Urinalysis returned normal results, ruling out a UTI. Initially unable to provide a urine sample, but subsequent testing did not indicate infection. - If urine culture shows any infection, consider prescribing antibiotics.        Continue all other maintenance medications.  Follow up plan: Return if symptoms worsen or fail to improve.   The above assessment and management plan was discussed with the patient. The patient verbalized understanding of and has agreed to the management plan. Patient is aware to call the clinic if they develop any new symptoms or if symptoms persist or worsen. Patient is aware when to return to the clinic for a follow-up visit. Patient educated on when it is appropriate to go to the emergency department.   Kattie Parrot, FNP-C [facility] [phone]

## 2023-12-12 NOTE — Telephone Encounter (Signed)
 Patient called and I saw she had already read the MyChart message. Advised that the urine culture will determine if she has a UTI or not, but for now the fluconazole  should treat the yeast. She asked why do they only give 1 pill, advised most times 1 pill is all it takes to get rid of the yeast infection. She says this is more than a yeast infection based on the lab results I saw on MyChart. Advised per the provider, the results did not indicate the need for an antibiotic for UTI, so a urine culture is being done to determine if there is a UTI or not and if it comes back as positive for a UTI, the antibiotic will be sent in. Advised a culture can take a few days to come back. Advised if symptoms are not improving to either call us  on Monday morning or send a message in MyChart. She verbalized understanding.     Copied from CRM [id number]. Topic: Clinical - Medication Question >> Dec 12, 2023  2:18 PM Yolanda T wrote: Reason for CRM: patient called stated she would like the antibiotic provider told her she would send at her appt this morning. Patient said she needed it before the weekend due to the lab results she got in her mychart. Please advise

## 2023-12-12 NOTE — Telephone Encounter (Signed)
Noted  -LS

## 2023-12-15 ENCOUNTER — Other Ambulatory Visit: Payer: Self-pay | Admitting: Family Medicine

## 2023-12-15 ENCOUNTER — Ambulatory Visit: Payer: Self-pay

## 2023-12-15 DIAGNOSIS — N3090 Cystitis, unspecified without hematuria: Secondary | ICD-10-CM

## 2023-12-15 LAB — URINE CYTOLOGY ANCILLARY ONLY
Chlamydia: NEGATIVE
Comment: NEGATIVE
Comment: NEGATIVE
Comment: NORMAL
Neisseria Gonorrhea: NEGATIVE
Trichomonas: NEGATIVE

## 2023-12-15 MED ORDER — CEPHALEXIN 500 MG PO CAPS: 500.0000 mg | ORAL_CAPSULE | Freq: Two times a day (BID) | ORAL | 0 refills | Status: AC

## 2023-12-15 NOTE — Telephone Encounter (Signed)
 Pt has been notified. LS

## 2023-12-15 NOTE — Telephone Encounter (Signed)
 FYI Only or Action Required?: Action required by provider  Patient was last seen in primary care on 12/12/2023 by Galvin Jules, FNP. Called Nurse Triage reporting Urinary sx. Symptoms began several days ago. Interventions attempted: Rest, hydration, or home remedies. Symptoms are: unchanged.  Triage Disposition: See Physician Within 24 Hours  Patient/caregiver understands and will follow disposition?: No, wishes to speak with PCP  Copied from CRM [id number]. Topic: Clinical - Medical Advice >> Dec 15, 2023  8:35 AM Tisa Forester wrote: Reason for CRM: patient had an appointment with Kattie Parrot on 12/12/23 for urinary symptoms had a yeast infection and was given one pill for a week and provider Georgeann Kindred Rakes told patient to call back if still have burning will prescribe an antibotic  medication is not working and if still burning sensation was   patient callback  [phone] Reason for Disposition  Age > 50 years  Answer Assessment - Initial Assessment Questions 1. SEVERITY: "How bad is the pain?"  (e.g., Scale 1-10; mild, moderate, or severe)   - MILD (1-3): complains slightly about urination hurting   - MODERATE (4-7): interferes with normal activities     - SEVERE (8-10): excruciating, unwilling or unable to urinate because of the pain      Moderate 2. FREQUENCY: "How many times have you had painful urination today?"      unchanged 3. PATTERN: "Is pain present every time you urinate or just sometimes?"      unchanged 4. ONSET: "When did the painful urination start?"      unchanged 5. FEVER: "Do you have a fever?" If Yes, ask: "What is your temperature, how was it measured, and when did it start?"     Denies 6. PAST UTI: "Have you had a urine infection before?" If Yes, ask: "When was the last time?" and "What happened that time?"      yes 7. CAUSE: "What do you think is causing the painful urination?"  (e.g., UTI, scratch, Herpes sore)     UTI 8. OTHER SYMPTOMS: "Do you have any other  symptoms?" (e.g., blood in urine, flank pain, genital sores, urgency, vaginal discharge)     Denies  Additional info: 12/12/23 evaluated for uti sx, prescribed medication for yeast, urine sent for culture, advised to call back if symptoms not improving. Symptoms have not resolved and remain the same, dysuria burning. Denies new symptoms. Requests medication to [facility]  Protocols used: Urination Pain - Female-A-AH

## 2023-12-15 NOTE — Telephone Encounter (Signed)
 I reviewed her urine culture which has grown Klebsiella so Keflex  sent.  Please inform patient to hold her Jardiance  while being treated for urinary tract infection and drink plenty of water .  Please inform her that she has a antibiotic at the pharmacy to pick up

## 2023-12-16 LAB — URINE CULTURE

## 2023-12-20 ENCOUNTER — Other Ambulatory Visit: Payer: Self-pay | Admitting: Family Medicine

## 2023-12-20 DIAGNOSIS — E119 Type 2 diabetes mellitus without complications: Secondary | ICD-10-CM

## 2023-12-21 ENCOUNTER — Other Ambulatory Visit: Payer: Self-pay | Admitting: Family Medicine

## 2023-12-30 DIAGNOSIS — B078 Other viral warts: Secondary | ICD-10-CM | POA: Diagnosis not present

## 2023-12-30 DIAGNOSIS — X32XXXD Exposure to sunlight, subsequent encounter: Secondary | ICD-10-CM | POA: Diagnosis not present

## 2023-12-30 DIAGNOSIS — L570 Actinic keratosis: Secondary | ICD-10-CM | POA: Diagnosis not present

## 2024-01-24 ENCOUNTER — Other Ambulatory Visit: Payer: Self-pay | Admitting: Family Medicine

## 2024-01-24 DIAGNOSIS — E1169 Type 2 diabetes mellitus with other specified complication: Secondary | ICD-10-CM

## 2024-01-24 DIAGNOSIS — I152 Hypertension secondary to endocrine disorders: Secondary | ICD-10-CM

## 2024-02-04 ENCOUNTER — Ambulatory Visit (INDEPENDENT_AMBULATORY_CARE_PROVIDER_SITE_OTHER): Admitting: Family Medicine

## 2024-02-04 VITALS — BP 131/57 | HR 71 | Temp 98.6°F | Ht 65.0 in | Wt 123.0 lb

## 2024-02-04 DIAGNOSIS — E119 Type 2 diabetes mellitus without complications: Secondary | ICD-10-CM | POA: Diagnosis not present

## 2024-02-04 DIAGNOSIS — I152 Hypertension secondary to endocrine disorders: Secondary | ICD-10-CM | POA: Diagnosis not present

## 2024-02-04 DIAGNOSIS — E1159 Type 2 diabetes mellitus with other circulatory complications: Secondary | ICD-10-CM

## 2024-02-04 DIAGNOSIS — E1169 Type 2 diabetes mellitus with other specified complication: Secondary | ICD-10-CM

## 2024-02-04 DIAGNOSIS — M792 Neuralgia and neuritis, unspecified: Secondary | ICD-10-CM

## 2024-02-04 DIAGNOSIS — E785 Hyperlipidemia, unspecified: Secondary | ICD-10-CM | POA: Diagnosis not present

## 2024-02-04 DIAGNOSIS — Z794 Long term (current) use of insulin: Secondary | ICD-10-CM

## 2024-02-04 LAB — BAYER DCA HB A1C WAIVED: HB A1C (BAYER DCA - WAIVED): 7.8 % — ABNORMAL HIGH (ref 4.8–5.6)

## 2024-02-04 MED ORDER — ALPHA-LIPOIC ACID 600 MG PO CAPS: 1.0000 | ORAL_CAPSULE | Freq: Every day | ORAL | 3 refills | Status: AC

## 2024-02-04 NOTE — Progress Notes (Signed)
 Subjective: CC:DM PCP: Jolinda Susan HERO, DO YEP:Susan Petty is a 78 y.o. female presenting to clinic today for:  1. Type 2 Diabetes with hypertension, hyperlipidemia:  Utilizing freestyle libre CGM.  She presents today and notes that her blood sugars have been running as high as 200s but typically in the 160s.  She did have an isolated blood sugar to 55.  She keeps something sugary on her side just in case.  She is currently injecting 16 units of Tresiba , taking 0.25 mg of Ozempic  weekly, taking Jardiance  25 mg daily and metformin  1000 mg twice daily.  She is compliant with her blood pressure and cholesterol medications.  Reports no vaginal symptoms but did have a recent UTI that was cleared  Diabetes Health Maintenance Due  Topic Date Due   OPHTHALMOLOGY EXAM  08/19/2015   HEMOGLOBIN A1C  05/04/2024   FOOT EXAM  08/05/2024    Last A1c:  Lab Results  Component Value Date   HGBA1C 8.2 (H) 11/03/2023    ROS: Denies dizziness, LOC, polyuria, polydipsia, unintended weight loss/gain, foot ulcerations, numbness or tingling in extremities, shortness of breath or chest pain.  She does report an occasional zap sensation that goes up her leg it does not last long.   ROS: Per HPI  Allergies  Allergen Reactions   Sulfa Antibiotics    Sulfa Drugs Cross Reactors Rash   Past Medical History:  Diagnosis Date   Diabetes mellitus    GERD (gastroesophageal reflux disease)    Hyperlipidemia    Hypertension    Osteoporosis     Current Outpatient Medications:    Accu-Chek Softclix Lancets lancets, SMARTSIG:Topical 6 Times Daily, Disp: , Rfl:    alendronate  (FOSAMAX ) 70 MG tablet, TAKE 1 TABLET BY MOUTH ONCE A WEEK WITH  A  FULL  GLASS  OF  WATER   ON  AN  EMPTY  STOMACH, Disp: 12 tablet, Rfl: 3   amLODipine  (NORVASC ) 10 MG tablet, TAKE 1 TABLET BY MOUTH EVERY DAY, Disp: 90 tablet, Rfl: 0   aspirin  EC 81 MG tablet, Take 81 mg by mouth daily., Disp: , Rfl:    benazepril  (LOTENSIN ) 40  MG tablet, TAKE 1 TABLET BY MOUTH EVERY DAY, Disp: 90 tablet, Rfl: 0   Blood Glucose Monitoring Suppl (ACCU-CHEK GUIDE) w/Device KIT, CHECK GLUCOSE SIX TIMES DAILY Dx E11.9, Disp: 1 kit, Rfl: 0   busPIRone  (BUSPAR ) 7.5 MG tablet, TAKE 1 TABLET (7.5 MG TOTAL) BY MOUTH IN THE MORNING AND AT BEDTIME. FOR ANXIETY (Patient not taking: Reported on 12/12/2023), Disp: 180 tablet, Rfl: 0   carbamide peroxide (DEBROX) 6.5 % OTIC solution, Place 5 drops into both ears 2 (two) times daily., Disp: 15 mL, Rfl: 0   cetirizine  (ZYRTEC ) 5 MG tablet, Take 1 tablet by mouth once daily, Disp: 30 tablet, Rfl: 0   Cholecalciferol (VITAMIN D -3 PO), Take 1,000 Units by mouth in the morning and at bedtime., Disp: , Rfl:    Continuous Glucose Receiver (FREESTYLE LIBRE 3 READER) DEVI, CHECK BLOOD GLUCOSE CONTINUOUSLY E11.9, Disp: 1 each, Rfl: 1   Continuous Glucose Sensor (FREESTYLE LIBRE 2 SENSOR) MISC, USE AS DIRECTED TO CHECK SUGARS AT LEAST 4 TIMES DAILY. E11.9, Disp: 6 each, Rfl: 4   empagliflozin  (JARDIANCE ) 25 MG TABS tablet, Take 1 tablet (25 mg total) by mouth daily before breakfast., Disp: 90 tablet, Rfl: 3   FLUoxetine  (PROZAC ) 10 MG capsule, TAKE 1 CAPSULE BY MOUTH EVERY DAY, Disp: 90 capsule, Rfl: 0   fluticasone  (FLONASE )  50 MCG/ACT nasal spray, Place 2 sprays into both nostrils daily., Disp: 16 g, Rfl: 6   glucose blood (ACCU-CHEK GUIDE) test strip, CHECK GLUCOSE SIX TIMES DAILY Dx E11.9, Disp: 600 each, Rfl: 3   hydrochlorothiazide  (MICROZIDE ) 12.5 MG capsule, TAKE 1 CAPSULE BY MOUTH EVERY DAY, Disp: 90 capsule, Rfl: 0   insulin  degludec (TRESIBA  FLEXTOUCH) 100 UNIT/ML FlexTouch Pen, INJECT 20-30 UNITS INTO THE SKIN DAILY., Disp: 18 mL, Rfl: 0   Insulin  Pen Needle (BD PEN NEEDLE NANO 2ND GEN) 32G X 4 MM MISC, Use daily with insulin  pen Dx E11.9, Disp: 100 each, Rfl: 3   LINZESS  290 MCG CAPS capsule, TAKE 1 CAPSULE BY MOUTH ONCE DAILY BEFORE BREAKFAST, Disp: 30 capsule, Rfl: 11   metFORMIN  (GLUCOPHAGE ) 500 MG  tablet, Take 2 tablets (1,000 mg total) by mouth 2 (two) times daily with a meal., Disp: 360 tablet, Rfl: 3   metoprolol  tartrate (LOPRESSOR ) 25 MG tablet, TAKE 1 TABLET BY MOUTH TWICE A DAY, Disp: 180 tablet, Rfl: 0   naproxen  (NAPROSYN ) 500 MG tablet, Take 1 tablet (500 mg total) by mouth 2 (two) times daily with a meal. As needed for knee pain/ swelling, Disp: 180 tablet, Rfl: 1   nystatin  (MYCOSTATIN /NYSTOP ) powder, Apply 1 Application topically 3 (three) times daily., Disp: 60 g, Rfl: 1   nystatin  cream (MYCOSTATIN ), Apply 1 Application topically 2 (two) times daily. X7-10 days, Disp: 30 g, Rfl: 0   omeprazole  (PRILOSEC) 40 MG capsule, TAKE 1 CAPSULE BY MOUTH TWICE A DAY, Disp: 180 capsule, Rfl: 0   ondansetron  (ZOFRAN ) 4 MG tablet, Take 1 tablet (4 mg total) by mouth every 8 (eight) hours as needed for nausea or vomiting., Disp: 20 tablet, Rfl: 0   pravastatin  (PRAVACHOL ) 40 MG tablet, TAKE 1 TABLET BY MOUTH EVERY DAY, Disp: 90 tablet, Rfl: 0   psyllium (METAMUCIL) 58.6 % powder, Take 1 packet by mouth daily., Disp: , Rfl:    Semaglutide ,0.25 or 0.5MG /DOS, (OZEMPIC , 0.25 OR 0.5 MG/DOSE,) 2 MG/3ML SOPN, Inject 0.25 mg into the skin once a week., Disp: 3 mL, Rfl: 2   valACYclovir  (VALTREX ) 1000 MG tablet, Take 1 tablet by mouth twice daily, Disp: 20 tablet, Rfl: 0 Social History   Socioeconomic History   Marital status: Widowed    Spouse name: Not on file   Number of children: 2   Years of education: 80   Highest education level: 12th grade  Occupational History   Occupation: retired  Tobacco Use   Smoking status: Never   Smokeless tobacco: Never  Vaping Use   Vaping status: Never Used  Substance and Sexual Activity   Alcohol use: Yes    Comment: occasional wine   Drug use: No   Sexual activity: Not Currently    Birth control/protection: None  Other Topics Concern   Not on file  Social History Narrative   Lives alone. Daughters visit frequently   Social Drivers of Health    Financial Resource Strain: Low Risk  (02/04/2024)   Overall Financial Resource Strain (CARDIA)    Difficulty of Paying Living Expenses: Not hard at all  Food Insecurity: No Food Insecurity (02/04/2024)   Hunger Vital Sign    Worried About Running Out of Food in the Last Year: Never true    Ran Out of Food in the Last Year: Never true  Transportation Needs: No Transportation Needs (02/04/2024)   PRAPARE - Administrator, Civil Service (Medical): No    Lack of Transportation (Non-Medical):  No  Physical Activity: Insufficiently Active (02/04/2024)   Exercise Vital Sign    Days of Exercise per Week: 3 days    Minutes of Exercise per Session: 30 min  Stress: No Stress Concern Present (02/04/2024)   [employer] of Occupational Health - Occupational Stress Questionnaire    Feeling of Stress: Not at all  Social Connections: Moderately Isolated (02/04/2024)   Social Connection and Isolation Panel    Frequency of Communication with Friends and Family: More than three times a week    Frequency of Social Gatherings with Friends and Family: More than three times a week    Attends Religious Services: 1 to 4 times per year    Active Member of [employer] or Organizations: No    Attends Banker Meetings: Not on file    Marital Status: Widowed  Intimate Partner Violence: Not At Risk (04/29/2023)   Humiliation, Afraid, Rape, and Kick questionnaire    Fear of Current or Ex-Partner: No    Emotionally Abused: No    Physically Abused: No    Sexually Abused: No   Family History  Problem Relation Age of Onset   Hypertension Mother    Hypertension Father    Diabetes Father    Hypertension Sister    Cancer Sister    Diabetes Brother    Diabetes Brother    Colon cancer Neg Hx     Objective: Office vital signs reviewed. BP (!) 131/57   Pulse 71   Temp 98.6 F (37 C)   Ht 5' 5 (1.651 m)   Wt 123 lb (55.8 kg)   SpO2 94%   BMI 20.47 kg/m   Physical Examination:   General: Awake, alert, well appearing female, No acute distress HEENT: Sclera white.  Moist mucous membranes Cardio: regular rate and rhythm, S1S2 heard, no murmurs appreciated Pulm: clear to auscultation bilaterally, no wheezes, rhonchi or rales; normal work of breathing on room air  Assessment/ Plan: 78 y.o. female   Type 2 diabetes mellitus with insulin  therapy (HCC) - Plan: Bayer DCA Hb A1c Waived  Hyperlipidemia associated with type 2 diabetes mellitus (HCC)  Hypertension associated with type 2 diabetes mellitus (HCC)  Neuralgia - Plan: Alpha-Lipoic Acid 600 MG CAPS  Check A1c.  Continue all medications as prescribed.  I suspect her blood sugars will be under fairly good control based on her numbers today.  She will continue all medications as prescribed.  I am adding alpha lipoic acid to help with what sounds like some intermittent neuralgia of the legs  Susan CHRISTELLA Fielding, DO [facility] [phone]

## 2024-02-14 ENCOUNTER — Other Ambulatory Visit: Payer: Self-pay | Admitting: Family Medicine

## 2024-02-14 DIAGNOSIS — E119 Type 2 diabetes mellitus without complications: Secondary | ICD-10-CM

## 2024-02-20 ENCOUNTER — Other Ambulatory Visit: Payer: Self-pay | Admitting: Family Medicine

## 2024-02-20 DIAGNOSIS — E1159 Type 2 diabetes mellitus with other circulatory complications: Secondary | ICD-10-CM

## 2024-02-24 DIAGNOSIS — L570 Actinic keratosis: Secondary | ICD-10-CM | POA: Diagnosis not present

## 2024-02-24 DIAGNOSIS — D225 Melanocytic nevi of trunk: Secondary | ICD-10-CM | POA: Diagnosis not present

## 2024-02-24 DIAGNOSIS — X32XXXD Exposure to sunlight, subsequent encounter: Secondary | ICD-10-CM | POA: Diagnosis not present

## 2024-02-24 DIAGNOSIS — Z1283 Encounter for screening for malignant neoplasm of skin: Secondary | ICD-10-CM | POA: Diagnosis not present

## 2024-02-27 ENCOUNTER — Ambulatory Visit (INDEPENDENT_AMBULATORY_CARE_PROVIDER_SITE_OTHER): Admitting: Family Medicine

## 2024-02-27 ENCOUNTER — Encounter: Payer: Self-pay | Admitting: Family Medicine

## 2024-02-27 VITALS — BP 136/70 | HR 89 | Temp 97.0°F | Ht 65.0 in | Wt 121.0 lb

## 2024-02-27 DIAGNOSIS — N3000 Acute cystitis without hematuria: Secondary | ICD-10-CM

## 2024-02-27 DIAGNOSIS — N760 Acute vaginitis: Secondary | ICD-10-CM

## 2024-02-27 DIAGNOSIS — B9689 Other specified bacterial agents as the cause of diseases classified elsewhere: Secondary | ICD-10-CM

## 2024-02-27 LAB — WET PREP FOR TRICH, YEAST, CLUE
Clue Cell Exam: POSITIVE — AB
Trichomonas Exam: NEGATIVE
Yeast Exam: NEGATIVE

## 2024-02-27 LAB — MICROSCOPIC EXAMINATION
Renal Epithel, UA: NONE SEEN /HPF
WBC, UA: >30 /HPF — AB (ref 0–5)
Yeast, UA: NONE SEEN

## 2024-02-27 LAB — URINALYSIS, COMPLETE
Bilirubin, UA: NEGATIVE
Nitrite, UA: NEGATIVE
Protein,UA: NEGATIVE
Specific Gravity, UA: 1.01 (ref 1.005–1.030)
Urobilinogen, Ur: 0.2 mg/dL (ref 0.2–1.0)
pH, UA: 6 (ref 5.0–7.5)

## 2024-02-27 MED ORDER — CEPHALEXIN 500 MG PO CAPS: 500.0000 mg | ORAL_CAPSULE | Freq: Four times a day (QID) | ORAL | 0 refills | Status: DC

## 2024-02-27 MED ORDER — METRONIDAZOLE 1 % EX GEL: Freq: Every day | CUTANEOUS | 0 refills | Status: DC

## 2024-02-27 NOTE — Progress Notes (Signed)
 BP 136/70   Pulse 89   Temp (!) 97 F (36.1 C)   Ht 5' 5 (1.651 m)   Wt 121 lb (54.9 kg)   SpO2 96%   BMI 20.14 kg/m    Subjective:   Patient ID: Susan Petty, female    DOB: Aug 11, 1945, 78 y.o.   MRN: [id number]  HPI: Susan Petty is a 78 y.o. female presenting on 02/27/2024 for Urinary Tract Infection and Vaginal Itching   Discussed the use of AI scribe software for clinical note transcription with the patient, who gave verbal consent to proceed.  History of Present Illness   Susan Petty is an 78 year old female who presents with urinary and vaginal symptoms suggestive of a urinary tract infection.  She has been experiencing burning and redness in the vaginal area for approximately five weeks, describing it as 'real red down there' and 'irritated all get up'.  No fevers, chills, body aches, blood in urine, vaginal bleeding, or discharge. She has a history of frequent urinary tract infections.  She has attempted to manage the symptoms by increasing her water  intake to flush out the infection, but has not found relief with over-the-counter treatments. She used a vaginal product similar to Monistat the previous night, but it did not alleviate her symptoms.          Relevant past medical, surgical, family and social history reviewed and updated as indicated. Interim medical history since our last visit reviewed. Allergies and medications reviewed and updated.  Review of Systems  Constitutional:  Negative for chills and fever.  Eyes:  Negative for visual disturbance.  Respiratory:  Negative for chest tightness and shortness of breath.   Cardiovascular:  Negative for chest pain and leg swelling.  Gastrointestinal:  Negative for abdominal pain.  Genitourinary:  Positive for dysuria, frequency, urgency and vaginal pain. Negative for difficulty urinating, hematuria, vaginal bleeding and vaginal discharge.  Musculoskeletal:  Negative for back pain and gait problem.   Skin:  Negative for rash.  Neurological:  Negative for light-headedness and headaches.  Psychiatric/Behavioral:  Negative for agitation and behavioral problems.   All other systems reviewed and are negative.   Per HPI unless specifically indicated above   Allergies as of 02/27/2024       Reactions   Sulfa Antibiotics    Sulfa Drugs Cross Reactors Rash        Medication List        Accurate as of February 27, 2024  9:24 AM. If you have any questions, ask your nurse or doctor.          Accu-Chek Guide test strip Generic drug: glucose blood CHECK GLUCOSE SIX TIMES DAILY Dx E11.9   Accu-Chek Guide w/Device Kit CHECK GLUCOSE SIX TIMES DAILY Dx E11.9   Accu-Chek Softclix Lancets lancets SMARTSIG:Topical 6 Times Daily   alendronate  70 MG tablet Commonly known as: FOSAMAX  TAKE 1 TABLET BY MOUTH ONCE A WEEK WITH  A  FULL  GLASS  OF  WATER   ON  AN  EMPTY  STOMACH   Alpha-Lipoic Acid 600 MG Caps Take 1 capsule (600 mg total) by mouth daily. For nerves in leg   amLODipine  10 MG tablet Commonly known as: NORVASC  TAKE 1 TABLET BY MOUTH EVERY DAY   aspirin  EC 81 MG tablet Take 81 mg by mouth daily.   BD Pen Needle Nano 2nd Gen 32G X 4 MM Misc Generic drug: Insulin  Pen Needle Use daily with insulin  pen Dx  E11.9   benazepril  40 MG tablet Commonly known as: LOTENSIN  TAKE 1 TABLET BY MOUTH EVERY DAY   busPIRone  7.5 MG tablet Commonly known as: BUSPAR  TAKE 1 TABLET (7.5 MG TOTAL) BY MOUTH IN THE MORNING AND AT BEDTIME. FOR ANXIETY   cephALEXin  500 MG capsule Commonly known as: KEFLEX  Take 1 capsule (500 mg total) by mouth 4 (four) times daily. Started by: [facility] LABOR Aimie Wagman   cetirizine  5 MG tablet Commonly known as: ZYRTEC  Take 1 tablet by mouth once daily   Debrox 6.5 % OTIC solution Generic drug: carbamide peroxide Place 5 drops into both ears 2 (two) times daily.   empagliflozin  25 MG Tabs tablet Commonly known as: Jardiance  Take 1 tablet (25 mg total) by  mouth daily before breakfast.   FLUoxetine  10 MG capsule Commonly known as: PROZAC  TAKE 1 CAPSULE BY MOUTH EVERY DAY   fluticasone  50 MCG/ACT nasal spray Commonly known as: FLONASE  Place 2 sprays into both nostrils daily.   FreeStyle Libre 2 Sensor Misc USE AS DIRECTED TO CHECK SUGARS AT LEAST 4 TIMES DAILY. E11.9   FreeStyle Libre 3 Reader Devi CHECK BLOOD GLUCOSE CONTINUOUSLY E11.9   hydrochlorothiazide  12.5 MG capsule Commonly known as: MICROZIDE  TAKE 1 CAPSULE BY MOUTH EVERY DAY   Linzess  290 MCG Caps capsule Generic drug: linaclotide  TAKE 1 CAPSULE BY MOUTH ONCE DAILY BEFORE BREAKFAST   metFORMIN  500 MG tablet Commonly known as: GLUCOPHAGE  Take 2 tablets (1,000 mg total) by mouth 2 (two) times daily with a meal.   metoprolol  tartrate 25 MG tablet Commonly known as: LOPRESSOR  TAKE 1 TABLET BY MOUTH TWICE A DAY   metroNIDAZOLE  1 % gel Commonly known as: Metrogel  Apply topically daily. Started by: Fonda LABOR Coty Larsh   naproxen  500 MG tablet Commonly known as: Naprosyn  Take 1 tablet (500 mg total) by mouth 2 (two) times daily with a meal. As needed for knee pain/ swelling   nystatin  powder Commonly known as: MYCOSTATIN /NYSTOP  Apply 1 Application topically 3 (three) times daily.   nystatin  cream Commonly known as: MYCOSTATIN  Apply 1 Application topically 2 (two) times daily. X7-10 days   omeprazole  40 MG capsule Commonly known as: PRILOSEC TAKE 1 CAPSULE BY MOUTH TWICE A DAY   ondansetron  4 MG tablet Commonly known as: Zofran  Take 1 tablet (4 mg total) by mouth every 8 (eight) hours as needed for nausea or vomiting.   Ozempic  (0.25 or 0.5 MG/DOSE) 2 MG/3ML Sopn Generic drug: Semaglutide (0.25 or 0.5MG /DOS) Inject 0.25 mg into the skin once a week.   pravastatin  40 MG tablet Commonly known as: PRAVACHOL  TAKE 1 TABLET BY MOUTH EVERY DAY   psyllium 58.6 % powder Commonly known as: METAMUCIL Take 1 packet by mouth daily.   Tresiba  FlexTouch 100 UNIT/ML  FlexTouch Pen Generic drug: insulin  degludec INJECT 20-30 UNITS INTO THE SKIN DAILY.   valACYclovir  1000 MG tablet Commonly known as: VALTREX  Take 1 tablet by mouth twice daily   VITAMIN D -3 PO Take 1,000 Units by mouth in the morning and at bedtime.         Objective:   BP 136/70   Pulse 89   Temp (!) 97 F (36.1 C)   Ht 5' 5 (1.651 m)   Wt 121 lb (54.9 kg)   SpO2 96%   BMI 20.14 kg/m   Wt Readings from Last 3 Encounters:  02/27/24 121 lb (54.9 kg)  02/04/24 123 lb (55.8 kg)  12/12/23 129 lb 12.8 oz (58.9 kg)    Physical Exam Vitals and  nursing note reviewed. Exam conducted with a chaperone present.  Constitutional:      Appearance: Normal appearance.  Abdominal:     General: Abdomen is flat. Bowel sounds are normal. There is no distension.     Palpations: Abdomen is soft.     Tenderness: There is no abdominal tenderness. There is no right CVA tenderness, left CVA tenderness, guarding or rebound.     Hernia: No hernia is present.  Genitourinary:    Exam position: Lithotomy position.     Labia:        Right: Rash (Irritated red rash with no discharge but pink and slightly inflamed) present. No tenderness or lesion.        Left: Rash present. No tenderness or lesion.      Vagina: No vaginal discharge, erythema or tenderness.  Neurological:     Mental Status: She is alert.              Urinalysis shows greater than 30 WBCs and moderate bacteria and 3+ glucose and trace blood and trace ketones and 2+ leukocytes Wet prep shows positive for clue cells and greater than 25 WBCs few bacteria and few epithelial cells  Assessment & Plan:   Problem List Items Addressed This Visit   None Visit Diagnoses       Acute cystitis without hematuria    -  Primary   Relevant Medications   cephALEXin  (KEFLEX ) 500 MG capsule   Other Relevant Orders   Urinalysis, Complete   Urine Culture     BV (bacterial vaginosis)       Relevant Medications   metroNIDAZOLE   (METROGEL ) 1 % gel   cephALEXin  (KEFLEX ) 500 MG capsule   Other Relevant Orders   WET PREP FOR TRICH, YEAST, CLUE          Vaginal irritation and erythema Vaginal irritation and erythema persisted for five weeks without discharge or bleeding. Over-the-counter treatments ineffective. - Perform vaginal swab to assess for infection or other causes. - Review swab results to determine treatment.  Suspected urinary tract infection Suspected UTI with burning sensation. No fever, chills, body aches, or hematuria. - Perform urinalysis to confirm UTI.          Follow up plan: Return if symptoms worsen or fail to improve.  Counseling provided for all of the vaccine components Orders Placed This Encounter  Procedures   Urine Culture   WET PREP FOR TRICH, YEAST, CLUE   Urinalysis, Complete    Fonda Levins, MD [facility] 02/27/2024, 9:24 AM

## 2024-02-29 ENCOUNTER — Encounter: Payer: Self-pay | Admitting: Family Medicine

## 2024-02-29 LAB — URINE CULTURE

## 2024-03-01 ENCOUNTER — Ambulatory Visit: Payer: Self-pay | Admitting: Family Medicine

## 2024-03-08 ENCOUNTER — Other Ambulatory Visit: Payer: Self-pay | Admitting: Family Medicine

## 2024-03-08 DIAGNOSIS — F411 Generalized anxiety disorder: Secondary | ICD-10-CM

## 2024-03-24 ENCOUNTER — Other Ambulatory Visit: Payer: Self-pay | Admitting: Family Medicine

## 2024-04-05 ENCOUNTER — Ambulatory Visit (INDEPENDENT_AMBULATORY_CARE_PROVIDER_SITE_OTHER): Admitting: Family Medicine

## 2024-04-05 ENCOUNTER — Encounter: Payer: Self-pay | Admitting: Family Medicine

## 2024-04-05 VITALS — BP 114/56 | HR 78 | Temp 98.0°F | Ht 65.0 in | Wt 114.2 lb

## 2024-04-05 DIAGNOSIS — Z23 Encounter for immunization: Secondary | ICD-10-CM | POA: Diagnosis not present

## 2024-04-05 DIAGNOSIS — B3731 Acute candidiasis of vulva and vagina: Secondary | ICD-10-CM | POA: Diagnosis not present

## 2024-04-05 LAB — WET PREP FOR TRICH, YEAST, CLUE
Clue Cell Exam: NEGATIVE
Trichomonas Exam: NEGATIVE
Yeast Exam: POSITIVE — AB

## 2024-04-05 MED ORDER — FLUCONAZOLE 150 MG PO TABS: 150.0000 mg | ORAL_TABLET | Freq: Once | ORAL | 0 refills | Status: AC

## 2024-04-05 NOTE — Progress Notes (Signed)
 Subjective: RR:bzjdu infection? PCP: Jolinda Norene HERO, DO YEP:Susan Petty is a 78 y.o. female presenting to clinic today for:  Vaginitis Reports ongoing and worsening vaginal irritation.  She has been trying not to scratch at the vagina.  Denies overt burning but reports redness and inflammation.  She was recently treated with oral antibiotics and topical metronidazole  and upon further inspection the metronidazole  is not the vaginal formulation but rather the facial formulation and she's been using this vaginally but not internally.  No vaginal bleeding but maybe some pink when she swabbed just now   ROS: Per HPI  Allergies  Allergen Reactions   Sulfa Antibiotics    Sulfa Drugs Cross Reactors Rash   Past Medical History:  Diagnosis Date   Diabetes mellitus    GERD (gastroesophageal reflux disease)    Hyperlipidemia    Hypertension    Osteoporosis     Current Outpatient Medications:    Accu-Chek Softclix Lancets lancets, SMARTSIG:Topical 6 Times Daily, Disp: , Rfl:    alendronate  (FOSAMAX ) 70 MG tablet, TAKE 1 TABLET BY MOUTH ONCE A WEEK WITH  A  FULL  GLASS  OF  WATER   ON  AN  EMPTY  STOMACH, Disp: 12 tablet, Rfl: 3   Alpha-Lipoic Acid 600 MG CAPS, Take 1 capsule (600 mg total) by mouth daily. For nerves in leg, Disp: 100 capsule, Rfl: 3   amLODipine  (NORVASC ) 10 MG tablet, TAKE 1 TABLET BY MOUTH EVERY DAY, Disp: 90 tablet, Rfl: 0   aspirin  EC 81 MG tablet, Take 81 mg by mouth daily., Disp: , Rfl:    benazepril  (LOTENSIN ) 40 MG tablet, TAKE 1 TABLET BY MOUTH EVERY DAY, Disp: 90 tablet, Rfl: 0   Blood Glucose Monitoring Suppl (ACCU-CHEK GUIDE) w/Device KIT, CHECK GLUCOSE SIX TIMES DAILY Dx E11.9, Disp: 1 kit, Rfl: 0   busPIRone  (BUSPAR ) 7.5 MG tablet, TAKE 1 TABLET (7.5 MG TOTAL) BY MOUTH IN THE MORNING AND AT BEDTIME. FOR ANXIETY, Disp: 180 tablet, Rfl: 0   carbamide peroxide (DEBROX) 6.5 % OTIC solution, Place 5 drops into both ears 2 (two) times daily., Disp: 15 mL,  Rfl: 0   cephALEXin  (KEFLEX ) 500 MG capsule, Take 1 capsule (500 mg total) by mouth 4 (four) times daily., Disp: 28 capsule, Rfl: 0   cetirizine  (ZYRTEC ) 5 MG tablet, Take 1 tablet by mouth once daily, Disp: 30 tablet, Rfl: 0   Cholecalciferol (VITAMIN D -3 PO), Take 1,000 Units by mouth in the morning and at bedtime., Disp: , Rfl:    Continuous Glucose Receiver (FREESTYLE LIBRE 3 READER) DEVI, CHECK BLOOD GLUCOSE CONTINUOUSLY E11.9, Disp: 1 each, Rfl: 1   Continuous Glucose Sensor (FREESTYLE LIBRE 2 SENSOR) MISC, USE AS DIRECTED TO CHECK SUGARS AT LEAST 4 TIMES DAILY. E11.9, Disp: 6 each, Rfl: 4   empagliflozin  (JARDIANCE ) 25 MG TABS tablet, Take 1 tablet (25 mg total) by mouth daily before breakfast., Disp: 90 tablet, Rfl: 3   FLUoxetine  (PROZAC ) 10 MG capsule, TAKE 1 CAPSULE BY MOUTH EVERY DAY, Disp: 90 capsule, Rfl: 0   fluticasone  (FLONASE ) 50 MCG/ACT nasal spray, Place 2 sprays into both nostrils daily., Disp: 16 g, Rfl: 6   glucose blood (ACCU-CHEK GUIDE) test strip, CHECK GLUCOSE SIX TIMES DAILY Dx E11.9, Disp: 600 each, Rfl: 3   hydrochlorothiazide  (MICROZIDE ) 12.5 MG capsule, TAKE 1 CAPSULE BY MOUTH EVERY DAY, Disp: 90 capsule, Rfl: 0   insulin  degludec (TRESIBA  FLEXTOUCH) 100 UNIT/ML FlexTouch Pen, INJECT 20-30 UNITS INTO THE SKIN DAILY., Disp: 15  mL, Rfl: 0   Insulin  Pen Needle (BD PEN NEEDLE NANO 2ND GEN) 32G X 4 MM MISC, Use daily with insulin  pen Dx E11.9, Disp: 100 each, Rfl: 3   LINZESS  290 MCG CAPS capsule, TAKE 1 CAPSULE BY MOUTH ONCE DAILY BEFORE BREAKFAST, Disp: 30 capsule, Rfl: 11   metFORMIN  (GLUCOPHAGE ) 500 MG tablet, Take 2 tablets (1,000 mg total) by mouth 2 (two) times daily with a meal., Disp: 360 tablet, Rfl: 3   metoprolol  tartrate (LOPRESSOR ) 25 MG tablet, TAKE 1 TABLET BY MOUTH TWICE A DAY, Disp: 180 tablet, Rfl: 0   metroNIDAZOLE  (METROGEL ) 1 % gel, Apply topically daily., Disp: 45 g, Rfl: 0   naproxen  (NAPROSYN ) 500 MG tablet, Take 1 tablet (500 mg total) by mouth 2  (two) times daily with a meal. As needed for knee pain/ swelling, Disp: 180 tablet, Rfl: 1   nystatin  (MYCOSTATIN /NYSTOP ) powder, Apply 1 Application topically 3 (three) times daily., Disp: 60 g, Rfl: 1   nystatin  cream (MYCOSTATIN ), Apply 1 Application topically 2 (two) times daily. X7-10 days, Disp: 30 g, Rfl: 0   omeprazole  (PRILOSEC) 40 MG capsule, TAKE 1 CAPSULE BY MOUTH TWICE A DAY, Disp: 180 capsule, Rfl: 0   ondansetron  (ZOFRAN ) 4 MG tablet, Take 1 tablet (4 mg total) by mouth every 8 (eight) hours as needed for nausea or vomiting., Disp: 20 tablet, Rfl: 0   pravastatin  (PRAVACHOL ) 40 MG tablet, TAKE 1 TABLET BY MOUTH EVERY DAY, Disp: 90 tablet, Rfl: 0   psyllium (METAMUCIL) 58.6 % powder, Take 1 packet by mouth daily., Disp: , Rfl:    Semaglutide ,0.25 or 0.5MG /DOS, (OZEMPIC , 0.25 OR 0.5 MG/DOSE,) 2 MG/3ML SOPN, Inject 0.25 mg into the skin once a week., Disp: 3 mL, Rfl: 2   valACYclovir  (VALTREX ) 1000 MG tablet, Take 1 tablet by mouth twice daily, Disp: 20 tablet, Rfl: 0 Social History   Socioeconomic History   Marital status: Widowed    Spouse name: Not on file   Number of children: 2   Years of education: 53   Highest education level: 12th grade  Occupational History   Occupation: retired  Tobacco Use   Smoking status: Never   Smokeless tobacco: Never  Vaping Use   Vaping status: Never Used  Substance and Sexual Activity   Alcohol use: Yes    Comment: occasional wine   Drug use: No   Sexual activity: Not Currently    Birth control/protection: None  Other Topics Concern   Not on file  Social History Narrative   Lives alone. Daughters visit frequently   Social Drivers of Health   Financial Resource Strain: Low Risk  (02/04/2024)   Overall Financial Resource Strain (CARDIA)    Difficulty of Paying Living Expenses: Not hard at all  Food Insecurity: No Food Insecurity (02/04/2024)   Hunger Vital Sign    Worried About Running Out of Food in the Last Year: Never true     Ran Out of Food in the Last Year: Never true  Transportation Needs: No Transportation Needs (02/04/2024)   PRAPARE - Administrator, Civil Service (Medical): No    Lack of Transportation (Non-Medical): No  Physical Activity: Insufficiently Active (02/04/2024)   Exercise Vital Sign    Days of Exercise per Week: 3 days    Minutes of Exercise per Session: 30 min  Stress: No Stress Concern Present (02/04/2024)   [employer] of Occupational Health - Occupational Stress Questionnaire    Feeling of Stress: Not at all  Social Connections: Moderately Isolated (02/04/2024)   Social Connection and Isolation Panel    Frequency of Communication with Friends and Family: More than three times a week    Frequency of Social Gatherings with Friends and Family: More than three times a week    Attends Religious Services: 1 to 4 times per year    Active Member of [employer] or Organizations: No    Attends Banker Meetings: Not on file    Marital Status: Widowed  Intimate Partner Violence: Not At Risk (04/29/2023)   Humiliation, Afraid, Rape, and Kick questionnaire    Fear of Current or Ex-Partner: No    Emotionally Abused: No    Physically Abused: No    Sexually Abused: No   Family History  Problem Relation Age of Onset   Hypertension Mother    Hypertension Father    Diabetes Father    Hypertension Sister    Cancer Sister    Diabetes Brother    Diabetes Brother    Colon cancer Neg Hx     Objective: Office vital signs reviewed. BP (!) 114/56   Pulse 78   Temp 98 F (36.7 C)   Ht 5' 5 (1.651 m)   Wt 114 lb 4 oz (51.8 kg)   SpO2 98%   BMI 19.01 kg/m   Physical Examination:  General: Awake, alert, nontoxic, No acute distress GU: scant white discharge, vulvar erythema and a small excoriated area along the left lower labia majora. hemostatic  Assessment/ Plan: 78 y.o. female   Yeast vaginitis - Plan: WET PREP FOR TRICH, YEAST, CLUE, fluconazole  (DIFLUCAN ) 150 MG  tablet   Candidal vaginitis present.  Discussed to discontinue metronidazole  as this is not the vaginal formulation.  Diflucan  sent.  Home care instructions reviewed including cold compresses to the affected area.  She will follow-up as needed   Norene CHRISTELLA Fielding, DO [facility] [phone]

## 2024-04-05 NOTE — Patient Instructions (Signed)
 Use cool compresses to ease irritation. 2 tablets of diflucan  sent for yeast. Take 1 tablet today and then repeat in 3 days.

## 2024-04-07 ENCOUNTER — Other Ambulatory Visit: Payer: Self-pay | Admitting: Family Medicine

## 2024-04-07 DIAGNOSIS — E119 Type 2 diabetes mellitus without complications: Secondary | ICD-10-CM

## 2024-04-08 ENCOUNTER — Encounter: Payer: Self-pay | Admitting: Family Medicine

## 2024-04-08 DIAGNOSIS — K6289 Other specified diseases of anus and rectum: Secondary | ICD-10-CM

## 2024-04-08 NOTE — Telephone Encounter (Signed)
 Called and spoke to patient, she states that her symptoms have improved but she is still having burning in one area. Patient stated that there is a scratch close to her rectum that is causing the burning. Patient states that she has been doing the cold compresses and discontinued the metronidazole  like Dr. Jolinda told her to. Patient is asking for a topical ointment to be sent in.

## 2024-04-09 MED ORDER — HYDROCORTISONE (PERIANAL) 2.5 % EX CREA: 1.0000 | TOPICAL_CREAM | Freq: Two times a day (BID) | CUTANEOUS | 0 refills | Status: DC | PRN

## 2024-04-11 ENCOUNTER — Other Ambulatory Visit: Payer: Self-pay | Admitting: Family Medicine

## 2024-04-11 DIAGNOSIS — E119 Type 2 diabetes mellitus without complications: Secondary | ICD-10-CM

## 2024-04-22 ENCOUNTER — Other Ambulatory Visit: Payer: Self-pay | Admitting: Family Medicine

## 2024-04-22 DIAGNOSIS — E1169 Type 2 diabetes mellitus with other specified complication: Secondary | ICD-10-CM

## 2024-04-22 DIAGNOSIS — E1159 Type 2 diabetes mellitus with other circulatory complications: Secondary | ICD-10-CM

## 2024-04-29 ENCOUNTER — Ambulatory Visit: Payer: Medicare Other

## 2024-04-29 ENCOUNTER — Encounter: Payer: Self-pay | Admitting: Family Medicine

## 2024-04-29 VITALS — BP 114/56 | HR 78 | Ht 65.0 in | Wt 114.0 lb

## 2024-04-29 DIAGNOSIS — Z0000 Encounter for general adult medical examination without abnormal findings: Secondary | ICD-10-CM

## 2024-04-29 NOTE — Patient Instructions (Signed)
 Susan Petty,  Thank you for taking the time for your Medicare Wellness Visit. I appreciate your continued commitment to your health goals. Please review the care plan we discussed, and feel free to reach out if I can assist you further.  Medicare recommends these wellness visits once per year to help you and your care team stay ahead of potential health issues. These visits are designed to focus on prevention, allowing your provider to concentrate on managing your acute and chronic conditions during your regular appointments.  Please note that Annual Wellness Visits do not include a physical exam. Some assessments may be limited, especially if the visit was conducted virtually. If needed, we may recommend a separate in-person follow-up with your provider.  Ongoing Care Seeing your primary care provider every 3 to 6 months helps us  monitor your health and provide consistent, personalized care.   Referrals If a referral was made during today's visit and you haven't received any updates within two weeks, please contact the referred provider directly to check on the status.  Recommended Screenings:  Health Maintenance  Topic Date Due   Eye exam for diabetics  08/19/2015   DEXA scan (bone density measurement)  05/18/2022   COVID-19 Vaccine (4 - 2025-26 season) 03/08/2024   Medicare Annual Wellness Visit  04/28/2024   Zoster (Shingles) Vaccine (1 of 2) 05/06/2024*   DTaP/Tdap/Td vaccine (3 - Td or Tdap) 08/05/2024*   Yearly kidney function blood test for diabetes  08/05/2024   Yearly kidney health urinalysis for diabetes  08/05/2024   Complete foot exam   08/05/2024   Hemoglobin A1C  08/06/2024   Colon Cancer Screening  03/29/2026   Pneumococcal Vaccine for age over 44  Completed   Flu Shot  Completed   Meningitis B Vaccine  Aged Out   Breast Cancer Screening  Discontinued   Hepatitis C Screening  Discontinued  *Topic was postponed. The date shown is not the original due date.        04/29/2024   10:58 AM  Advanced Directives  Does Patient Have a Medical Advance Directive? No   Advance Care Planning is important because it: Ensures you receive medical care that aligns with your values, goals, and preferences. Provides guidance to your family and loved ones, reducing the emotional burden of decision-making during critical moments.  Vision: Annual vision screenings are recommended for early detection of glaucoma, cataracts, and diabetic retinopathy. These exams can also reveal signs of chronic conditions such as diabetes and high blood pressure.  Dental: Annual dental screenings help detect early signs of oral cancer, gum disease, and other conditions linked to overall health, including heart disease and diabetes.  Please see the attached documents for additional preventive care recommendations.

## 2024-04-29 NOTE — Progress Notes (Signed)
 Subjective:   Susan Petty is a 78 y.o. who presents for a Medicare Wellness preventive visit.  As a reminder, Annual Wellness Visits don't include a physical exam, and some assessments may be limited, especially if this visit is performed virtually. We may recommend an in-person follow-up visit with your provider if needed.  Visit Complete: Virtual I connected with  Susan Petty on 04/29/24 by a audio enabled telemedicine application and verified that I am speaking with the correct person using two identifiers.  Patient Location: Home  Provider Location: Home Office  I discussed the limitations of evaluation and management by telemedicine. The patient expressed understanding and agreed to proceed.  Vital Signs: Because this visit was a virtual/telehealth visit, some criteria may be missing or patient reported. Any vitals not documented were not able to be obtained and vitals that have been documented are patient reported.  VideoDeclined- This patient declined Librarian, academic. Therefore the visit was completed with audio only.  Persons Participating in Visit: Patient.  AWV Questionnaire: No: Patient Medicare AWV questionnaire was not completed prior to this visit.  Cardiac Risk Factors include: advanced age (>87men, >70 women);dyslipidemia;hypertension;diabetes mellitus     Objective:    Today's Vitals   04/29/24 1039  BP: (!) 114/56  Pulse: 78  Weight: 114 lb (51.7 kg)  Height: 5' 5 (1.651 m)   Body mass index is 18.97 kg/m.     04/29/2024   10:58 AM 04/29/2023   10:40 AM 04/26/2022   10:23 AM 04/25/2021   10:09 AM 04/19/2020    9:43 AM 04/19/2019    9:52 AM 04/14/2018   10:37 AM  Advanced Directives  Does Patient Have a Medical Advance Directive? No No No No No No No   Would patient like information on creating a medical advance directive?  Yes (MAU/Ambulatory/Procedural Areas - Information given) No - Patient declined No -  Patient declined No - Patient declined No - Patient declined Yes (MAU/Ambulatory/Procedural Areas - Information given)      Data saved with a previous flowsheet row definition    Current Medications (verified) Outpatient Encounter Medications as of 04/29/2024  Medication Sig   Accu-Chek Softclix Lancets lancets SMARTSIG:Topical 6 Times Daily   Alpha-Lipoic Acid 600 MG CAPS Take 1 capsule (600 mg total) by mouth daily. For nerves in leg   amLODipine  (NORVASC ) 10 MG tablet TAKE 1 TABLET BY MOUTH EVERY DAY   aspirin  EC 81 MG tablet Take 81 mg by mouth daily.   benazepril  (LOTENSIN ) 40 MG tablet TAKE 1 TABLET BY MOUTH EVERY DAY   Blood Glucose Monitoring Suppl (ACCU-CHEK GUIDE) w/Device KIT CHECK GLUCOSE SIX TIMES DAILY Dx E11.9   cetirizine  (ZYRTEC ) 5 MG tablet Take 1 tablet by mouth once daily   Cholecalciferol (VITAMIN D -3 PO) Take 1,000 Units by mouth in the morning and at bedtime.   Continuous Glucose Receiver (FREESTYLE LIBRE 3 READER) DEVI CHECK BLOOD GLUCOSE CONTINUOUSLY E11.9   Continuous Glucose Sensor (FREESTYLE LIBRE 2 SENSOR) MISC USE AS DIRECTED TO CHECK SUGARS AT LEAST 4 TIMES DAILY. E11.9   empagliflozin  (JARDIANCE ) 25 MG TABS tablet Take 1 tablet (25 mg total) by mouth daily before breakfast.   glucose blood (ACCU-CHEK GUIDE) test strip CHECK GLUCOSE SIX TIMES DAILY Dx E11.9   insulin  degludec (TRESIBA  FLEXTOUCH) 100 UNIT/ML FlexTouch Pen INJECT 20-30 UNITS INTO THE SKIN DAILY.   Insulin  Pen Needle (BD PEN NEEDLE NANO 2ND GEN) 32G X 4 MM MISC Use daily with insulin   pen Dx E11.9   metFORMIN  (GLUCOPHAGE ) 500 MG tablet Take 2 tablets (1,000 mg total) by mouth 2 (two) times daily with a meal.   metoprolol  tartrate (LOPRESSOR ) 25 MG tablet TAKE 1 TABLET BY MOUTH TWICE A DAY   omeprazole  (PRILOSEC) 40 MG capsule TAKE 1 CAPSULE BY MOUTH TWICE A DAY   pravastatin  (PRAVACHOL ) 40 MG tablet TAKE 1 TABLET BY MOUTH EVERY DAY   Semaglutide ,0.25 or 0.5MG /DOS, (OZEMPIC , 0.25 OR 0.5 MG/DOSE,) 2  MG/3ML SOPN INJECT 0.25MG  INTO THE SKIN ONE TIME PER WEEK   alendronate  (FOSAMAX ) 70 MG tablet TAKE 1 TABLET BY MOUTH ONCE A WEEK WITH  A  FULL  GLASS  OF  WATER   ON  AN  EMPTY  STOMACH (Patient not taking: Reported on 04/29/2024)   busPIRone  (BUSPAR ) 7.5 MG tablet TAKE 1 TABLET (7.5 MG TOTAL) BY MOUTH IN THE MORNING AND AT BEDTIME. FOR ANXIETY (Patient not taking: Reported on 04/29/2024)   carbamide peroxide (DEBROX) 6.5 % OTIC solution Place 5 drops into both ears 2 (two) times daily. (Patient not taking: Reported on 04/29/2024)   FLUoxetine  (PROZAC ) 10 MG capsule TAKE 1 CAPSULE BY MOUTH EVERY DAY (Patient not taking: Reported on 04/29/2024)   fluticasone  (FLONASE ) 50 MCG/ACT nasal spray Place 2 sprays into both nostrils daily. (Patient not taking: Reported on 04/29/2024)   hydrochlorothiazide  (MICROZIDE ) 12.5 MG capsule TAKE 1 CAPSULE BY MOUTH EVERY DAY (Patient not taking: Reported on 04/29/2024)   hydrocortisone (ANUSOL-HC) 2.5 % rectal cream Place 1 Application rectally 2 (two) times daily as needed (rectal pain x5 days max). (Patient not taking: Reported on 04/29/2024)   LINZESS  290 MCG CAPS capsule TAKE 1 CAPSULE BY MOUTH ONCE DAILY BEFORE BREAKFAST (Patient not taking: Reported on 04/29/2024)   naproxen  (NAPROSYN ) 500 MG tablet Take 1 tablet (500 mg total) by mouth 2 (two) times daily with a meal. As needed for knee pain/ swelling (Patient not taking: Reported on 04/29/2024)   nystatin  (MYCOSTATIN /NYSTOP ) powder Apply 1 Application topically 3 (three) times daily. (Patient not taking: Reported on 04/29/2024)   nystatin  cream (MYCOSTATIN ) Apply 1 Application topically 2 (two) times daily. X7-10 days (Patient not taking: Reported on 04/29/2024)   ondansetron  (ZOFRAN ) 4 MG tablet Take 1 tablet (4 mg total) by mouth every 8 (eight) hours as needed for nausea or vomiting. (Patient not taking: Reported on 04/29/2024)   psyllium (METAMUCIL) 58.6 % powder Take 1 packet by mouth daily. (Patient not  taking: Reported on 04/29/2024)   valACYclovir  (VALTREX ) 1000 MG tablet Take 1 tablet by mouth twice daily (Patient not taking: Reported on 04/29/2024)   No facility-administered encounter medications on file as of 04/29/2024.    Allergies (verified) Sulfa antibiotics and Sulfa drugs cross reactors   History: Past Medical History:  Diagnosis Date   Diabetes mellitus    GERD (gastroesophageal reflux disease)    Hyperlipidemia    Hypertension    Osteoporosis    Past Surgical History:  Procedure Laterality Date   COLONOSCOPY N/A 03/29/2016   Dr. harvey: Redundant left colon, nonbleeding internal hemorrhoids.  Consider colonoscopy in 10 to 15 years if benefits outweigh the risk    ESOPHAGOGASTRODUODENOSCOPY N/A 03/29/2016   Dr. harvey: Small hiatal hernia, small sessile polyps found in the gastric fundus, benign fundic gland polyps, mild inflammation and erythema in the gastric antrum.  Duodenum was normal.  Biopsy negative for celiac.  Schatzki ring, patent.   TOTAL HIP ARTHROPLASTY  09/03/2011   Procedure: TOTAL HIP ARTHROPLASTY;  Surgeon: Donnice BIRCH  Ernie, MD;  Location: [facility];  Service: Orthopedics;  Laterality: Left;   Family History  Problem Relation Age of Onset   Hypertension Mother    Hypertension Father    Diabetes Father    Hypertension Sister    Cancer Sister    Diabetes Brother    Diabetes Brother    Colon cancer Neg Hx    Social History   Socioeconomic History   Marital status: Widowed    Spouse name: Not on file   Number of children: 2   Years of education: 57   Highest education level: 12th grade  Occupational History   Occupation: retired  Tobacco Use   Smoking status: Never   Smokeless tobacco: Never  Vaping Use   Vaping status: Never Used  Substance and Sexual Activity   Alcohol use: Yes    Comment: occasional wine   Drug use: No   Sexual activity: Not Currently    Birth control/protection: None  Other Topics Concern   Not on file  Social  History Narrative   Lives alone. Daughters visit frequently   Social Drivers of Health   Financial Resource Strain: Low Risk  (04/29/2024)   Overall Financial Resource Strain (CARDIA)    Difficulty of Paying Living Expenses: Not hard at all  Food Insecurity: No Food Insecurity (04/29/2024)   Hunger Vital Sign    Worried About Running Out of Food in the Last Year: Never true    Ran Out of Food in the Last Year: Never true  Transportation Needs: No Transportation Needs (04/29/2024)   PRAPARE - Administrator, Civil Service (Medical): No    Lack of Transportation (Non-Medical): No  Physical Activity: Insufficiently Active (04/29/2024)   Exercise Vital Sign    Days of Exercise per Week: 3 days    Minutes of Exercise per Session: 30 min  Stress: No Stress Concern Present (04/29/2024)   [employer] of Occupational Health - Occupational Stress Questionnaire    Feeling of Stress: Not at all  Social Connections: Moderately Isolated (04/29/2024)   Social Connection and Isolation Panel    Frequency of Communication with Friends and Family: More than three times a week    Frequency of Social Gatherings with Friends and Family: More than three times a week    Attends Religious Services: 1 to 4 times per year    Active Member of [employer] or Organizations: No    Attends Banker Meetings: Never    Marital Status: Widowed    Tobacco Counseling Counseling given: Yes    Clinical Intake:  Pre-visit preparation completed: Yes  Pain : No/denies pain     BMI - recorded: 18.97 Nutritional Status: BMI <19  Underweight Nutritional Risks: None Diabetes: Yes  Lab Results  Component Value Date   HGBA1C 7.8 (H) 02/04/2024   HGBA1C 8.2 (H) 11/03/2023   HGBA1C 8.3 (H) 08/06/2023     How often do you need to have someone help you when you read instructions, pamphlets, or other written materials from your doctor or pharmacy?: 1 - Never  Interpreter Needed?:  No  Comments: pt c/o feeling bloated lately, stomach hurt, its has been going on for a week, per pt have appt next and to call pcp Information entered by :: alia t/cma   Activities of Daily Living     04/29/2024   10:55 AM  In your present state of health, do you have any difficulty performing the following activities:  Hearing? 0  Vision? 0  Difficulty concentrating or making decisions? 0  Walking or climbing stairs? 0  Dressing or bathing? 0  Doing errands, shopping? 0  Preparing Food and eating ? N  Using the Toilet? N  In the past six months, have you accidently leaked urine? N  Do you have problems with loss of bowel control? Y  Managing your Medications? N  Managing your Finances? N  Housekeeping or managing your Housekeeping? N    Patient Care Team: Jolinda Norene HERO, DO as PCP - General (Family Medicine) Harvey Margo CROME, MD (Inactive) as Consulting Physician (Gastroenterology) Billee Mliss BIRCH, [facility] (Pharmacist) Cindie Carlin POUR, DO as Consulting Physician (Internal Medicine)  I have updated your Care Teams any recent Medical Services you may have received from other providers in the past year.     Assessment:   This is a routine wellness examination for Susan Petty.  Hearing/Vision screen Hearing Screening - Comments:: Pt denies hearing dif Vision Screening - Comments:: Pt wear glasses/pt goes to [facility] in [place], [place]/last ov summer 2025   Goals Addressed             This Visit's Progress    Patient Stated   On track    04/26/2022 AWV Goal: Improved Nutrition/Diet  Patient will verbalize understanding that diet plays an important role in overall health and that a poor diet is a risk factor for many chronic medical conditions.  Over the next year, patient will improve self management of their diet by incorporating increased physical activity. Patient will utilize available community resources to help with food acquisition if needed (ex: food pantries, [address], etc) Patient will work with nutrition specialist if a referral was made        Depression Screen     04/29/2024   10:58 AM 04/05/2024   10:43 AM 02/27/2024    9:04 AM 02/04/2024    1:09 PM 11/03/2023   10:09 AM 08/06/2023    2:49 PM 05/21/2023   10:39 AM  PHQ 2/9 Scores  PHQ - 2 Score 2 0 0 0 0 0 0  PHQ- 9 Score 3 0  0 0 0 0    Fall Risk     04/29/2024   10:49 AM 04/05/2024   10:43 AM 02/27/2024    9:03 AM 02/04/2024    1:09 PM 11/03/2023   10:09 AM  Fall Risk   Falls in the past year? 0 0 0 0 0  Number falls in past yr: 0  0 0 0  Injury with Fall? 0  0 0 0  Risk for fall due to : No Fall Risks  No Fall Risks No Fall Risks No Fall Risks  Follow up Falls evaluation completed;Education provided Falls evaluation completed Falls evaluation completed Falls evaluation completed Falls evaluation completed    MEDICARE RISK AT HOME:  Medicare Risk at Home Any stairs in or around the home?: Yes If so, are there any without handrails?: Yes Home free of loose throw rugs in walkways, pet beds, electrical cords, etc?: Yes Adequate lighting in your home to reduce risk of falls?: Yes Life alert?: No Use of a cane, walker or w/c?: No Grab bars in the bathroom?: Yes Shower chair or bench in shower?: Yes Elevated toilet seat or a handicapped toilet?: Yes  TIMED UP AND GO:  Was the test performed?  no  Cognitive Function: 6CIT completed    04/14/2018   10:38 AM  MMSE - Mini Mental State Exam  Orientation to time 5  Orientation to Place 5  Registration 3  Attention/ Calculation 5  Recall 2  Language- name 2 objects 2  Language- repeat 1  Language- follow 3 step command 3  Language- read & follow direction 1  Write a sentence 1  Copy design 1  Total score 29        04/29/2024   11:07 AM 04/29/2023   10:40 AM 04/26/2022   10:25 AM 04/19/2020    9:46 AM 04/19/2019    9:58 AM  6CIT Screen  What Year? 0 points 0 points 0 points 0 points 0 points  What month? 0 points  0 points 0 points 0 points 0 points  What time? 0 points 0 points 0 points 0 points 0 points  Count back from 20 0 points 0 points 0 points 0 points 0 points  Months in reverse 0 points 0 points 2 points 0 points 0 points  Repeat phrase 0 points 0 points 0 points 0 points 0 points  Total Score 0 points 0 points 2 points 0 points 0 points    Immunizations Immunization History  Administered Date(s) Administered   Fluad Quad(high Dose 65+) 04/21/2019, 05/18/2020, 05/03/2021, 06/05/2022   Fluad Trivalent(High Dose 65+) 04/07/2023   INFLUENZA, HIGH DOSE SEASONAL PF 05/05/2013, 04/08/2017, 04/14/2018, 04/05/2024   Influenza,inj,Quad PF,6+ Mos 04/13/2014   Influenza-Unspecified 04/20/2012   Moderna Sars-Covid-2 Vaccination 09/13/2020, 11/07/2020, 03/28/2021   Pneumococcal Conjugate-13 03/14/2015   Pneumococcal Polysaccharide-23 04/14/2018   Td 10/18/1996   Tdap 05/16/2012    Screening Tests Health Maintenance  Topic Date Due   OPHTHALMOLOGY EXAM  08/19/2015   DEXA SCAN  05/18/2022   COVID-19 Vaccine (4 - 2025-26 season) 03/08/2024   Zoster Vaccines- Shingrix (1 of 2) 05/06/2024 (Originally 10/09/1993)   DTaP/Tdap/Td (3 - Td or Tdap) 08/05/2024 (Originally 05/16/2022)   Diabetic kidney evaluation - eGFR measurement  08/05/2024   Diabetic kidney evaluation - Urine ACR  08/05/2024   FOOT EXAM  08/05/2024   HEMOGLOBIN A1C  08/06/2024   Medicare Annual Wellness (AWV)  04/29/2025   Colonoscopy  03/29/2026   Pneumococcal Vaccine: 50+ Years  Completed   Influenza Vaccine  Completed   Meningococcal B Vaccine  Aged Out   Mammogram  Discontinued   Hepatitis C Screening  Discontinued    Health Maintenance Items Addressed: See Nurse Notes at the end of this note  Additional Screening:  Vision Screening: Recommended annual ophthalmology exams for early detection of glaucoma and other disorders of the eye. Is the patient up to date with their annual eye exam?  Yes  Who is the provider or  what is the name of the office in which the patient attends annual eye exams? [facility] in [facility]  Dental Screening: Recommended annual dental exams for proper oral hygiene  Community Resource Referral / Chronic Care Management: CRR required this visit?  No   CCM required this visit?  No   Plan:    I have personally reviewed and noted the following in the patient's chart:   Medical and social history Use of alcohol, tobacco or illicit drugs  Current medications and supplements including opioid prescriptions. Patient is not currently taking opioid prescriptions. Functional ability and status Nutritional status Physical activity Advanced directives List of other physicians Hospitalizations, surgeries, and ER visits in previous 12 months Vitals Screenings to include cognitive, depression, and falls Referrals and appointments  In addition, I have reviewed and discussed with patient certain preventive protocols, quality metrics, and  best practice recommendations. A written personalized care plan for preventive services as well as general preventive health recommendations were provided to patient.   Ozie Ned, CMA   04/29/2024   After Visit Summary: (MyChart) Due to this being a telephonic visit, the after visit summary with patients personalized plan was offered to patient via MyChart   Notes: Nothing significant to report at this time.

## 2024-05-01 ENCOUNTER — Other Ambulatory Visit: Payer: Self-pay | Admitting: Family Medicine

## 2024-05-01 DIAGNOSIS — M25562 Pain in left knee: Secondary | ICD-10-CM

## 2024-05-07 ENCOUNTER — Ambulatory Visit: Admitting: Family Medicine

## 2024-05-07 VITALS — BP 112/53 | HR 78 | Temp 97.7°F | Ht 65.0 in | Wt 119.1 lb

## 2024-05-07 DIAGNOSIS — E1169 Type 2 diabetes mellitus with other specified complication: Secondary | ICD-10-CM | POA: Diagnosis not present

## 2024-05-07 DIAGNOSIS — Z794 Long term (current) use of insulin: Secondary | ICD-10-CM

## 2024-05-07 DIAGNOSIS — I152 Hypertension secondary to endocrine disorders: Secondary | ICD-10-CM

## 2024-05-07 DIAGNOSIS — M810 Age-related osteoporosis without current pathological fracture: Secondary | ICD-10-CM

## 2024-05-07 DIAGNOSIS — E785 Hyperlipidemia, unspecified: Secondary | ICD-10-CM

## 2024-05-07 DIAGNOSIS — E1159 Type 2 diabetes mellitus with other circulatory complications: Secondary | ICD-10-CM | POA: Diagnosis not present

## 2024-05-07 DIAGNOSIS — E119 Type 2 diabetes mellitus without complications: Secondary | ICD-10-CM | POA: Diagnosis not present

## 2024-05-07 LAB — BAYER DCA HB A1C WAIVED: HB A1C (BAYER DCA - WAIVED): 7.8 % — ABNORMAL HIGH (ref 4.8–5.6)

## 2024-05-07 MED ORDER — OMEPRAZOLE 40 MG PO CPDR: 40.0000 mg | DELAYED_RELEASE_CAPSULE | Freq: Two times a day (BID) | ORAL | 4 refills | Status: AC

## 2024-05-07 MED ORDER — OZEMPIC (0.25 OR 0.5 MG/DOSE) 2 MG/3ML ~~LOC~~ SOPN: 0.2500 mg | PEN_INJECTOR | SUBCUTANEOUS | 3 refills | Status: AC

## 2024-05-07 MED ORDER — BENAZEPRIL HCL 40 MG PO TABS: 40.0000 mg | ORAL_TABLET | Freq: Every day | ORAL | 4 refills | Status: AC

## 2024-05-07 MED ORDER — ALENDRONATE SODIUM 70 MG PO TABS: ORAL_TABLET | ORAL | 3 refills | Status: AC

## 2024-05-07 MED ORDER — HYDROCHLOROTHIAZIDE 12.5 MG PO CAPS: 12.5000 mg | ORAL_CAPSULE | Freq: Every day | ORAL | 3 refills | Status: AC

## 2024-05-07 MED ORDER — AMLODIPINE BESYLATE 10 MG PO TABS: 10.0000 mg | ORAL_TABLET | Freq: Every day | ORAL | 4 refills | Status: AC

## 2024-05-07 MED ORDER — FLUOXETINE HCL 10 MG PO CAPS: 10.0000 mg | ORAL_CAPSULE | Freq: Every day | ORAL | 4 refills | Status: AC

## 2024-05-07 NOTE — Progress Notes (Signed)
 Subjective: CC:DM PCP: Jolinda Norene HERO, DO Susan Petty is a 78 y.o. female presenting to clinic today for:  Type 2 Diabetes with hypertension, hyperlipidemia:  Continues to use freestyle libre 3+ CGM.  At last visit her A1c was down only slightly to 7.8, from 8.2.  She is compliant with 16 units of Tresiba , 0.25 mg of Ozempic , 1000 mg twice daily of metformin  and 25 mg of Jardiance  daily.  She is compliant with blood pressure and cholesterol medications.  She does report a low blood sugar as low as 50s and this happens several times per month.  She brings in her freestyle libre today and notes that she is 41% time in target and 59% time above target.  Her blood sugar this morning was 80.   ROS: No reports of chest pain, shortness of breath, dizziness, genitourinary symptoms   Diabetes Health Maintenance Due  Topic Date Due   OPHTHALMOLOGY EXAM  08/19/2015   FOOT EXAM  08/05/2024   HEMOGLOBIN A1C  08/06/2024    ROS: Per HPI  Allergies  Allergen Reactions   Sulfa Antibiotics    Sulfa Drugs Cross Reactors Rash   Past Medical History:  Diagnosis Date   Diabetes mellitus    GERD (gastroesophageal reflux disease)    Hyperlipidemia    Hypertension    Osteoporosis     Current Outpatient Medications:    Accu-Chek Softclix Lancets lancets, SMARTSIG:Topical 6 Times Daily, Disp: , Rfl:    Alpha-Lipoic Acid 600 MG CAPS, Take 1 capsule (600 mg total) by mouth daily. For nerves in leg, Disp: 100 capsule, Rfl: 3   amLODipine  (NORVASC ) 10 MG tablet, TAKE 1 TABLET BY MOUTH EVERY DAY, Disp: 90 tablet, Rfl: 0   aspirin  EC 81 MG tablet, Take 81 mg by mouth daily., Disp: , Rfl:    benazepril  (LOTENSIN ) 40 MG tablet, TAKE 1 TABLET BY MOUTH EVERY DAY, Disp: 90 tablet, Rfl: 0   Blood Glucose Monitoring Suppl (ACCU-CHEK GUIDE) w/Device KIT, CHECK GLUCOSE SIX TIMES DAILY Dx E11.9, Disp: 1 kit, Rfl: 0   cetirizine  (ZYRTEC ) 5 MG tablet, Take 1 tablet by mouth once daily, Disp: 30 tablet,  Rfl: 0   Cholecalciferol (VITAMIN D -3 PO), Take 1,000 Units by mouth in the morning and at bedtime., Disp: , Rfl:    Continuous Glucose Receiver (FREESTYLE LIBRE 3 READER) DEVI, CHECK BLOOD GLUCOSE CONTINUOUSLY E11.9, Disp: 1 each, Rfl: 1   Continuous Glucose Sensor (FREESTYLE LIBRE 2 SENSOR) MISC, USE AS DIRECTED TO CHECK SUGARS AT LEAST 4 TIMES DAILY. E11.9, Disp: 6 each, Rfl: 4   empagliflozin  (JARDIANCE ) 25 MG TABS tablet, Take 1 tablet (25 mg total) by mouth daily before breakfast., Disp: 90 tablet, Rfl: 3   FLUoxetine  (PROZAC ) 10 MG capsule, TAKE 1 CAPSULE BY MOUTH EVERY DAY (Patient not taking: Reported on 04/29/2024), Disp: 90 capsule, Rfl: 0   glucose blood (ACCU-CHEK GUIDE) test strip, CHECK GLUCOSE SIX TIMES DAILY Dx E11.9, Disp: 600 each, Rfl: 3   hydrochlorothiazide  (MICROZIDE ) 12.5 MG capsule, TAKE 1 CAPSULE BY MOUTH EVERY DAY (Patient not taking: Reported on 04/29/2024), Disp: 90 capsule, Rfl: 0   hydrocortisone (ANUSOL-HC) 2.5 % rectal cream, Place 1 Application rectally 2 (two) times daily as needed (rectal pain x5 days max). (Patient not taking: Reported on 04/29/2024), Disp: 30 g, Rfl: 0   insulin  degludec (TRESIBA  FLEXTOUCH) 100 UNIT/ML FlexTouch Pen, INJECT 20-30 UNITS INTO THE SKIN DAILY., Disp: 18 mL, Rfl: 0   Insulin  Pen Needle (BD PEN NEEDLE  NANO 2ND GEN) 32G X 4 MM MISC, Use daily with insulin  pen Dx E11.9, Disp: 100 each, Rfl: 3   metFORMIN  (GLUCOPHAGE ) 500 MG tablet, Take 2 tablets (1,000 mg total) by mouth 2 (two) times daily with a meal., Disp: 360 tablet, Rfl: 3   metoprolol  tartrate (LOPRESSOR ) 25 MG tablet, TAKE 1 TABLET BY MOUTH TWICE A DAY, Disp: 180 tablet, Rfl: 0   naproxen  (NAPROSYN ) 500 MG tablet, TAKE 1 TABLET TWICE DAILY WITH A MEAL. AS NEEDED FOR KNEE PAIN/ SWELLING, Disp: 180 tablet, Rfl: 3   omeprazole  (PRILOSEC) 40 MG capsule, TAKE 1 CAPSULE BY MOUTH TWICE A DAY, Disp: 180 capsule, Rfl: 0   pravastatin  (PRAVACHOL ) 40 MG tablet, TAKE 1 TABLET BY MOUTH EVERY  DAY, Disp: 90 tablet, Rfl: 0   Semaglutide ,0.25 or 0.5MG /DOS, (OZEMPIC , 0.25 OR 0.5 MG/DOSE,) 2 MG/3ML SOPN, INJECT 0.25MG  INTO THE SKIN ONE TIME PER WEEK, Disp: 9 mL, Rfl: 0 Social History   Socioeconomic History   Marital status: Widowed    Spouse name: Not on file   Number of children: 2   Years of education: 74   Highest education level: 12th grade  Occupational History   Occupation: retired  Tobacco Use   Smoking status: Never   Smokeless tobacco: Never  Vaping Use   Vaping status: Never Used  Substance and Sexual Activity   Alcohol use: Yes    Comment: occasional wine   Drug use: No   Sexual activity: Not Currently    Birth control/protection: None  Other Topics Concern   Not on file  Social History Narrative   Lives alone. Daughters visit frequently   Social Drivers of Health   Financial Resource Strain: Low Risk  (05/07/2024)   Overall Financial Resource Strain (CARDIA)    Difficulty of Paying Living Expenses: Not hard at all  Food Insecurity: No Food Insecurity (05/07/2024)   Hunger Vital Sign    Worried About Running Out of Food in the Last Year: Never true    Ran Out of Food in the Last Year: Never true  Transportation Needs: No Transportation Needs (05/07/2024)   PRAPARE - Administrator, Civil Service (Medical): No    Lack of Transportation (Non-Medical): No  Physical Activity: Insufficiently Active (05/07/2024)   Exercise Vital Sign    Days of Exercise per Week: 3 days    Minutes of Exercise per Session: 30 min  Stress: No Stress Concern Present (05/07/2024)   [employer] of Occupational Health - Occupational Stress Questionnaire    Feeling of Stress: Not at all  Social Connections: Moderately Isolated (05/07/2024)   Social Connection and Isolation Panel    Frequency of Communication with Friends and Family: More than three times a week    Frequency of Social Gatherings with Friends and Family: More than three times a week    Attends  Religious Services: More than 4 times per year    Active Member of [employer] or Organizations: No    Attends Banker Meetings: Not on file    Marital Status: Widowed  Intimate Partner Violence: Not At Risk (04/29/2024)   Humiliation, Afraid, Rape, and Kick questionnaire    Fear of Current or Ex-Partner: No    Emotionally Abused: No    Physically Abused: No    Sexually Abused: No   Family History  Problem Relation Age of Onset   Hypertension Mother    Hypertension Father    Diabetes Father    Hypertension Sister  Cancer Sister    Diabetes Brother    Diabetes Brother    Colon cancer Neg Hx     Objective: Office vital signs reviewed. BP (!) 112/53   Pulse 78   Temp 97.7 F (36.5 C)   Ht 5' 5 (1.651 m)   Wt 119 lb 2 oz (54 kg)   SpO2 97%   BMI 19.82 kg/m   Physical Examination:  General: Awake, alert, well nourished, No acute distress HEENT: Sclera white.  Moist mucous membranes Cardio: regular rate and rhythm, S1S2 heard, no murmurs appreciated Pulm: clear to auscultation bilaterally, no wheezes, rhonchi or rales; normal work of breathing on room air GI: soft, non-tender, non-distended, bowel sounds present x4, no hepatomegaly, no splenomegaly, no masses Extremities: warm, well perfused, No edema, cyanosis or clubbing; +2 pulses bilaterally    Lab Results  Component Value Date   HGBA1C 7.8 (H) 02/04/2024    Assessment/ Plan: 78 y.o. female   Type 2 diabetes mellitus with insulin  therapy (HCC) - Plan: Bayer DCA Hb A1c Waived, Basic Metabolic Panel, Semaglutide ,0.25 or 0.5MG /DOS, (OZEMPIC , 0.25 OR 0.5 MG/DOSE,) 2 MG/3ML SOPN  Hypertension associated with type 2 diabetes mellitus (HCC) - Plan: Basic Metabolic Panel, amLODipine  (NORVASC ) 10 MG tablet, benazepril  (LOTENSIN ) 40 MG tablet, hydrochlorothiazide  (MICROZIDE ) 12.5 MG capsule  Hyperlipidemia associated with type 2 diabetes mellitus (HCC)  Age-related osteoporosis without current pathological  fracture - Plan: alendronate  (FOSAMAX ) 70 MG tablet  Ozempic  renewed but I wonder if maybe she would be better off with something like Soliqua.  We will plan to reevaluate this at the new year as Tresiba  will no longer be covered by her insurance anyways.  Her blood pressure is controlled.  Medications renewed.  Check renal function.  Continue statin.  I offered DEXA today but she declined.  Alendronate  renewed   Norene CHRISTELLA Fielding, DO [facility] [phone]

## 2024-05-08 LAB — BASIC METABOLIC PANEL WITH GFR
BUN/Creatinine Ratio: 19 (ref 12–28)
BUN: 17 mg/dL (ref 8–27)
CO2: 25 mmol/L (ref 20–29)
Calcium: 9.7 mg/dL (ref 8.7–10.3)
Chloride: 100 mmol/L (ref 96–106)
Creatinine, Ser: 0.9 mg/dL (ref 0.57–1.00)
Glucose: 242 mg/dL — ABNORMAL HIGH (ref 70–99)
Potassium: 4.3 mmol/L (ref 3.5–5.2)
Sodium: 140 mmol/L (ref 134–144)
eGFR: 65 mL/min/1.73 (ref >59)

## 2024-05-10 ENCOUNTER — Ambulatory Visit: Payer: Self-pay | Admitting: Family Medicine

## 2024-06-02 ENCOUNTER — Ambulatory Visit: Admitting: Family Medicine

## 2024-06-02 ENCOUNTER — Encounter: Payer: Self-pay | Admitting: Family Medicine

## 2024-06-02 VITALS — BP 137/62 | HR 62 | Temp 97.8°F | Ht 65.0 in | Wt 117.4 lb

## 2024-06-02 DIAGNOSIS — N760 Acute vaginitis: Secondary | ICD-10-CM | POA: Diagnosis not present

## 2024-06-02 DIAGNOSIS — K6289 Other specified diseases of anus and rectum: Secondary | ICD-10-CM

## 2024-06-02 DIAGNOSIS — R300 Dysuria: Secondary | ICD-10-CM | POA: Diagnosis not present

## 2024-06-02 LAB — MICROSCOPIC EXAMINATION
Bacteria, UA: NONE SEEN
RBC, Urine: NONE SEEN /HPF (ref 0–2)
Renal Epithel, UA: NONE SEEN /HPF
Yeast, UA: NONE SEEN

## 2024-06-02 LAB — URINALYSIS, COMPLETE
Bilirubin, UA: NEGATIVE
Ketones, UA: NEGATIVE
Leukocytes,UA: NEGATIVE
Nitrite, UA: NEGATIVE
Protein,UA: NEGATIVE
RBC, UA: NEGATIVE
Specific Gravity, UA: 1.015 (ref 1.005–1.030)
Urobilinogen, Ur: 0.2 mg/dL (ref 0.2–1.0)
pH, UA: 7 (ref 5.0–7.5)

## 2024-06-02 MED ORDER — FLUCONAZOLE 150 MG PO TABS: 150.0000 mg | ORAL_TABLET | Freq: Once | ORAL | 0 refills | Status: AC

## 2024-06-02 MED ORDER — HYDROCORTISONE (PERIANAL) 2.5 % EX CREA: 1.0000 | TOPICAL_CREAM | Freq: Two times a day (BID) | CUTANEOUS | 0 refills | Status: DC | PRN

## 2024-06-02 NOTE — Progress Notes (Signed)
 Subjective: RR:ibdlmpj PCP: Jolinda Norene HERO, DO YEP:Susan Petty is a 78 y.o. female presenting to clinic today for:  She reports a couple day history of dysuria.  She started developing some vaginal itching today.  She did some urinary tract AZO strips but did not have any success at reading them.  She reports dysuria is actually slightly better today.  No fevers, flank pain, nausea or vomiting reported.  Sometimes she gets lower abdominal pain and bloating at nighttime but this seems to be more consistent with her GLP use not due to the current issues.  She does use Jardiance  and does bathe well.  She also wants to get her rectal cream recent.   ROS: Per HPI  Allergies  Allergen Reactions   Sulfa Antibiotics    Sulfa Drugs Cross Reactors Rash   Past Medical History:  Diagnosis Date   Diabetes mellitus    GERD (gastroesophageal reflux disease)    Hyperlipidemia    Hypertension    Osteoporosis     Current Outpatient Medications:    Accu-Chek Softclix Lancets lancets, SMARTSIG:Topical 6 Times Daily, Disp: , Rfl:    alendronate  (FOSAMAX ) 70 MG tablet, TAKE 1 TABLET BY MOUTH ONCE A WEEK WITH  A  FULL  GLASS  OF  WATER   ON  AN  EMPTY  STOMACH, Disp: 12 tablet, Rfl: 3   Alpha-Lipoic Acid 600 MG CAPS, Take 1 capsule (600 mg total) by mouth daily. For nerves in leg, Disp: 100 capsule, Rfl: 3   amLODipine  (NORVASC ) 10 MG tablet, Take 1 tablet (10 mg total) by mouth daily., Disp: 90 tablet, Rfl: 4   aspirin  EC 81 MG tablet, Take 81 mg by mouth daily., Disp: , Rfl:    benazepril  (LOTENSIN ) 40 MG tablet, Take 1 tablet (40 mg total) by mouth daily., Disp: 90 tablet, Rfl: 4   Blood Glucose Monitoring Suppl (ACCU-CHEK GUIDE) w/Device KIT, CHECK GLUCOSE SIX TIMES DAILY Dx E11.9, Disp: 1 kit, Rfl: 0   cetirizine  (ZYRTEC ) 5 MG tablet, Take 1 tablet by mouth once daily, Disp: 30 tablet, Rfl: 0   Cholecalciferol (VITAMIN D -3 PO), Take 1,000 Units by mouth in the morning and at bedtime.,  Disp: , Rfl:    Continuous Glucose Receiver (FREESTYLE LIBRE 3 READER) DEVI, CHECK BLOOD GLUCOSE CONTINUOUSLY E11.9, Disp: 1 each, Rfl: 1   Continuous Glucose Sensor (FREESTYLE LIBRE 2 SENSOR) MISC, USE AS DIRECTED TO CHECK SUGARS AT LEAST 4 TIMES DAILY. E11.9, Disp: 6 each, Rfl: 4   empagliflozin  (JARDIANCE ) 25 MG TABS tablet, Take 1 tablet (25 mg total) by mouth daily before breakfast., Disp: 90 tablet, Rfl: 3   FLUoxetine  (PROZAC ) 10 MG capsule, Take 1 capsule (10 mg total) by mouth daily., Disp: 90 capsule, Rfl: 4   glucose blood (ACCU-CHEK GUIDE) test strip, CHECK GLUCOSE SIX TIMES DAILY Dx E11.9, Disp: 600 each, Rfl: 3   hydrochlorothiazide  (MICROZIDE ) 12.5 MG capsule, Take 1 capsule (12.5 mg total) by mouth daily., Disp: 90 capsule, Rfl: 3   hydrocortisone  (ANUSOL -HC) 2.5 % rectal cream, Place 1 Application rectally 2 (two) times daily as needed (rectal pain x5 days max). (Patient not taking: Reported on 04/29/2024), Disp: 30 g, Rfl: 0   insulin  degludec (TRESIBA  FLEXTOUCH) 100 UNIT/ML FlexTouch Pen, INJECT 20-30 UNITS INTO THE SKIN DAILY., Disp: 18 mL, Rfl: 0   Insulin  Pen Needle (BD PEN NEEDLE NANO 2ND GEN) 32G X 4 MM MISC, Use daily with insulin  pen Dx E11.9, Disp: 100 each, Rfl: 3  metFORMIN  (GLUCOPHAGE ) 500 MG tablet, Take 2 tablets (1,000 mg total) by mouth 2 (two) times daily with a meal., Disp: 360 tablet, Rfl: 3   metoprolol  tartrate (LOPRESSOR ) 25 MG tablet, TAKE 1 TABLET BY MOUTH TWICE A DAY, Disp: 180 tablet, Rfl: 0   naproxen  (NAPROSYN ) 500 MG tablet, TAKE 1 TABLET TWICE DAILY WITH A MEAL. AS NEEDED FOR KNEE PAIN/ SWELLING, Disp: 180 tablet, Rfl: 3   omeprazole  (PRILOSEC) 40 MG capsule, Take 1 capsule (40 mg total) by mouth 2 (two) times daily., Disp: 180 capsule, Rfl: 4   pravastatin  (PRAVACHOL ) 40 MG tablet, TAKE 1 TABLET BY MOUTH EVERY DAY, Disp: 90 tablet, Rfl: 0   Semaglutide ,0.25 or 0.5MG /DOS, (OZEMPIC , 0.25 OR 0.5 MG/DOSE,) 2 MG/3ML SOPN, Inject 0.25 mg into the skin every  7 (seven) days., Disp: 9 mL, Rfl: 3 Social History   Socioeconomic History   Marital status: Widowed    Spouse name: Not on file   Number of children: 2   Years of education: 5   Highest education level: 12th grade  Occupational History   Occupation: retired  Tobacco Use   Smoking status: Never   Smokeless tobacco: Never  Vaping Use   Vaping status: Never Used  Substance and Sexual Activity   Alcohol use: Yes    Comment: occasional wine   Drug use: No   Sexual activity: Not Currently    Birth control/protection: None  Other Topics Concern   Not on file  Social History Narrative   Lives alone. Daughters visit frequently   Social Drivers of Health   Financial Resource Strain: Low Risk  (05/07/2024)   Overall Financial Resource Strain (CARDIA)    Difficulty of Paying Living Expenses: Not hard at all  Food Insecurity: No Food Insecurity (05/07/2024)   Hunger Vital Sign    Worried About Running Out of Food in the Last Year: Never true    Ran Out of Food in the Last Year: Never true  Transportation Needs: No Transportation Needs (05/07/2024)   PRAPARE - Administrator, Civil Service (Medical): No    Lack of Transportation (Non-Medical): No  Physical Activity: Insufficiently Active (05/07/2024)   Exercise Vital Sign    Days of Exercise per Week: 3 days    Minutes of Exercise per Session: 30 min  Stress: No Stress Concern Present (05/07/2024)   [employer] of Occupational Health - Occupational Stress Questionnaire    Feeling of Stress: Not at all  Social Connections: Moderately Isolated (05/07/2024)   Social Connection and Isolation Panel    Frequency of Communication with Friends and Family: More than three times a week    Frequency of Social Gatherings with Friends and Family: More than three times a week    Attends Religious Services: More than 4 times per year    Active Member of [employer] or Organizations: No    Attends Banker Meetings:  Not on file    Marital Status: Widowed  Intimate Partner Violence: Not At Risk (04/29/2024)   Humiliation, Afraid, Rape, and Kick questionnaire    Fear of Current or Ex-Partner: No    Emotionally Abused: No    Physically Abused: No    Sexually Abused: No   Family History  Problem Relation Age of Onset   Hypertension Mother    Hypertension Father    Diabetes Father    Hypertension Sister    Cancer Sister    Diabetes Brother    Diabetes Brother  Colon cancer Neg Hx     Objective: Office vital signs reviewed. BP 137/62   Pulse 62   Temp 97.8 F (36.6 C)   Ht 5' 5 (1.651 m)   Wt 117 lb 6 oz (53.2 kg)   SpO2 100%   BMI 19.53 kg/m   Physical Examination:  General: Awake, alert, well nourished, No acute distress GU: No suprapubic tenderness palpation.  No CVA tenderness palpation. Assessment/ Plan: 78 y.o. female   Acute vaginitis - Plan: fluconazole  (DIFLUCAN ) 150 MG tablet  Dysuria - Plan: Urinalysis, Complete, Urine Culture  Rectal pain - Plan: hydrocortisone  (ANUSOL -HC) 2.5 % rectal cream   No UTI.  Sent Diflucan  for presumed yeast vaginitis given use of Jardiance .  Discussed how to keep area clean, dry and use barrier cream to reduce risk of recurrence.  Hydrocortisone  rectal cream sent.  Follow-up as needed   Norene CHRISTELLA Fielding, DO [facility] [phone]

## 2024-06-04 LAB — URINE CULTURE

## 2024-06-27 ENCOUNTER — Other Ambulatory Visit: Payer: Self-pay | Admitting: Family Medicine

## 2024-06-27 DIAGNOSIS — E119 Type 2 diabetes mellitus without complications: Secondary | ICD-10-CM

## 2024-06-27 DIAGNOSIS — B9689 Other specified bacterial agents as the cause of diseases classified elsewhere: Secondary | ICD-10-CM

## 2024-07-07 ENCOUNTER — Other Ambulatory Visit: Payer: Self-pay | Admitting: Family Medicine

## 2024-07-07 DIAGNOSIS — E1169 Type 2 diabetes mellitus with other specified complication: Secondary | ICD-10-CM

## 2024-07-19 ENCOUNTER — Other Ambulatory Visit: Payer: Self-pay | Admitting: Family Medicine

## 2024-07-19 DIAGNOSIS — E119 Type 2 diabetes mellitus without complications: Secondary | ICD-10-CM

## 2024-07-19 MED ORDER — LANTUS SOLOSTAR 100 UNIT/ML ~~LOC~~ SOPN: 16.0000 [IU] | PEN_INJECTOR | Freq: Every day | SUBCUTANEOUS | 99 refills | Status: AC

## 2024-07-24 ENCOUNTER — Other Ambulatory Visit: Payer: Self-pay | Admitting: Family Medicine

## 2024-07-24 DIAGNOSIS — E1159 Type 2 diabetes mellitus with other circulatory complications: Secondary | ICD-10-CM

## 2024-07-27 ENCOUNTER — Ambulatory Visit: Admitting: Family Medicine

## 2024-07-27 ENCOUNTER — Ambulatory Visit: Payer: Self-pay | Admitting: Family Medicine

## 2024-07-27 ENCOUNTER — Encounter: Payer: Self-pay | Admitting: Family Medicine

## 2024-07-27 VITALS — BP 135/75 | HR 71 | Temp 97.8°F | Ht 65.0 in | Wt 119.4 lb

## 2024-07-27 DIAGNOSIS — Z794 Long term (current) use of insulin: Secondary | ICD-10-CM

## 2024-07-27 DIAGNOSIS — E1169 Type 2 diabetes mellitus with other specified complication: Secondary | ICD-10-CM

## 2024-07-27 DIAGNOSIS — K6289 Other specified diseases of anus and rectum: Secondary | ICD-10-CM

## 2024-07-27 DIAGNOSIS — E785 Hyperlipidemia, unspecified: Secondary | ICD-10-CM

## 2024-07-27 DIAGNOSIS — M810 Age-related osteoporosis without current pathological fracture: Secondary | ICD-10-CM

## 2024-07-27 DIAGNOSIS — R399 Unspecified symptoms and signs involving the genitourinary system: Secondary | ICD-10-CM | POA: Diagnosis not present

## 2024-07-27 DIAGNOSIS — E1159 Type 2 diabetes mellitus with other circulatory complications: Secondary | ICD-10-CM

## 2024-07-27 DIAGNOSIS — I152 Hypertension secondary to endocrine disorders: Secondary | ICD-10-CM

## 2024-07-27 DIAGNOSIS — N760 Acute vaginitis: Secondary | ICD-10-CM | POA: Diagnosis not present

## 2024-07-27 DIAGNOSIS — E119 Type 2 diabetes mellitus without complications: Secondary | ICD-10-CM

## 2024-07-27 LAB — MICROSCOPIC EXAMINATION

## 2024-07-27 LAB — URINALYSIS, ROUTINE W REFLEX MICROSCOPIC
Bilirubin, UA: NEGATIVE
Ketones, UA: NEGATIVE
Leukocytes,UA: NEGATIVE
Nitrite, UA: NEGATIVE
Protein,UA: NEGATIVE
Specific Gravity, UA: 1.015 (ref 1.005–1.030)
Urobilinogen, Ur: 0.2 mg/dL (ref 0.2–1.0)
pH, UA: 7 (ref 5.0–7.5)

## 2024-07-27 LAB — BAYER DCA HB A1C WAIVED: HB A1C (BAYER DCA - WAIVED): 7.5 % — ABNORMAL HIGH (ref 4.8–5.6)

## 2024-07-27 MED ORDER — HYDROCORTISONE (PERIANAL) 2.5 % EX CREA: 1.0000 | TOPICAL_CREAM | Freq: Two times a day (BID) | CUTANEOUS | 0 refills | Status: AC | PRN

## 2024-07-27 MED ORDER — FLUCONAZOLE 150 MG PO TABS: 150.0000 mg | ORAL_TABLET | ORAL | 0 refills | Status: AC

## 2024-07-27 MED ORDER — CLOBETASOL PROPIONATE 0.05 % EX CREA: TOPICAL_CREAM | CUTANEOUS | 1 refills | Status: AC

## 2024-07-27 NOTE — Progress Notes (Signed)
 "  Subjective: CC:DM PCP: Jolinda Norene HERO, DO Susan Petty is a 79 y.o. female presenting to clinic today for:  Type 2 Diabetes with hypertension, hyperlipidemia:  Glucometer: Utilizing freestyle libre 3.  She is 46% time in target glucose level.  She is currently injecting 16 units of her long-acting insulin .  She has had a few blood sugars that flagged for hypoglycemia but when she did a fingerstick check her fingerstick was in normal range.  She admits that she still did have orange juice or something sugary because she was not sure which level to trust even though she did not get a note saying that she was having a recall on her current CGM because of false readings.  She reports vaginal irritation and itching.  Wondering if maybe she needs to be on Diflucan  longer.  Denies any dysuria but would like urine checked.  Would also like refills on her rectal hemorrhoid cream.  Would like to defer DEXA to next visit.   Diabetes Health Maintenance Due  Topic Date Due   FOOT EXAM  08/05/2024   OPHTHALMOLOGY EXAM  09/09/2024   HEMOGLOBIN A1C  11/04/2024    ROS: Per HPI  Allergies[1] Past Medical History:  Diagnosis Date   Diabetes mellitus    GERD (gastroesophageal reflux disease)    Hyperlipidemia    Hypertension    Osteoporosis    Current Medications[2] Social History   Socioeconomic History   Marital status: Widowed    Spouse name: Not on file   Number of children: 2   Years of education: 69   Highest education level: 12th grade  Occupational History   Occupation: retired  Tobacco Use   Smoking status: Never   Smokeless tobacco: Never  Vaping Use   Vaping status: Never Used  Substance and Sexual Activity   Alcohol use: Yes    Comment: occasional wine   Drug use: No   Sexual activity: Not Currently    Birth control/protection: None  Other Topics Concern   Not on file  Social History Narrative   Lives alone. Daughters visit frequently   Social Drivers  of Health   Tobacco Use: Low Risk (06/02/2024)   Patient History    Smoking Tobacco Use: Never    Smokeless Tobacco Use: Never    Passive Exposure: Not on file  Financial Resource Strain: Low Risk (05/07/2024)   Overall Financial Resource Strain (CARDIA)    Difficulty of Paying Living Expenses: Not hard at all  Food Insecurity: No Food Insecurity (05/07/2024)   Epic    Worried About Programme Researcher, Broadcasting/film/video in the Last Year: Never true    Ran Out of Food in the Last Year: Never true  Transportation Needs: No Transportation Needs (05/07/2024)   Epic    Lack of Transportation (Medical): No    Lack of Transportation (Non-Medical): No  Physical Activity: Insufficiently Active (05/07/2024)   Exercise Vital Sign    Days of Exercise per Week: 3 days    Minutes of Exercise per Session: 30 min  Stress: No Stress Concern Present (05/07/2024)   [employer] of Occupational Health - Occupational Stress Questionnaire    Feeling of Stress: Not at all  Social Connections: Moderately Isolated (05/07/2024)   Social Connection and Isolation Panel    Frequency of Communication with Friends and Family: More than three times a week    Frequency of Social Gatherings with Friends and Family: More than three times a week    Attends Religious  Services: More than 4 times per year    Active Member of Clubs or Organizations: No    Attends Banker Meetings: Not on file    Marital Status: Widowed  Intimate Partner Violence: Not At Risk (04/29/2024)   Epic    Fear of Current or Ex-Partner: No    Emotionally Abused: No    Physically Abused: No    Sexually Abused: No  Depression (PHQ2-9): Low Risk (06/02/2024)   Depression (PHQ2-9)    PHQ-2 Score: 0  Alcohol Screen: Low Risk (04/29/2024)   Alcohol Screen    Last Alcohol Screening Score (AUDIT): 0  Housing: Unknown (05/07/2024)   Epic    Unable to Pay for Housing in the Last Year: No    Number of Times Moved in the Last Year: Not on file     Homeless in the Last Year: No  Utilities: Not At Risk (04/29/2024)   Epic    Threatened with loss of utilities: No  Health Literacy: Adequate Health Literacy (04/29/2024)   B1300 Health Literacy    Frequency of need for help with medical instructions: Never   Family History  Problem Relation Age of Onset   Hypertension Mother    Hypertension Father    Diabetes Father    Hypertension Sister    Cancer Sister    Diabetes Brother    Diabetes Brother    Colon cancer Neg Hx     Objective: Office vital signs reviewed. BP 135/75   Pulse 71   Temp 97.8 F (36.6 C)   Ht 5' 5 (1.651 m)   Wt 119 lb 6 oz (54.1 kg)   SpO2 99%   BMI 19.87 kg/m   Physical Examination:  General: Awake, alert, well nourished, No acute distress HEENT: Sclera white.  Moist mucous membranes Cardio: regular rate and rhythm, S1S2 heard, no murmurs appreciated Pulm: clear to auscultation bilaterally, no wheezes, rhonchi or rales; normal work of breathing on room air GI: soft, non-tender, non-distended, bowel sounds present x4, no hepatomegaly, no splenomegaly, no masses GU: external vaginal tissue atrophic, she has scant white discharge within the vaginal vault.  No lesions appreciated externally except for some sclerosis of the introitus noted.    Lab Results  Component Value Date   HGBA1C 7.8 (H) 05/07/2024    Assessment/ Plan: 79 y.o. female   Type 2 diabetes mellitus with insulin  therapy (HCC) - Plan: Microalbumin / creatinine urine ratio, Bayer DCA Hb A1c Waived, CMP14+EGFR  Hypertension associated with type 2 diabetes mellitus (HCC) - Plan: CMP14+EGFR  Hyperlipidemia associated with type 2 diabetes mellitus (HCC) - Plan: CMP14+EGFR, Lipid Panel  Age-related osteoporosis without current pathological fracture - Plan: CMP14+EGFR, VITAMIN D  25 Hydroxy (Vit-D Deficiency, Fractures)  Urinary symptom or sign - Plan: Urinalysis, Routine w reflex microscopic, WET PREP FOR TRICH, YEAST,  CLUE  Vulvovaginitis - Plan: fluconazole  (DIFLUCAN ) 150 MG tablet, clobetasol  cream (TEMOVATE ) 0.05 %  Check urine microalbumin.  Urinalysis without evidence of UTI but sugar appreciated.  I am going to treat her with 3 weeks of Diflucan  to ensure that we are not dealing with a yeast vaginitis given use of SGLT2.  However, I am more concerned that maybe she has a lichen sclerosus of the vulva and so we will trial her on clobetasol  cream applied to the affected area tapered to twice weekly use.  Continue all medications as prescribed for now.  Follow-up in 3 months, sooner if concerns arise   Norene CHRISTELLA Fielding, DO [facility] [phone]     [1]  Allergies Allergen Reactions   Sulfa Antibiotics    Sulfa Drugs Cross Reactors Rash  [2]  Current Outpatient Medications:    Accu-Chek Softclix Lancets lancets, SMARTSIG:Topical 6 Times Daily, Disp: , Rfl:    alendronate  (FOSAMAX ) 70 MG tablet, TAKE 1 TABLET BY MOUTH ONCE A WEEK WITH  A  FULL  GLASS  OF  WATER   ON  AN  EMPTY  STOMACH, Disp: 12 tablet, Rfl: 3   Alpha-Lipoic Acid 600 MG CAPS, Take 1 capsule (600 mg total) by mouth daily. For nerves in leg, Disp: 100 capsule, Rfl: 3   amLODipine  (NORVASC ) 10 MG tablet, Take 1 tablet (10 mg total) by mouth daily., Disp: 90 tablet, Rfl: 4   aspirin  EC 81 MG tablet, Take 81 mg by mouth daily., Disp: , Rfl:    benazepril  (LOTENSIN ) 40 MG tablet, Take 1 tablet (40 mg total) by mouth daily., Disp: 90 tablet, Rfl: 4   Blood Glucose Monitoring Suppl (ACCU-CHEK GUIDE) w/Device KIT, CHECK GLUCOSE SIX TIMES DAILY Dx E11.9, Disp: 1 kit, Rfl: 0   cetirizine  (ZYRTEC ) 5 MG tablet, Take 1 tablet by mouth once daily, Disp: 30 tablet, Rfl: 0   Cholecalciferol (VITAMIN D -3 PO), Take 1,000 Units by mouth in the morning and at bedtime., Disp: , Rfl:    Continuous Glucose Receiver (FREESTYLE LIBRE 3 READER) DEVI, CHECK BLOOD GLUCOSE CONTINUOUSLY E11.9, Disp: 1 each, Rfl: 1   Continuous  Glucose Sensor (FREESTYLE LIBRE 2 SENSOR) MISC, USE AS DIRECTED TO CHECK SUGARS AT LEAST 4 TIMES DAILY. E11.9, Disp: 6 each, Rfl: 4   EMBECTA PEN NEEDLE NANO 2 GEN 32G X 4 MM MISC, USE DAILY WITH INSULIN  PEN DX E11.9, Disp: 100 each, Rfl: 3   empagliflozin  (JARDIANCE ) 25 MG TABS tablet, Take 1 tablet (25 mg total) by mouth daily before breakfast., Disp: 90 tablet, Rfl: 3   FLUoxetine  (PROZAC ) 10 MG capsule, Take 1 capsule (10 mg total) by mouth daily., Disp: 90 capsule, Rfl: 4   glucose blood (ACCU-CHEK GUIDE) test strip, CHECK GLUCOSE SIX TIMES DAILY Dx E11.9, Disp: 600 each, Rfl: 3   hydrochlorothiazide  (MICROZIDE ) 12.5 MG capsule, Take 1 capsule (12.5 mg total) by mouth daily., Disp: 90 capsule, Rfl: 3   hydrocortisone  (ANUSOL -HC) 2.5 % rectal cream, Place 1 Application rectally 2 (two) times daily as needed (rectal pain x5 days max)., Disp: 30 g, Rfl: 0   insulin  glargine (LANTUS  SOLOSTAR) 100 UNIT/ML Solostar Pen, Inject 16-20 Units into the skin daily. **please inform that ins no longer covering tresiba ., Disp: 15 mL, Rfl: PRN   metFORMIN  (GLUCOPHAGE ) 500 MG tablet, Take 2 tablets (1,000 mg total) by mouth 2 (two) times daily with a meal., Disp: 360 tablet, Rfl: 3   metoprolol  tartrate (LOPRESSOR ) 25 MG tablet, TAKE 1 TABLET BY MOUTH TWICE A DAY, Disp: 180 tablet, Rfl: 0   naproxen  (NAPROSYN ) 500 MG tablet, TAKE 1 TABLET TWICE DAILY WITH A MEAL. AS NEEDED FOR KNEE PAIN/ SWELLING, Disp: 180 tablet, Rfl: 3   omeprazole  (PRILOSEC) 40 MG capsule, Take 1 capsule (40 mg total) by mouth 2 (two) times daily., Disp: 180 capsule, Rfl: 4   pravastatin  (PRAVACHOL ) 40 MG tablet, TAKE 1 TABLET BY MOUTH EVERY DAY, Disp: 90 tablet, Rfl: 0   Semaglutide ,0.25 or 0.5MG /DOS, (OZEMPIC , 0.25 OR 0.5 MG/DOSE,) 2 MG/3ML SOPN, Inject 0.25 mg into the skin every 7 (seven) days., Disp: 9 mL, Rfl: 3  "

## 2024-07-28 LAB — LIPID PANEL
Chol/HDL Ratio: 1.8 ratio (ref 0.0–4.4)
Cholesterol, Total: 154 mg/dL (ref 100–199)
HDL: 85 mg/dL
LDL Chol Calc (NIH): 55 mg/dL (ref 0–99)
Triglycerides: 73 mg/dL (ref 0–149)
VLDL Cholesterol Cal: 14 mg/dL (ref 5–40)

## 2024-07-28 LAB — CMP14+EGFR
ALT: 15 IU/L (ref 0–32)
AST: 23 IU/L (ref 0–40)
Albumin: 4.4 g/dL (ref 3.8–4.8)
Alkaline Phosphatase: 98 IU/L (ref 49–135)
BUN/Creatinine Ratio: 17 (ref 12–28)
BUN: 14 mg/dL (ref 8–27)
Bilirubin Total: 0.2 mg/dL (ref 0.0–1.2)
CO2: 26 mmol/L (ref 20–29)
Calcium: 9.5 mg/dL (ref 8.7–10.3)
Chloride: 103 mmol/L (ref 96–106)
Creatinine, Ser: 0.81 mg/dL (ref 0.57–1.00)
Globulin, Total: 2.3 g/dL (ref 1.5–4.5)
Glucose: 127 mg/dL — ABNORMAL HIGH (ref 70–99)
Potassium: 4.8 mmol/L (ref 3.5–5.2)
Sodium: 146 mmol/L — ABNORMAL HIGH (ref 134–144)
Total Protein: 6.7 g/dL (ref 6.0–8.5)
eGFR: 73 mL/min/1.73

## 2024-07-28 LAB — VITAMIN D 25 HYDROXY (VIT D DEFICIENCY, FRACTURES): Vit D, 25-Hydroxy: 52.7 ng/mL (ref 30.0–100.0)

## 2024-07-28 LAB — MICROALBUMIN / CREATININE URINE RATIO
Creatinine, Urine: 15.8 mg/dL
Microalb/Creat Ratio: 60 mg/g{creat} — ABNORMAL HIGH (ref 0–29)
Microalbumin, Urine: 9.5 ug/mL

## 2024-08-04 ENCOUNTER — Other Ambulatory Visit: Payer: Self-pay | Admitting: Family Medicine

## 2024-08-04 DIAGNOSIS — E119 Type 2 diabetes mellitus without complications: Secondary | ICD-10-CM

## 2024-08-08 ENCOUNTER — Other Ambulatory Visit: Payer: Self-pay | Admitting: Family Medicine

## 2024-08-08 DIAGNOSIS — E119 Type 2 diabetes mellitus without complications: Secondary | ICD-10-CM

## 2025-05-02 ENCOUNTER — Ambulatory Visit
